# Patient Record
Sex: Female | Born: 1945
Health system: Southern US, Community
[De-identification: ages and names within clinical notes are randomized; demographics above are authoritative.]

## PROBLEM LIST (undated history)

## (undated) DIAGNOSIS — I2510 Atherosclerotic heart disease of native coronary artery without angina pectoris: Secondary | ICD-10-CM

## (undated) DIAGNOSIS — M1990 Unspecified osteoarthritis, unspecified site: Secondary | ICD-10-CM

## (undated) DIAGNOSIS — M51369 Other intervertebral disc degeneration, lumbar region without mention of lumbar back pain or lower extremity pain: Secondary | ICD-10-CM

## (undated) DIAGNOSIS — K222 Esophageal obstruction: Secondary | ICD-10-CM

## (undated) DIAGNOSIS — K449 Diaphragmatic hernia without obstruction or gangrene: Secondary | ICD-10-CM

## (undated) DIAGNOSIS — H269 Unspecified cataract: Secondary | ICD-10-CM

## (undated) DIAGNOSIS — D497 Neoplasm of unspecified behavior of endocrine glands and other parts of nervous system: Secondary | ICD-10-CM

## (undated) DIAGNOSIS — M5136 Other intervertebral disc degeneration, lumbar region: Secondary | ICD-10-CM

## (undated) DIAGNOSIS — S0285XA Fracture of orbit, unspecified, initial encounter for closed fracture: Secondary | ICD-10-CM

## (undated) DIAGNOSIS — E559 Vitamin D deficiency, unspecified: Secondary | ICD-10-CM

## (undated) DIAGNOSIS — F419 Anxiety disorder, unspecified: Secondary | ICD-10-CM

## (undated) DIAGNOSIS — M549 Dorsalgia, unspecified: Secondary | ICD-10-CM

## (undated) DIAGNOSIS — F32A Depression, unspecified: Secondary | ICD-10-CM

## (undated) DIAGNOSIS — K5790 Diverticulosis of intestine, part unspecified, without perforation or abscess without bleeding: Secondary | ICD-10-CM

## (undated) DIAGNOSIS — M8580 Other specified disorders of bone density and structure, unspecified site: Secondary | ICD-10-CM

## (undated) DIAGNOSIS — E785 Hyperlipidemia, unspecified: Secondary | ICD-10-CM

## (undated) DIAGNOSIS — C439 Malignant melanoma of skin, unspecified: Secondary | ICD-10-CM

## (undated) DIAGNOSIS — M5030 Other cervical disc degeneration, unspecified cervical region: Secondary | ICD-10-CM

## (undated) DIAGNOSIS — K219 Gastro-esophageal reflux disease without esophagitis: Secondary | ICD-10-CM

## (undated) DIAGNOSIS — R519 Headache, unspecified: Secondary | ICD-10-CM

## (undated) DIAGNOSIS — I10 Essential (primary) hypertension: Secondary | ICD-10-CM

## (undated) DIAGNOSIS — K649 Unspecified hemorrhoids: Secondary | ICD-10-CM

## (undated) HISTORY — DX: Other intervertebral disc degeneration, lumbar region without mention of lumbar back pain or lower extremity pain: M51.369

## (undated) HISTORY — DX: Diverticulosis of intestine, part unspecified, without perforation or abscess without bleeding: K57.90

## (undated) HISTORY — DX: Unspecified hemorrhoids: K64.9

## (undated) HISTORY — DX: Hyperlipidemia, unspecified: E78.5

## (undated) HISTORY — PX: FOOT SURGERY: SHX648

## (undated) HISTORY — DX: Essential (primary) hypertension: I10

## (undated) HISTORY — DX: Dorsalgia, unspecified: M54.9

## (undated) HISTORY — DX: Headache, unspecified: R51.9

## (undated) HISTORY — DX: Fracture of orbit, unspecified, initial encounter for closed fracture: S02.85XA

## (undated) HISTORY — DX: Neoplasm of unspecified behavior of endocrine glands and other parts of nervous system: D49.7

## (undated) HISTORY — DX: Gastro-esophageal reflux disease without esophagitis: K21.9

## (undated) HISTORY — DX: Anxiety disorder, unspecified: F41.9

## (undated) HISTORY — PX: OTHER SURGICAL HISTORY: SHX169

## (undated) HISTORY — DX: Diaphragmatic hernia without obstruction or gangrene: K44.9

## (undated) HISTORY — DX: Depression, unspecified: F32.A

## (undated) HISTORY — DX: Unspecified cataract: H26.9

## (undated) HISTORY — DX: Other intervertebral disc degeneration, lumbar region: M51.36

## (undated) HISTORY — DX: Malignant melanoma of skin, unspecified: C43.9

## (undated) HISTORY — DX: Other cervical disc degeneration, unspecified cervical region: M50.30

## (undated) HISTORY — DX: Esophageal obstruction: K22.2

## (undated) HISTORY — DX: Atherosclerotic heart disease of native coronary artery without angina pectoris: I25.10

## (undated) HISTORY — PX: TUBAL LIGATION: SHX77

## (undated) HISTORY — DX: Unspecified osteoarthritis, unspecified site: M19.90

## (undated) HISTORY — DX: Vitamin D deficiency, unspecified: E55.9

## (undated) HISTORY — DX: Other specified disorders of bone density and structure, unspecified site: M85.80

## (undated) HISTORY — PX: CERVICAL DISC SURGERY: SHX588

## (undated) HISTORY — PX: COLONOSCOPY: SHX174

---

## 1991-03-20 HISTORY — PX: VAGINAL HYSTERECTOMY: SUR661

## 1997-07-02 ENCOUNTER — Ambulatory Visit (HOSPITAL_COMMUNITY): Admission: RE | Admit: 1997-07-02 | Discharge: 1997-07-02 | Payer: Self-pay | Admitting: Neurosurgery

## 1997-08-04 ENCOUNTER — Other Ambulatory Visit: Admission: RE | Admit: 1997-08-04 | Discharge: 1997-08-04 | Payer: Self-pay | Admitting: Obstetrics and Gynecology

## 1997-08-25 ENCOUNTER — Ambulatory Visit (HOSPITAL_COMMUNITY): Admission: RE | Admit: 1997-08-25 | Discharge: 1997-08-25 | Payer: Self-pay | Admitting: Obstetrics and Gynecology

## 1998-01-20 ENCOUNTER — Encounter: Payer: Self-pay | Admitting: Neurosurgery

## 1998-01-20 ENCOUNTER — Ambulatory Visit (HOSPITAL_COMMUNITY): Admission: RE | Admit: 1998-01-20 | Discharge: 1998-01-20 | Payer: Self-pay | Admitting: Neurosurgery

## 1998-02-09 ENCOUNTER — Encounter: Payer: Self-pay | Admitting: Neurosurgery

## 1998-02-09 ENCOUNTER — Ambulatory Visit (HOSPITAL_COMMUNITY): Admission: RE | Admit: 1998-02-09 | Discharge: 1998-02-09 | Payer: Self-pay | Admitting: Neurosurgery

## 1998-02-24 ENCOUNTER — Encounter: Payer: Self-pay | Admitting: Neurosurgery

## 1998-02-24 ENCOUNTER — Ambulatory Visit (HOSPITAL_COMMUNITY): Admission: RE | Admit: 1998-02-24 | Discharge: 1998-02-24 | Payer: Self-pay | Admitting: Neurosurgery

## 1998-03-10 ENCOUNTER — Encounter: Payer: Self-pay | Admitting: Neurosurgery

## 1998-03-10 ENCOUNTER — Ambulatory Visit (HOSPITAL_COMMUNITY): Admission: RE | Admit: 1998-03-10 | Discharge: 1998-03-10 | Payer: Self-pay | Admitting: Neurosurgery

## 1998-08-18 ENCOUNTER — Encounter: Payer: Self-pay | Admitting: Neurosurgery

## 1998-08-18 ENCOUNTER — Ambulatory Visit (HOSPITAL_COMMUNITY): Admission: RE | Admit: 1998-08-18 | Discharge: 1998-08-18 | Payer: Self-pay | Admitting: Neurosurgery

## 1998-09-22 ENCOUNTER — Other Ambulatory Visit: Admission: RE | Admit: 1998-09-22 | Discharge: 1998-09-22 | Payer: Self-pay | Admitting: Obstetrics and Gynecology

## 1998-09-30 ENCOUNTER — Ambulatory Visit (HOSPITAL_COMMUNITY): Admission: RE | Admit: 1998-09-30 | Discharge: 1998-09-30 | Payer: Self-pay | Admitting: Obstetrics and Gynecology

## 1998-09-30 ENCOUNTER — Encounter: Payer: Self-pay | Admitting: Obstetrics and Gynecology

## 1999-06-11 ENCOUNTER — Encounter: Payer: Self-pay | Admitting: Neurosurgery

## 1999-06-11 ENCOUNTER — Ambulatory Visit (HOSPITAL_COMMUNITY): Admission: RE | Admit: 1999-06-11 | Discharge: 1999-06-11 | Payer: Self-pay | Admitting: Neurosurgery

## 1999-09-26 ENCOUNTER — Other Ambulatory Visit: Admission: RE | Admit: 1999-09-26 | Discharge: 1999-09-26 | Payer: Self-pay | Admitting: Obstetrics and Gynecology

## 1999-10-06 ENCOUNTER — Encounter: Payer: Self-pay | Admitting: Obstetrics and Gynecology

## 1999-10-06 ENCOUNTER — Ambulatory Visit (HOSPITAL_COMMUNITY): Admission: RE | Admit: 1999-10-06 | Discharge: 1999-10-06 | Payer: Self-pay | Admitting: Obstetrics and Gynecology

## 1999-12-12 ENCOUNTER — Ambulatory Visit (HOSPITAL_COMMUNITY): Admission: RE | Admit: 1999-12-12 | Discharge: 1999-12-12 | Payer: Self-pay | Admitting: *Deleted

## 2000-07-03 ENCOUNTER — Ambulatory Visit (HOSPITAL_COMMUNITY): Admission: RE | Admit: 2000-07-03 | Discharge: 2000-07-03 | Payer: Self-pay | Admitting: Neurosurgery

## 2000-07-03 ENCOUNTER — Encounter: Payer: Self-pay | Admitting: Neurosurgery

## 2000-07-09 ENCOUNTER — Ambulatory Visit (HOSPITAL_COMMUNITY): Admission: RE | Admit: 2000-07-09 | Discharge: 2000-07-09 | Payer: Self-pay | Admitting: Gastroenterology

## 2000-07-09 ENCOUNTER — Encounter: Payer: Self-pay | Admitting: Gastroenterology

## 2000-10-15 ENCOUNTER — Other Ambulatory Visit: Admission: RE | Admit: 2000-10-15 | Discharge: 2000-10-15 | Payer: Self-pay | Admitting: Obstetrics and Gynecology

## 2000-12-02 ENCOUNTER — Encounter: Payer: Self-pay | Admitting: Neurosurgery

## 2000-12-02 ENCOUNTER — Ambulatory Visit (HOSPITAL_COMMUNITY): Admission: RE | Admit: 2000-12-02 | Discharge: 2000-12-02 | Payer: Self-pay | Admitting: Neurosurgery

## 2001-01-23 ENCOUNTER — Ambulatory Visit (HOSPITAL_COMMUNITY): Admission: RE | Admit: 2001-01-23 | Discharge: 2001-01-23 | Payer: Self-pay | Admitting: Gastroenterology

## 2001-06-29 ENCOUNTER — Ambulatory Visit (HOSPITAL_COMMUNITY): Admission: RE | Admit: 2001-06-29 | Discharge: 2001-06-29 | Payer: Self-pay | Admitting: Neurosurgery

## 2001-06-29 ENCOUNTER — Encounter: Payer: Self-pay | Admitting: Neurosurgery

## 2001-09-04 ENCOUNTER — Encounter: Payer: Self-pay | Admitting: Gastroenterology

## 2001-09-04 ENCOUNTER — Ambulatory Visit (HOSPITAL_COMMUNITY): Admission: RE | Admit: 2001-09-04 | Discharge: 2001-09-04 | Payer: Self-pay | Admitting: Gastroenterology

## 2001-10-15 ENCOUNTER — Other Ambulatory Visit: Admission: RE | Admit: 2001-10-15 | Discharge: 2001-10-15 | Payer: Self-pay | Admitting: Obstetrics and Gynecology

## 2002-02-09 ENCOUNTER — Ambulatory Visit (HOSPITAL_COMMUNITY): Admission: RE | Admit: 2002-02-09 | Discharge: 2002-02-09 | Payer: Self-pay | Admitting: Gastroenterology

## 2002-02-09 ENCOUNTER — Encounter: Payer: Self-pay | Admitting: Gastroenterology

## 2002-02-13 ENCOUNTER — Ambulatory Visit (HOSPITAL_COMMUNITY): Admission: RE | Admit: 2002-02-13 | Discharge: 2002-02-13 | Payer: Self-pay | Admitting: Gastroenterology

## 2002-02-13 ENCOUNTER — Encounter: Payer: Self-pay | Admitting: Gastroenterology

## 2002-05-01 ENCOUNTER — Ambulatory Visit (HOSPITAL_COMMUNITY): Admission: RE | Admit: 2002-05-01 | Discharge: 2002-05-01 | Payer: Self-pay | Admitting: Neurosurgery

## 2002-05-01 ENCOUNTER — Encounter: Payer: Self-pay | Admitting: Neurosurgery

## 2002-06-24 ENCOUNTER — Encounter: Payer: Self-pay | Admitting: Neurosurgery

## 2002-06-24 ENCOUNTER — Ambulatory Visit (HOSPITAL_COMMUNITY): Admission: RE | Admit: 2002-06-24 | Discharge: 2002-06-24 | Payer: Self-pay | Admitting: Neurosurgery

## 2003-02-03 ENCOUNTER — Ambulatory Visit (HOSPITAL_COMMUNITY): Admission: RE | Admit: 2003-02-03 | Discharge: 2003-02-03 | Payer: Self-pay | Admitting: Obstetrics and Gynecology

## 2003-04-24 ENCOUNTER — Ambulatory Visit (HOSPITAL_COMMUNITY): Admission: RE | Admit: 2003-04-24 | Discharge: 2003-04-24 | Payer: Self-pay | Admitting: Neurosurgery

## 2003-05-26 ENCOUNTER — Emergency Department (HOSPITAL_COMMUNITY): Admission: EM | Admit: 2003-05-26 | Discharge: 2003-05-26 | Payer: Self-pay | Admitting: Emergency Medicine

## 2003-06-22 ENCOUNTER — Ambulatory Visit (HOSPITAL_COMMUNITY): Admission: RE | Admit: 2003-06-22 | Discharge: 2003-06-22 | Payer: Self-pay | Admitting: Neurosurgery

## 2004-01-17 ENCOUNTER — Other Ambulatory Visit: Admission: RE | Admit: 2004-01-17 | Discharge: 2004-01-17 | Payer: Self-pay | Admitting: Obstetrics and Gynecology

## 2004-02-07 ENCOUNTER — Ambulatory Visit: Payer: Self-pay | Admitting: Gastroenterology

## 2004-05-29 ENCOUNTER — Ambulatory Visit (HOSPITAL_COMMUNITY): Admission: RE | Admit: 2004-05-29 | Discharge: 2004-05-29 | Payer: Self-pay | Admitting: Neurosurgery

## 2004-12-20 ENCOUNTER — Ambulatory Visit (HOSPITAL_COMMUNITY): Admission: RE | Admit: 2004-12-20 | Discharge: 2004-12-20 | Payer: Self-pay | Admitting: Obstetrics and Gynecology

## 2005-02-22 ENCOUNTER — Other Ambulatory Visit: Admission: RE | Admit: 2005-02-22 | Discharge: 2005-02-22 | Payer: Self-pay | Admitting: Obstetrics and Gynecology

## 2005-03-23 ENCOUNTER — Ambulatory Visit: Payer: Self-pay | Admitting: Internal Medicine

## 2005-03-30 ENCOUNTER — Ambulatory Visit: Payer: Self-pay

## 2005-05-07 ENCOUNTER — Ambulatory Visit: Payer: Self-pay | Admitting: Gastroenterology

## 2005-05-09 ENCOUNTER — Encounter (INDEPENDENT_AMBULATORY_CARE_PROVIDER_SITE_OTHER): Payer: Self-pay | Admitting: Specialist

## 2005-05-09 ENCOUNTER — Ambulatory Visit (HOSPITAL_BASED_OUTPATIENT_CLINIC_OR_DEPARTMENT_OTHER): Admission: RE | Admit: 2005-05-09 | Discharge: 2005-05-09 | Payer: Self-pay | Admitting: Orthopedic Surgery

## 2005-05-14 ENCOUNTER — Ambulatory Visit: Payer: Self-pay | Admitting: Gastroenterology

## 2005-05-23 ENCOUNTER — Encounter: Admission: RE | Admit: 2005-05-23 | Discharge: 2005-05-23 | Payer: Self-pay | Admitting: Neurosurgery

## 2005-09-18 ENCOUNTER — Ambulatory Visit (HOSPITAL_BASED_OUTPATIENT_CLINIC_OR_DEPARTMENT_OTHER): Admission: RE | Admit: 2005-09-18 | Discharge: 2005-09-18 | Payer: Self-pay | Admitting: Orthopedic Surgery

## 2005-09-18 ENCOUNTER — Encounter (INDEPENDENT_AMBULATORY_CARE_PROVIDER_SITE_OTHER): Payer: Self-pay | Admitting: *Deleted

## 2006-01-10 ENCOUNTER — Encounter: Admission: RE | Admit: 2006-01-10 | Discharge: 2006-01-10 | Payer: Self-pay | Admitting: Neurosurgery

## 2006-02-27 ENCOUNTER — Other Ambulatory Visit: Admission: RE | Admit: 2006-02-27 | Discharge: 2006-02-27 | Payer: Self-pay | Admitting: Obstetrics and Gynecology

## 2006-06-14 ENCOUNTER — Encounter: Admission: RE | Admit: 2006-06-14 | Discharge: 2006-06-14 | Payer: Self-pay | Admitting: Neurosurgery

## 2006-09-16 ENCOUNTER — Ambulatory Visit: Payer: Self-pay | Admitting: Gastroenterology

## 2007-04-09 ENCOUNTER — Other Ambulatory Visit: Admission: RE | Admit: 2007-04-09 | Discharge: 2007-04-09 | Payer: Self-pay | Admitting: Obstetrics and Gynecology

## 2007-04-18 ENCOUNTER — Ambulatory Visit (HOSPITAL_COMMUNITY): Admission: RE | Admit: 2007-04-18 | Discharge: 2007-04-18 | Payer: Self-pay | Admitting: Obstetrics and Gynecology

## 2007-06-20 ENCOUNTER — Encounter: Admission: RE | Admit: 2007-06-20 | Discharge: 2007-06-20 | Payer: Self-pay | Admitting: Neurosurgery

## 2008-03-01 ENCOUNTER — Encounter: Admission: RE | Admit: 2008-03-01 | Discharge: 2008-03-01 | Payer: Self-pay | Admitting: Neurosurgery

## 2008-05-17 ENCOUNTER — Ambulatory Visit: Payer: Self-pay | Admitting: Obstetrics and Gynecology

## 2008-05-17 ENCOUNTER — Other Ambulatory Visit: Admission: RE | Admit: 2008-05-17 | Discharge: 2008-05-17 | Payer: Self-pay | Admitting: Obstetrics and Gynecology

## 2008-05-17 ENCOUNTER — Encounter: Payer: Self-pay | Admitting: Obstetrics and Gynecology

## 2008-11-10 ENCOUNTER — Telehealth: Payer: Self-pay | Admitting: Gastroenterology

## 2008-11-12 ENCOUNTER — Telehealth: Payer: Self-pay | Admitting: Gastroenterology

## 2009-01-11 ENCOUNTER — Ambulatory Visit: Payer: Self-pay | Admitting: Obstetrics and Gynecology

## 2009-01-11 ENCOUNTER — Ambulatory Visit: Payer: Self-pay | Admitting: Gastroenterology

## 2009-01-11 DIAGNOSIS — K219 Gastro-esophageal reflux disease without esophagitis: Secondary | ICD-10-CM

## 2009-01-11 HISTORY — DX: Gastro-esophageal reflux disease without esophagitis: K21.9

## 2009-04-19 ENCOUNTER — Encounter: Admission: RE | Admit: 2009-04-19 | Discharge: 2009-04-19 | Payer: Self-pay | Admitting: Neurosurgery

## 2009-07-12 ENCOUNTER — Ambulatory Visit: Payer: Self-pay | Admitting: Obstetrics and Gynecology

## 2009-07-12 ENCOUNTER — Other Ambulatory Visit: Admission: RE | Admit: 2009-07-12 | Discharge: 2009-07-12 | Payer: Self-pay | Admitting: Obstetrics and Gynecology

## 2009-12-29 ENCOUNTER — Ambulatory Visit (HOSPITAL_COMMUNITY): Admission: RE | Admit: 2009-12-29 | Discharge: 2009-12-29 | Payer: Self-pay | Admitting: Obstetrics and Gynecology

## 2010-01-12 ENCOUNTER — Encounter: Admission: RE | Admit: 2010-01-12 | Discharge: 2010-01-12 | Payer: Self-pay | Admitting: Neurosurgery

## 2010-03-19 HISTORY — PX: PELVIC LAPAROSCOPY: SHX162

## 2010-03-19 HISTORY — PX: OOPHORECTOMY: SHX86

## 2010-07-11 ENCOUNTER — Telehealth: Payer: Self-pay | Admitting: Gastroenterology

## 2010-07-11 ENCOUNTER — Other Ambulatory Visit: Payer: 59

## 2010-07-11 ENCOUNTER — Ambulatory Visit (INDEPENDENT_AMBULATORY_CARE_PROVIDER_SITE_OTHER): Payer: 59 | Admitting: Obstetrics and Gynecology

## 2010-07-11 DIAGNOSIS — D3910 Neoplasm of uncertain behavior of unspecified ovary: Secondary | ICD-10-CM

## 2010-07-11 DIAGNOSIS — R1032 Left lower quadrant pain: Secondary | ICD-10-CM

## 2010-07-11 DIAGNOSIS — N7013 Chronic salpingitis and oophoritis: Secondary | ICD-10-CM

## 2010-07-11 DIAGNOSIS — R1900 Intra-abdominal and pelvic swelling, mass and lump, unspecified site: Secondary | ICD-10-CM

## 2010-07-11 NOTE — Telephone Encounter (Signed)
Pt states that she saw her GYN today and he suggested that she have a colonoscopy. Pt states that she had an ultrasound done and they saw a "wall" and thought she should have the colon. Does the pt need to be scheduled for a direct colon or does she need an OV. Dr. Arlyce Dice please advise.

## 2010-07-18 NOTE — Telephone Encounter (Signed)
OV would be best 

## 2010-07-20 ENCOUNTER — Other Ambulatory Visit: Payer: 59

## 2010-07-20 ENCOUNTER — Ambulatory Visit (INDEPENDENT_AMBULATORY_CARE_PROVIDER_SITE_OTHER): Payer: Medicare Other | Admitting: Obstetrics and Gynecology

## 2010-07-20 DIAGNOSIS — R1904 Left lower quadrant abdominal swelling, mass and lump: Secondary | ICD-10-CM

## 2010-07-20 DIAGNOSIS — R1903 Right lower quadrant abdominal swelling, mass and lump: Secondary | ICD-10-CM

## 2010-07-20 DIAGNOSIS — N7013 Chronic salpingitis and oophoritis: Secondary | ICD-10-CM

## 2010-08-04 NOTE — Op Note (Signed)
NAMEPAOLA, ALESHIRE               ACCOUNT NO.:  [id number]   MEDICAL RECORD NO.:  [id number]          PATIENT TYPE:  AMB   LOCATION:  [facility]                         FACILITY:  [facility]   PHYSICIAN:  Marlowe Kays, M.D.  DATE OF BIRTH:  09-25-1945   DATE OF PROCEDURE:  09/18/2005  DATE OF DISCHARGE:                                 OPERATIVE REPORT   PREOPERATIVE DIAGNOSIS:  Recurrent ganglion, ulnar aspect, left wrist.   POSTOPERATIVE DIAGNOSIS:  Recurrent ganglion, ulnar aspect, left wrist.   OPERATION:  Excision of recurring ganglion, ulnar left wrist.   SURGEON:  Marlowe Kays, M.D.   ASSISTANT:  Nurse.   ANESTHESIA:  General.   PATHOLOGY AND JUSTIFICATION FOR PROCEDURE:  I had excised a mass in this  area on 05/09/2005.  At the time it did not have definite characteristics of  a ganglion and looked more like a seroma on top of a neuroma; however, the  path report came back ganglion cyst.  She has had a large recurrence,  confirmed by MRI with no definite stalk going into the joint.  Accordingly,  she is here today for surgical excision.   PROCEDURE:  Pneumatic tourniquet, left upper extremity was esmarched out  nonsterilely at 250 mmHg and the left upper extremity prepped from  midforearm to fingertips with DuraPrep and draped in sterile field.  I went  through the old surgical incision.  A large very characteristic looking  ganglion cyst this time was noted right below the subcutaneous tissue and  was carefully dissected out from surrounding tissues and subcutaneous tissue  dorsally was the dorsal sensory branch of the ulnar nerve which I identified  and protected.  Beneath the was a large cyst I protected the neurovascular  structures.  The cyst was gradually dissected out and excised at its base  and sent to pathology.  There appeared to be too apertures at 45 degrees to  one another, one going distally into intercarpal area and one more proximal  in probably what was  ulnar carpal joint.  After irrigating the wound well,  I sealed both these off tightly with interrupted 3-0 Vicryl.  I then used  the same suture for subcutaneous tissue with interrupted 4-0 nylon mattress  sutures in the skin.  The wound was irrigated and infiltrated with half  percent plain Marcaine prior to closure.  I then dressed it with Betadine  Adaptic and bulky compressive dressing with volar and dorsal splints.  The  tourniquet was released at the time of dressing application.  All fingers  were pink.  No operative complications.           ______________________________  Marlowe Kays, M.D.     JA/MEDQ  D:  09/18/2005  T:  09/18/2005  Job:  [id number]

## 2010-08-04 NOTE — Op Note (Signed)
Amanda [name], Amanda [name]               ACCOUNT NO.:  [id number]   MEDICAL RECORD NO.:  [id number]          PATIENT TYPE:  AMB   LOCATION:  [facility]                         FACILITY:  [facility]   PHYSICIAN:  Marlowe Kays, M.D.  DATE OF BIRTH:  [birth year]   DATE OF PROCEDURE:  05/09/2005  DATE OF DISCHARGE:                                 OPERATIVE REPORT   PREOPERATIVE DIAGNOSIS:  Suspected ganglion cyst ulnar left wrist.   POSTOPERATIVE DIAGNOSIS:  Mass ulnar left wrist, possible neuroma.   OPERATION:  Exploration left wrist with  partial excision of neuroma.   SURGEON:  Marlowe Kays, M.D.   ASSISTANT:  Nurse.   ANESTHESIA:  IV regional.   PATHOLOGY AND JUSTIFICATION FOR PROCEDURE:  She has a history of a wrist  fracture 2 years ago. Recently she has noted a knot on the ulnar aspect of  her left wrist. X-rays have been normal. I tried to aspirate removing a  little bit of gelatinous material but the mass has somewhat fluctuated in  size but has always ranged in the region of 1.5 to 3 cm and because of its  persistence she is here today for above mentioned surgery. See operative  description below for additional details.   DESCRIPTION OF PROCEDURE:  Satisfactory IV regional anesthesia, DuraPrep  from mid forearm to fingertips and was draped in a sterile field. I made a  midline incision over the ulnar carpal joint centered at the mass. This  should have been well away from both the ulnar nerve proper and the dorsal  sensory branch. Once through the subcutaneous, I was able to with a  combination of blunt and sharp dissection to go down to the mass. There  appeared to be a seroma on top of it with a lot of reactive tissue. There  was a band which entered into the mass and then went distally into the  lateral hand. Traction on this band could be seen to move up into the ulnar  forearm on the lateral surface. This appeared to have consistency of nerve  tissue. The congealed mass present  at the site of the lump I then opened  slightly with a knife blade, a little fluid came forth but the tissue  appeared to be solid. Accordingly, I felt that the best course would be to  debulk the neuroma preserving the ends on either side of the mass and await  the pathology report. There did not appear to be any definite communication  with the joint to close over. I infiltrated the soft tissues with 0.5% plain  Marcaine and then closed the subcutaneous tissue with interrupted 4-0 Vicryl  and skin with running 4-0 nylon. Betadine Adaptic dry sterile dressing and a  volar splint were applied. The tourniquet was released and at the time of  this dictation she was on her way to the recovery room in satisfactory  condition with no known complications.           ______________________________  Marlowe Kays, M.D.    JA/MEDQ  D:  05/09/2005  T:  05/10/2005  Job:  [id number] 

## 2010-08-21 ENCOUNTER — Encounter: Payer: Self-pay | Admitting: Gastroenterology

## 2010-08-21 ENCOUNTER — Ambulatory Visit (INDEPENDENT_AMBULATORY_CARE_PROVIDER_SITE_OTHER): Payer: 59 | Admitting: Gastroenterology

## 2010-08-21 DIAGNOSIS — R1310 Dysphagia, unspecified: Secondary | ICD-10-CM | POA: Insufficient documentation

## 2010-08-21 DIAGNOSIS — Z800 Family history of malignant neoplasm of digestive organs: Secondary | ICD-10-CM

## 2010-08-21 DIAGNOSIS — K219 Gastro-esophageal reflux disease without esophagitis: Secondary | ICD-10-CM

## 2010-08-21 HISTORY — DX: Family history of malignant neoplasm of digestive organs: Z80.0

## 2010-08-21 HISTORY — DX: Dysphagia, unspecified: R13.10

## 2010-08-21 NOTE — Assessment & Plan Note (Addendum)
Plan colonoscopy now and every five-year

## 2010-08-21 NOTE — Patient Instructions (Signed)
Colonoscopy A colonoscopy is an exam to evaluate your entire colon. In this exam, your colon is cleansed. A long fiberoptic tube is inserted through your rectum and into your colon. The fiberoptic scope (endoscope) is a long bundle of enclosed and very flexible fibers. These fibers transmit light to the area examined and send images from that area to your caregiver. Discomfort is usually minimal. You may be given a drug to help you sleep (sedative) during or prior to the procedure. This exam helps to detect lumps (tumors), polyps, inflammation, and areas of bleeding. Your caregiver may also take a small piece of tissue (biopsy) that will be examined under a microscope. BEFORE THE PROCEDURE  A clear liquid diet may be required for 2 days before the exam.   Liquid injections (enemas) or laxatives may be required.   A large amount of electrolyte solution may be given to you to drink over a short period of time. This solution is used to clean out your colon.   You should be present 1 prior to your procedure or as directed by your caregiver.   Check in at the admissions desk to fill out necessary forms if not preregistered. There will be consent forms to sign prior to the procedure. If accompanied by friends or family, there is a waiting area for them while you are having your procedure.  LET YOUR CAREGIVER KNOW ABOUT:  Allergies to food or medicine.  Medicines taken, including vitamins, herbs, eyedrops, over-the-counter medicines, and creams.   Use of steroids (by mouth or creams).   Previous problems with anesthetics or numbing medicines.   History of bleeding problems or blood clots.  Previous surgery.   Other health problems, including diabetes and kidney problems.   Possibility of pregnancy, if this applies.   AFTER THE PROCEDURE  If you received a sedative and/or pain medicine, you will need to arrange for someone to drive you home.   Occasionally, there is a little blood passed  with the first bowel movement. DO NOT be concerned.  HOME CARE INSTRUCTIONS  It is not unusual to pass moderate amounts of gas and experience mild abdominal cramping following the procedure. This is due to air being used to inflate your colon during the exam. Walking or a warm pack on your belly (abdomen) may help.   You may resume all normal meals and activities after sedatives and medicines have worn off.   Only take over-the-counter or prescription medicines for pain, discomfort, or fever as directed by your caregiver. DO NOT use aspirin or blood thinners if a biopsy was taken. Consult your caregiver for medicine usage if biopsies were taken.  FINDING OUT THE RESULTS OF YOUR TEST Not all test results are available during your visit. If your test results are not back during the visit, make an appointment with your caregiver to find out the results. Do not assume everything is normal if you have not heard from your caregiver or the medical facility. It is important for you to follow up on all of your test results. SEEK IMMEDIATE MEDICAL CARE IF:    You pass large blood clots or fill a toilet with blood following the procedure. This may also occur 10 to 14 days following the procedure. This is more likely if a biopsy was taken.   You develop abdominal pain that keeps getting worse and cannot be relieved with medicine.  Document Released: 03/02/2000 Document Re-Released: 05/30/2009 [facility] Patient Information 2011 [place], [place]. Please call back to schedule  your colonoscopy as recommended by Dr Lindell Spar will be scheduled for a  Previsit with a nurse prior to your procedure

## 2010-08-21 NOTE — Progress Notes (Signed)
History of Present Illness:  Mrs. Belfield has returned for followup of her reflux and stricture.  She has a history of an esophageal stricture.  She denies dysphagia. She remains on omeprazole. Since her last visit her sister developed and died from colon cancer. Last colonoscopy was 2002. She was having some left lower quadrant discomfort and was found to have ovarian cysts. She is scheduled for a hysterectomy and oophorectomy. She denies change in bowel habits, melena or hematochezia. Family history is also positive for polyps in her brother and sister.    Review of Systems: Pertinent positive and negative review of systems were noted in the above HPI section. All other review of systems were otherwise negative.    Current Medications, Allergies, Past Medical History, Past Surgical History, Family History and Social History were reviewed in [employer] electronic medical record  Vital signs were reviewed in today's medical record. Physical Exam: General: Well developed , well nourished, no acute distress Head: Normocephalic and atraumatic Eyes:  sclerae anicteric, EOMI Ears: Normal auditory acuity Mouth: No deformity or lesions Lungs: Clear throughout to auscultation Heart: Regular rate and rhythm; no murmurs, rubs or bruits Abdomen: Soft, non tender and non distended. No masses, hepatosplenomegaly or hernias noted. Normal Bowel sounds Rectal:deferred Musculoskeletal: Symmetrical with no gross deformities  Pulses:  Normal pulses noted Extremities: No clubbing, cyanosis, edema or deformities noted Neurological: Alert oriented x 4, grossly nonfocal Psychological:  Alert and cooperative. Normal mood and affect

## 2010-08-21 NOTE — Assessment & Plan Note (Signed)
Symptoms are well controlled with omeprazole.

## 2010-09-21 ENCOUNTER — Ambulatory Visit (AMBULATORY_SURGERY_CENTER): Payer: 59 | Admitting: *Deleted

## 2010-09-21 VITALS — Ht 65.0 in | Wt 158.6 lb

## 2010-09-21 DIAGNOSIS — Z800 Family history of malignant neoplasm of digestive organs: Secondary | ICD-10-CM

## 2010-09-21 MED ORDER — MOVIPREP 100 G PO SOLR: ORAL | Status: DC

## 2010-10-04 ENCOUNTER — Ambulatory Visit (AMBULATORY_SURGERY_CENTER): Payer: 59 | Admitting: Gastroenterology

## 2010-10-04 ENCOUNTER — Encounter: Payer: Self-pay | Admitting: Gastroenterology

## 2010-10-04 DIAGNOSIS — K5730 Diverticulosis of large intestine without perforation or abscess without bleeding: Secondary | ICD-10-CM

## 2010-10-04 DIAGNOSIS — Z800 Family history of malignant neoplasm of digestive organs: Secondary | ICD-10-CM

## 2010-10-04 DIAGNOSIS — Z1211 Encounter for screening for malignant neoplasm of colon: Secondary | ICD-10-CM

## 2010-10-04 MED ORDER — SODIUM CHLORIDE 0.9 % IV SOLN: 500.0000 mL | INTRAVENOUS | Status: DC

## 2010-10-04 NOTE — Patient Instructions (Addendum)
Diverticulosis Diverticulosis is a common condition that develops when small pouches (diverticula) form in the wall of the colon. The risk of diverticulosis increases with age. It happens more often in people who eat a low-fiber diet. Most individuals with diverticulosis have no symptoms. Those individuals with symptoms usually experience belly (abdominal) pain, constipation, or loose stools (diarrhea). HOME CARE INSTRUCTIONS  Increase the amount of fiber in your diet as directed by your caregiver or dietician. This may reduce symptoms of diverticulosis.   Your caregiver may recommend taking a dietary fiber supplement.   Drink at least 6 to 8 glasses of water each day to prevent constipation.   Try not to strain when you have a bowel movement.   Your caregiver may recommend avoiding nuts and seeds to prevent complications, although this is still an uncertain benefit.   Only take over-the-counter or prescription medicines for pain, discomfort, or fever as directed by your caregiver.  FOODS HAVING HIGH FIBER CONTENT INCLUDE:  Fruits. Apple, peach, pear, tangerine, raisins, prunes.   Vegetables. Brussels sprouts, asparagus, broccoli, cabbage, carrot, cauliflower, romaine lettuce, spinach, summer squash, tomato, winter squash, zucchini.   Starchy Vegetables. Baked beans, kidney beans, lima beans, split peas, lentils, potatoes (with skin).   Grains. Whole wheat bread, brown rice, bran flake cereal, plain oatmeal, white rice, shredded wheat, bran muffins.  SEEK IMMEDIATE MEDICAL CARE IF:  You develop increasing pain or severe bloating.   You have an oral temperature above 100, not controlled by medicine.   You develop vomiting or bowel movements that are bloody or black.  Document Released: 12/01/2003 Document Re-Released: 08/23/2009 [facility] Patient Information 2011 [place], [place].  Please review all discharge papers given to you by the recovery room nurse.  Mild diverticulosis in the  sigmoid colon was found by Dr Arlyce Dice today. He recommends a repeat colonoscopy in 5 years.  If you have problems after discharge please call [phone].  One of our nurses will call you in the am to see how you are doing and answer any questions you may have.  Thank you.

## 2010-10-05 ENCOUNTER — Telehealth: Payer: Self-pay | Admitting: *Deleted

## 2010-10-05 NOTE — Telephone Encounter (Signed)
No ID on voice mail.   No message left. 

## 2010-10-09 ENCOUNTER — Telehealth: Payer: Self-pay | Admitting: Obstetrics and Gynecology

## 2010-10-09 NOTE — Telephone Encounter (Addendum)
PATIENT IS READY TO SCHEDULE LAPAROSCOPIC BSO.  I AM ATTACHING OLD SURGERY SHEET AND ORDERS SHEET TO HARD COPY CHART  AS I AM EXPECTING YOU WILL SCHEDULE AT [facility]. ALSO, PATIENT SAID THAT SHE HAD COLONOSCOPY LAST WEEK AND EVERYTHING WAS FINE.   10-10-10 DR. GOTTSEGEN FILLED OUT SURGERY SLIP AND RETURNED IT TO ME. HE DID DICTATE IN DRAGON THAT HE WAS SENDING IT TO ME WITH ORDER BUT IT DID NOT SHOW UP IN MY INBASKET. I  WILL CONTACT PT AND PROCEED WITH SCHEDULING.

## 2010-10-11 ENCOUNTER — Encounter: Payer: Self-pay | Admitting: Obstetrics and Gynecology

## 2010-10-11 ENCOUNTER — Other Ambulatory Visit: Payer: Self-pay | Admitting: Obstetrics and Gynecology

## 2010-10-11 ENCOUNTER — Telehealth: Payer: Self-pay

## 2010-10-11 DIAGNOSIS — Z01818 Encounter for other preprocedural examination: Secondary | ICD-10-CM

## 2010-10-11 NOTE — Telephone Encounter (Signed)
Previously documented--error.

## 2010-10-11 NOTE — Telephone Encounter (Signed)
I called patient and informed her surgery scheduled for Friday, Aug 24 at the [facility].  She will check in 6:15am for 7:30am surgery.  She was instructed to be NPO after midnight on 11/09/10, to have someone to drive her home and to be with her for 24 hours after surgery while she recovers at home. She will come by [facility] the week of surgery on that M, Tu, or W to have her preop labwork done in the office lab. Our lab will fax results to [facility].  Paper orders were faxed to [facility] since they are not yet live with EMR.  [facility]

## 2010-10-27 NOTE — Progress Notes (Signed)
Addended by: Keenan Bachelor on: 10/27/2010 11:15 AM   Modules accepted: Orders

## 2010-11-08 ENCOUNTER — Ambulatory Visit (INDEPENDENT_AMBULATORY_CARE_PROVIDER_SITE_OTHER): Payer: 59 | Admitting: Gynecology

## 2010-11-08 DIAGNOSIS — Z01818 Encounter for other preprocedural examination: Secondary | ICD-10-CM

## 2010-11-08 DIAGNOSIS — Z79899 Other long term (current) drug therapy: Secondary | ICD-10-CM

## 2010-11-08 DIAGNOSIS — R823 Hemoglobinuria: Secondary | ICD-10-CM

## 2010-11-08 LAB — HEPATIC FUNCTION PANEL
ALT: <8 U/L (ref 0–35)
AST: 13 U/L (ref 0–37)
Bilirubin, Direct: 0.1 mg/dL (ref 0.0–0.3)
Indirect Bilirubin: 0.2 mg/dL (ref 0.0–0.9)
Total Bilirubin: 0.3 mg/dL (ref 0.3–1.2)

## 2010-11-10 ENCOUNTER — Other Ambulatory Visit: Payer: Self-pay | Admitting: Obstetrics and Gynecology

## 2010-11-10 ENCOUNTER — Ambulatory Visit (HOSPITAL_BASED_OUTPATIENT_CLINIC_OR_DEPARTMENT_OTHER)
Admission: RE | Admit: 2010-11-10 | Discharge: 2010-11-10 | Disposition: A | Discharge status: Home / Self Care | Payer: 59 | Source: Ambulatory Visit | Attending: Obstetrics and Gynecology | Admitting: Obstetrics and Gynecology

## 2010-11-10 DIAGNOSIS — R1900 Intra-abdominal and pelvic swelling, mass and lump, unspecified site: Secondary | ICD-10-CM

## 2010-11-10 DIAGNOSIS — K219 Gastro-esophageal reflux disease without esophagitis: Secondary | ICD-10-CM | POA: Insufficient documentation

## 2010-11-10 DIAGNOSIS — Z79899 Other long term (current) drug therapy: Secondary | ICD-10-CM | POA: Insufficient documentation

## 2010-11-10 DIAGNOSIS — E559 Vitamin D deficiency, unspecified: Secondary | ICD-10-CM | POA: Insufficient documentation

## 2010-11-10 DIAGNOSIS — N731 Chronic parametritis and pelvic cellulitis: Secondary | ICD-10-CM

## 2010-11-10 DIAGNOSIS — Z01812 Encounter for preprocedural laboratory examination: Secondary | ICD-10-CM | POA: Insufficient documentation

## 2010-11-10 DIAGNOSIS — N736 Female pelvic peritoneal adhesions (postinfective): Secondary | ICD-10-CM

## 2010-11-10 DIAGNOSIS — N7013 Chronic salpingitis and oophoritis: Secondary | ICD-10-CM | POA: Insufficient documentation

## 2010-11-10 DIAGNOSIS — N949 Unspecified condition associated with female genital organs and menstrual cycle: Secondary | ICD-10-CM | POA: Insufficient documentation

## 2010-11-10 DIAGNOSIS — Z01810 Encounter for preprocedural cardiovascular examination: Secondary | ICD-10-CM | POA: Insufficient documentation

## 2010-11-10 DIAGNOSIS — M949 Disorder of cartilage, unspecified: Secondary | ICD-10-CM | POA: Insufficient documentation

## 2010-11-10 DIAGNOSIS — M899 Disorder of bone, unspecified: Secondary | ICD-10-CM | POA: Insufficient documentation

## 2010-11-10 LAB — POCT HEMOGLOBIN-HEMACUE: Hemoglobin: 13.5 g/dL (ref 12.0–15.0)

## 2010-11-14 NOTE — Op Note (Signed)
[name], [name]               ACCOUNT NO.:  [id number]  MEDICAL RECORD NO.:  [id number]  LOCATION:                                 FACILITY:  PHYSICIAN:  Guenevere Roorda L. Elizaveta Mattice, M.D.DATE OF BIRTH:  [birth year]  DATE OF PROCEDURE:  11/10/2010 DATE OF DISCHARGE:                              OPERATIVE REPORT   PREOPERATIVE DIAGNOSES:  Chronic pelvic pain with chronic pelvic inflammatory disease with bilateral hydrosalpinx.  POSTOPERATIVE DIAGNOSES:  Chronic pelvic pain with chronic pelvic inflammatory disease with bilateral hydrosalpinx.  OPERATIONS:  Diagnostic laparoscopy with lyses of pelvic adhesions and bilateral salpingo-oophorectomy.  SURGEON:  Marquasia Schmieder L. Eda Paschal, MD  FIRST ASSISTANTGaetano Hawthorne. Lily Peer, MD  INDICATIONS:  The patient is a 65 year old female who has had repetitive episodes of pelvic inflammatory disease with fever, pain, elevated white count and need for antibiotics.  She persistently has had ultrasounds done showing bilateral hydrosalpinx.  We have continued to treat her conservatively but after her last reoccurrence, which was earlier this year, we have elected to proceed with surgery since she continues to get reinfected.  She now enters the hospital for laparoscopy with bilateral salpingo-oophorectomy.  FINDINGS:  At the time of laparoscopy, the patient had a large right hydrosalpinx of about 6 cm with an attached normal ovary.  The right side was not adherent to any other structures.  On the left side, however, there were dense adhesions involving the left ovary and the left hydrosalpinx to the broad ligament and to the mesentery of the sigmoid colon.  The ileocecal junction was identified.  The appendix could not be seen.  It appeared to probably be retrocecal.  PROCEDURE:  After adequate general anesthesia, the patient was placed in the dorsal lithotomy position, prepped and draped in the usual sterile manner.  A Foley catheter was inserted  into her bladder.  A sponge stick was placed in the vagina.  A subumbilical midline incision was made and through this the 10 mm diagnostic laparoscope, attached to an Optiview, was placed into the peritoneal cavity under direct visualization.  This was done atraumatically.  A pneumoperitoneum was created.  5 mm ports were placed in the right and left lower quadrant.  The next step was to take peritoneal washings even though this appeared to be a benign process.  After this was done, the right adnexa was elevated.  The ureter was identified, and using the Enseal for hemostasis and cutting, the right adnexa was completely separated from the round ligament and the top of the vaginal cuff and the IP ligament obviously was cauterized and cut for control of bleeding.  This was done without incident.  Attention was next turned to the left side.  Significant dissection was done both with a scissor, at one point with the Enseal, and at one point with hydrodissection, and now the left adnexa appeared to be free.  It was elevated.  The ureter was identified in the left side.  Using the Enseal, the attachments on the left including the infundibulopelvic ligament were cauterized and cut and now we had a separated left ovary and fallopian tube.  It was both the surgeon's and the assistant's impression that  part of the tube had been left.  At this point, further dissection was done on the left side.  Further adhesions were freed up using hydrodissection and the rest of the hydrosalpinx could be identified.  It was elevated and it could be removed atraumatically without any bleeding.  A 5-mm laparoscope was then placed in the left lower quadrant incision.  An Endopouch was placed subumbilically and all specimens were removed without difficulty.  Copious irrigation was done with sterile saline.  All fluid was removed.  There was no bleeding noted.  The pneumoperitoneum was evacuated.  All trocars were  removed. The subumbilical fascial incision was closed with interrupted 0 Vicryl. The skin of that wound was closed with 3-0 Monocryl and then Dermabond was used in the two lower 5-mm ports.  The Foley catheter was removed. It was still draining clear urine.  Estimated blood loss was minimal.  The patient tolerated the procedure well and left the operating room in satisfactory condition.     Davi Kroon L. Eda Paschal, M.D.     Tonette Bihari  D:  11/10/2010  T:  11/10/2010  Job:  [id number]  Electronically Signed by Edyth Gunnels M.D. on 11/14/2010 08:52:53 AM

## 2010-11-14 NOTE — H&P (Signed)
[name], [name]               ACCOUNT NO.:  [id number]  MEDICAL RECORD NO.:  [id number]  LOCATION:                               FACILITY:  [facility]  PHYSICIAN:  Daniel L. Gottsegen, M.D.DATE OF BIRTH:  Aug 15, 1945  DATE OF ADMISSION:  11/10/2010 DATE OF DISCHARGE:                             HISTORY & PHYSICAL   She is for the operating room at [facility] on Friday, November 10, 2010 at 7:30.  CHIEF COMPLAINT:  Symptomatic hydrosalpinx.  HISTORY OF PRESENT ILLNESS:  The patient is a 65 year old gravida 4, para 3-0-1-2, who has had a long history of intermittently symptomatic hydrosalpinx.  She has had been hospitalized previously with IV antibiotic therapy.  She has also been treated with oral antibiotic therapy on other occasions.  Her most recent event has occurred this spring.  She was treated with oral antibiotics with a symptomatic response and then her pain is much better.  Followup ultrasound, however, still shows bilateral hydrosalpinx of approximately 6-7 cm. She is status post vaginal hysterectomy done many years ago for fibroids.  She is tired of this persistent symptoms and reinfection of the hydrosalpinx.  She, therefore, enters the hospital for diagnostic laparoscopy with bilateral salpingo-oophorectomy.  She understands that she will no longer have her ovaries, but at this point is comfortable without them and is already on hormone replacement therapy.  She knows that there is some chance that she would require laparotomy.  PAST MEDICAL HISTORY:  Skin carcinoma of basal cell treated on her nose, hemorrhoid banding previously done, vaginal hysterectomy done for fibroids, previous tubal ligation done.  The patient is being treated for vitamin D deficiency as well.  She is also being treated for osteopenia.  She also takes Prilosec for GERD.  She also takes Ultram for arthritis.  SOCIAL HISTORY:  Noncontributory.  FAMILY HISTORY:  Mother and  paternal aunt with coronary artery disease. Brother and sister with diabetic.  Mother, father, brothers, and sisters with hypertension.  Sister and father with colon cancer.  Other sibling with melanoma.  SOCIAL HISTORY:  She is a nonsmoker, nondrinker.  REVIEW OF SYSTEMS:  HEENT:  Negative.  CARDIAC:  Negative.  RESPIRATORY: Negative.  GI:  Reveals a history of both GERD and hemorrhoids.  GU: Negative.  MUSCULOSKELETAL:  Reveals a history of back surgery.  She also has osteopenia, being treated with vitamin D.  ENDOCRINE: Negative.  PHYSICAL EXAMINATION:  GENERAL:  The patient is a well-developed, well- nourished female, in no acute distress. VITAL SIGNS:  Blood pressure 116/70; pulses 80 and regular; respirations 16, nonlabored.  She is afebrile. HEENT:  Within normal limits. NECK:  Supple.  Trachea in the midline.  Thyroid is not enlarged. LUNGS:  Clear to P and A. HEART:  No thrills, heaves, or murmurs. BREASTS:  No masses. ABDOMEN:  Soft without guarding, rebound, or masses. PELVIC:  External is normal.  BUS is normal.  Vaginal exam is normal. Cervix and uterus are absent bilateral, and rectal exam reveals bilateral small masses consistent with hydrosalpinx and confirmed on ultrasound. EXTREMITIES:  Within normal limits.  ADMISSION IMPRESSION:  Symptomatic bilateral hydrosalpinx.  PLAN:  Diagnostic laparoscopy with bilateral  salpingo-oophorectomy.     Daniel L. Eda Paschal, M.D.     Tonette Bihari  D:  11/08/2010  T:  11/08/2010  Job:  [id number]  cc:   [facility]  Electronically Signed by Edyth Gunnels M.D. on 11/14/2010 08:51:10 AM

## 2010-11-16 ENCOUNTER — Encounter: Payer: Self-pay | Admitting: Obstetrics and Gynecology

## 2010-11-16 ENCOUNTER — Ambulatory Visit (INDEPENDENT_AMBULATORY_CARE_PROVIDER_SITE_OTHER): Payer: 59 | Admitting: Obstetrics and Gynecology

## 2010-11-16 VITALS — BP 120/74

## 2010-11-16 DIAGNOSIS — N801 Endometriosis of ovary: Secondary | ICD-10-CM

## 2010-11-16 DIAGNOSIS — N809 Endometriosis, unspecified: Secondary | ICD-10-CM | POA: Insufficient documentation

## 2010-11-16 DIAGNOSIS — N838 Other noninflammatory disorders of ovary, fallopian tube and broad ligament: Secondary | ICD-10-CM

## 2010-11-16 DIAGNOSIS — N802 Endometriosis of fallopian tube: Secondary | ICD-10-CM

## 2010-11-16 DIAGNOSIS — N9489 Other specified conditions associated with female genital organs and menstrual cycle: Secondary | ICD-10-CM

## 2010-11-16 NOTE — Progress Notes (Signed)
Patient came back today for a postoperative visit. Her pathology report revealed endometriosis and endosalpingiosis. She is doing well postoperatively but is complaining of some upper back pain and headache over the last several days. She is reinitiated her estrogen and only missed 2 days.   Abdomen is soft without masses guarding or rebound. Incisions are healing well. External and vaginal within normal limits. Vagina within normal limits. Bimanual negative. Assessment: Normal postoperative course.  Plan: Discussed in detail her endometriosis. She will sure this with her 2 daughters. Continue estradiol 1 mg daily. Discussed headache and back pain with PCP. If the problem persists we can consider adjusting her estrogen dose for the headaches. Patient to return in November for yearly visit. She will get a mammogram then also. She will aso get a bone density here then also.

## 2010-11-21 ENCOUNTER — Encounter: Payer: Self-pay | Admitting: Gynecology

## 2010-12-06 ENCOUNTER — Other Ambulatory Visit: Payer: Self-pay | Admitting: Obstetrics and Gynecology

## 2011-02-05 ENCOUNTER — Other Ambulatory Visit (HOSPITAL_COMMUNITY)
Admission: RE | Admit: 2011-02-05 | Discharge: 2011-02-05 | Disposition: A | Discharge status: Home / Self Care | Payer: 59 | Source: Ambulatory Visit | Attending: Obstetrics and Gynecology | Admitting: Obstetrics and Gynecology

## 2011-02-05 ENCOUNTER — Ambulatory Visit (INDEPENDENT_AMBULATORY_CARE_PROVIDER_SITE_OTHER): Payer: 59 | Admitting: Obstetrics and Gynecology

## 2011-02-05 ENCOUNTER — Encounter: Payer: Self-pay | Admitting: Obstetrics and Gynecology

## 2011-02-05 VITALS — BP 118/76 | Ht 65.0 in | Wt 154.0 lb

## 2011-02-05 DIAGNOSIS — M949 Disorder of cartilage, unspecified: Secondary | ICD-10-CM

## 2011-02-05 DIAGNOSIS — M549 Dorsalgia, unspecified: Secondary | ICD-10-CM | POA: Insufficient documentation

## 2011-02-05 DIAGNOSIS — M899 Disorder of bone, unspecified: Secondary | ICD-10-CM

## 2011-02-05 DIAGNOSIS — M8580 Other specified disorders of bone density and structure, unspecified site: Secondary | ICD-10-CM

## 2011-02-05 DIAGNOSIS — Z124 Encounter for screening for malignant neoplasm of cervix: Secondary | ICD-10-CM | POA: Insufficient documentation

## 2011-02-05 DIAGNOSIS — Z01419 Encounter for gynecological examination (general) (routine) without abnormal findings: Secondary | ICD-10-CM

## 2011-02-05 MED ORDER — ESTRADIOL 1 MG PO TABS: 1.0000 mg | ORAL_TABLET | Freq: Every day | ORAL | Status: DC

## 2011-02-05 NOTE — Progress Notes (Signed)
Patient came to see me today for her annual GYN exam. Since she had her ovaries and tubes removed the summer she has been pain-free. She had endometriosis and endosalpingosis. She is doing well on her oral estrogen. She try an estrogen patch previously and did not do well with that. She is due for both a mammogram and a bone density. She is having no vaginal bleeding. She is osteopenia without elevated FRAX risk. Her PCP just took her off vitamin D. She is having a lot of back pain and sees Dr. Ethelene Hal and just had an injection.  ROS: 12 system review done. See history of present illness. Also history of esophageal reflux.  HEENT: Within normal limits.  Kennon Portela present Neck: No masses. Supraclavicular lymph nodes: Not enlarged. Breasts: Examined in both sitting and lying position. Symmetrical without skin changes or masses. Abdomen: Soft no masses guarding or rebound. No hernias. Pelvic: External within normal limits. BUS within normal limits. Vaginal examination shows good estrogen effect, no cystocele enterocele or rectocele. Cervix and uterus absent. Adnexa within normal limits. Rectovaginal confirmatory. Extremities within normal limits.  Assessment: #1. Endometriosis #2. Endosalpingosis #3. Low bone mass  Plan: Continue estradiol 1 mg daily. Discussed estrogen patch the patient declined. [facility]

## 2011-02-22 ENCOUNTER — Ambulatory Visit (INDEPENDENT_AMBULATORY_CARE_PROVIDER_SITE_OTHER): Payer: 59

## 2011-02-22 DIAGNOSIS — M949 Disorder of cartilage, unspecified: Secondary | ICD-10-CM

## 2011-02-22 DIAGNOSIS — M8580 Other specified disorders of bone density and structure, unspecified site: Secondary | ICD-10-CM

## 2011-04-10 ENCOUNTER — Other Ambulatory Visit: Payer: Self-pay | Admitting: Gastroenterology

## 2012-02-11 ENCOUNTER — Encounter: Payer: 59 | Admitting: Obstetrics and Gynecology

## 2012-03-05 ENCOUNTER — Ambulatory Visit (INDEPENDENT_AMBULATORY_CARE_PROVIDER_SITE_OTHER): Payer: 59 | Admitting: Obstetrics and Gynecology

## 2012-03-05 ENCOUNTER — Encounter: Payer: Self-pay | Admitting: Obstetrics and Gynecology

## 2012-03-05 VITALS — BP 120/76 | Ht 65.0 in | Wt 162.0 lb

## 2012-03-05 DIAGNOSIS — C439 Malignant melanoma of skin, unspecified: Secondary | ICD-10-CM | POA: Insufficient documentation

## 2012-03-05 DIAGNOSIS — D219 Benign neoplasm of connective and other soft tissue, unspecified: Secondary | ICD-10-CM | POA: Insufficient documentation

## 2012-03-05 DIAGNOSIS — M8580 Other specified disorders of bone density and structure, unspecified site: Secondary | ICD-10-CM | POA: Insufficient documentation

## 2012-03-05 DIAGNOSIS — K219 Gastro-esophageal reflux disease without esophagitis: Secondary | ICD-10-CM | POA: Insufficient documentation

## 2012-03-05 DIAGNOSIS — I10 Essential (primary) hypertension: Secondary | ICD-10-CM | POA: Insufficient documentation

## 2012-03-05 DIAGNOSIS — K5790 Diverticulosis of intestine, part unspecified, without perforation or abscess without bleeding: Secondary | ICD-10-CM | POA: Insufficient documentation

## 2012-03-05 DIAGNOSIS — K649 Unspecified hemorrhoids: Secondary | ICD-10-CM | POA: Insufficient documentation

## 2012-03-05 DIAGNOSIS — K222 Esophageal obstruction: Secondary | ICD-10-CM | POA: Insufficient documentation

## 2012-03-05 DIAGNOSIS — M1990 Unspecified osteoarthritis, unspecified site: Secondary | ICD-10-CM | POA: Insufficient documentation

## 2012-03-05 DIAGNOSIS — K449 Diaphragmatic hernia without obstruction or gangrene: Secondary | ICD-10-CM | POA: Insufficient documentation

## 2012-03-05 DIAGNOSIS — Z01419 Encounter for gynecological examination (general) (routine) without abnormal findings: Secondary | ICD-10-CM

## 2012-03-05 MED ORDER — ESTRADIOL 1 MG PO TABS: 1.0000 mg | ORAL_TABLET | Freq: Every day | ORAL | Status: DC

## 2012-03-05 NOTE — Patient Instructions (Signed)
Schedule mammogram.

## 2012-03-05 NOTE — Progress Notes (Signed)
Patient came to see me today for her annual GYN exam. She remains on oral estrogen for control of menopausal symptoms. She had previously tried an estrogen patch and did not do well with it. In 1993 she had a vaginal hysterectomy for fibroids. In 2012 she had diagnostic laparoscopy with bilateral salpingo-oophorectomy for endometriosis and endosalpingiosis and pelvic pain. She now has no pelvic pain. She has no vaginal bleeding. Her last bone density was one year ago and showed low bone mass without an elevated fracture risk. She is due for her mammogram. She has always had normal Pap smears. Her last Pap smear was 2012.  HEENT: Within normal limits.Amanda [name] present. Neck: No masses. Supraclavicular lymph nodes: Not enlarged. Breasts: Examined in both sitting and lying position. Symmetrical without skin changes or masses. Abdomen: Soft no masses guarding or rebound. No hernias. Pelvic: External within normal limits. BUS within normal limits. Vaginal examination shows good estrogen effect, no cystocele enterocele or rectocele. Cervix and uterus absent. Adnexa within normal limits. Rectovaginal confirmatory. Extremities within normal limits.  Assessment: Menopausal symptoms. History of endometriosis with endosalpingosis.  Plan: Mammogram. Continue estradiol 1 mg daily. Pap not done.The new Pap smear guidelines were discussed with the patient.

## 2012-03-07 ENCOUNTER — Other Ambulatory Visit: Payer: Self-pay | Admitting: Obstetrics and Gynecology

## 2012-03-14 ENCOUNTER — Other Ambulatory Visit: Payer: Self-pay | Admitting: *Deleted

## 2012-03-14 MED ORDER — ESTRADIOL 1 MG PO TABS: 1.0000 mg | ORAL_TABLET | Freq: Every day | ORAL | Status: DC

## 2012-05-15 ENCOUNTER — Other Ambulatory Visit: Payer: Self-pay | Admitting: Gastroenterology

## 2012-12-10 ENCOUNTER — Other Ambulatory Visit: Payer: Self-pay | Admitting: *Deleted

## 2012-12-10 MED ORDER — OMEPRAZOLE 40 MG PO CPDR: DELAYED_RELEASE_CAPSULE | ORAL | Status: DC

## 2013-01-05 ENCOUNTER — Telehealth: Payer: Self-pay | Admitting: *Deleted

## 2013-01-05 MED ORDER — ESTRADIOL 1 MG PO TABS: 1.0000 mg | ORAL_TABLET | Freq: Every day | ORAL | Status: DC

## 2013-01-05 NOTE — Telephone Encounter (Signed)
Pt has new insurance company with [employer] and will need her Rx for estradiol 1 mg sent to new pharmacy. rx will be sent.

## 2013-01-19 ENCOUNTER — Other Ambulatory Visit: Payer: Self-pay | Admitting: Neurosurgery

## 2013-01-29 ENCOUNTER — Other Ambulatory Visit: Payer: 59

## 2013-01-30 ENCOUNTER — Ambulatory Visit
Admission: RE | Admit: 2013-01-30 | Discharge: 2013-01-30 | Disposition: A | Discharge status: Home / Self Care | Payer: Medicare Other | Source: Ambulatory Visit | Attending: Neurosurgery | Admitting: Neurosurgery

## 2013-01-30 MED ORDER — GADOBENATE DIMEGLUMINE 529 MG/ML IV SOLN
7.0000 mL | Freq: Once | INTRAVENOUS | Status: AC | PRN
  Administered 2013-01-30: 7 mL via INTRAVENOUS

## 2013-03-06 ENCOUNTER — Encounter: Payer: Self-pay | Admitting: Gynecology

## 2013-04-09 ENCOUNTER — Ambulatory Visit (INDEPENDENT_AMBULATORY_CARE_PROVIDER_SITE_OTHER): Payer: Medicare Other | Admitting: Gynecology

## 2013-04-09 ENCOUNTER — Encounter: Payer: Self-pay | Admitting: Gynecology

## 2013-04-09 VITALS — BP 124/78 | Ht 65.0 in | Wt 160.0 lb

## 2013-04-09 DIAGNOSIS — N952 Postmenopausal atrophic vaginitis: Secondary | ICD-10-CM

## 2013-04-09 DIAGNOSIS — M949 Disorder of cartilage, unspecified: Secondary | ICD-10-CM

## 2013-04-09 DIAGNOSIS — N951 Menopausal and female climacteric states: Secondary | ICD-10-CM

## 2013-04-09 DIAGNOSIS — Z79890 Hormone replacement therapy (postmenopausal): Secondary | ICD-10-CM

## 2013-04-09 DIAGNOSIS — M8580 Other specified disorders of bone density and structure, unspecified site: Secondary | ICD-10-CM

## 2013-04-09 DIAGNOSIS — M899 Disorder of bone, unspecified: Secondary | ICD-10-CM

## 2013-04-09 NOTE — Progress Notes (Signed)
Amanda [name] 11-14-1945 [id number]        68 y.o.  [id number] for followup exam.  Former patient of [facility]. Several issues noted below.  Past medical history,surgical history, problem list, medications, allergies, family history and social history were all reviewed and documented in the EPIC chart.  ROS:  Performed and pertinent positives and negatives are included in the history, assessment and plan .  Exam: Kim assistant Filed Vitals:   04/09/13 0809  BP: 124/78  Height: 5\' 5"  (1.651 m)  Weight: 160 lb (72.576 kg)   General appearance  Normal Skin grossly normal Head/Neck normal with no cervical or supraclavicular adenopathy thyroid normal Lungs  clear Cardiac RR, without RMG Abdominal  soft, nontender, without masses, organomegaly or hernia Breasts  examined lying and sitting without masses, retractions, discharge or axillary adenopathy. Pelvic  Ext/BUS/vagina  Normal with generalized atrophic changes   Adnexa  Without masses or tenderness    Anus and perineum  Normal   Rectovaginal  Normal sphincter tone without palpated masses or tenderness.    Assessment/Plan:  68 y.o. [id number] female for followup exam.   1. Postmenopausal/hormone replacement therapy. Status post TVH for leiomyoma/bleeding. Subsequent BSO for hydrosalpinx. Patient is on estradiol 1 mg daily for hot flashes. Has done well with this.  I reviewed the whole issue of HRT with her to include the WHI study with increased risk of stroke, heart attack, DVT and breast cancer. The ACOG and NAMS statements for lowest dose for the shortest period of time reviewed. Transdermal versus oral first-pass effect benefit discussed. Issues as to when to wean discussed. I recommended she go to 0.5 mg estradiol see how she does. Ultimately weaned off if she tolerates this. She did try the patch before but had difficulties with this and started the oral medication. She has worsening symptoms after she weans that she will continue at her  choice. 2. Osteopenia. DEXA 02/2011 with T score -1.9. FRAX 8.2%/0.7%. Repeat DEXA now. Calcium vitamin D recommendations reviewed. 3. Pap smear 2012. No Pap smear done today. No history of abnormal Pap smears previously. Reviewed current screening guidelines. 67 with history of hysterectomy for benign indications. We both agreed to stop screening now she is comfortable with this. 4. Mammography 2011. Patient understands she is way overdue and agrees to schedule. Benefits of early detection reviewed. SBE monthly reviewed. 5. Colonoscopy 2012. Repeat at their recommended interval. 6. Health maintenance. No blood work done as she reports this all done through her primary physician's office.   Note: This document was prepared with digital dictation and possible smart phrase technology. Any transcriptional errors that result from this process are unintentional.   Anastasio Auerbach MD, 8:42 AM 04/09/2013

## 2013-04-09 NOTE — Patient Instructions (Signed)
Try the lower dose of the estrogen as we discussed. Call me if you have any issues. Schedule your mammogram. Followup in one year for annual exam.

## 2013-04-10 LAB — URINALYSIS W MICROSCOPIC + REFLEX CULTURE
BACTERIA UA: NONE SEEN
Bilirubin Urine: NEGATIVE
Casts: NONE SEEN
Crystals: NONE SEEN
Glucose, UA: NEGATIVE mg/dL
Hgb urine dipstick: NEGATIVE
KETONES UR: NEGATIVE mg/dL
Leukocytes, UA: NEGATIVE
Nitrite: NEGATIVE
Protein, ur: NEGATIVE mg/dL
Squamous Epithelial / LPF: NONE SEEN
UROBILINOGEN UA: 0.2 mg/dL (ref 0.0–1.0)
pH: 6 (ref 5.0–8.0)

## 2013-04-24 ENCOUNTER — Encounter: Payer: Self-pay | Admitting: Obstetrics and Gynecology

## 2013-05-27 ENCOUNTER — Other Ambulatory Visit: Payer: Self-pay

## 2013-05-27 MED ORDER — ESTRADIOL 1 MG PO TABS: 1.0000 mg | ORAL_TABLET | Freq: Every day | ORAL | Status: DC

## 2013-05-27 NOTE — Telephone Encounter (Signed)
I recommended she go to 0.5 mg estradiol see how she does. Ultimately weaned off if she tolerates this. She did try the patch before but had difficulties with this and started the oral medication. She has worsening symptoms after she weans that she will continue at her choice.

## 2013-11-14 ENCOUNTER — Encounter: Payer: Self-pay | Admitting: Gastroenterology

## 2013-12-04 ENCOUNTER — Telehealth: Payer: Self-pay | Admitting: Cardiology

## 2013-12-04 NOTE — Telephone Encounter (Signed)
Spoke with pt, per her report she went to the ER for anxiety and panic attack. Her bp was elevated and they admitted her, she was d/c on Tuesday. She saw dr Caprice Beaver yesterday and was told she needed a stress test due to the changes on her EKG. Explained to pt will need to get the records from [place] and show those to dr [name] before anything can be scheduled. Also explained to pt dr [name] may not want to schedule stress prior to seeing her. Patient voiced understanding and reported dr Caprice Beaver would do the stress test if we could not. Pt agreed with [place] getting the records and dr [name] reviewing before scheduling. Record release sent to [place]

## 2013-12-04 NOTE — Telephone Encounter (Signed)
Pt is scheduled to see Dr [name] on 01-04-14. She saw her primary care doctor yesterday and he thinks she needs a stress test before 01-04-14. Please call.

## 2013-12-07 NOTE — Telephone Encounter (Signed)
Discussed with dr [name], records from [place] reviewed. Explained to pt we can not order stress testing prior to seeing her. She will contact her medical doctor for scheduling stress test.

## 2014-01-04 ENCOUNTER — Encounter: Payer: Self-pay | Admitting: *Deleted

## 2014-01-04 ENCOUNTER — Encounter: Payer: Self-pay | Admitting: Cardiology

## 2014-01-04 ENCOUNTER — Ambulatory Visit (INDEPENDENT_AMBULATORY_CARE_PROVIDER_SITE_OTHER): Payer: Medicare Other | Admitting: Cardiology

## 2014-01-04 VITALS — BP 138/60 | HR 64 | Ht 65.5 in | Wt 164.6 lb

## 2014-01-04 DIAGNOSIS — I10 Essential (primary) hypertension: Secondary | ICD-10-CM

## 2014-01-04 DIAGNOSIS — R0600 Dyspnea, unspecified: Secondary | ICD-10-CM

## 2014-01-04 NOTE — Patient Instructions (Addendum)
Your physician recommends that you schedule a follow-up appointment in: [place] has requested that you have a stress echocardiogram. For further information please visit [email or web address]. Please follow instruction sheet as given.     Exercise Stress Echocardiogram An exercise stress echocardiogram is a heart (cardiac) test used to check the function of your heart. This test may also be called an exercise stress echocardiography or stress echo. This stress test will check how well your heart muscle and valves are working and determine if your heart muscle is getting enough blood. You will exercise on a treadmill to naturally increase or stress the functioning of your heart.  An echocardiogram uses sound waves (ultrasound) to produce an image of your heart. If your heart does not work normally, it may indicate coronary artery disease with poor coronary blood supply. The coronary arteries are the arteries that bring blood and oxygen to your heart. LET [facility] CARE PROVIDER KNOW ABOUT:  Any allergies you have.  All medicines you are taking, including vitamins, herbs, eye drops, creams, and over-the-counter medicines.  Previous problems you or members of your family have had with the use of anesthetics.  Any blood disorders you have.  Previous surgeries you have had.  Medical conditions you have.  Possibility of pregnancy, if this applies. RISKS AND COMPLICATIONS Generally, this is a safe procedure. However, as with any procedure, complications can occur. Possible complications can include:  You develop pain or pressure in the following areas:  Chest.  Jaw or neck.  Between your shoulder blades.  Radiating down your left arm.  Dizziness or lightheadedness.  Shortness of breath.  Increased or irregular heartbeat.  Nausea or vomiting.  Heart attack (rare). BEFORE THE PROCEDURE  Avoid all forms of caffeine for 24 hours before your test  or as directed by your health care provider. This includes coffee, tea (even decaffeinated tea), caffeinated sodas, chocolate, cocoa, and certain pain medicines.  Follow your health care provider's instructions regarding eating and drinking before the test.  Take your medicines as directed at regular times with water unless instructed otherwise. Exceptions may include:  If you have diabetes, ask how you are to take your insulin or pills. It is common to adjust insulin dosing the morning of the test.  If you are taking beta-blocker medicines, it is important to talk to your health care provider about these medicines well before the date of your test. Taking beta-blocker medicines may interfere with the test. In some cases, these medicines need to be changed or stopped 24 hours or more before the test.  If you wear a nitroglycerin patch, it may need to be removed prior to the test. Ask your health care provider if the patch should be removed before the test.  If you use an inhaler for any breathing condition, bring it with you to the test.  If you are an outpatient, bring a snack so you can eat right after the stress phase of the test.  Do not smoke for 4 hours prior to the test or as directed by your health care provider.  Wear loose-fitting clothes and comfortable shoes for the test. This test involves walking on a treadmill. PROCEDURE   Multiple electrodes will be put on your chest. If needed, small areas of your chest may be shaved to get better contact with the electrodes. Once the electrodes are attached to your body, multiple wires will be attached to the electrodes, and your heart rate  will be monitored.  You will have an echocardiogram done at rest.  To produce this image of your heart, gel is applied to your chest, and a wand-like tool (transducer) is moved over the chest. The transducer sends the sound waves through the chest to create the moving images of your heart.  You may  need an IV to receive a medication that improves the quality of the pictures.  You will then walk on a treadmill. The treadmill will be started at a slow pace. The treadmill speed and incline will gradually be increased to raise your heart rate.  At the peak of exercise, the treadmill will be stopped. You will lie down immediately on a bed so that a second echocardiogram can be done to visualize your heart's motion with exercise.  The test usually takes 30-60 minutes to complete. AFTER THE PROCEDURE  Your heart rate and blood pressure will be monitored after the test.  You may return to your normal schedule, including diet, activities, and medicines, unless your health care provider tells you otherwise. Document Released: 03/09/2004 Document Revised: 03/10/2013 Document Reviewed: 11/10/2012 [facility] Patient Information 2015 [place], [place]. This information is not intended to replace advice given to you by your health care provider. Make sure you discuss any questions you have with your health care provider.

## 2014-01-04 NOTE — Assessment & Plan Note (Addendum)
Blood pressure is controlled. Continue present medications. Note she has recurrent symptoms of acute dyspnea and elevated blood pressure we could consider workup for renal artery stenosis or pheochromocytoma.

## 2014-01-04 NOTE — Assessment & Plan Note (Signed)
Patient symptoms have resolved since she was hospitalized. Her electrocardiogram showed nonspecific ST changes. We will arrange a stress echocardiogram to screen for ischemia and quantitate LV function.

## 2014-01-04 NOTE — Progress Notes (Signed)
HPI: 68 year old female for evaluation of dyspnea. Patient has mild dyspnea on exertion but no orthopnea, PND, pedal edema, exertional chest pain, palpitations or syncope. In September while sitting at home she experienced several episodes of sudden dyspnea associated with a warm feeling. No chest pain or palpitations. No syncope. She had to go outside to catch her breath. She was taken to the emergency room [facility]. She was admitted and ruled out for myocardial infarction with serial enzymes. She was felt to have a panic attack. She was asked to follow up with cardiology for possible stress test. Note her blood pressure was elevated at admission and she was mildly bradycardic. Her atenolol was decreased. TSH was normal at 3.58. Since she was discharged she has not had any recurrent symptoms.  Current Outpatient Prescriptions  Medication Sig Dispense Refill  . ALPRAZolam (XANAX) 0.5 MG tablet Take 0.25-0.5 mg by mouth 4 (four) times daily as needed for anxiety.      Marland Kitchen amLODipine (NORVASC) 5 MG tablet Take 1 tablet by mouth daily.      Marland Kitchen atenolol (TENORMIN) 25 MG tablet Take by mouth daily.      . baclofen (LIORESAL) 10 MG tablet Take 10 mg by mouth 3 (three) times daily as needed for muscle spasms.      Marland Kitchen estradiol (ESTRACE) 1 MG tablet Take 1 tablet (1 mg total) by mouth daily.  90 tablet  3  . KOREAN GINSENG PO Take 1 capsule by mouth 2 (two) times daily.      . meloxicam (MOBIC) 15 MG tablet Take 1 tablet by mouth daily.      Marland Kitchen omeprazole (PRILOSEC) 40 MG capsule Take 1 capsule daily  90 capsule  3  . oxyCODONE-acetaminophen (PERCOCET) 10-325 MG per tablet Take 1 tablet by mouth every 4-6 hours as needed.      . VOLTAREN 1 % GEL Apply 1 application topically daily as needed.       No current facility-administered medications for this visit.    No Known Allergies  Past Medical History  Diagnosis Date  . Stricture of esophagus   . Hiatal hernia   . GERD (gastroesophageal  reflux disease)   . Diverticulosis   . Hemorrhoids   . Osteopenia   . Vitamin D deficiency   . Arthritis   . Hypertension   . Back pain   . Melanoma   . Hyperlipidemia     Past Surgical History  Procedure Laterality Date  . Back surgery      x 2  . Vaginal hysterectomy  1993  . Excision of skin cancer      Melanoma  . Hemorrhoid banding      X3  . Tubal ligation    . Pelvic laparoscopy  2012    Diag Lap-BSO-lysis of adhesions  . Oophorectomy  2012    BSO  . Foot surgery      History   Social History  . Marital Status: Married    Spouse Name: N/A    Number of Children: 3  . Years of Education: N/A   Occupational History  . Disabled    Social History Main Topics  . Smoking status: Former Smoker    Quit date: 03/19/1994  . Smokeless tobacco: Not on file  . Alcohol Use: No  . Drug Use: No  . Sexual Activity: Yes    Birth Control/ Protection: Surgical   Other Topics Concern  . Not on file   Social  History Narrative  . No narrative on file    Family History  Problem Relation Age of Onset  . Heart disease Mother   . Hypertension Mother   . Stroke Mother   . Colon polyps Father   . Heart disease Father     CHF  . Hypertension Father   . Cancer Father     COLON  . Colon cancer Sister   . Colon polyps Sister   . Heart disease Sister   . Diabetes Sister   . Melanoma Sister   . Colon polyps Brother   . Diabetes Brother   . Heart disease Brother   . Cancer Brother     COLON  . Melanoma Brother   . Melanoma Brother     ROS: Back pain but no fevers or chills, productive cough, hemoptysis, dysphasia, odynophagia, melena, hematochezia, dysuria, hematuria, rash, seizure activity, orthopnea, PND, pedal edema, claudication. Remaining systems are negative.  Physical Exam:   Blood pressure 138/60, pulse 64, height 5' 5.5" (1.664 m), weight 164 lb 9.6 oz (74.662 kg).  General:  Well developed/well nourished in NAD Skin warm/dry Patient not  depressed No peripheral clubbing Back-normal HEENT-normal/normal eyelids Neck supple/normal carotid upstroke bilaterally; no bruits; no JVD; no thyromegaly chest - CTA/ normal expansion CV - RRR/normal S1 and S2; no murmurs, rubs or gallops;  PMI nondisplaced Abdomen -NT/ND, no HSM, no mass, + bowel sounds, no bruit 2+ femoral pulses, no bruits Ext-no edema, chords, 2+ DP Neuro-grossly nonfocal  ECG Sinus rhythm with nonspecific ST changes.

## 2014-01-13 ENCOUNTER — Ambulatory Visit (HOSPITAL_COMMUNITY): Payer: Medicare Other | Attending: Internal Medicine

## 2014-01-13 DIAGNOSIS — I10 Essential (primary) hypertension: Secondary | ICD-10-CM | POA: Insufficient documentation

## 2014-01-13 DIAGNOSIS — R0602 Shortness of breath: Secondary | ICD-10-CM | POA: Diagnosis not present

## 2014-01-13 DIAGNOSIS — R0600 Dyspnea, unspecified: Secondary | ICD-10-CM

## 2014-01-13 DIAGNOSIS — I25119 Atherosclerotic heart disease of native coronary artery with unspecified angina pectoris: Secondary | ICD-10-CM

## 2014-01-13 NOTE — Progress Notes (Signed)
Stress Echocardiogram performed.  

## 2014-01-18 ENCOUNTER — Encounter: Payer: Self-pay | Admitting: Cardiology

## 2014-01-18 NOTE — Telephone Encounter (Signed)
This encounter was created in error - please disregard.

## 2014-01-18 NOTE — Telephone Encounter (Signed)
Pt is returning Debra's call from 10/30. Please call back

## 2014-01-18 NOTE — Telephone Encounter (Signed)
Calling about her results .Marland Kitchen Please call

## 2014-01-26 NOTE — Progress Notes (Signed)
      HPI: FU dyspnea. See previous notes for details. Stress echocardiogram October 2015 showed chest pain, electric cardiographic changes and possible anteroapical wall motion abnormality. Since last seen, She has mild dyspnea on exertion but no orthopnea, PND, pedal edema, syncope or chest pain.  Current Outpatient Prescriptions  Medication Sig Dispense Refill  . ALPRAZolam (XANAX) 0.5 MG tablet Take 0.25-0.5 mg by mouth 4 (four) times daily as needed for anxiety.    Marland Kitchen amLODipine (NORVASC) 5 MG tablet Take 1 tablet by mouth daily.    Marland Kitchen atenolol (TENORMIN) 25 MG tablet Take by mouth daily.    . baclofen (LIORESAL) 10 MG tablet Take 10 mg by mouth 3 (three) times daily as needed for muscle spasms.    Marland Kitchen estradiol (ESTRACE) 1 MG tablet Take 1 tablet (1 mg total) by mouth daily. 90 tablet 3  . KOREAN GINSENG PO Take 1 capsule by mouth 2 (two) times daily.    . meloxicam (MOBIC) 15 MG tablet Take 1 tablet by mouth daily.    Marland Kitchen omeprazole (PRILOSEC) 40 MG capsule Take 1 capsule daily 90 capsule 3  . oxyCODONE-acetaminophen (PERCOCET) 10-325 MG per tablet Take 1 tablet by mouth every 4-6 hours as needed.    . VOLTAREN 1 % GEL Apply 1 application topically daily as needed.     No current facility-administered medications for this visit.     Past Medical History  Diagnosis Date  . Stricture of esophagus   . Hiatal hernia   . GERD (gastroesophageal reflux disease)   . Diverticulosis   . Hemorrhoids   . Osteopenia   . Vitamin D deficiency   . Arthritis   . Hypertension   . Back pain   . Melanoma   . Hyperlipidemia   . Pituitary tumor     Past Surgical History  Procedure Laterality Date  . Back surgery      x 2  . Vaginal hysterectomy  1993  . Excision of skin cancer      Melanoma  . Hemorrhoid banding      X3  . Tubal ligation    . Pelvic laparoscopy  2012    Diag Lap-BSO-lysis of adhesions  . Oophorectomy  2012    BSO  . Foot surgery      History   Social History  .  Marital Status: Married    Spouse Name: N/A    Number of Children: 3  . Years of Education: N/A   Occupational History  . Disabled    Social History Main Topics  . Smoking status: Former Smoker    Quit date: 03/19/1994  . Smokeless tobacco: Not on file  . Alcohol Use: No  . Drug Use: No  . Sexual Activity: Yes    Birth Control/ Protection: Surgical   Other Topics Concern  . Not on file   Social History Narrative    ROS: no fevers or chills, productive cough, hemoptysis, dysphasia, odynophagia, melena, hematochezia, dysuria, hematuria, rash, seizure activity, orthopnea, PND, pedal edema, claudication. Remaining systems are negative.  Physical Exam: Well-developed well-nourished in no acute distress.  Skin is warm and dry.  HEENT is normal.  Neck is supple.  Chest is clear to auscultation with normal expansion.  Cardiovascular exam is regular rate and rhythm.  Abdominal exam nontender or distended. No masses palpated. Extremities show no edema. neuro grossly intact

## 2014-01-29 ENCOUNTER — Encounter: Payer: Self-pay | Admitting: Cardiology

## 2014-01-29 ENCOUNTER — Other Ambulatory Visit: Payer: Self-pay | Admitting: Cardiology

## 2014-01-29 ENCOUNTER — Encounter: Payer: Self-pay | Admitting: *Deleted

## 2014-01-29 ENCOUNTER — Ambulatory Visit (INDEPENDENT_AMBULATORY_CARE_PROVIDER_SITE_OTHER): Payer: Medicare Other | Admitting: Cardiology

## 2014-01-29 VITALS — BP 130/78 | HR 60 | Ht 65.0 in | Wt 162.0 lb

## 2014-01-29 DIAGNOSIS — I10 Essential (primary) hypertension: Secondary | ICD-10-CM

## 2014-01-29 DIAGNOSIS — R9439 Abnormal result of other cardiovascular function study: Secondary | ICD-10-CM | POA: Insufficient documentation

## 2014-01-29 DIAGNOSIS — R0600 Dyspnea, unspecified: Secondary | ICD-10-CM

## 2014-01-29 LAB — CBC
HEMATOCRIT: 37.9 % (ref 36.0–46.0)
HEMOGLOBIN: 12.8 g/dL (ref 12.0–15.0)
MCH: 31.9 pg (ref 26.0–34.0)
MCHC: 33.8 g/dL (ref 30.0–36.0)
MCV: 94.5 fL (ref 78.0–100.0)
Platelets: 191 10*3/uL (ref 150–400)
RBC: 4.01 MIL/uL (ref 3.87–5.11)
RDW: 12.9 % (ref 11.5–15.5)
WBC: 4.4 10*3/uL (ref 4.0–10.5)

## 2014-01-29 MED ORDER — ASPIRIN EC 81 MG PO TBEC: 81.0000 mg | DELAYED_RELEASE_TABLET | Freq: Every day | ORAL | Status: DC

## 2014-01-29 NOTE — Patient Instructions (Addendum)
Your physician recommends that you schedule a follow-up appointment in: [place] has requested that you have a cardiac catheterization. Cardiac catheterization is used to diagnose and/or treat various heart conditions. Doctors may recommend this procedure for a number of different reasons. The most common reason is to evaluate chest pain. Chest pain can be a symptom of coronary artery disease (CAD), and cardiac catheterization can show whether plaque is narrowing or blocking your heart's arteries. This procedure is also used to evaluate the valves, as well as measure the blood flow and oxygen levels in different parts of your heart. For further information please visit [email or web address]. Please follow instruction sheet, as give  Your physician recommends that you HAVE LAB WORK TODAY  START ASPIRIN 81 MG ONCE DAILY  Coronary Angiogram A coronary angiogram, also called coronary angiography, is an X-ray procedure used to look at the arteries in the heart. In this procedure, a dye (contrast dye) is injected through a long, hollow tube (catheter). The catheter is about the size of a piece of cooked spaghetti and is inserted through your groin, wrist, or arm. The dye is injected into each artery, and X-rays are then taken to show if there is a blockage in the arteries of your heart. LET [facility] CARE PROVIDER KNOW ABOUT:  Any allergies you have, including allergies to shellfish or contrast dye.   All medicines you are taking, including vitamins, herbs, eye drops, creams, and over-the-counter medicines.   Previous problems you or members of your family have had with the use of anesthetics.   Any blood disorders you have.   Previous surgeries you have had.  History of kidney problems or failure.   Other medical conditions you have. RISKS AND COMPLICATIONS  Generally, a coronary angiogram is a safe procedure. However, problems can occur and  include:  Allergic reaction to the dye.  Bleeding from the access site or other locations.  Kidney injury, especially in people with impaired kidney function.  Stroke (rare).  Heart attack (rare). BEFORE THE PROCEDURE   Do not eat or drink anything after midnight the night before the procedure or as directed by your health care provider.   Ask your health care provider about changing or stopping your regular medicines. This is especially important if you are taking diabetes medicines or blood thinners. PROCEDURE  You may be given a medicine to help you relax (sedative) before the procedure. This medicine is given through an intravenous (IV) access tube that is inserted into one of your veins.   The area where the catheter will be inserted will be washed and shaved. This is usually done in the groin but may be done in the fold of your arm (near your elbow) or in the wrist.   A medicine will be given to numb the area where the catheter will be inserted (local anesthetic).   The health care provider will insert the catheter into an artery. The catheter will be guided by using a special type of X-ray (fluoroscopy) of the blood vessel being examined.   A special dye will then be injected into the catheter, and X-rays will be taken. The dye will help to show where any narrowing or blockages are located in the heart arteries.  AFTER THE PROCEDURE   If the procedure is done through the leg, you will be kept in bed lying flat for several hours. You will be instructed to not bend or cross your legs.  The  insertion site will be checked frequently.   The pulse in your feet or wrist will be checked frequently.   Additional blood tests, X-rays, and an electrocardiogram may be done.  Document Released: 09/09/2002 Document Revised: 07/20/2013 Document Reviewed: 07/28/2012 [facility] Patient Information 2015 [place], [place]. This information is not intended to replace advice given to you  by your health care provider. Make sure you discuss any questions you have with your health care provider.

## 2014-01-29 NOTE — Assessment & Plan Note (Signed)
Stress echocardiogram is abnormal. We will plan catheterization as outlined.

## 2014-01-29 NOTE — Assessment & Plan Note (Signed)
Patient is noted to have electrocardiographic changes and anterior/apical wall motion abnormality on stress echocardiogram. We will proceed with cardiac catheterization for definitive evaluation. Risks and benefits were discussed and the patient agrees to proceed. Add aspirin 81 mg daily.

## 2014-01-29 NOTE — Assessment & Plan Note (Signed)
Blood pressure controlled. Continue present medications. 

## 2014-01-30 LAB — BASIC METABOLIC PANEL WITH GFR
BUN: 9 mg/dL (ref 6–23)
CHLORIDE: 108 meq/L (ref 96–112)
CO2: 27 mEq/L (ref 19–32)
Calcium: 8.3 mg/dL — ABNORMAL LOW (ref 8.4–10.5)
Creat: 0.82 mg/dL (ref 0.50–1.10)
GFR, EST AFRICAN AMERICAN: 86 mL/min
GFR, Est Non African American: 75 mL/min
Glucose, Bld: 70 mg/dL (ref 70–99)
POTASSIUM: 3.9 meq/L (ref 3.5–5.3)
SODIUM: 142 meq/L (ref 135–145)

## 2014-01-30 LAB — PROTIME-INR
INR: 0.98 (ref <1.50)
Prothrombin Time: 13 seconds (ref 11.6–15.2)

## 2014-02-03 ENCOUNTER — Ambulatory Visit (HOSPITAL_COMMUNITY)
Admission: RE | Admit: 2014-02-03 | Discharge: 2014-02-03 | Disposition: A | Discharge status: Home / Self Care | Payer: Medicare Other | Source: Ambulatory Visit | Attending: Cardiovascular Disease | Admitting: Cardiovascular Disease

## 2014-02-03 ENCOUNTER — Encounter (HOSPITAL_COMMUNITY)
Admission: RE | Disposition: A | Discharge status: Home / Self Care | Payer: Self-pay | Source: Ambulatory Visit | Attending: Cardiovascular Disease

## 2014-02-03 DIAGNOSIS — E785 Hyperlipidemia, unspecified: Secondary | ICD-10-CM | POA: Diagnosis not present

## 2014-02-03 DIAGNOSIS — M8580 Other specified disorders of bone density and structure, unspecified site: Secondary | ICD-10-CM | POA: Diagnosis not present

## 2014-02-03 DIAGNOSIS — K449 Diaphragmatic hernia without obstruction or gangrene: Secondary | ICD-10-CM | POA: Insufficient documentation

## 2014-02-03 DIAGNOSIS — I2510 Atherosclerotic heart disease of native coronary artery without angina pectoris: Secondary | ICD-10-CM | POA: Diagnosis not present

## 2014-02-03 DIAGNOSIS — K219 Gastro-esophageal reflux disease without esophagitis: Secondary | ICD-10-CM | POA: Insufficient documentation

## 2014-02-03 DIAGNOSIS — M1990 Unspecified osteoarthritis, unspecified site: Secondary | ICD-10-CM | POA: Insufficient documentation

## 2014-02-03 DIAGNOSIS — Z79899 Other long term (current) drug therapy: Secondary | ICD-10-CM | POA: Insufficient documentation

## 2014-02-03 DIAGNOSIS — K649 Unspecified hemorrhoids: Secondary | ICD-10-CM | POA: Insufficient documentation

## 2014-02-03 DIAGNOSIS — R9439 Abnormal result of other cardiovascular function study: Secondary | ICD-10-CM

## 2014-02-03 DIAGNOSIS — R079 Chest pain, unspecified: Secondary | ICD-10-CM | POA: Diagnosis present

## 2014-02-03 DIAGNOSIS — R0600 Dyspnea, unspecified: Secondary | ICD-10-CM

## 2014-02-03 DIAGNOSIS — Z87891 Personal history of nicotine dependence: Secondary | ICD-10-CM | POA: Diagnosis not present

## 2014-02-03 DIAGNOSIS — I10 Essential (primary) hypertension: Secondary | ICD-10-CM | POA: Insufficient documentation

## 2014-02-03 DIAGNOSIS — K5790 Diverticulosis of intestine, part unspecified, without perforation or abscess without bleeding: Secondary | ICD-10-CM | POA: Diagnosis not present

## 2014-02-03 HISTORY — PX: LEFT HEART CATHETERIZATION WITH CORONARY ANGIOGRAM: SHX5451

## 2014-02-03 SURGERY — LEFT HEART CATHETERIZATION WITH CORONARY ANGIOGRAM
Anesthesia: LOCAL

## 2014-02-03 MED ORDER — VERAPAMIL HCL 2.5 MG/ML IV SOLN
INTRAVENOUS | Status: AC
  Filled 2014-02-03: qty 2

## 2014-02-03 MED ORDER — SODIUM CHLORIDE 0.9 % IV SOLN: 1.0000 mL/kg/h | INTRAVENOUS | Status: DC

## 2014-02-03 MED ORDER — HEPARIN (PORCINE) IN NACL 2-0.9 UNIT/ML-% IJ SOLN
INTRAMUSCULAR | Status: AC
  Filled 2014-02-03: qty 1000

## 2014-02-03 MED ORDER — LIDOCAINE HCL (PF) 1 % IJ SOLN
INTRAMUSCULAR | Status: AC
  Filled 2014-02-03: qty 30

## 2014-02-03 MED ORDER — HEPARIN (PORCINE) IN NACL 2-0.9 UNIT/ML-% IJ SOLN
INTRAMUSCULAR | Status: AC
  Filled 2014-02-03: qty 500

## 2014-02-03 MED ORDER — ASPIRIN 81 MG PO CHEW: 81.0000 mg | CHEWABLE_TABLET | ORAL | Status: DC

## 2014-02-03 MED ORDER — NITROGLYCERIN 1 MG/10 ML FOR IR/CATH LAB
INTRA_ARTERIAL | Status: AC
  Filled 2014-02-03: qty 10

## 2014-02-03 MED ORDER — SODIUM CHLORIDE 0.9 % IJ SOLN: 3.0000 mL | Freq: Two times a day (BID) | INTRAMUSCULAR | Status: DC

## 2014-02-03 MED ORDER — OXYCODONE-ACETAMINOPHEN 5-325 MG PO TABS
1.0000 | ORAL_TABLET | Freq: Once | ORAL | Status: AC
  Administered 2014-02-03: 1 via ORAL
  Filled 2014-02-03: qty 1

## 2014-02-03 MED ORDER — SODIUM CHLORIDE 0.9 % IJ SOLN: 3.0000 mL | INTRAMUSCULAR | Status: DC | PRN

## 2014-02-03 MED ORDER — SODIUM CHLORIDE 0.9 % IV SOLN: 250.0000 mL | INTRAVENOUS | Status: DC | PRN

## 2014-02-03 MED ORDER — MIDAZOLAM HCL 2 MG/2ML IJ SOLN
INTRAMUSCULAR | Status: AC
  Filled 2014-02-03: qty 2

## 2014-02-03 MED ORDER — OXYCODONE-ACETAMINOPHEN 5-325 MG PO TABS
ORAL_TABLET | ORAL | Status: AC
  Administered 2014-02-03: 1 via ORAL
  Filled 2014-02-03: qty 1

## 2014-02-03 MED ORDER — FENTANYL CITRATE 0.05 MG/ML IJ SOLN
INTRAMUSCULAR | Status: AC
  Filled 2014-02-03: qty 2

## 2014-02-03 MED ORDER — HEPARIN SODIUM (PORCINE) 1000 UNIT/ML IJ SOLN
INTRAMUSCULAR | Status: AC
  Filled 2014-02-03: qty 1

## 2014-02-03 MED ORDER — SODIUM CHLORIDE 0.9 % IV SOLN
1.0000 mL/kg/h | INTRAVENOUS | Status: DC
  Administered 2014-02-03: 1 mL/kg/h via INTRAVENOUS

## 2014-02-03 MED ORDER — ACETAMINOPHEN 325 MG PO TABS: 650.0000 mg | ORAL_TABLET | ORAL | Status: DC | PRN

## 2014-02-03 MED ORDER — OXYCODONE-ACETAMINOPHEN 5-325 MG PO TABS
1.0000 | ORAL_TABLET | ORAL | Status: DC | PRN
  Filled 2014-02-03: qty 1

## 2014-02-03 MED ORDER — ONDANSETRON HCL 4 MG/2ML IJ SOLN: 4.0000 mg | Freq: Four times a day (QID) | INTRAMUSCULAR | Status: DC | PRN

## 2014-02-03 NOTE — CV Procedure (Signed)
    Cardiac Catheterization Procedure Note  Name: Amanda [name] MRN: [id number] DOB: October 05, 1945  Procedure: Left Heart Cath, Selective Coronary Angiography, LV angiography  Indication: Chest pain, dyspnea, abnormal stress echo.   Procedural Details: The right wrist was prepped, draped, and anesthetized with 1% lidocaine. Using the modified Seldinger technique, a 5/6 French Slender sheath was introduced into the right radial artery. 3 mg of verapamil was administered through the sheath, weight-based unfractionated heparin was administered intravenously. Standard Judkins catheters were used for selective coronary angiography and left ventriculography. Catheter exchanges were performed over an exchange length guidewire. There were no immediate procedural complications. A TR band was used for radial hemostasis at the completion of the procedure.  The patient was transferred to the post catheterization recovery area for further monitoring.  Procedural Findings: Hemodynamics: AO 152/65 LV 156/13  Coronary angiography: Coronary dominance: left  Left mainstem: Short segment, widely patent  Left anterior descending (LAD): Patent, large caliber vessel to the LV apex. Patent diagonals. The mid-LAD has mild diffuse nonobstructive plaque.  Left circumflex (LCx): Large, dominant vessel, no obstructive disease. The OM branches are patent.   Right coronary artery (RCA): Small, nondominant vessel. There is a 70% stenosis in the mid-vessel before the origin of a small RV marginal branch  Left ventriculography: Left ventricular systolic function is normal, LVEF is estimated at 55-65%, there is no significant mitral regurgitation   Estimated Blood Loss: minimal  Final Conclusions:   1. Moderate stenosis of a small nondominant RCA 2. Widely patent left main, LAD, and dominant left circumflex 3. Normal LV function  Recommendations: medical therapy.  Sherren Mocha MD, [facility] 02/03/2014, 11:26 AM

## 2014-02-03 NOTE — Interval H&P Note (Signed)
History and Physical Interval Note:  02/03/2014 10:18 AM  [name]  has presented today for surgery, with the diagnosis of abnormal stress test  The various methods of treatment have been discussed with the patient and family. After consideration of risks, benefits and other options for treatment, the patient has consented to  Procedure(s): LEFT HEART CATHETERIZATION WITH CORONARY ANGIOGRAM (N/A) as a surgical intervention .  The patient's history has been reviewed, patient examined, no change in status, stable for surgery.  I have reviewed the patient's chart and labs.  Questions were answered to the patient's satisfaction.    Cath Lab Visit (complete for each Cath Lab visit)  Clinical Evaluation Leading to the Procedure:   ACS: No.  Non-ACS:    Anginal Classification: CCS II  Anti-ischemic medical therapy: Maximal Therapy (2 or more classes of medications)  Non-Invasive Test Results: Intermediate-risk stress test findings: cardiac mortality 1-3%/year  Prior CABG: No previous CABG       Sherren Mocha

## 2014-02-03 NOTE — H&P (View-Only) (Signed)
      HPI: FU dyspnea. See previous notes for details. Stress echocardiogram October 2015 showed chest pain, electric cardiographic changes and possible anteroapical wall motion abnormality. Since last seen, She has mild dyspnea on exertion but no orthopnea, PND, pedal edema, syncope or chest pain.  Current Outpatient Prescriptions  Medication Sig Dispense Refill  . ALPRAZolam (XANAX) 0.5 MG tablet Take 0.25-0.5 mg by mouth 4 (four) times daily as needed for anxiety.    Marland Kitchen amLODipine (NORVASC) 5 MG tablet Take 1 tablet by mouth daily.    Marland Kitchen atenolol (TENORMIN) 25 MG tablet Take by mouth daily.    . baclofen (LIORESAL) 10 MG tablet Take 10 mg by mouth 3 (three) times daily as needed for muscle spasms.    Marland Kitchen estradiol (ESTRACE) 1 MG tablet Take 1 tablet (1 mg total) by mouth daily. 90 tablet 3  . KOREAN GINSENG PO Take 1 capsule by mouth 2 (two) times daily.    . meloxicam (MOBIC) 15 MG tablet Take 1 tablet by mouth daily.    Marland Kitchen omeprazole (PRILOSEC) 40 MG capsule Take 1 capsule daily 90 capsule 3  . oxyCODONE-acetaminophen (PERCOCET) 10-325 MG per tablet Take 1 tablet by mouth every 4-6 hours as needed.    . VOLTAREN 1 % GEL Apply 1 application topically daily as needed.     No current facility-administered medications for this visit.     Past Medical History  Diagnosis Date  . Stricture of esophagus   . Hiatal hernia   . GERD (gastroesophageal reflux disease)   . Diverticulosis   . Hemorrhoids   . Osteopenia   . Vitamin D deficiency   . Arthritis   . Hypertension   . Back pain   . Melanoma   . Hyperlipidemia   . Pituitary tumor     Past Surgical History  Procedure Laterality Date  . Back surgery      x 2  . Vaginal hysterectomy  1993  . Excision of skin cancer      Melanoma  . Hemorrhoid banding      X3  . Tubal ligation    . Pelvic laparoscopy  2012    Diag Lap-BSO-lysis of adhesions  . Oophorectomy  2012    BSO  . Foot surgery      History   Social History  .  Marital Status: Married    Spouse Name: N/A    Number of Children: 3  . Years of Education: N/A   Occupational History  . Disabled    Social History Main Topics  . Smoking status: Former Smoker    Quit date: 03/19/1994  . Smokeless tobacco: Not on file  . Alcohol Use: No  . Drug Use: No  . Sexual Activity: Yes    Birth Control/ Protection: Surgical   Other Topics Concern  . Not on file   Social History Narrative    ROS: no fevers or chills, productive cough, hemoptysis, dysphasia, odynophagia, melena, hematochezia, dysuria, hematuria, rash, seizure activity, orthopnea, PND, pedal edema, claudication. Remaining systems are negative.  Physical Exam: Well-developed well-nourished in no acute distress.  Skin is warm and dry.  HEENT is normal.  Neck is supple.  Chest is clear to auscultation with normal expansion.  Cardiovascular exam is regular rate and rhythm.  Abdominal exam nontender or distended. No masses palpated. Extremities show no edema. neuro grossly intact

## 2014-02-03 NOTE — Discharge Instructions (Signed)
Radial Site Care °Refer to this sheet in the next few weeks. These instructions provide you with information on caring for yourself after your procedure. Your caregiver may also give you more specific instructions. Your treatment has been planned according to current medical practices, but problems sometimes occur. Call your caregiver if you have any problems or questions after your procedure. °HOME CARE INSTRUCTIONS °· You may shower the day after the procedure. Remove the bandage (dressing) and gently wash the site with plain soap and water. Gently pat the site dry. °· Do not apply powder or lotion to the site. °· Do not submerge the affected site in water for 3 to 5 days. °· Inspect the site at least twice daily. °· Do not flex or bend the affected arm for 24 hours. °· No lifting over 5 pounds (2.3 kg) for 5 days after your procedure. °· Do not drive home if you are discharged the same day of the procedure. Have someone else drive you. °· You may drive 24 hours after the procedure unless otherwise instructed by your caregiver. °· Do not operate machinery or power tools for 24 hours. °· A responsible adult should be with you for the first 24 hours after you arrive home. °What to expect: °· Any bruising will usually fade within 1 to 2 weeks. °· Blood that collects in the tissue (hematoma) may be painful to the touch. It should usually decrease in size and tenderness within 1 to 2 weeks. °SEEK IMMEDIATE MEDICAL CARE IF: °· You have unusual pain at the radial site. °· You have redness, warmth, swelling, or pain at the radial site. °· You have drainage (other than a small amount of blood on the dressing). °· You have chills. °· You have a fever or persistent symptoms for more than 72 hours. °· You have a fever and your symptoms suddenly get worse. °· Your arm becomes pale, cool, tingly, or numb. °· You have heavy bleeding from the site. Hold pressure on the site. °Document Released: 04/07/2010 Document Revised:  05/28/2011 Document Reviewed: 04/07/2010 °Exit[employer]® Patient Information ©2015 [employer]. This information is not intended to replace advice given to you by your health care provider. Make sure you discuss any questions you have with your health care provider. ° °

## 2014-02-19 ENCOUNTER — Ambulatory Visit: Payer: Medicare Other | Admitting: Cardiology

## 2014-02-25 ENCOUNTER — Encounter (HOSPITAL_COMMUNITY): Payer: Self-pay | Admitting: Cardiovascular Disease

## 2014-03-31 NOTE — Progress Notes (Signed)
HPI: FU dyspnea. See previous notes for details. Stress echocardiogram October 2015 showed chest pain, electocardiographic changes and possible anteroapical wall motion abnormality. Cardiac catheterization November 2015 showed a 70% small nondominant right coronary artery and an ejection fraction of 55-65%. Medical therapy recommended. Since last seen, she has mild dyspnea on exertion but no orthopnea, PND, pedal edema, syncope or chest pain.  Current Outpatient Prescriptions  Medication Sig Dispense Refill  . ALPRAZolam (XANAX) 0.5 MG tablet Take 0.25-0.5 mg by mouth 4 (four) times daily as needed for anxiety.    Marland Kitchen amLODipine (NORVASC) 5 MG tablet Take 5 mg by mouth daily.     Marland Kitchen aspirin EC 81 MG tablet Take 1 tablet (81 mg total) by mouth daily. 90 tablet 3  . atenolol (TENORMIN) 25 MG tablet Take 25 mg by mouth daily.     . baclofen (LIORESAL) 10 MG tablet Take 10 mg by mouth 3 (three) times daily as needed for muscle spasms.    Marland Kitchen estradiol (ESTRACE) 1 MG tablet Take 1 tablet (1 mg total) by mouth daily. 90 tablet 3  . meloxicam (MOBIC) 15 MG tablet Take 15 mg by mouth daily.     Marland Kitchen omeprazole (PRILOSEC) 40 MG capsule Take 1 capsule daily (Patient taking differently: Take 40 mg by mouth daily. ) 90 capsule 3  . oxyCODONE-acetaminophen (PERCOCET) 10-325 MG per tablet Take 1 tablet by mouth every 4 (four) hours as needed for pain.     Marland Kitchen VOLTAREN 1 % GEL Apply 2 g topically daily as needed ([som).      No current facility-administered medications for this visit.     Past Medical History  Diagnosis Date  . Stricture of esophagus   . Hiatal hernia   . GERD (gastroesophageal reflux disease)   . Diverticulosis   . Hemorrhoids   . Osteopenia   . Vitamin D deficiency   . Arthritis   . Hypertension   . Back pain   . Melanoma   . Hyperlipidemia   . Pituitary tumor     Past Surgical History  Procedure Laterality Date  . Back surgery      x 2  . Vaginal hysterectomy  1993  .  Excision of skin cancer      Melanoma  . Hemorrhoid banding      X3  . Tubal ligation    . Pelvic laparoscopy  2012    Diag Lap-BSO-lysis of adhesions  . Oophorectomy  2012    BSO  . Foot surgery    . Left heart catheterization with coronary angiogram N/A 02/03/2014    Procedure: LEFT HEART CATHETERIZATION WITH CORONARY ANGIOGRAM;  Surgeon: [name], MD;  Location: [facility] CATH LAB;  Service: Cardiovascular;  Laterality: N/A;    History   Social History  . Marital Status: Married    Spouse Name: N/A    Number of Children: 3  . Years of Education: N/A   Occupational History  . Disabled    Social History Main Topics  . Smoking status: Former Smoker    Quit date: 03/19/1994  . Smokeless tobacco: Not on file  . Alcohol Use: No  . Drug Use: No  . Sexual Activity: Yes    Birth Control/ Protection: Surgical   Other Topics Concern  . Not on file   Social History Narrative    ROS: no fevers or chills, productive cough, hemoptysis, dysphasia, odynophagia, melena, hematochezia, dysuria, hematuria, rash, seizure activity, orthopnea, PND, pedal edema,  claudication. Remaining systems are negative.  Physical Exam: Well-developed well-nourished in no acute distress.  Skin is warm and dry.  HEENT is normal.  Neck is supple.  Chest is clear to auscultation with normal expansion.  Cardiovascular exam is regular rate and rhythm.  Abdominal exam nontender or distended. No masses palpated. Extremities show no edema. neuro grossly intact

## 2014-04-01 ENCOUNTER — Ambulatory Visit (INDEPENDENT_AMBULATORY_CARE_PROVIDER_SITE_OTHER): Payer: Medicare Other | Admitting: Cardiology

## 2014-04-01 ENCOUNTER — Encounter: Payer: Self-pay | Admitting: Cardiology

## 2014-04-01 VITALS — BP 130/60 | HR 56 | Ht 65.5 in | Wt 161.2 lb

## 2014-04-01 DIAGNOSIS — I10 Essential (primary) hypertension: Secondary | ICD-10-CM

## 2014-04-01 DIAGNOSIS — I2510 Atherosclerotic heart disease of native coronary artery without angina pectoris: Secondary | ICD-10-CM

## 2014-04-01 DIAGNOSIS — I2583 Coronary atherosclerosis due to lipid rich plaque: Principal | ICD-10-CM

## 2014-04-01 NOTE — Assessment & Plan Note (Signed)
Medical therapy. She is not having chest pain at present. Continue aspirin. Continue beta blocker. She is intolerant to statins. Continue diet.

## 2014-04-01 NOTE — Assessment & Plan Note (Signed)
Blood pressure controlled. Continue present medications. 

## 2014-04-01 NOTE — Patient Instructions (Signed)
Your physician wants you to follow-up in: ONE YEAR WITH DR CRENSHAW You will receive a reminder letter in the mail two months in advance. If you don't receive a letter, please call our office to schedule the follow-up appointment.  

## 2014-04-06 ENCOUNTER — Other Ambulatory Visit: Payer: Self-pay | Admitting: Gynecology

## 2014-04-06 DIAGNOSIS — Z1231 Encounter for screening mammogram for malignant neoplasm of breast: Secondary | ICD-10-CM

## 2014-04-14 ENCOUNTER — Ambulatory Visit (HOSPITAL_COMMUNITY): Payer: Medicare Other

## 2014-04-19 ENCOUNTER — Ambulatory Visit (HOSPITAL_COMMUNITY)
Admission: RE | Admit: 2014-04-19 | Discharge: 2014-04-19 | Disposition: A | Discharge status: Home / Self Care | Payer: Medicare Other | Source: Ambulatory Visit | Attending: Gynecology | Admitting: Gynecology

## 2014-04-19 DIAGNOSIS — Z1231 Encounter for screening mammogram for malignant neoplasm of breast: Secondary | ICD-10-CM | POA: Diagnosis not present

## 2014-05-13 ENCOUNTER — Encounter: Payer: Self-pay | Admitting: Gynecology

## 2014-05-13 ENCOUNTER — Ambulatory Visit (INDEPENDENT_AMBULATORY_CARE_PROVIDER_SITE_OTHER): Payer: Medicare Other | Admitting: Gynecology

## 2014-05-13 VITALS — BP 120/72 | Ht 65.0 in | Wt 161.0 lb

## 2014-05-13 DIAGNOSIS — M8580 Other specified disorders of bone density and structure, unspecified site: Secondary | ICD-10-CM

## 2014-05-13 DIAGNOSIS — Z79890 Hormone replacement therapy (postmenopausal): Secondary | ICD-10-CM

## 2014-05-13 DIAGNOSIS — N393 Stress incontinence (female) (male): Secondary | ICD-10-CM

## 2014-05-13 DIAGNOSIS — Z01419 Encounter for gynecological examination (general) (routine) without abnormal findings: Secondary | ICD-10-CM

## 2014-05-13 MED ORDER — ESTRADIOL 1 MG PO TABS: 1.0000 mg | ORAL_TABLET | Freq: Every day | ORAL | Status: DC

## 2014-05-13 NOTE — Patient Instructions (Addendum)
Schedule and follow up for your bone density. Try to wean from your hormone replacement. Call me if you have any issues. Try the Kegel exercises. Call if you want a referral to urologist   Kegel Exercises The goal of Kegel exercises is to isolate and exercise your pelvic floor muscles. These muscles act as a hammock that supports the rectum, vagina, small intestine, and uterus. As the muscles weaken, the hammock sags and these organs are displaced from their normal positions. Kegel exercises can strengthen your pelvic floor muscles and help you to improve bladder and bowel control, improve sexual response, and help reduce many problems and some discomfort during pregnancy. Kegel exercises can be done anywhere and at any time. HOW TO PERFORM KEGEL EXERCISES 1. Locate your pelvic floor muscles. To do this, squeeze (contract) the muscles that you use when you try to stop the flow of urine. You will feel a tightness in the vaginal area (women) and a tight lift in the rectal area (men and women). 2. When you begin, contract your pelvic muscles tight for 2-5 seconds, then relax them for 2-5 seconds. This is one set. Do 4-5 sets with a short pause in between. 3. Contract your pelvic muscles for 8-10 seconds, then relax them for 8-10 seconds. Do 4-5 sets. If you cannot contract your pelvic muscles for 8-10 seconds, try 5-7 seconds and work your way up to 8-10 seconds. Your goal is 4-5 sets of 10 contractions each day. Keep your stomach, buttocks, and legs relaxed during the exercises. Perform sets of both short and long contractions. Vary your positions. Perform these contractions 3-4 times per day. Perform sets while you are:   Lying in bed in the morning.  Standing at lunch.  Sitting in the late afternoon.  Lying in bed at night. You should do 40-50 contractions per day. Do not perform more Kegel exercises per day than recommended. Overexercising can cause muscle fatigue. Continue these exercises for  for at least 15-20 weeks or as directed by your caregiver. Document Released: 02/20/2012 Document Reviewed: 02/20/2012 [facility] Patient Information 2015 [place]. This information is not intended to replace advice given to you by your health care provider. Make sure you discuss any questions you have with your health care provider.

## 2014-05-13 NOTE — Progress Notes (Signed)
Amanda [name] Nov 16, 1945 [id number]        69 y.o.  [id number] for breast and pelvic exam. Several issues noted below.  Past medical history,surgical history, problem list, medications, allergies, family history and social history were all reviewed and documented as reviewed in the EPIC chart.  ROS:  Performed with pertinent positives and negatives included in the history, assessment and plan.   Additional significant findings :  none   Exam: Kim Counsellor Vitals:   05/13/14 1125  BP: 120/72  Height: 5\' 5"  (1.651 m)  Weight: 161 lb (73.029 kg)   General appearance:  Normal affect, orientation and appearance. Skin: Grossly normal HEENT: Without gross lesions.  No cervical or supraclavicular adenopathy. Thyroid normal.  Lungs:  Clear without wheezing, rales or rhonchi Cardiac: RR, without RMG Abdominal:  Soft, nontender, without masses, guarding, rebound, organomegaly or hernia Breasts:  Examined lying and sitting without masses, retractions, discharge or axillary adenopathy. Pelvic:  Ext/BUS/vagina with generalized atrophic changes.  Adnexa  Without masses or tenderness    Anus and perineum  Normal   Rectovaginal  Normal sphincter tone without palpated masses or tenderness.    Assessment/Plan:  69 y.o. [id number] female for breast and pelvic exam.   1. Postmenopausal/atrophic genital changes/HRT. Status post TVH for leiomyoma/bleeding. Subsequent BSO for hydrosalpinx.  Patient continues on estradiol 0.5 mg. She did wean down from 1 mg daily. I again reviewed the issues with HRT to include increased risk of stroke heart attack DVT and breast cancer.  Patient wants to continue to try to wean will try to stop her ERT this coming year. I did refill her 1 year for the 1 mg which she breaks in half in the event that she is unsuccessful weaning due to symptoms. 2. Urinary incontinence, stress historically. Loss of urine with laughing sneezing coughing. Options to include Kegel exercises,  physical therapy, continence devices, urology referral for surgery to include sling. At this point patient prefers to try Kegel exercises. Will call me if she wants referral to urology. Check urinalysis today. 3. Osteopenia.  DEXA 2012 T score -1.9 FRAX 8.2%/0.7%. Was to repeat last year but never did. Again recommended she schedule this and she agrees to do so. Recommend that she has vitamin D level checked at her primary physician's office when they do blood work and she agrees with this. 4. Pap smear 2012. No Pap smear done today. We both agree to stop screening based on current recommendations as she is over the age of 71 and status post hysterectomy for benign indications. 5. Mammography 04/2014. Continue with annual mammography. SBE monthly review. 6. Colonoscopy 2012. Repeat at their recommended interval. 7. Health maintenance. No routine blood work done as she has this done at her primary physician's office. Follow up 4 bone density otherwise 1 year, sooner as needed.     Anastasio Auerbach MD, 11:52 AM 05/13/2014

## 2014-05-14 LAB — URINALYSIS W MICROSCOPIC + REFLEX CULTURE
Bacteria, UA: NONE SEEN
Bilirubin Urine: NEGATIVE
CASTS: NONE SEEN
CRYSTALS: NONE SEEN
GLUCOSE, UA: NEGATIVE mg/dL
Hgb urine dipstick: NEGATIVE
KETONES UR: NEGATIVE mg/dL
LEUKOCYTES UA: NEGATIVE
NITRITE: NEGATIVE
PH: 6 (ref 5.0–8.0)
Protein, ur: NEGATIVE mg/dL
Squamous Epithelial / LPF: NONE SEEN
Urobilinogen, UA: 0.2 mg/dL (ref 0.0–1.0)

## 2014-07-22 ENCOUNTER — Observation Stay (HOSPITAL_COMMUNITY)
Admission: EM | Admit: 2014-07-22 | Discharge: 2014-07-23 | Disposition: A | Discharge status: Home / Self Care | Payer: Medicare Other | Attending: Internal Medicine | Admitting: Internal Medicine

## 2014-07-22 ENCOUNTER — Emergency Department (HOSPITAL_COMMUNITY): Payer: Medicare Other

## 2014-07-22 ENCOUNTER — Observation Stay (HOSPITAL_COMMUNITY): Payer: Medicare Other

## 2014-07-22 ENCOUNTER — Encounter (HOSPITAL_COMMUNITY): Payer: Self-pay | Admitting: Emergency Medicine

## 2014-07-22 DIAGNOSIS — K649 Unspecified hemorrhoids: Secondary | ICD-10-CM | POA: Diagnosis not present

## 2014-07-22 DIAGNOSIS — Z85820 Personal history of malignant melanoma of skin: Secondary | ICD-10-CM | POA: Diagnosis not present

## 2014-07-22 DIAGNOSIS — Z87891 Personal history of nicotine dependence: Secondary | ICD-10-CM | POA: Insufficient documentation

## 2014-07-22 DIAGNOSIS — K222 Esophageal obstruction: Secondary | ICD-10-CM | POA: Insufficient documentation

## 2014-07-22 DIAGNOSIS — I2510 Atherosclerotic heart disease of native coronary artery without angina pectoris: Secondary | ICD-10-CM | POA: Diagnosis not present

## 2014-07-22 DIAGNOSIS — K219 Gastro-esophageal reflux disease without esophagitis: Secondary | ICD-10-CM | POA: Diagnosis not present

## 2014-07-22 DIAGNOSIS — Z86018 Personal history of other benign neoplasm: Secondary | ICD-10-CM | POA: Diagnosis not present

## 2014-07-22 DIAGNOSIS — Z79899 Other long term (current) drug therapy: Secondary | ICD-10-CM | POA: Diagnosis not present

## 2014-07-22 DIAGNOSIS — R1013 Epigastric pain: Principal | ICD-10-CM | POA: Insufficient documentation

## 2014-07-22 DIAGNOSIS — R0789 Other chest pain: Secondary | ICD-10-CM | POA: Diagnosis not present

## 2014-07-22 DIAGNOSIS — Z7982 Long term (current) use of aspirin: Secondary | ICD-10-CM | POA: Insufficient documentation

## 2014-07-22 DIAGNOSIS — I10 Essential (primary) hypertension: Secondary | ICD-10-CM | POA: Diagnosis not present

## 2014-07-22 DIAGNOSIS — K449 Diaphragmatic hernia without obstruction or gangrene: Secondary | ICD-10-CM | POA: Diagnosis not present

## 2014-07-22 DIAGNOSIS — M8580 Other specified disorders of bone density and structure, unspecified site: Secondary | ICD-10-CM | POA: Diagnosis not present

## 2014-07-22 DIAGNOSIS — E559 Vitamin D deficiency, unspecified: Secondary | ICD-10-CM | POA: Insufficient documentation

## 2014-07-22 DIAGNOSIS — I25119 Atherosclerotic heart disease of native coronary artery with unspecified angina pectoris: Secondary | ICD-10-CM | POA: Diagnosis not present

## 2014-07-22 DIAGNOSIS — K5790 Diverticulosis of intestine, part unspecified, without perforation or abscess without bleeding: Secondary | ICD-10-CM | POA: Diagnosis not present

## 2014-07-22 DIAGNOSIS — M1990 Unspecified osteoarthritis, unspecified site: Secondary | ICD-10-CM | POA: Insufficient documentation

## 2014-07-22 DIAGNOSIS — R079 Chest pain, unspecified: Secondary | ICD-10-CM

## 2014-07-22 DIAGNOSIS — E785 Hyperlipidemia, unspecified: Secondary | ICD-10-CM | POA: Insufficient documentation

## 2014-07-22 HISTORY — DX: Chest pain, unspecified: R07.9

## 2014-07-22 LAB — CBC
HCT: 42 % (ref 36.0–46.0)
HEMATOCRIT: 37.8 % (ref 36.0–46.0)
HEMOGLOBIN: 12.4 g/dL (ref 12.0–15.0)
Hemoglobin: 14.1 g/dL (ref 12.0–15.0)
MCH: 31.9 pg (ref 26.0–34.0)
MCH: 31.2 pg (ref 26.0–34.0)
MCHC: 33.6 g/dL (ref 30.0–36.0)
MCHC: 32.8 g/dL (ref 30.0–36.0)
MCV: 95 fL (ref 78.0–100.0)
MCV: 95.2 fL (ref 78.0–100.0)
PLATELETS: 167 10*3/uL (ref 150–400)
Platelets: 189 10*3/uL (ref 150–400)
RBC: 4.42 MIL/uL (ref 3.87–5.11)
RBC: 3.97 MIL/uL (ref 3.87–5.11)
RDW: 12.4 % (ref 11.5–15.5)
RDW: 12.4 % (ref 11.5–15.5)
WBC: 9.5 10*3/uL (ref 4.0–10.5)
WBC: 6.4 10*3/uL (ref 4.0–10.5)

## 2014-07-22 LAB — I-STAT TROPONIN, ED
Troponin i, poc: 0 ng/mL (ref 0.00–0.08)
Troponin i, poc: 0.01 ng/mL (ref 0.00–0.08)

## 2014-07-22 LAB — BASIC METABOLIC PANEL
ANION GAP: 9 (ref 5–15)
BUN: 7 mg/dL (ref 6–20)
CALCIUM: 9 mg/dL (ref 8.9–10.3)
CO2: 26 mmol/L (ref 22–32)
Chloride: 101 mmol/L (ref 101–111)
Creatinine, Ser: 0.83 mg/dL (ref 0.44–1.00)
GFR calc Af Amer: >60 mL/min (ref >60)
GFR calc non Af Amer: >60 mL/min (ref >60)
GLUCOSE: 131 mg/dL — AB (ref 70–99)
POTASSIUM: 4.1 mmol/L (ref 3.5–5.1)
SODIUM: 136 mmol/L (ref 135–145)

## 2014-07-22 LAB — CREATININE, SERUM
Creatinine, Ser: 0.75 mg/dL (ref 0.44–1.00)
GFR calc Af Amer: >60 mL/min (ref >60)
GFR calc non Af Amer: >60 mL/min (ref >60)

## 2014-07-22 LAB — TROPONIN I

## 2014-07-22 MED ORDER — BACLOFEN 10 MG PO TABS: 10.0000 mg | ORAL_TABLET | Freq: Three times a day (TID) | ORAL | Status: DC | PRN

## 2014-07-22 MED ORDER — AMLODIPINE BESYLATE 5 MG PO TABS
5.0000 mg | ORAL_TABLET | Freq: Every day | ORAL | Status: DC
  Administered 2014-07-23: 5 mg via ORAL
  Filled 2014-07-22: qty 1

## 2014-07-22 MED ORDER — ATENOLOL 25 MG PO TABS: 25.0000 mg | ORAL_TABLET | Freq: Every day | ORAL | Status: DC

## 2014-07-22 MED ORDER — FENTANYL CITRATE (PF) 100 MCG/2ML IJ SOLN
50.0000 ug | Freq: Once | INTRAMUSCULAR | Status: AC
  Administered 2014-07-22: 50 ug via INTRAVENOUS
  Filled 2014-07-22: qty 2

## 2014-07-22 MED ORDER — IOHEXOL 350 MG/ML SOLN
80.0000 mL | Freq: Once | INTRAVENOUS | Status: AC | PRN
  Administered 2014-07-22: 100 mL via INTRAVENOUS

## 2014-07-22 MED ORDER — ESTRADIOL 1 MG PO TABS
0.5000 mg | ORAL_TABLET | Freq: Every day | ORAL | Status: DC
  Filled 2014-07-22: qty 0.5

## 2014-07-22 MED ORDER — GI COCKTAIL ~~LOC~~: 30.0000 mL | Freq: Four times a day (QID) | ORAL | Status: DC | PRN

## 2014-07-22 MED ORDER — OXYCODONE-ACETAMINOPHEN 5-325 MG PO TABS
1.0000 | ORAL_TABLET | ORAL | Status: DC | PRN
  Administered 2014-07-22 – 2014-07-23 (×2): 1 via ORAL
  Filled 2014-07-22 (×2): qty 1

## 2014-07-22 MED ORDER — OXYCODONE HCL 5 MG PO TABS
5.0000 mg | ORAL_TABLET | ORAL | Status: DC | PRN
  Administered 2014-07-22 – 2014-07-23 (×2): 5 mg via ORAL
  Filled 2014-07-22 (×2): qty 1

## 2014-07-22 MED ORDER — ONDANSETRON HCL 4 MG/2ML IJ SOLN
4.0000 mg | Freq: Once | INTRAMUSCULAR | Status: AC
  Administered 2014-07-22: 4 mg via INTRAVENOUS
  Filled 2014-07-22: qty 2

## 2014-07-22 MED ORDER — ALPRAZOLAM 0.25 MG PO TABS: 0.2500 mg | ORAL_TABLET | Freq: Four times a day (QID) | ORAL | Status: DC | PRN

## 2014-07-22 MED ORDER — ASPIRIN EC 81 MG PO TBEC
81.0000 mg | DELAYED_RELEASE_TABLET | Freq: Every day | ORAL | Status: DC
  Administered 2014-07-23: 81 mg via ORAL
  Filled 2014-07-22 (×2): qty 1

## 2014-07-22 MED ORDER — GI COCKTAIL ~~LOC~~
30.0000 mL | Freq: Once | ORAL | Status: AC
  Administered 2014-07-22: 30 mL via ORAL
  Filled 2014-07-22: qty 30

## 2014-07-22 MED ORDER — DICLOFENAC SODIUM 1 % TD GEL
2.0000 g | Freq: Every day | TRANSDERMAL | Status: DC | PRN
  Filled 2014-07-22: qty 100

## 2014-07-22 MED ORDER — PANTOPRAZOLE SODIUM 40 MG PO TBEC
80.0000 mg | DELAYED_RELEASE_TABLET | Freq: Every day | ORAL | Status: DC
  Administered 2014-07-23: 80 mg via ORAL
  Filled 2014-07-22: qty 2

## 2014-07-22 MED ORDER — OXYCODONE-ACETAMINOPHEN 10-325 MG PO TABS: 1.0000 | ORAL_TABLET | ORAL | Status: DC | PRN

## 2014-07-22 MED ORDER — ZOLPIDEM TARTRATE 5 MG PO TABS: 5.0000 mg | ORAL_TABLET | Freq: Every evening | ORAL | Status: DC | PRN

## 2014-07-22 MED ORDER — ACETAMINOPHEN 325 MG PO TABS: 650.0000 mg | ORAL_TABLET | ORAL | Status: DC | PRN

## 2014-07-22 MED ORDER — HEPARIN SODIUM (PORCINE) 5000 UNIT/ML IJ SOLN
5000.0000 [IU] | Freq: Three times a day (TID) | INTRAMUSCULAR | Status: DC
  Administered 2014-07-22 – 2014-07-23 (×2): 5000 [IU] via SUBCUTANEOUS
  Filled 2014-07-22 (×2): qty 1

## 2014-07-22 MED ORDER — ONDANSETRON HCL 4 MG/2ML IJ SOLN: 4.0000 mg | Freq: Four times a day (QID) | INTRAMUSCULAR | Status: DC | PRN

## 2014-07-22 NOTE — ED Provider Notes (Signed)
CSN: [id number]     Arrival date & time 07/22/14  1411 History   First MD Initiated Contact with Patient 07/22/14 1607     Chief Complaint  Patient presents with  . Chest Pain  . Back Pain     (Consider location/radiation/quality/duration/timing/severity/associated sxs/prior Treatment) HPI   Amanda [name] is a 69 y.o. female who presents for evaluation of epigastric discomfort which feels like a burning sensation, and radiates to her mid back. This discomfort has persisted all day, since starting at 8:30 AM. She has mild associated nausea without vomiting. She denies shortness of breath, weakness or dizziness. She's had similar symptoms in the past when she was troubled with heartburn. She takes omeprazole sporadically. She was able to eat some today, but has less appetite than usual. She denies any other recent illnesses. She states that she is taking her regular medications without change. There are no other known modifying factors.   Past Medical History  Diagnosis Date  . Stricture of esophagus   . Hiatal hernia   . GERD (gastroesophageal reflux disease)   . Diverticulosis   . Hemorrhoids   . Osteopenia   . Vitamin D deficiency   . Arthritis   . Hypertension   . Back pain   . Melanoma   . Hyperlipidemia   . Pituitary tumor    Past Surgical History  Procedure Laterality Date  . Back surgery      x 2  . Vaginal hysterectomy  1993  . Excision of skin cancer      Melanoma  . Hemorrhoid banding      X3  . Tubal ligation    . Pelvic laparoscopy  2012    Diag Lap-BSO-lysis of adhesions  . Oophorectomy  2012    BSO  . Foot surgery    . Left heart catheterization with coronary angiogram N/A 02/03/2014    Procedure: LEFT HEART CATHETERIZATION WITH CORONARY ANGIOGRAM;  Surgeon: [name], MD;  Location: [facility] CATH LAB;  Service: Cardiovascular;  Laterality: N/A;   Family History  Problem Relation Age of Onset  . Heart disease Mother   . Hypertension Mother   .  Stroke Mother   . Colon polyps Father   . Heart disease Father     CHF  . Hypertension Father   . Cancer Father     COLON  . Colon cancer Sister   . Colon polyps Sister   . Heart disease Sister   . Diabetes Sister   . Melanoma Sister   . Colon polyps Brother   . Diabetes Brother   . Heart disease Brother   . Cancer Brother     COLON  . Melanoma Brother   . Melanoma Brother    History  Substance Use Topics  . Smoking status: Former Smoker    Quit date: 03/19/1994  . Smokeless tobacco: Not on file  . Alcohol Use: No   OB History    Gravida Para Term Preterm AB TAB SAB Ectopic Multiple Living   3 3 3       2      Review of Systems  All other systems reviewed and are negative.     Allergies  Review of patient's allergies indicates no known allergies.  Home Medications   Prior to Admission medications   Medication Sig Start Date End Date Taking? Authorizing Provider  ALPRAZolam Duanne Moron) 0.5 MG tablet Take 0.25-0.5 mg by mouth 4 (four) times daily as needed for anxiety.  Yes Historical Provider, MD  amLODipine (NORVASC) 5 MG tablet Take 5 mg by mouth daily.  12/03/13  Yes Historical Provider, MD  aspirin EC 81 MG tablet Take 1 tablet (81 mg total) by mouth daily. 01/29/14  Yes Lelon Perla, MD  atenolol (TENORMIN) 25 MG tablet Take 25 mg by mouth daily.    Yes Historical Provider, MD  baclofen (LIORESAL) 10 MG tablet Take 10 mg by mouth 3 (three) times daily as needed for muscle spasms.   Yes Historical Provider, MD  estradiol (ESTRACE) 1 MG tablet Take 1 tablet (1 mg total) by mouth daily. Patient taking differently: Take 0.5 mg by mouth daily.  05/13/14  Yes Anastasio Auerbach, MD  meloxicam (MOBIC) 15 MG tablet Take 15 mg by mouth daily.  12/29/13  Yes Historical Provider, MD  omeprazole (PRILOSEC) 40 MG capsule Take 1 capsule daily Patient taking differently: Take 40 mg by mouth daily.  12/10/12  Yes Inda Castle, MD  oxyCODONE-acetaminophen (PERCOCET) 10-325  MG per tablet Take 1 tablet by mouth every 4 (four) hours as needed for pain.  12/31/13  Yes Historical Provider, MD  VOLTAREN 1 % GEL Apply 2 g topically daily as needed (pain).  12/04/13  Yes Historical Provider, MD   BP 173/56 mmHg  Pulse 58  Temp(Src) 97.8 F (36.6 C)  Resp 14  Wt 158 lb (71.668 kg)  SpO2 99% Physical Exam  Constitutional: She is oriented to person, place, and time. She appears well-developed.  Elderly, frail  HENT:  Head: Normocephalic and atraumatic.  Right Ear: External ear normal.  Left Ear: External ear normal.  Eyes: Conjunctivae and EOM are normal. Pupils are equal, round, and reactive to light.  Neck: Normal range of motion and phonation normal. Neck supple.  Cardiovascular: Normal rate, regular rhythm and normal heart sounds.   Pulmonary/Chest: Effort normal and breath sounds normal. She exhibits no bony tenderness.  Abdominal: Soft. There is no tenderness.  Musculoskeletal: Normal range of motion.  Neurological: She is alert and oriented to person, place, and time. No cranial nerve deficit or sensory deficit. She exhibits normal muscle tone. Coordination normal.  Skin: Skin is warm, dry and intact.  Psychiatric: She has a normal mood and affect. Her behavior is normal. Judgment and thought content normal.  Nursing note and vitals reviewed.   ED Course  Procedures (including critical care time)  Medications  gi cocktail (Maalox,Lidocaine,Donnatal) (30 mLs Oral Given 07/22/14 1658)  fentaNYL (SUBLIMAZE) injection 50 mcg (50 mcg Intravenous Given 07/22/14 1721)  ondansetron (ZOFRAN) injection 4 mg (4 mg Intravenous Given 07/22/14 1721)    Patient Vitals for the past 24 hrs:  BP Temp Pulse Resp SpO2 Weight  07/22/14 1717 173/56 mmHg - (!) 58 - 99 % -  07/22/14 1645 152/62 mmHg - (!) 56 14 98 % -  07/22/14 1600 150/56 mmHg - (!) 47 13 100 % -  07/22/14 1416 179/77 mmHg 97.8 F (36.6 C) 66 18 99 % 158 lb (71.668 kg)    5:18 PM Reevaluation with update  and discussion. After initial assessment and treatment, an updated evaluation reveals no improvement with GI cocktail. She still having moderately severe pain. There are no other complaints. Additional evaluation, treatment has been ordered.Daleen Bo L   6:29 PM Reevaluation with update and discussion. After initial assessment and treatment, an updated evaluation reveals she states that she still has right-sided mid back pain, that she rates as 7/10. Her epigastric burning discomfort has almost completely  resolved. The pain in her back. Localizes in the right mid to lower thoracic region. She has mild tenderness to touch in this area. Findings discussed with the patient, all questions answered.Richarda Blade   6:31 PM-Consult complete with Dr. Humphrey Rolls. Patient case explained and discussed. He agrees to admit patient for further evaluation and treatment. Call ended at [place] - Abnormal; Notable for the following:    Glucose, Bld 131 (*)    All other components within normal limits  CBC  I-STAT TROPOININ, ED  I-STAT TROPOININ, ED    Imaging Review Dg Chest 2 View  07/22/2014   CLINICAL DATA:  Mid chest pain radiating to the back associated with shortness of breath, history of coronary artery disease, esophageal reflux and stricture, remote history of tobacco use.  EXAM: CHEST  2 VIEW  COMPARISON:  PA and lateral chest of November 29, 2013  FINDINGS: The lungs are adequately inflated. There is no focal infiltrate. There is no pleural effusion. The heart and pulmonary vascularity are normal. The mediastinum is normal in width. The bony thorax is unremarkable.  IMPRESSION: COPD. There is no pneumonia, CHF, nor other acute cardiopulmonary abnormality.   Electronically Signed   By: David  Martinique M.D.   On: 07/22/2014 15:13     EKG Interpretation   Date/Time:  Thursday Jul 22 2014 14:17:46 EDT Ventricular Rate:  70 PR Interval:  186 QRS  Duration: 76 QT Interval:  400 QTC Calculation: 432 R Axis:   15 Text Interpretation:  Normal sinus rhythm Low voltage QRS Cannot rule out  Anterior infarct , age undetermined Abnormal ECG since last tracing no  significant change Confirmed by Eulis Foster  MD, Darriel Sinquefield [phone]) on 07/22/2014  4:09:02 PM      MDM   Final diagnoses:  Epigastric pain  Nonspecific chest pain  Coronary artery disease involving native coronary artery of native heart with angina pectoris    Abdominal and back pain, atypical, but possibly related to coronary artery disease. Patient did not improve with treatment for stomach acid related pain. She has known coronary artery disease, mild, treated medically. Last cardiac catheterization 2015. She will require admission for serial enzymes to evaluate for coronary ischemia, injury, and cardiology consultation.  Nursing Notes Reviewed/ Care Coordinated, and agree without changes. Applicable Imaging Reviewed.  Interpretation of Laboratory Data incorporated into ED treatment  Plan: Admit   Daleen Bo, MD 07/23/14 [phone]

## 2014-07-22 NOTE — ED Notes (Signed)
Pt ambulates independently to restroom.

## 2014-07-22 NOTE — ED Notes (Signed)
Pt arrives POv from home with central chest pressure, radiating to her back and some SOb of breath while sitting. Pain began this AM. States told she has some blockages but no stents. Took 81mg  ASA PTA

## 2014-07-22 NOTE — Progress Notes (Signed)
NP on call notified of pt's hr being in the 50s & going as low as the 40s. Per NP RN is to hold night dose of atenolol. Will continue to monitor the pt. Hoover Brunette

## 2014-07-22 NOTE — H&P (Signed)
[facility] History and Physical  Amanda [name] DUK:[id number] DOB: 05-Sep-1945 DOA: 07/22/2014  Referring physician: Christ Kick, MD PCP: [name], MD   Chief Complaint: Chest Pain  HPI: Amanda [name] is a 69 y.o. female presents with chest pain. Patient has a history of CAD with cath in 2015 showing RCA mild disease hypertension and GERD. She states that the pain started this morning which she states was more of a burning sensation. She states that she took some prilosec and this did ease up the pain. She states pain seemed to go into her back also. She had some nausea no vomiting. She had no arm pain noted. She stats that she had some tingling in her arm on the right side. Patient states that she had slight shortness of breath. She did also have some pressure also. She states that she had some epigastric area burning. She also states that she gets some smothering sensation at night when she lays down. She has no edema noted. Currently she is pain free but states there is some pain in the mid back.   Review of Systems:  Constitutional:  No weight loss, night sweats, Fevers, chills, fatigue.  HEENT:  No headaches, No sneezing, itching, ear ache, nasal congestion, post nasal drip,  Cardio-vascular:  +chest pain, +Orthopnea, +PND, no swelling in lower extremities  GI:  +heartburn, +indigestion, +abdominal pain, +nausea, no vomiting  Resp:  +shortness of breath. no productive cough, No coughing up of blood.  Skin:  no rash or lesions.  GU:  no dysuria, change in color of urine, no urgency or frequency Musculoskeletal:  No joint pain or swelling. + back pain.  Psych:  No change in mood or affect.   Past Medical History  Diagnosis Date  . Stricture of esophagus   . Hiatal hernia   . GERD (gastroesophageal reflux disease)   . Diverticulosis   . Hemorrhoids   . Osteopenia   . Vitamin D deficiency   . Arthritis   . Hypertension   . Back pain   . Melanoma   .  Hyperlipidemia   . Pituitary tumor    Past Surgical History  Procedure Laterality Date  . Back surgery      x 2  . Vaginal hysterectomy  1993  . Excision of skin cancer      Melanoma  . Hemorrhoid banding      X3  . Tubal ligation    . Pelvic laparoscopy  2012    Diag Lap-BSO-lysis of adhesions  . Oophorectomy  2012    BSO  . Foot surgery    . Left heart catheterization with coronary angiogram N/A 02/03/2014    Procedure: LEFT HEART CATHETERIZATION WITH CORONARY ANGIOGRAM;  Surgeon: [name], MD;  Location: [facility] CATH LAB;  Service: Cardiovascular;  Laterality: N/A;   Social History:  reports that she quit smoking about 20 years ago. She does not have any smokeless tobacco history on file. She reports that she does not drink alcohol or use illicit drugs.  No Known Allergies  Family History  Problem Relation Age of Onset  . Heart disease Mother   . Hypertension Mother   . Stroke Mother   . Colon polyps Father   . Heart disease Father     CHF  . Hypertension Father   . Cancer Father     COLON  . Colon cancer Sister   . Colon polyps Sister   . Heart disease Sister   .  Diabetes Sister   . Melanoma Sister   . Colon polyps Brother   . Diabetes Brother   . Heart disease Brother   . Cancer Brother     COLON  . Melanoma Brother   . Melanoma Brother      Prior to Admission medications   Medication Sig Start Date End Date Taking? Authorizing Provider  ALPRAZolam Duanne Moron) 0.5 MG tablet Take 0.25-0.5 mg by mouth 4 (four) times daily as needed for anxiety.   Yes Historical Provider, MD  amLODipine (NORVASC) 5 MG tablet Take 5 mg by mouth daily.  12/03/13  Yes Historical Provider, MD  aspirin EC 81 MG tablet Take 1 tablet (81 mg total) by mouth daily. 01/29/14  Yes Lelon Perla, MD  atenolol (TENORMIN) 25 MG tablet Take 25 mg by mouth daily.    Yes Historical Provider, MD  baclofen (LIORESAL) 10 MG tablet Take 10 mg by mouth 3 (three) times daily as needed for muscle  spasms.   Yes Historical Provider, MD  estradiol (ESTRACE) 1 MG tablet Take 1 tablet (1 mg total) by mouth daily. Patient taking differently: Take 0.5 mg by mouth daily.  05/13/14  Yes Anastasio Auerbach, MD  meloxicam (MOBIC) 15 MG tablet Take 15 mg by mouth daily.  12/29/13  Yes Historical Provider, MD  omeprazole (PRILOSEC) 40 MG capsule Take 1 capsule daily Patient taking differently: Take 40 mg by mouth daily.  12/10/12  Yes Inda Castle, MD  oxyCODONE-acetaminophen (PERCOCET) 10-325 MG per tablet Take 1 tablet by mouth every 4 (four) hours as needed for pain.  12/31/13  Yes Historical Provider, MD  VOLTAREN 1 % GEL Apply 2 g topically daily as needed (pain).  12/04/13  Yes Historical Provider, MD   Physical Exam: Filed Vitals:   07/22/14 1416 07/22/14 1600 07/22/14 1645 07/22/14 1717  BP: 179/77 150/56 152/62 173/56  Pulse: 66 47 56 58  Temp: 97.8 F (36.6 C)     Resp: 18 13 14    Weight: 71.668 kg (158 lb)     SpO2: 99% 100% 98% 99%    Wt Readings from Last 3 Encounters:  07/22/14 71.668 kg (158 lb)  05/13/14 73.029 kg (161 lb)  04/01/14 73.12 kg (161 lb 3.2 oz)    General:  Appears calm and comfortable Eyes: PERRL, normal lids, irises & conjunctiva ENT: grossly normal hearing, lips & tongue Neck: no LAD, masses or thyromegaly Cardiovascular: RRR, no m/r/g. No LE edema. Telemetry: SR, no arrhythmias  Respiratory: CTA bilaterally, no w/r/r. Normal respiratory effort. Abdomen: soft, ntnd Skin: no rash or induration seen on limited exam Musculoskeletal: grossly normal tone BUE/BLE Psychiatric: grossly normal mood and affect, speech fluent and appropriate Neurologic: grossly non-focal.          Labs on Admission:  Basic Metabolic Panel:  Recent Labs Lab 07/22/14 1422  NA 136  K 4.1  CL 101  CO2 26  GLUCOSE 131*  BUN 7  CREATININE 0.83  CALCIUM 9.0   Liver Function Tests: No results for input(s): AST, ALT, ALKPHOS, BILITOT, PROT, ALBUMIN in the last 168  hours. No results for input(s): LIPASE, AMYLASE in the last 168 hours. No results for input(s): AMMONIA in the last 168 hours. CBC:  Recent Labs Lab 07/22/14 1422  WBC 9.5  HGB 14.1  HCT 42.0  MCV 95.0  PLT 189   Cardiac Enzymes: No results for input(s): CKTOTAL, CKMB, CKMBINDEX, TROPONINI in the last 168 hours.  BNP (last 3 results) No results  for input(s): BNP in the last 8760 hours.  ProBNP (last 3 results) No results for input(s): PROBNP in the last 8760 hours.  CBG: No results for input(s): GLUCAP in the last 168 hours.  Radiological Exams on Admission: Dg Chest 2 View  07/22/2014   CLINICAL DATA:  Mid chest pain radiating to the back associated with shortness of breath, history of coronary artery disease, esophageal reflux and stricture, remote history of tobacco use.  EXAM: CHEST  2 VIEW  COMPARISON:  PA and lateral chest of November 29, 2013  FINDINGS: The lungs are adequately inflated. There is no focal infiltrate. There is no pleural effusion. The heart and pulmonary vascularity are normal. The mediastinum is normal in width. The bony thorax is unremarkable.  IMPRESSION: COPD. There is no pneumonia, CHF, nor other acute cardiopulmonary abnormality.   Electronically Signed   By: David  Martinique M.D.   On: 07/22/2014 15:13    EKG: Independently reviewed. No acute changes  Assessment/Plan Active Problems:   Hypertension   GERD (gastroesophageal reflux disease)   CAD (coronary artery disease)   Chest pain   1. Atypical Chest Pain -will admit for observation -will check serial enzymes -ecg in am -cardiology consult -also suggested CT of the chest now which shows CAD no PE and T7-8 disc protrusion  2. GERD -continue with PPI  3. CAD -stress echo done in 2015 showed ischemia in the anteroapical area -cath at the time showed Moderate RCA with patent left sided circulation and normal LV -cardiology consult  4. HTN -will continue with  antihypertensives -follow pressures closely   Code Status: Full Code (must indicate code status--if unknown or must be presumed, indicate so) DVT Prophylaxis:Heparin Family Communication: None (indicate person spoken with, if applicable, with phone number if by telephone) Disposition Plan: Home (indicate anticipated LOS)  Time spent: 65min  Jermarcus Mcfadyen A [facility] Pager [phone]

## 2014-07-22 NOTE — ED Notes (Signed)
Pt placed in a gown with 5 lead, bp cuff and pulse ox

## 2014-07-23 DIAGNOSIS — R079 Chest pain, unspecified: Secondary | ICD-10-CM

## 2014-07-23 DIAGNOSIS — K449 Diaphragmatic hernia without obstruction or gangrene: Secondary | ICD-10-CM | POA: Diagnosis not present

## 2014-07-23 DIAGNOSIS — K222 Esophageal obstruction: Secondary | ICD-10-CM | POA: Diagnosis not present

## 2014-07-23 DIAGNOSIS — R072 Precordial pain: Secondary | ICD-10-CM

## 2014-07-23 DIAGNOSIS — R1013 Epigastric pain: Secondary | ICD-10-CM | POA: Diagnosis not present

## 2014-07-23 DIAGNOSIS — I25119 Atherosclerotic heart disease of native coronary artery with unspecified angina pectoris: Secondary | ICD-10-CM | POA: Diagnosis not present

## 2014-07-23 LAB — TROPONIN I: Troponin I: <0.03 ng/mL (ref <0.031)

## 2014-07-23 MED ORDER — ASPIRIN 81 MG PO CHEW
244.0000 mg | CHEWABLE_TABLET | Freq: Once | ORAL | Status: AC
  Administered 2014-07-23: 244 mg via ORAL
  Filled 2014-07-23: qty 4

## 2014-07-23 MED ORDER — ALPRAZOLAM 0.5 MG PO TABS: 0.2500 mg | ORAL_TABLET | Freq: Four times a day (QID) | ORAL | Status: DC | PRN

## 2014-07-23 NOTE — Progress Notes (Signed)
Retro ur review done.

## 2014-07-23 NOTE — Discharge Summary (Signed)
PATIENT DETAILS Name: Amanda [name] Age: 69 y.o. Sex: female Date of Birth: 19-Jul-1945 MRN: [id number]. Admitting Physician: Allyne Gee, MD NOI:[id number], Jenny Reichmann, MD  Admit Date: 07/22/2014 Discharge date: 07/23/2014  Recommendations for Outpatient Follow-up:  Counsel-regarding compliance to PPI-Had stopped taking.  PRIMARY DISCHARGE DIAGNOSIS:  Active Problems:   Hypertension   GERD (gastroesophageal reflux disease)   CAD (coronary artery disease)   Chest pain      PAST MEDICAL HISTORY: Past Medical History  Diagnosis Date  . Stricture of esophagus   . Hiatal hernia   . GERD (gastroesophageal reflux disease)   . Diverticulosis   . Hemorrhoids   . Osteopenia   . Vitamin D deficiency   . Arthritis   . Hypertension   . Back pain   . Melanoma   . Hyperlipidemia   . Pituitary tumor     DISCHARGE MEDICATIONS: Discharge Medication List as of 07/23/2014  8:44 AM    CONTINUE these medications which have CHANGED   Details  ALPRAZolam (XANAX) 0.5 MG tablet Take 0.5-1 tablets (0.25-0.5 mg total) by mouth 4 (four) times daily as needed for anxiety., Starting 07/23/2014, Until Discontinued, Print      CONTINUE these medications which have NOT CHANGED   Details  amLODipine (NORVASC) 5 MG tablet Take 5 mg by mouth daily. , Starting 12/03/2013, Until Discontinued, Historical Med    aspirin EC 81 MG tablet Take 1 tablet (81 mg total) by mouth daily., Starting 01/29/2014, Until Discontinued, No Print    atenolol (TENORMIN) 25 MG tablet Take 25 mg by mouth daily. , Until Discontinued, Historical Med    baclofen (LIORESAL) 10 MG tablet Take 10 mg by mouth 3 (three) times daily as needed for muscle spasms., Until Discontinued, Historical Med    estradiol (ESTRACE) 1 MG tablet Take 1 tablet (1 mg total) by mouth daily., Starting 05/13/2014, Until Discontinued, Normal    meloxicam (MOBIC) 15 MG tablet Take 15 mg by mouth daily. , Starting 12/29/2013, Until Discontinued, Historical Med     omeprazole (PRILOSEC) 40 MG capsule Take 1 capsule daily, Normal    oxyCODONE-acetaminophen (PERCOCET) 10-325 MG per tablet Take 1 tablet by mouth every 4 (four) hours as needed for pain. , Starting 12/31/2013, Until Discontinued, Historical Med    VOLTAREN 1 % GEL Apply 2 g topically daily as needed (pain). , Starting 12/04/2013, Until Discontinued, Historical Med        ALLERGIES:  No Known Allergies  BRIEF HPI:  See H&P, Labs, Consult and Test reports for all details in brief, patient was admitted for evaluation of chest pain.  CONSULTATIONS:   None  PERTINENT RADIOLOGIC STUDIES: Dg Chest 2 View  07/22/2014   CLINICAL DATA:  Mid chest pain radiating to the back associated with shortness of breath, history of coronary artery disease, esophageal reflux and stricture, remote history of tobacco use.  EXAM: CHEST  2 VIEW  COMPARISON:  PA and lateral chest of November 29, 2013  FINDINGS: The lungs are adequately inflated. There is no focal infiltrate. There is no pleural effusion. The heart and pulmonary vascularity are normal. The mediastinum is normal in width. The bony thorax is unremarkable.  IMPRESSION: COPD. There is no pneumonia, CHF, nor other acute cardiopulmonary abnormality.   Electronically Signed   By: David  Martinique M.D.   On: 07/22/2014 15:13   Ct Angio Chest Pe W/cm &/or Wo Cm  07/22/2014   CLINICAL DATA:  Chest pain  EXAM: CT ANGIOGRAPHY CHEST WITH CONTRAST  TECHNIQUE: Multidetector CT imaging of the chest was performed using the standard protocol during bolus administration of intravenous contrast. Multiplanar CT image reconstructions and MIPs were obtained to evaluate the vascular anatomy.  CONTRAST:  158mL OMNIPAQUE IOHEXOL 350 MG/ML SOLN  COMPARISON:  None.  FINDINGS: There is adequate opacification of the pulmonary arteries. There is no pulmonary embolus. The main pulmonary artery, right main pulmonary artery and left main pulmonary arteries are normal in size. The heart  size is normal. There is no pericardial effusion. There is mild coronary artery atherosclerosis in the LAD.  The lungs are clear. There is no focal consolidation, pleural effusion or pneumothorax.  There is no axillary, hilar, or mediastinal adenopathy.  There is no lytic or blastic osseous lesion. There is a mild calcified disc protrusion at T7-8.  There is a small hiatal hernia.  Review of the MIP images confirms the above findings.  IMPRESSION: 1. No evidence of pulmonary embolus. 2. Mild coronary artery atherosclerosis in the LAD. 3. Mild calcified disc protrusion at T7-8.   Electronically Signed   By: Kathreen Devoid   On: 07/22/2014 20:35     PERTINENT LAB RESULTS: CBC:  Recent Labs  07/22/14 1422 07/22/14 2127  WBC 9.5 6.4  HGB 14.1 12.4  HCT 42.0 37.8  PLT 189 167   CMET CMP     Component Value Date/Time   NA 136 07/22/2014 1422   K 4.1 07/22/2014 1422   CL 101 07/22/2014 1422   CO2 26 07/22/2014 1422   GLUCOSE 131* 07/22/2014 1422   BUN 7 07/22/2014 1422   CREATININE 0.75 07/22/2014 2127   CREATININE 0.82 01/29/2014 1509   CALCIUM 9.0 07/22/2014 1422   PROT 6.7 11/08/2010 1012   ALBUMIN 4.2 11/08/2010 1012   AST 13 11/08/2010 1012   ALT <8 11/08/2010 1012   ALKPHOS 51 11/08/2010 1012   BILITOT 0.3 11/08/2010 1012   GFRNONAA >60 07/22/2014 2127   GFRNONAA 75 01/29/2014 1509   GFRAA >60 07/22/2014 2127   GFRAA 86 01/29/2014 1509    GFR Estimated Creatinine Clearance: 68.6 mL/min (by C-G formula based on Cr of 0.75). No results for input(s): LIPASE, AMYLASE in the last 72 hours.  Recent Labs  07/22/14 2127 07/23/14 0215  TROPONINI <0.03 <0.03   Invalid input(s): POCBNP No results for input(s): DDIMER in the last 72 hours. No results for input(s): HGBA1C in the last 72 hours. No results for input(s): CHOL, HDL, LDLCALC, TRIG, CHOLHDL, LDLDIRECT in the last 72 hours. No results for input(s): TSH, T4TOTAL, T3FREE, THYROIDAB in the last 72 hours.  Invalid  input(s): FREET3 No results for input(s): VITAMINB12, FOLATE, FERRITIN, TIBC, IRON, RETICCTPCT in the last 72 hours. Coags: No results for input(s): INR in the last 72 hours.  Invalid input(s): PT Microbiology: No results found for this or any previous visit (from the past 240 hour(s)).   BRIEF HOSPITAL COURSE:   Active Problems:   Chest pain: patient was admitted overnight for evaluation of chest pain-"burning" in nature. EKG/Troponin were negative. Recent LHC revealed 70% nondominant right coronary artery but otherwise no obstructive disease.CTA Chest was negative for PE. Seen by Cardiology today, no further recommendations-ok to discharge. Suspect etiology of chest pain was GI related, patient was not very compliant to PPI-I have asked her to go back on it. She will follow up with her PCP on 5/10 for a post hospital discharge visit.   Rest of her medical problems were stable during this short hospital stay  TODAY-DAY OF DISCHARGE:  Subjective:   Amanda [name] today has no headache,no chest abdominal pain,no new weakness tingling or numbness, feels much better wants to go home today.   Objective:   Blood pressure 119/57, pulse 62, temperature 98.6 F (37 C), temperature source Oral, resp. rate 16, height 5\' 5"  (1.651 m), weight 71.487 kg (157 lb 9.6 oz), SpO2 98 %. No intake or output data in the 24 hours ending 07/23/14 1046 Filed Weights   07/22/14 1416 07/22/14 2043  Weight: 71.668 kg (158 lb) 71.487 kg (157 lb 9.6 oz)    Exam Awake Alert, Oriented *3, No new F.N deficits, Normal affect [place].AT,PERRAL Supple Neck,No JVD, No cervical lymphadenopathy appriciated.  Symmetrical Chest wall movement, Good air movement bilaterally, CTAB RRR,No Gallops,Rubs or new Murmurs, No Parasternal Heave +ve B.Sounds, Abd Soft, Non tender, No organomegaly appriciated, No rebound -guarding or rigidity. No Cyanosis, Clubbing or edema, No new Rash or bruise  DISCHARGE  CONDITION: Stable  DISPOSITION: Home  DISCHARGE INSTRUCTIONS:    Activity:  As tolerated  Diet recommendation: Heart Healthy diet   Discharge Instructions    Call MD for:  severe uncontrolled pain    Complete by:  As directed      Diet - low sodium heart healthy    Complete by:  As directed      Increase activity slowly    Complete by:  As directed            Follow-up Information    Follow up with [name], MD. Go on 07/27/2014.   Specialty:  Internal Medicine   Why:  hospital follow up at 10:45 am   Contact information:   [address] [place] [address] [phone]       Total Time spent on discharge equals 15 minutes.  SignedOren Binet 07/23/2014 10:46 AM

## 2014-07-23 NOTE — Progress Notes (Signed)
Pt d/c'd home with family. She chose to ambulate out. IV and tele d/c'd. D/c instructions and prescriptions were reviewed and given. All questions answered. Pt verbalized understanding. No belongings left in the room.

## 2014-07-23 NOTE — Consult Note (Signed)
Primary cardiologist: Dr [name]  HPI: 69 yo female for evaluation of chest pain. Stress echocardiogram October 2015 showed chest pain, electocardiographic changes and possible anteroapical wall motion abnormality. Cardiac catheterization November 2015 showed a 70% small nondominant right coronary artery and an ejection fraction of 55-65%. Medical therapy recommended. Patient notes some fatigue but denies significant dyspnea on exertion, orthopnea, PND or pedal edema. She does not have exertional chest pain. At approximately 8:30 yesterday morning she developed stomach pain radiating to her chest and back. It was described as a burning sensation. No associated nausea, diaphoresis or dyspnea. Her symptoms lasted 2 hours. Resolved with Prilosec. Note her pain was not pleuritic or positional and no associated water brash. She's had no pain since. Cardiology asked to evaluate.  Medications Prior to Admission  Medication Sig Dispense Refill  . ALPRAZolam (XANAX) 0.5 MG tablet Take 0.25-0.5 mg by mouth 4 (four) times daily as needed for anxiety.    Marland Kitchen amLODipine (NORVASC) 5 MG tablet Take 5 mg by mouth daily.     Marland Kitchen aspirin EC 81 MG tablet Take 1 tablet (81 mg total) by mouth daily. 90 tablet 3  . atenolol (TENORMIN) 25 MG tablet Take 25 mg by mouth daily.     . baclofen (LIORESAL) 10 MG tablet Take 10 mg by mouth 3 (three) times daily as needed for muscle spasms.    Marland Kitchen estradiol (ESTRACE) 1 MG tablet Take 1 tablet (1 mg total) by mouth daily. (Patient taking differently: Take 0.5 mg by mouth daily. ) 90 tablet 3  . meloxicam (MOBIC) 15 MG tablet Take 15 mg by mouth daily.     Marland Kitchen omeprazole (PRILOSEC) 40 MG capsule Take 1 capsule daily (Patient taking differently: Take 40 mg by mouth daily. ) 90 capsule 3  . oxyCODONE-acetaminophen (PERCOCET) 10-325 MG per tablet Take 1 tablet by mouth every 4 (four) hours as needed for pain.     Marland Kitchen VOLTAREN 1 % GEL Apply 2 g topically daily as needed (pain).       No  Known Allergies  Past Medical History  Diagnosis Date  . Stricture of esophagus   . Hiatal hernia   . GERD (gastroesophageal reflux disease)   . Diverticulosis   . Hemorrhoids   . Osteopenia   . Vitamin D deficiency   . Arthritis   . Hypertension   . Back pain   . Melanoma   . Hyperlipidemia   . Pituitary tumor     Past Surgical History  Procedure Laterality Date  . Back surgery      x 2  . Vaginal hysterectomy  1993  . Excision of skin cancer      Melanoma  . Hemorrhoid banding      X3  . Tubal ligation    . Pelvic laparoscopy  2012    Diag Lap-BSO-lysis of adhesions  . Oophorectomy  2012    BSO  . Foot surgery    . Left heart catheterization with coronary angiogram N/A 02/03/2014    Procedure: LEFT HEART CATHETERIZATION WITH CORONARY ANGIOGRAM;  Surgeon: [name], MD;  Location: [facility] CATH LAB;  Service: Cardiovascular;  Laterality: N/A;    History   Social History  . Marital Status: Married    Spouse Name: N/A  . Number of Children: 3  . Years of Education: N/A   Occupational History  . Disabled    Social History Main Topics  . Smoking status: Former Smoker    Quit date:  03/19/1994  . Smokeless tobacco: Not on file  . Alcohol Use: No  . Drug Use: No  . Sexual Activity: Yes    Birth Control/ Protection: Surgical     Comment: 1st intercourse 69 yo-Fewer than 5 partners   Other Topics Concern  . Not on file   Social History Narrative    Family History  Problem Relation Age of Onset  . Heart disease Mother   . Hypertension Mother   . Stroke Mother   . Colon polyps Father   . Heart disease Father     CHF  . Hypertension Father   . Cancer Father     COLON  . Colon cancer Sister   . Colon polyps Sister   . Heart disease Sister   . Diabetes Sister   . Melanoma Sister   . Colon polyps Brother   . Diabetes Brother   . Heart disease Brother   . Cancer Brother     COLON  . Melanoma Brother   . Melanoma Brother     ROS:  Fatigue and  mildly productive cough but no fevers or chills, hemoptysis, dysphasia, odynophagia, melena, hematochezia, dysuria, hematuria, rash, seizure activity, orthopnea, PND, pedal edema, claudication. Remaining systems are negative.  Physical Exam:   Blood pressure 119/57, pulse 62, temperature 98.6 F (37 C), temperature source Oral, resp. rate 16, height '5\' 5"'  (1.651 m), weight 157 lb 9.6 oz (71.487 kg), SpO2 98 %.  General:  Well developed/well nourished in NAD Skin warm/dry Patient not depressed No peripheral clubbing Back-normal HEENT-normal/normal eyelids Neck supple/normal carotid upstroke bilaterally; no bruits; no JVD; no thyromegaly chest - CTA/ normal expansion CV - RRR/normal S1 and S2; no murmurs, rubs or gallops;  PMI nondisplaced Abdomen -NT/ND, no HSM, no mass, + bowel sounds, no bruit 2+ femoral pulses, no bruits Ext-no edema, chords, 2+ DP Neuro-grossly nonfocal  ECG sinus rhythm with no ST changes.  Results for orders placed or performed during the hospital encounter of 07/22/14 (from the past 48 hour(s))  CBC     Status: None   Collection Time: 07/22/14  2:22 PM  Result Value Ref Range   WBC 9.5 4.0 - 10.5 K/uL   RBC 4.42 3.87 - 5.11 MIL/uL   Hemoglobin 14.1 12.0 - 15.0 g/dL   HCT 42.0 36.0 - 46.0 %   MCV 95.0 78.0 - 100.0 fL   MCH 31.9 26.0 - 34.0 pg   MCHC 33.6 30.0 - 36.0 g/dL   RDW 12.4 11.5 - 15.5 %   Platelets 189 150 - 400 K/uL  Basic metabolic panel     Status: Abnormal   Collection Time: 07/22/14  2:22 PM  Result Value Ref Range   Sodium 136 135 - 145 mmol/L   Potassium 4.1 3.5 - 5.1 mmol/L   Chloride 101 101 - 111 mmol/L   CO2 26 22 - 32 mmol/L   Glucose, Bld 131 (H) 70 - 99 mg/dL   BUN 7 6 - 20 mg/dL   Creatinine, Ser 0.83 0.44 - 1.00 mg/dL   Calcium 9.0 8.9 - 10.3 mg/dL   GFR calc non Af Amer >60 >60 mL/min   GFR calc Af Amer >60 >60 mL/min    Comment: (NOTE) The eGFR has been calculated using the CKD EPI equation. This calculation has not  been validated in all clinical situations. eGFR's persistently <60 mL/min signify possible Chronic Kidney Disease.    Anion gap 9 5 - 15  I-stat troponin, ED  (not at  [facility], [facility])     Status: None   Collection Time: 07/22/14  2:42 PM  Result Value Ref Range   Troponin i, poc 0.00 0.00 - 0.08 ng/mL   Comment 3            Comment: Due to the release kinetics of cTnI, a negative result within the first hours of the onset of symptoms does not rule out myocardial infarction with certainty. If myocardial infarction is still suspected, repeat the test at appropriate intervals.   I-stat troponin, ED     Status: None   Collection Time: 07/22/14  6:12 PM  Result Value Ref Range   Troponin i, poc 0.01 0.00 - 0.08 ng/mL   Comment 3            Comment: Due to the release kinetics of cTnI, a negative result within the first hours of the onset of symptoms does not rule out myocardial infarction with certainty. If myocardial infarction is still suspected, repeat the test at appropriate intervals.   Troponin I-serum (0, 3, 6 hours)     Status: None   Collection Time: 07/22/14  9:27 PM  Result Value Ref Range   Troponin I <0.03 <0.031 ng/mL    Comment:        NO INDICATION OF MYOCARDIAL INJURY.   CBC     Status: None   Collection Time: 07/22/14  9:27 PM  Result Value Ref Range   WBC 6.4 4.0 - 10.5 K/uL   RBC 3.97 3.87 - 5.11 MIL/uL   Hemoglobin 12.4 12.0 - 15.0 g/dL   HCT 37.8 36.0 - 46.0 %   MCV 95.2 78.0 - 100.0 fL   MCH 31.2 26.0 - 34.0 pg   MCHC 32.8 30.0 - 36.0 g/dL   RDW 12.4 11.5 - 15.5 %   Platelets 167 150 - 400 K/uL  Creatinine, serum     Status: None   Collection Time: 07/22/14  9:27 PM  Result Value Ref Range   Creatinine, Ser 0.75 0.44 - 1.00 mg/dL   GFR calc non Af Amer >60 >60 mL/min   GFR calc Af Amer >60 >60 mL/min    Comment: (NOTE) The eGFR has been calculated using the CKD EPI equation. This calculation has not been validated in all clinical  situations. eGFR's persistently <60 mL/min signify possible Chronic Kidney Disease.   Troponin I-serum (0, 3, 6 hours)     Status: None   Collection Time: 07/23/14  2:15 AM  Result Value Ref Range   Troponin I <0.03 <0.031 ng/mL    Comment:        NO INDICATION OF MYOCARDIAL INJURY.     Dg Chest 2 View  07/22/2014   CLINICAL DATA:  Mid chest pain radiating to the back associated with shortness of breath, history of coronary artery disease, esophageal reflux and stricture, remote history of tobacco use.  EXAM: CHEST  2 VIEW  COMPARISON:  PA and lateral chest of November 29, 2013  FINDINGS: The lungs are adequately inflated. There is no focal infiltrate. There is no pleural effusion. The heart and pulmonary vascularity are normal. The mediastinum is normal in width. The bony thorax is unremarkable.  IMPRESSION: COPD. There is no pneumonia, CHF, nor other acute cardiopulmonary abnormality.   Electronically Signed   By: David  Martinique M.D.   On: 07/22/2014 15:13   Ct Angio Chest Pe W/cm &/or Wo Cm  07/22/2014   CLINICAL DATA:  Chest pain  EXAM: CT ANGIOGRAPHY CHEST  WITH CONTRAST  TECHNIQUE: Multidetector CT imaging of the chest was performed using the standard protocol during bolus administration of intravenous contrast. Multiplanar CT image reconstructions and MIPs were obtained to evaluate the vascular anatomy.  CONTRAST:  133m OMNIPAQUE IOHEXOL 350 MG/ML SOLN  COMPARISON:  None.  FINDINGS: There is adequate opacification of the pulmonary arteries. There is no pulmonary embolus. The main pulmonary artery, right main pulmonary artery and left main pulmonary arteries are normal in size. The heart size is normal. There is no pericardial effusion. There is mild coronary artery atherosclerosis in the LAD.  The lungs are clear. There is no focal consolidation, pleural effusion or pneumothorax.  There is no axillary, hilar, or mediastinal adenopathy.  There is no lytic or blastic osseous lesion. There is a  mild calcified disc protrusion at T7-8.  There is a small hiatal hernia.  Review of the MIP images confirms the above findings.  IMPRESSION: 1. No evidence of pulmonary embolus. 2. Mild coronary artery atherosclerosis in the LAD. 3. Mild calcified disc protrusion at T7-8.   Electronically Signed   By: HKathreen Devoid  On: 07/22/2014 20:35    Assessment/Plan 1 chest pain symptoms are atypical and possibly GI related. Enzymes are negative. Electrocardiogram shows no ST changes. Symptoms resolved with Prilosec. Recent cardiac catheterization revealed a 70% nondominant right coronary artery but otherwise no obstructive disease. Would not pursue further ischemia evaluation. Would treat with Prilosec long-term. We can consider further evaluation if she has recurrent symptoms in the future. 2 coronary artery disease-continue aspirin. Intolerant to statins. 3 hypertension-continue preadmission medications. 4 gastroesophageal reflux disease-resume Prilosec. Please call with questions BKirk RuthsMD 07/23/2014, 7:57 AM

## 2014-09-16 ENCOUNTER — Other Ambulatory Visit: Payer: Self-pay | Admitting: Neurosurgery

## 2014-09-16 DIAGNOSIS — M5126 Other intervertebral disc displacement, lumbar region: Secondary | ICD-10-CM

## 2014-09-22 ENCOUNTER — Ambulatory Visit
Admission: RE | Admit: 2014-09-22 | Discharge: 2014-09-22 | Disposition: A | Discharge status: Home / Self Care | Payer: Medicare Other | Source: Ambulatory Visit | Attending: Neurosurgery | Admitting: Neurosurgery

## 2014-09-22 DIAGNOSIS — M5126 Other intervertebral disc displacement, lumbar region: Secondary | ICD-10-CM

## 2014-09-22 MED ORDER — GADOBENATE DIMEGLUMINE 529 MG/ML IV SOLN
15.0000 mL | Freq: Once | INTRAVENOUS | Status: AC | PRN
  Administered 2014-09-22: 15 mL via INTRAVENOUS

## 2014-09-24 ENCOUNTER — Other Ambulatory Visit: Payer: Self-pay | Admitting: Neurosurgery

## 2014-09-24 DIAGNOSIS — M5126 Other intervertebral disc displacement, lumbar region: Secondary | ICD-10-CM

## 2014-09-30 ENCOUNTER — Other Ambulatory Visit: Payer: Self-pay | Admitting: Neurosurgery

## 2014-09-30 DIAGNOSIS — M5126 Other intervertebral disc displacement, lumbar region: Secondary | ICD-10-CM

## 2014-10-05 ENCOUNTER — Ambulatory Visit
Admission: RE | Admit: 2014-10-05 | Discharge: 2014-10-05 | Disposition: A | Discharge status: Home / Self Care | Payer: Medicare Other | Source: Ambulatory Visit | Attending: Neurosurgery | Admitting: Neurosurgery

## 2014-10-05 DIAGNOSIS — M5126 Other intervertebral disc displacement, lumbar region: Secondary | ICD-10-CM

## 2014-10-05 MED ORDER — METHYLPREDNISOLONE ACETATE 40 MG/ML INJ SUSP (RADIOLOG
120.0000 mg | Freq: Once | INTRAMUSCULAR | Status: AC
  Administered 2014-10-05: 120 mg via EPIDURAL

## 2014-10-05 MED ORDER — IOHEXOL 180 MG/ML  SOLN
1.0000 mL | Freq: Once | INTRAMUSCULAR | Status: AC | PRN
Start: 2014-10-05 — End: 2014-10-05
  Administered 2014-10-05: 1 mL via EPIDURAL

## 2014-10-05 NOTE — Discharge Instructions (Signed)

## 2014-12-15 ENCOUNTER — Other Ambulatory Visit: Payer: Self-pay | Admitting: Neurosurgery

## 2014-12-15 DIAGNOSIS — M545 Low back pain: Principal | ICD-10-CM

## 2014-12-15 DIAGNOSIS — M5431 Sciatica, right side: Secondary | ICD-10-CM

## 2014-12-15 DIAGNOSIS — G8929 Other chronic pain: Secondary | ICD-10-CM

## 2014-12-21 ENCOUNTER — Ambulatory Visit
Admission: RE | Admit: 2014-12-21 | Discharge: 2014-12-21 | Disposition: A | Discharge status: Home / Self Care | Payer: Medicare Other | Source: Ambulatory Visit | Attending: Neurosurgery | Admitting: Neurosurgery

## 2014-12-21 DIAGNOSIS — M545 Low back pain: Principal | ICD-10-CM

## 2014-12-21 DIAGNOSIS — G8929 Other chronic pain: Secondary | ICD-10-CM

## 2014-12-21 DIAGNOSIS — M5431 Sciatica, right side: Secondary | ICD-10-CM

## 2014-12-21 MED ORDER — METHYLPREDNISOLONE ACETATE 40 MG/ML INJ SUSP (RADIOLOG
120.0000 mg | Freq: Once | INTRAMUSCULAR | Status: AC
  Administered 2014-12-21: 120 mg via EPIDURAL

## 2014-12-21 MED ORDER — IOHEXOL 180 MG/ML  SOLN
1.0000 mL | Freq: Once | INTRAMUSCULAR | Status: DC | PRN
  Administered 2014-12-21: 1 mL via EPIDURAL

## 2014-12-21 NOTE — Discharge Instructions (Signed)

## 2015-04-27 DIAGNOSIS — H524 Presbyopia: Secondary | ICD-10-CM | POA: Diagnosis not present

## 2015-04-27 DIAGNOSIS — H26493 Other secondary cataract, bilateral: Secondary | ICD-10-CM | POA: Diagnosis not present

## 2015-04-28 DIAGNOSIS — Z87898 Personal history of other specified conditions: Secondary | ICD-10-CM | POA: Diagnosis not present

## 2015-04-28 DIAGNOSIS — E237 Disorder of pituitary gland, unspecified: Secondary | ICD-10-CM | POA: Diagnosis not present

## 2015-04-28 DIAGNOSIS — M5126 Other intervertebral disc displacement, lumbar region: Secondary | ICD-10-CM | POA: Diagnosis not present

## 2015-05-20 DIAGNOSIS — M5126 Other intervertebral disc displacement, lumbar region: Secondary | ICD-10-CM | POA: Diagnosis not present

## 2015-05-20 DIAGNOSIS — M542 Cervicalgia: Secondary | ICD-10-CM | POA: Diagnosis not present

## 2015-05-27 ENCOUNTER — Other Ambulatory Visit: Payer: Self-pay | Admitting: Neurosurgery

## 2015-05-27 ENCOUNTER — Other Ambulatory Visit: Payer: Self-pay

## 2015-05-27 DIAGNOSIS — M5126 Other intervertebral disc displacement, lumbar region: Secondary | ICD-10-CM

## 2015-05-27 DIAGNOSIS — Z1231 Encounter for screening mammogram for malignant neoplasm of breast: Secondary | ICD-10-CM

## 2015-05-30 ENCOUNTER — Ambulatory Visit (INDEPENDENT_AMBULATORY_CARE_PROVIDER_SITE_OTHER): Payer: PPO | Admitting: Physician Assistant

## 2015-05-30 ENCOUNTER — Encounter: Payer: Self-pay | Admitting: Physician Assistant

## 2015-05-30 VITALS — BP 128/78 | HR 60 | Ht 65.0 in | Wt 164.0 lb

## 2015-05-30 DIAGNOSIS — I10 Essential (primary) hypertension: Secondary | ICD-10-CM | POA: Diagnosis not present

## 2015-05-30 DIAGNOSIS — I25118 Atherosclerotic heart disease of native coronary artery with other forms of angina pectoris: Secondary | ICD-10-CM

## 2015-05-30 DIAGNOSIS — R079 Chest pain, unspecified: Secondary | ICD-10-CM | POA: Diagnosis not present

## 2015-05-30 LAB — BASIC METABOLIC PANEL
BUN: 11 mg/dL (ref 7–25)
CHLORIDE: 105 mmol/L (ref 98–110)
CO2: 28 mmol/L (ref 20–31)
Calcium: 8.7 mg/dL (ref 8.6–10.4)
Creat: 0.72 mg/dL (ref 0.50–0.99)
Glucose, Bld: 87 mg/dL (ref 65–99)
Potassium: 4 mmol/L (ref 3.5–5.3)
Sodium: 142 mmol/L (ref 135–146)

## 2015-05-30 LAB — TSH: TSH: 1.93 mIU/L

## 2015-05-30 NOTE — Assessment & Plan Note (Signed)
Blood pressure well controlled

## 2015-05-30 NOTE — Assessment & Plan Note (Signed)
Patient haven't some atypical chest pain. See above. If decreasing her caffeine intake does not improve her symptoms will order nuclear stress test.

## 2015-05-30 NOTE — Patient Instructions (Signed)
Medication Instructions:   Your physician recommends that you continue on your current medications as directed. Please refer to the Current Medication list given to you today.   If you need a refill on your cardiac medications before your next appointment, please call your pharmacy.  Labwork: BMET  TSH    Testing/Procedures: NONE ORDER TODAY    Follow-Up:  WITH LENZE IN 2 WEEKS PER LENZE                        WITH DR CRENSHAW IN 1 TO 2 MONTHS     Any Other Special Instructions Will Be Listed Below (If Applicable).

## 2015-05-30 NOTE — Assessment & Plan Note (Signed)
Patient is complaining of chest pain at rest associated with anxiety and sweating relieved with walking around in the cold air. She has no exertional symptoms. She had abnormal stress echo in the past leading to cardiac catheterization that showed 70% small nondominant RCA. She is drinking excessive amounts of caffeine and sweet tea. I had along discussion with the patient about decreasing her caffeine and sugar intake. She should be checked for diabetes by her primary care. We'll check TSH today. I will see her back in 2 weeks. If her symptoms don't improve with decreasing her caffeine intake will proceed with nuclear stress test. Follow-up with Dr. [name] in 1-2 months.

## 2015-05-30 NOTE — Progress Notes (Signed)
Cardiology Office Note   Date:  05/30/2015   ID:  Amanda [name], DOB 19-May-1945, MRN [id number]  PCP:  Amanda Ruddle, MD  Cardiologist:  Dr. [name] Chief Complaint: Chest tightness    History of Present Illness: Amanda [name] is a 70 y.o. female who presents for chest pain.  She had a Stress echocardiogram October 2015 showed chest pain, electocardiographic changes and possible anteroapical wall motion abnormality. Cardiac catheterization November 2015 showed a 70% small nondominant right coronary artery and an ejection fraction of 55-65%. Medical therapy recommended. She has been treated with aspirin and beta blocker. She is intolerant to statins. She also has hypertension. Last saw Dr. [name]  in the hospital 07/2014 for atypical chest pain felt to be GI. No further evaluation was recommended. He was treated with Prilosec.  Patient comes in today accompanied by her husband. She complains of tightening up all over her chest and arms and becoming quite anxious and sweaty. It usually occurs when she is watching TV. She gets up and walks outside into the cool air and the pain eases within 15 minutes. She says she feels like she's having anxiety attacks and her primary care gave her Xanax. This has helped some but not a lot. Symptoms occurs every couple days and have been going on for over a year. She says her heart may race with it. She doesn't really become dizzy. She can do her housework and gardening without any chest pain, palpitations, dyspnea, dyspnea on exertion, dizziness or presyncope. She says she does get tired more easily with her housework. The patient admits to drinking 3-4 cups of coffee daily, 4 cups of sweet tea daily and a Coke every day. She actually took her blood sugar during one of the episodes and it was 67 and she ate some peanut butter and felt better. She has never had her thyroid checked or been checked for diabetes.   Past Medical History  Diagnosis Date  .  Stricture of esophagus   . Hiatal hernia   . GERD (gastroesophageal reflux disease)   . Diverticulosis   . Hemorrhoids   . Osteopenia   . Vitamin D deficiency   . Arthritis   . Hypertension   . Back pain   . Melanoma ([place])   . Hyperlipidemia   . Pituitary tumor [facility])     Past Surgical History  Procedure Laterality Date  . Back surgery      x 2  . Vaginal hysterectomy  1993  . Excision of skin cancer      Melanoma  . Hemorrhoid banding      X3  . Tubal ligation    . Pelvic laparoscopy  2012    Diag Lap-BSO-lysis of adhesions  . Oophorectomy  2012    BSO  . Foot surgery    . Left heart catheterization with coronary angiogram N/A 02/03/2014    Procedure: LEFT HEART CATHETERIZATION WITH CORONARY ANGIOGRAM;  Surgeon: Amanda Ohara, MD;  Location: [facility] CATH LAB;  Service: Cardiovascular;  Laterality: N/A;     Current Outpatient Prescriptions  Medication Sig Dispense Refill  . ALPRAZolam (XANAX) 0.5 MG tablet Take 0.5-1 tablets (0.25-0.5 mg total) by mouth 4 (four) times daily as needed for anxiety. 20 tablet 0  . amLODipine (NORVASC) 5 MG tablet Take 5 mg by mouth daily.     Marland Kitchen aspirin EC 81 MG tablet Take 1 tablet (81 mg total) by mouth daily. 90 tablet 3  . atenolol (TENORMIN) 25 MG  tablet Take 25 mg by mouth daily.     . baclofen (LIORESAL) 10 MG tablet Take 10 mg by mouth 3 (three) times daily as needed for muscle spasms.    Marland Kitchen estradiol (ESTRACE) 1 MG tablet Take 1 tablet (1 mg total) by mouth daily. (Patient taking differently: Take 0.5 mg by mouth daily. ) 90 tablet 3  . HYDROcodone-acetaminophen (NORCO) 10-325 MG tablet Take 1-2 tablets by mouth every 6 (six) hours as needed.  0  . meloxicam (MOBIC) 15 MG tablet Take 15 mg by mouth daily.     Marland Kitchen omeprazole (PRILOSEC) 40 MG capsule Take 1 capsule daily (Patient taking differently: Take 40 mg by mouth daily. ) 90 capsule 3  . VOLTAREN 1 % GEL Apply 2 g topically daily as needed (pain).      No current  facility-administered medications for this visit.    Allergies:   Review of patient's allergies indicates no known allergies.    Social History:  The patient  reports that she quit smoking about 21 years ago. She does not have any smokeless tobacco history on file. She reports that she does not drink alcohol or use illicit drugs.   Family History:  The patient's    family history includes Cancer in her brother and father; Colon cancer in her sister; Colon polyps in her brother, father, and sister; Diabetes in her brother and sister; Heart disease in her brother, father, mother, and sister; Hypertension in her father and mother; Melanoma in her brother, brother, and sister; Stroke in her mother.    ROS:  Please see the history of present illness.   Otherwise, review of systems are positive for snoring, constipation, chronic back pain and muscle aches, easy bruising.   All other systems are reviewed and negative.    PHYSICAL EXAM: VS:  BP 128/78 mmHg  Pulse 60  Ht 5\' 5"  (1.651 m)  Wt 164 lb (74.39 kg)  BMI 27.29 kg/m2 , BMI Body mass index is 27.29 kg/(m^2). GEN: Well nourished, well developed, in no acute distress Neck: no JVD, HJR, carotid bruits, or masses Cardiac:  RRR; positive S4, no murmurs, no rubs, thrill or heave,  Respiratory:  clear to auscultation bilaterally, normal work of breathing GI: soft, nontender, nondistended, + BS MS: no deformity or atrophy Extremities: without cyanosis, clubbing, edema, good distal pulses bilaterally.  Skin: warm and dry, no rash Neuro:  Strength and sensation are intact    EKG:  EKG is ordered today. The ekg ordered today demonstrates normal sinus rhythm, nonspecific ST-T wave changes, no acute change Recent Labs: 07/22/2014: BUN 7; Creatinine, Ser 0.75; Hemoglobin 12.4; Platelets 167; Potassium 4.1; Sodium 136    Lipid Panel No results found for: CHOL, TRIG, HDL, CHOLHDL, VLDL, LDLCALC, LDLDIRECT    Wt Readings from Last 3 Encounters:   05/30/15 164 lb (74.39 kg)  07/22/14 157 lb 9.6 oz (71.487 kg)  05/13/14 161 lb (73.029 kg)      Other studies Reviewed: Additional studies/ records that were reviewed today include and review of the records demonstrates:  Coronary angiography: Coronary dominance: left  Left mainstem: Short segment, widely patent  Left anterior descending (LAD): Patent, large caliber vessel to the LV apex. Patent diagonals. The mid-LAD has mild diffuse nonobstructive plaque.  Left circumflex (LCx): Large, dominant vessel, no obstructive disease. The OM branches are patent.    Right coronary artery (RCA): Small, nondominant vessel. There is a 70% stenosis in the mid-vessel before the origin of a small  RV marginal branch  Left ventriculography: Left ventricular systolic function is normal, LVEF is estimated at 55-65%, there is no significant mitral regurgitation   Estimated Blood Loss: minimal  Final Conclusions:   1. Moderate stenosis of a small nondominant RCA 2. Widely patent left main, LAD, and dominant left circumflex 3. Normal LV function  Recommendations: medical therapy.  Sherren Mocha MD, [facility] 02/03/2014, 11:26 AM  Stress echo 12/2013 Impressions:  - Abnormal stress echo. Develops chest pressure with exercise   associated with EKG changes consistent with ischemia. Echo images   suggest probable reversible anteroapical wall motion abnormality   ASSESSMENT AND PLAN: Chest pain Patient is complaining of chest pain at rest associated with anxiety and sweating relieved with walking around in the cold air. She has no exertional symptoms. She had abnormal stress echo in the past leading to cardiac catheterization that showed 70% small nondominant RCA. She is drinking excessive amounts of caffeine and sweet tea. I had along discussion with the patient about decreasing her caffeine and sugar intake. She should be checked for diabetes by her primary care. We'll check TSH today. I will see  her back in 2 weeks. If her symptoms don't improve with decreasing her caffeine intake will proceed with nuclear stress test. Follow-up with Dr. [name] in 1-2 months.  Hypertension Blood pressure well controlled  CAD (coronary artery disease) Patient haven't some atypical chest pain. See above. If decreasing her caffeine intake does not improve her symptoms will order nuclear stress test.     Signed, Ermalinda Barrios, PA-C  05/30/2015 11:20 AM    [facility] [address], [place], [place]  [address] Phone: [phone]; Fax: [phone]

## 2015-06-02 ENCOUNTER — Other Ambulatory Visit: Payer: Self-pay | Admitting: Nurse Practitioner

## 2015-06-02 ENCOUNTER — Ambulatory Visit
Admission: RE | Admit: 2015-06-02 | Discharge: 2015-06-02 | Disposition: A | Discharge status: Home / Self Care | Payer: PPO | Source: Ambulatory Visit | Attending: Nurse Practitioner | Admitting: Nurse Practitioner

## 2015-06-02 ENCOUNTER — Ambulatory Visit
Admission: RE | Admit: 2015-06-02 | Discharge: 2015-06-02 | Disposition: A | Discharge status: Home / Self Care | Payer: PPO | Source: Ambulatory Visit | Attending: Neurosurgery | Admitting: Neurosurgery

## 2015-06-02 DIAGNOSIS — M4322 Fusion of spine, cervical region: Secondary | ICD-10-CM | POA: Diagnosis not present

## 2015-06-02 DIAGNOSIS — M5126 Other intervertebral disc displacement, lumbar region: Secondary | ICD-10-CM

## 2015-06-02 DIAGNOSIS — M542 Cervicalgia: Secondary | ICD-10-CM

## 2015-06-02 DIAGNOSIS — M5127 Other intervertebral disc displacement, lumbosacral region: Secondary | ICD-10-CM | POA: Diagnosis not present

## 2015-06-02 MED ORDER — IOHEXOL 180 MG/ML  SOLN
1.0000 mL | Freq: Once | INTRAMUSCULAR | Status: AC | PRN
  Administered 2015-06-02: 1 mL via EPIDURAL

## 2015-06-02 MED ORDER — METHYLPREDNISOLONE ACETATE 40 MG/ML INJ SUSP (RADIOLOG
120.0000 mg | Freq: Once | INTRAMUSCULAR | Status: AC
  Administered 2015-06-02: 120 mg via EPIDURAL

## 2015-06-03 ENCOUNTER — Ambulatory Visit
Admission: RE | Admit: 2015-06-03 | Discharge: 2015-06-03 | Disposition: A | Discharge status: Home / Self Care | Payer: PPO | Source: Ambulatory Visit

## 2015-06-03 DIAGNOSIS — Z1231 Encounter for screening mammogram for malignant neoplasm of breast: Secondary | ICD-10-CM | POA: Diagnosis not present

## 2015-06-13 ENCOUNTER — Ambulatory Visit: Payer: PPO | Admitting: Physician Assistant

## 2015-06-22 DIAGNOSIS — M5126 Other intervertebral disc displacement, lumbar region: Secondary | ICD-10-CM | POA: Diagnosis not present

## 2015-06-24 ENCOUNTER — Telehealth: Payer: Self-pay | Admitting: *Deleted

## 2015-06-24 ENCOUNTER — Encounter: Payer: Self-pay | Admitting: Gynecology

## 2015-06-24 ENCOUNTER — Ambulatory Visit (INDEPENDENT_AMBULATORY_CARE_PROVIDER_SITE_OTHER): Payer: PPO | Admitting: Gynecology

## 2015-06-24 VITALS — BP 124/80 | Ht 65.0 in | Wt 156.0 lb

## 2015-06-24 DIAGNOSIS — N952 Postmenopausal atrophic vaginitis: Secondary | ICD-10-CM

## 2015-06-24 DIAGNOSIS — M8580 Other specified disorders of bone density and structure, unspecified site: Secondary | ICD-10-CM

## 2015-06-24 DIAGNOSIS — Z79890 Hormone replacement therapy (postmenopausal): Secondary | ICD-10-CM | POA: Diagnosis not present

## 2015-06-24 DIAGNOSIS — Z01419 Encounter for gynecological examination (general) (routine) without abnormal findings: Secondary | ICD-10-CM | POA: Diagnosis not present

## 2015-06-24 MED ORDER — ESTRADIOL 1 MG PO TABS: 0.5000 mg | ORAL_TABLET | Freq: Every day | ORAL | Status: DC

## 2015-06-24 NOTE — Telephone Encounter (Signed)
PA done online for estradiol 1 mg will wait for response.

## 2015-06-24 NOTE — Telephone Encounter (Signed)
Medication approved from 06/24/15-03/18/16

## 2015-06-24 NOTE — Progress Notes (Signed)
    Amanda [name] 07-19-1945 [id number]        70 y.o.  [id number]  for breast and pelvic exam. Several issues noted below.  Past medical history,surgical history, problem list, medications, allergies, family history and social history were all reviewed and documented as reviewed in the EPIC chart.  ROS:  Performed with pertinent positives and negatives included in the history, assessment and plan.   Additional significant findings :  none   Exam: Caryn Bee assistant Filed Vitals:   06/24/15 1144  BP: 124/80  Height: 5\' 5"  ([id number] m)  Weight: 156 lb (70.761 kg)   General appearance:  Normal affect, orientation and appearance. Skin: Grossly normal HEENT: Without gross lesions.  No cervical or supraclavicular adenopathy. Thyroid normal.  Lungs:  Clear without wheezing, rales or rhonchi Cardiac: RR, without RMG Abdominal:  Soft, nontender, without masses, guarding, rebound, organomegaly or hernia Breasts:  Examined lying and sitting without masses, retractions, discharge or axillary adenopathy. Pelvic:  Ext/BUS/vagina with atrophic changes  Adnexa without masses or tenderness    Anus and perineum normal   Rectovaginal normal sphincter tone without palpated masses or tenderness.    Assessment/Plan:  70 y.o. [id number] female for breast and pelvic exam.   1. Postmenopausal/atrophic genital changes/HRT. Status post TVH for leiomyoma/bleeding with subsequent BSO for hydrosalpinx. Continues on estradiol 0.5 mg daily. Has tried to wean but had unacceptable headaches and hot flashes. I again reviewed the whole issue of HRT, lowest dose per shortest period of time and risks to include increased risk of stroke heart attack DVT possible breast cancer. Patient understands, accepts and wants to continue. Refill 1 year provided. 2. Osteopenia. DEXA 2012 T score -1.9 FRAX a percent/0.7%. Is in the process of arranging DEXA through our orthopedic surgeon. I recommended that she schedule here as this  is where she had her last bone density for direct comparison. Patient agrees to do so will schedule her bone density. 3. Colonoscopy due now at 5 year interval per her history. She is going to call [facility] to arrange. Names and numbers provided. 4. Pap smear 2012. No Pap smear done today. No history of abnormal Pap smears previously. We both agree to stop screening based on age and hysterectomy history per current screening guidelines. 5. Mammography 05/2015. Continue with annual mammography when due. SBE monthly reviewed. 6. Health maintenance. No routine lab work done as patient reports this done at her primary physician's office. Follow up 1 year, sooner as needed.   Anastasio Auerbach MD, 12:08 PM 06/24/2015

## 2015-06-24 NOTE — Patient Instructions (Signed)
Schedule your colonoscopy with :  [facility]   Address: [address], [place], [place] [address]  [phone]   Follow up for bone density as scheduled.  Menopause is a normal process in which your reproductive ability comes to an end. This process happens gradually over a span of months to years, usually between the ages of 59 and 61. Menopause is complete when you have missed 12 consecutive menstrual periods. It is important to talk with your health care provider about some of the most common conditions that affect postmenopausal women, such as heart disease, cancer, and bone loss (osteoporosis). Adopting a healthy lifestyle and getting preventive care can help to promote your health and wellness. Those actions can also lower your chances of developing some of these common conditions. WHAT SHOULD I KNOW ABOUT MENOPAUSE? During menopause, you may experience a number of symptoms, such as:  Moderate-to-severe hot flashes.  Night sweats.  Decrease in sex drive.  Mood swings.  Headaches.  Tiredness.  Irritability.  Memory problems.  Insomnia. Choosing to treat or not to treat menopausal changes is an individual decision that you make with your health care provider. WHAT SHOULD I KNOW ABOUT HORMONE REPLACEMENT THERAPY AND SUPPLEMENTS? Hormone therapy products are effective for treating symptoms that are associated with menopause, such as hot flashes and night sweats. Hormone replacement carries certain risks, especially as you become older. If you are thinking about using estrogen or estrogen with progestin treatments, discuss the benefits and risks with your health care provider. WHAT SHOULD I KNOW ABOUT HEART DISEASE AND STROKE? Heart disease, heart attack, and stroke become more likely as you age. This may be due, in part, to the hormonal changes that your body experiences during menopause. These can affect how your body processes dietary fats, triglycerides, and  cholesterol. Heart attack and stroke are both medical emergencies. There are many things that you can do to help prevent heart disease and stroke:  Have your blood pressure checked at least every 1-2 years. High blood pressure causes heart disease and increases the risk of stroke.  If you are 9-77 years old, ask your health care provider if you should take aspirin to prevent a heart attack or a stroke.  Do not use any tobacco products, including cigarettes, chewing tobacco, or electronic cigarettes. If you need help quitting, ask your health care provider.  It is important to eat a healthy diet and maintain a healthy weight.  Be sure to include plenty of vegetables, fruits, low-fat dairy products, and lean protein.  Avoid eating foods that are high in solid fats, added sugars, or salt (sodium).  Get regular exercise. This is one of the most important things that you can do for your health.  Try to exercise for at least 150 minutes each week. The type of exercise that you do should increase your heart rate and make you sweat. This is known as moderate-intensity exercise.  Try to do strengthening exercises at least twice each week. Do these in addition to the moderate-intensity exercise.  Know your numbers.Ask your health care provider to check your cholesterol and your blood glucose. Continue to have your blood tested as directed by your health care provider. WHAT SHOULD I KNOW ABOUT CANCER SCREENING? There are several types of cancer. Take the following steps to reduce your risk and to catch any cancer development as early as possible. Breast Cancer  Practice breast self-awareness.  This means understanding how your breasts normally appear and feel.  It also means doing regular breast self-exams. Let your health care provider know about any changes, no matter how small.  If you are 21 or older, have a clinician do a breast exam (clinical breast exam or CBE) every year. Depending on  your age, family history, and medical history, it may be recommended that you also have a yearly breast X-ray (mammogram).  If you have a family history of breast cancer, talk with your health care provider about genetic screening.  If you are at high risk for breast cancer, talk with your health care provider about having an MRI and a mammogram every year.  Breast cancer (BRCA) gene test is recommended for women who have family members with BRCA-related cancers. Results of the assessment will determine the need for genetic counseling and BRCA1 and for BRCA2 testing. BRCA-related cancers include these types:  Breast. This occurs in males or females.  Ovarian.  Tubal. This may also be called fallopian tube cancer.  Cancer of the abdominal or pelvic lining (peritoneal cancer).  Prostate.  Pancreatic. Cervical, Uterine, and Ovarian Cancer Your health care provider may recommend that you be screened regularly for cancer of the pelvic organs. These include your ovaries, uterus, and vagina. This screening involves a pelvic exam, which includes checking for microscopic changes to the surface of your cervix (Pap test).  For women ages 21-65, health care providers may recommend a pelvic exam and a Pap test every three years. For women ages 40-65, they may recommend the Pap test and pelvic exam, combined with testing for human papilloma virus (HPV), every five years. Some types of HPV increase your risk of cervical cancer. Testing for HPV may also be done on women of any age who have unclear Pap test results.  Other health care providers may not recommend any screening for nonpregnant women who are considered low risk for pelvic cancer and have no symptoms. Ask your health care provider if a screening pelvic exam is right for you.  If you have had past treatment for cervical cancer or a condition that could lead to cancer, you need Pap tests and screening for cancer for at least 20 years after your  treatment. If Pap tests have been discontinued for you, your risk factors (such as having a new sexual partner) need to be reassessed to determine if you should start having screenings again. Some women have medical problems that increase the chance of getting cervical cancer. In these cases, your health care provider may recommend that you have screening and Pap tests more often.  If you have a family history of uterine cancer or ovarian cancer, talk with your health care provider about genetic screening.  If you have vaginal bleeding after reaching menopause, tell your health care provider.  There are currently no reliable tests available to screen for ovarian cancer. Lung Cancer Lung cancer screening is recommended for adults 93-51 years old who are at high risk for lung cancer because of a history of smoking. A yearly low-dose CT scan of the lungs is recommended if you:  Currently smoke.  Have a history of at least 30 pack-years of smoking and you currently smoke or have quit within the past 15 years. A pack-year is smoking an average of one pack of cigarettes per day for one year. Yearly screening should:  Continue until it has been 15 years since you quit.  Stop if you develop a health problem that would prevent you from having lung cancer treatment. Colorectal Cancer  This type of cancer can be detected and can often be prevented.  Routine colorectal cancer screening usually begins at age 52 and continues through age 71.  If you have risk factors for colon cancer, your health care provider may recommend that you be screened at an earlier age.  If you have a family history of colorectal cancer, talk with your health care provider about genetic screening.  Your health care provider may also recommend using home test kits to check for hidden blood in your stool.  A small camera at the end of a tube can be used to examine your colon directly (sigmoidoscopy or colonoscopy). This is  done to check for the earliest forms of colorectal cancer.  Direct examination of the colon should be repeated every 5-10 years until age 44. However, if early forms of precancerous polyps or small growths are found or if you have a family history or genetic risk for colorectal cancer, you may need to be screened more often. Skin Cancer  Check your skin from head to toe regularly.  Monitor any moles. Be sure to tell your health care provider:  About any new moles or changes in moles, especially if there is a change in a mole's shape or color.  If you have a mole that is larger than the size of a pencil eraser.  If any of your family members has a history of skin cancer, especially at a young age, talk with your health care provider about genetic screening.  Always use sunscreen. Apply sunscreen liberally and repeatedly throughout the day.  Whenever you are outside, protect yourself by wearing long sleeves, pants, a wide-brimmed hat, and sunglasses. WHAT SHOULD I KNOW ABOUT OSTEOPOROSIS? Osteoporosis is a condition in which bone destruction happens more quickly than new bone creation. After menopause, you may be at an increased risk for osteoporosis. To help prevent osteoporosis or the bone fractures that can happen because of osteoporosis, the following is recommended:  If you are 54-27 years old, get at least 1,000 mg of calcium and at least 600 mg of vitamin D per day.  If you are older than age 20 but younger than age 30, get at least 1,200 mg of calcium and at least 600 mg of vitamin D per day.  If you are older than age 23, get at least 1,200 mg of calcium and at least 800 mg of vitamin D per day. Smoking and excessive alcohol intake increase the risk of osteoporosis. Eat foods that are rich in calcium and vitamin D, and do weight-bearing exercises several times each week as directed by your health care provider. WHAT SHOULD I KNOW ABOUT HOW MENOPAUSE AFFECTS [place]? Depression may occur at any age, but it is more common as you become older. Common symptoms of depression include:  Low or sad mood.  Changes in sleep patterns.  Changes in appetite or eating patterns.  Feeling an overall lack of motivation or enjoyment of activities that you previously enjoyed.  Frequent crying spells. Talk with your health care provider if you think that you are experiencing depression. WHAT SHOULD I KNOW ABOUT IMMUNIZATIONS? It is important that you get and maintain your immunizations. These include:  Tetanus, diphtheria, and pertussis (Tdap) booster vaccine.  Influenza every year before the flu season begins.  Pneumonia vaccine.  Shingles vaccine. Your health care provider may also recommend other immunizations.   This information is not intended to replace advice given to you by your health care  provider. Make sure you discuss any questions you have with your health care provider.   Document Released: 04/27/2005 Document Revised: 03/26/2014 Document Reviewed: 11/05/2013 Elsevier Interactive Patient Education 2016 [employer]  

## 2015-07-02 NOTE — Progress Notes (Signed)
HPI: FU dyspnea. See previous notes for details. Stress echocardiogram October 2015 showed chest pain, electocardiographic changes and possible anteroapical wall motion abnormality. Cardiac catheterization November 2015 showed a 70% small nondominant right coronary artery and an ejection fraction of 55-65%. Medical therapy recommended. Seen in March for CP felt to be atypical. Since last seen, She has some dyspnea on exertion but no orthopnea, PND, pedal edema, palpitations, syncope or exertional chest pain. She has occasions where she feels "smothering". This is transient and not associated with chest pain. It improves with Xanax.  Current Outpatient Prescriptions  Medication Sig Dispense Refill  . ALPRAZolam (XANAX) 0.5 MG tablet Take 0.5-1 tablets (0.25-0.5 mg total) by mouth 4 (four) times daily as needed for anxiety. 20 tablet 0  . amLODipine (NORVASC) 5 MG tablet Take 5 mg by mouth daily.     Marland Kitchen aspirin EC 81 MG tablet Take 1 tablet (81 mg total) by mouth daily. 90 tablet 3  . atenolol (TENORMIN) 25 MG tablet Take 25 mg by mouth daily.     . Cholecalciferol (VITAMIN D PO) Take by mouth.    . estradiol (ESTRACE) 1 MG tablet Take 0.5 tablets (0.5 mg total) by mouth daily. 90 tablet 3  . HYDROcodone-acetaminophen (NORCO) 10-325 MG tablet Take 1-2 tablets by mouth every 6 (six) hours as needed.  0  . omeprazole (PRILOSEC) 40 MG capsule Take 1 capsule daily (Patient taking differently: Take 40 mg by mouth daily. ) 90 capsule 3  . VOLTAREN 1 % GEL Apply 2 g topically daily as needed (pain).      No current facility-administered medications for this visit.     Past Medical History  Diagnosis Date  . Stricture of esophagus   . Hiatal hernia   . GERD (gastroesophageal reflux disease)   . Diverticulosis   . Hemorrhoids   . Osteopenia   . Vitamin D deficiency   . Arthritis   . Hypertension   . Back pain   . Melanoma ([place])   . Hyperlipidemia   . Pituitary tumor [facility])     Past  Surgical History  Procedure Laterality Date  . Back surgery      x 2  . Vaginal hysterectomy  1993  . Excision of skin cancer      Melanoma  . Hemorrhoid banding      X3  . Tubal ligation    . Pelvic laparoscopy  2012    Diag Lap-BSO-lysis of adhesions  . Oophorectomy  2012    BSO  . Foot surgery    . Left heart catheterization with coronary angiogram N/A 02/03/2014    Procedure: LEFT HEART CATHETERIZATION WITH CORONARY ANGIOGRAM;  Surgeon: [name], MD;  Location: [facility] CATH LAB;  Service: Cardiovascular;  Laterality: N/A;    Social History   Social History  . Marital Status: Married    Spouse Name: N/A  . Number of Children: 3  . Years of Education: N/A   Occupational History  . Disabled    Social History Main Topics  . Smoking status: Former Smoker    Quit date: 03/19/1994  . Smokeless tobacco: Not on file  . Alcohol Use: No  . Drug Use: No  . Sexual Activity: Not Currently    Birth Control/ Protection: Surgical     Comment: 1st intercourse 70 yo-Fewer than 5 partners   Other Topics Concern  . Not on file   Social History Narrative    Family History  Problem Relation Age of Onset  . Heart disease Mother   . Hypertension Mother   . Stroke Mother   . Colon polyps Father   . Heart disease Father     CHF  . Hypertension Father   . Cancer Father     COLON  . Colon cancer Sister   . Colon polyps Sister   . Heart disease Sister   . Diabetes Sister   . Melanoma Sister   . Colon polyps Brother   . Diabetes Brother   . Heart disease Brother   . Cancer Brother     COLON  . Melanoma Brother   . Melanoma Brother     ROS: no fevers or chills, productive cough, hemoptysis, dysphasia, odynophagia, melena, hematochezia, dysuria, hematuria, rash, seizure activity, orthopnea, PND, pedal edema, claudication. Remaining systems are negative.  Physical Exam: Well-developed well-nourished in no acute distress.  Skin is warm and dry.  HEENT is normal.  Neck  is supple.  Chest is clear to auscultation with normal expansion.  Cardiovascular exam is regular rate and rhythm.  Abdominal exam nontender or distended. No masses palpated. Extremities show no edema. neuro grossly intact  ECG 05/30/2015-sinus rhythm, nonspecific ST changes.

## 2015-07-04 ENCOUNTER — Encounter: Payer: Self-pay | Admitting: Cardiology

## 2015-07-04 ENCOUNTER — Telehealth: Payer: Self-pay | Admitting: Cardiology

## 2015-07-04 ENCOUNTER — Ambulatory Visit (INDEPENDENT_AMBULATORY_CARE_PROVIDER_SITE_OTHER): Payer: PPO | Admitting: Cardiology

## 2015-07-04 VITALS — BP 150/86 | HR 70 | Ht 65.0 in | Wt 160.0 lb

## 2015-07-04 DIAGNOSIS — E785 Hyperlipidemia, unspecified: Secondary | ICD-10-CM | POA: Diagnosis not present

## 2015-07-04 MED ORDER — PRAVASTATIN SODIUM 40 MG PO TABS: 40.0000 mg | ORAL_TABLET | Freq: Every evening | ORAL | Status: DC

## 2015-07-04 MED ORDER — AMLODIPINE BESYLATE 10 MG PO TABS: 10.0000 mg | ORAL_TABLET | Freq: Every day | ORAL | Status: DC

## 2015-07-04 NOTE — Telephone Encounter (Signed)
FORWARD TO DEBRA 

## 2015-07-04 NOTE — Assessment & Plan Note (Signed)
Continue aspirin.Add Pravachol 40 mg daily. Check lipids and liver in 4 weeks.

## 2015-07-04 NOTE — Telephone Encounter (Signed)
Follow Up  ° °Pt returned the call  °

## 2015-07-04 NOTE — Patient Instructions (Signed)
Medication Instructions:   INCREASE AMLODIPINE TO 10 MG ONCE DAILY= 2 OF THE 5 MG TABLETS ONCE DAILY  START PRAVASTATIN 40 MG ONCE DAILY  Labwork:  Your physician recommends that you return for lab work in: [place] TO LAB WORK  Follow-Up:  Your physician wants you to follow-up in: [place] will receive a reminder letter in the mail two months in advance. If you don't receive a letter, please call our office to schedule the follow-up appointment.   If you need a refill on your cardiac medications before your next appointment, please call your pharmacy.

## 2015-07-04 NOTE — Assessment & Plan Note (Signed)
Blood pressure elevated. Increase amlodipine to 10 mg daily and follow. 

## 2015-07-04 NOTE — Telephone Encounter (Signed)
Left message for pt to call.

## 2015-07-05 MED ORDER — AMLODIPINE BESYLATE 10 MG PO TABS: 10.0000 mg | ORAL_TABLET | Freq: Every day | ORAL | Status: DC

## 2015-07-05 MED ORDER — PRAVASTATIN SODIUM 40 MG PO TABS: 40.0000 mg | ORAL_TABLET | Freq: Every evening | ORAL | Status: DC

## 2015-07-05 NOTE — Telephone Encounter (Signed)
Follow up   Pt is calling for rn   Returning her call

## 2015-07-05 NOTE — Telephone Encounter (Signed)
Spoke with pt, mail order pharmacy added to chart and refills sent electronically.

## 2015-07-13 ENCOUNTER — Other Ambulatory Visit: Payer: Self-pay | Admitting: Neurosurgery

## 2015-07-13 DIAGNOSIS — M810 Age-related osteoporosis without current pathological fracture: Secondary | ICD-10-CM

## 2015-07-18 DIAGNOSIS — M8580 Other specified disorders of bone density and structure, unspecified site: Secondary | ICD-10-CM

## 2015-07-18 HISTORY — DX: Other specified disorders of bone density and structure, unspecified site: M85.80

## 2015-07-25 ENCOUNTER — Other Ambulatory Visit: Payer: Self-pay | Admitting: Nurse Practitioner

## 2015-07-25 ENCOUNTER — Ambulatory Visit
Admission: RE | Admit: 2015-07-25 | Discharge: 2015-07-25 | Disposition: A | Discharge status: Home / Self Care | Payer: PPO | Source: Ambulatory Visit | Attending: Nurse Practitioner | Admitting: Nurse Practitioner

## 2015-07-25 DIAGNOSIS — M47816 Spondylosis without myelopathy or radiculopathy, lumbar region: Secondary | ICD-10-CM | POA: Diagnosis not present

## 2015-07-25 DIAGNOSIS — M5126 Other intervertebral disc displacement, lumbar region: Secondary | ICD-10-CM

## 2015-07-28 DIAGNOSIS — M542 Cervicalgia: Secondary | ICD-10-CM | POA: Diagnosis not present

## 2015-07-28 DIAGNOSIS — M5126 Other intervertebral disc displacement, lumbar region: Secondary | ICD-10-CM | POA: Diagnosis not present

## 2015-08-02 ENCOUNTER — Encounter: Payer: Self-pay | Admitting: Gynecology

## 2015-08-02 ENCOUNTER — Other Ambulatory Visit: Payer: Self-pay | Admitting: Gynecology

## 2015-08-02 ENCOUNTER — Ambulatory Visit (INDEPENDENT_AMBULATORY_CARE_PROVIDER_SITE_OTHER): Payer: PPO

## 2015-08-02 DIAGNOSIS — M8580 Other specified disorders of bone density and structure, unspecified site: Secondary | ICD-10-CM

## 2015-08-02 DIAGNOSIS — M899 Disorder of bone, unspecified: Secondary | ICD-10-CM

## 2015-08-25 ENCOUNTER — Encounter: Payer: Self-pay | Admitting: *Deleted

## 2015-08-25 DIAGNOSIS — M810 Age-related osteoporosis without current pathological fracture: Secondary | ICD-10-CM | POA: Diagnosis not present

## 2015-08-25 DIAGNOSIS — M542 Cervicalgia: Secondary | ICD-10-CM | POA: Diagnosis not present

## 2015-08-29 DIAGNOSIS — M810 Age-related osteoporosis without current pathological fracture: Secondary | ICD-10-CM | POA: Insufficient documentation

## 2015-08-29 DIAGNOSIS — R600 Localized edema: Secondary | ICD-10-CM | POA: Diagnosis not present

## 2015-08-29 DIAGNOSIS — F419 Anxiety disorder, unspecified: Secondary | ICD-10-CM

## 2015-08-29 HISTORY — DX: Hypocalcemia: E83.51

## 2015-08-29 HISTORY — DX: Age-related osteoporosis without current pathological fracture: M81.0

## 2015-08-29 HISTORY — DX: Anxiety disorder, unspecified: F41.9

## 2015-09-06 ENCOUNTER — Encounter: Payer: Self-pay | Admitting: Gastroenterology

## 2015-09-15 ENCOUNTER — Telehealth: Payer: Self-pay | Admitting: Cardiology

## 2015-09-15 NOTE — Telephone Encounter (Signed)
Returned call. Spoke to patient. She notes she had taken the pravachol for about 2 weeks when prescribed mid-April.  Had SEs of thighs aching, which resolved after discontinuation of med.  Calling now to let us know. I discussed typical options, med therapy alternatives w her.  Pt notes that she has an appt w PCP in 2 weeks, typically checks labs including cholesterol at that office. She wants to wait on further recommendations until after that appt. I asked patient to call us and/or get that labwork sent to our office for Dr. Jacalyn Lefevre review. Pt voiced acknowledgment and understanding. She is aware to call if new concerns or if she decides to pursue alternative statin.

## 2015-09-15 NOTE — Telephone Encounter (Signed)
New MEssage  Pt received letter about blood work she needed after starting new medication- stated that after taking her new med for 2 weeks , her legs started hurting and she stopped. Pt wanted to f/u w/ RN.Please call back and discuss.

## 2015-10-11 ENCOUNTER — Other Ambulatory Visit: Payer: Self-pay | Admitting: Neurosurgery

## 2015-10-11 DIAGNOSIS — M5126 Other intervertebral disc displacement, lumbar region: Secondary | ICD-10-CM

## 2015-10-19 DIAGNOSIS — S9031XA Contusion of right foot, initial encounter: Secondary | ICD-10-CM | POA: Diagnosis not present

## 2015-10-19 DIAGNOSIS — S90111A Contusion of right great toe without damage to nail, initial encounter: Secondary | ICD-10-CM | POA: Diagnosis not present

## 2015-10-19 DIAGNOSIS — M79671 Pain in right foot: Secondary | ICD-10-CM | POA: Diagnosis not present

## 2015-10-19 DIAGNOSIS — M7989 Other specified soft tissue disorders: Secondary | ICD-10-CM | POA: Diagnosis not present

## 2015-10-20 ENCOUNTER — Encounter: Payer: Self-pay | Admitting: Gastroenterology

## 2015-10-22 ENCOUNTER — Ambulatory Visit
Admission: RE | Admit: 2015-10-22 | Discharge: 2015-10-22 | Disposition: A | Discharge status: Home / Self Care | Payer: PPO | Source: Ambulatory Visit | Attending: Neurosurgery | Admitting: Neurosurgery

## 2015-10-22 DIAGNOSIS — M5126 Other intervertebral disc displacement, lumbar region: Secondary | ICD-10-CM | POA: Diagnosis not present

## 2015-11-02 DIAGNOSIS — S9031XA Contusion of right foot, initial encounter: Secondary | ICD-10-CM | POA: Diagnosis not present

## 2015-11-02 DIAGNOSIS — S92424A Nondisplaced fracture of distal phalanx of right great toe, initial encounter for closed fracture: Secondary | ICD-10-CM | POA: Diagnosis not present

## 2015-11-02 DIAGNOSIS — S90121A Contusion of right lesser toe(s) without damage to nail, initial encounter: Secondary | ICD-10-CM | POA: Diagnosis not present

## 2015-11-22 ENCOUNTER — Ambulatory Visit (AMBULATORY_SURGERY_CENTER): Payer: Self-pay | Admitting: *Deleted

## 2015-11-22 VITALS — Ht 65.0 in | Wt 160.0 lb

## 2015-11-22 DIAGNOSIS — Z800 Family history of malignant neoplasm of digestive organs: Secondary | ICD-10-CM

## 2015-11-22 MED ORDER — NA SULFATE-K SULFATE-MG SULF 17.5-3.13-1.6 GM/177ML PO SOLN: 1.0000 | Freq: Once | ORAL | 0 refills | Status: AC

## 2015-11-22 NOTE — Progress Notes (Signed)
No egg or soy allergy known to patient  No issues with past sedation with any surgeries  or procedures, no intubation problems  No diet pills per patient No home 02 use per patient  No blood thinners per patient  Pt states  issues with constipation - if she drinks super dieters tea she doesn't have issues - not chronic daily issue  No A fib or A flutter

## 2015-11-28 DIAGNOSIS — L03032 Cellulitis of left toe: Secondary | ICD-10-CM | POA: Diagnosis not present

## 2015-11-28 DIAGNOSIS — S92424D Nondisplaced fracture of distal phalanx of right great toe, subsequent encounter for fracture with routine healing: Secondary | ICD-10-CM | POA: Diagnosis not present

## 2015-11-29 ENCOUNTER — Telehealth: Payer: Self-pay | Admitting: Gastroenterology

## 2015-11-29 NOTE — Telephone Encounter (Signed)
Patient put on list for Suprep sample.  I will call patient closer to procedure.

## 2015-11-29 NOTE — Telephone Encounter (Signed)
Phone call to patient. Her copay for prep was $85.00 which she states she cannot afford. Procedure is 12/21/15. Informed her that I would forward the message to [place] who will put her on a list for a sample and that Magda Paganini will call her closer to her procedure date when a sample is available. Pt aware that she will need to come and pick the sample up.

## 2015-11-30 DIAGNOSIS — M79645 Pain in left finger(s): Secondary | ICD-10-CM | POA: Diagnosis not present

## 2015-11-30 DIAGNOSIS — M180 Bilateral primary osteoarthritis of first carpometacarpal joints: Secondary | ICD-10-CM | POA: Diagnosis not present

## 2015-11-30 DIAGNOSIS — M79644 Pain in right finger(s): Secondary | ICD-10-CM | POA: Diagnosis not present

## 2015-12-05 DIAGNOSIS — M5126 Other intervertebral disc displacement, lumbar region: Secondary | ICD-10-CM | POA: Diagnosis not present

## 2015-12-05 DIAGNOSIS — M180 Bilateral primary osteoarthritis of first carpometacarpal joints: Secondary | ICD-10-CM | POA: Diagnosis not present

## 2015-12-14 ENCOUNTER — Encounter: Payer: Self-pay | Admitting: Gastroenterology

## 2015-12-21 ENCOUNTER — Encounter: Payer: PPO | Admitting: Gastroenterology

## 2016-01-24 DIAGNOSIS — K5900 Constipation, unspecified: Secondary | ICD-10-CM | POA: Diagnosis not present

## 2016-01-24 DIAGNOSIS — R001 Bradycardia, unspecified: Secondary | ICD-10-CM | POA: Diagnosis not present

## 2016-01-24 DIAGNOSIS — R1010 Upper abdominal pain, unspecified: Secondary | ICD-10-CM | POA: Diagnosis not present

## 2016-01-24 DIAGNOSIS — R1013 Epigastric pain: Secondary | ICD-10-CM | POA: Diagnosis not present

## 2016-01-31 ENCOUNTER — Ambulatory Visit: Payer: PPO | Admitting: Gastroenterology

## 2016-01-31 ENCOUNTER — Ambulatory Visit (INDEPENDENT_AMBULATORY_CARE_PROVIDER_SITE_OTHER): Payer: PPO | Admitting: Gastroenterology

## 2016-01-31 ENCOUNTER — Encounter: Payer: Self-pay | Admitting: Gastroenterology

## 2016-01-31 ENCOUNTER — Encounter (INDEPENDENT_AMBULATORY_CARE_PROVIDER_SITE_OTHER): Payer: Self-pay

## 2016-01-31 VITALS — BP 122/68 | HR 64 | Ht 65.0 in | Wt 166.4 lb

## 2016-01-31 DIAGNOSIS — Z800 Family history of malignant neoplasm of digestive organs: Secondary | ICD-10-CM | POA: Diagnosis not present

## 2016-01-31 DIAGNOSIS — K219 Gastro-esophageal reflux disease without esophagitis: Secondary | ICD-10-CM | POA: Diagnosis not present

## 2016-01-31 DIAGNOSIS — R140 Abdominal distension (gaseous): Secondary | ICD-10-CM | POA: Diagnosis not present

## 2016-01-31 MED ORDER — OMEPRAZOLE 40 MG PO CPDR: DELAYED_RELEASE_CAPSULE | ORAL | 3 refills | Status: DC

## 2016-01-31 NOTE — Patient Instructions (Signed)
If you are age 68 or older, your body mass index should be between 23-30. Your Body mass index is 27.69 kg/m. If this is out of the aforementioned range listed, please consider follow up with your Primary Care Provider.  If you are age 37 or younger, your body mass index should be between 19-25. Your Body mass index is 27.69 kg/m. If this is out of the aformentioned range listed, please consider follow up with your Primary Care Provider.   You have been scheduled for a colonoscopy. Please follow written instructions given to you at your visit today.  Please pick up your prep supplies at the pharmacy within the next 1-3 days.  If you use inhalers (even only as needed), please bring them with you on the day of your procedure.  Thank you for choosing [facility]  Dr Wilfrid Lund III

## 2016-01-31 NOTE — Progress Notes (Signed)
[facility] Consult Note:  History: DAYLI PRIESTER 01/31/2016  Referring physician: [name], MD  Reason for consult/chief complaint: Abdominal Pain (feels full, takes patient breath away); Colonoscopy; Endoscopy; and Bloated   Subjective  HPI:  This is a 70 year old woman referred to see [place] for abdominal bloating and fullness. She has always tended toward constipation, but it's been somewhat worse in the last 9 months. She will have episodes of bloating where she feels completely full and has just retch relay back to get some relief. This has been going on for about 6 months, and has no clear or consistent triggers. She has required chronic opioids for back pain over the last several years. She denies rectal bleeding, and is Some relief from constipation using a Senokot containing tea.  Her last colonoscopy in July 2012 by Dr. Deatra Ina revealed diverticulosis and external hemorrhoids. The patient was to see me for a follow-up screening colonoscopy due to her family history of a sister with colon cancer, but had to cancel the appointment in September because her husband was sick. She denies early satiety, dysphagia vomiting or weight loss  ROS:  Review of Systems   Past Medical History: Past Medical History:  Diagnosis Date  . Arthritis   . Back pain   . Cataract    bilat removed  . Diverticulosis   . GERD (gastroesophageal reflux disease)   . Hemorrhoids   . Hiatal hernia   . Hyperlipidemia   . Hypertension   . Melanoma ([place])   . Osteopenia 07/2015   T score -2.0 FRAX 10%/1.4%  . Pituitary tumor   . Stricture of esophagus   . Vitamin D deficiency      Past Surgical History: Past Surgical History:  Procedure Laterality Date  . BACK SURGERY     x 2  . COLONOSCOPY    . EXCISION OF SKIN CANCER     Melanoma  . FOOT SURGERY    . HEMORRHOID BANDING     X3  . LEFT HEART CATHETERIZATION WITH CORONARY ANGIOGRAM N/A 02/03/2014   Procedure: LEFT HEART  CATHETERIZATION WITH CORONARY ANGIOGRAM;  Surgeon: [name], MD;  Location: [facility] CATH LAB;  Service: Cardiovascular;  Laterality: N/A;  . OOPHORECTOMY  2012   BSO  . PELVIC LAPAROSCOPY  2012   Diag Lap-BSO-lysis of adhesions  . TUBAL LIGATION    . VAGINAL HYSTERECTOMY  1993     Family History: Family History  Problem Relation Age of Onset  . Heart disease Mother   . Hypertension Mother   . Stroke Mother   . Colon polyps Father   . Heart disease Father     CHF  . Hypertension Father   . Cancer Father     COLON  . Colon cancer Father   . Colon cancer Sister   . Colon polyps Sister   . Heart disease Sister   . Diabetes Sister   . Melanoma Sister   . Colon polyps Brother   . Diabetes Brother   . Heart disease Brother   . Cancer Brother     COLON  . Melanoma Brother   . Colon cancer Brother   . Melanoma Brother   . Esophageal cancer Neg Hx   . Rectal cancer Neg Hx   . Stomach cancer Neg Hx   Correction to above;  Sister with colon cancer   Father's History uncertain Social History: Social History   Social History  . Marital status: Married    Spouse  name: N/A  . Number of children: 3  . Years of education: N/A   Occupational History  . Disabled    Social History Main Topics  . Smoking status: Former Smoker    Quit date: 03/19/1994  . Smokeless tobacco: Never Used  . Alcohol use No  . Drug use: No  . Sexual activity: Not Currently    Birth control/ protection: Surgical     Comment: 1st intercourse 70 yo-Fewer than 5 partners   Other Topics Concern  . None   Social History Narrative  . None    Allergies: Allergies  Allergen Reactions  . Lisinopril Swelling  . Pravastatin Other (See Comments)    Muscle aches     Outpatient Meds: Current Outpatient Prescriptions  Medication Sig Dispense Refill  . ALPRAZolam (XANAX) 0.5 MG tablet Take 0.5-1 tablets (0.25-0.5 mg total) by mouth 4 (four) times daily as needed for anxiety. 20 tablet 0  .  aspirin EC 81 MG tablet Take 1 tablet (81 mg total) by mouth daily. 90 tablet 3  . atenolol (TENORMIN) 25 MG tablet Take 25 mg by mouth daily.     . Cholecalciferol (VITAMIN D PO) Take by mouth.    . estradiol (ESTRACE) 1 MG tablet Take 0.5 tablets (0.5 mg total) by mouth daily. 90 tablet 3  . HYDROcodone-acetaminophen (NORCO) 10-325 MG tablet Take 1-2 tablets by mouth every 6 (six) hours as needed.  0  . omeprazole (PRILOSEC) 40 MG capsule Take 1 capsule daily 90 capsule 3  . UNABLE TO FIND Med Name: GAIA Herbs Dietary supplament    . VOLTAREN 1 % GEL Apply 2 g topically daily as needed (pain).      No current facility-administered medications for this visit.       ___________________________________________________________________ Objective   Exam:   BP 122/68   Pulse 64   Ht 5\' 5"  (1.651 m)   Wt 166 lb 6 oz (75.5 kg)   BMI 27.69 kg/m husband present for entire encounter  General: this is a(n) well-appearing older woman   Eyes: sclera anicteric, no redness  ENT: oral mucosa moist without lesions, no cervical or supraclavicular lymphadenopathy, good dentition  CV: RRR without murmur, S1/S2, no JVD, no peripheral edema  Resp: clear to auscultation bilaterally, normal RR and effort noted  GI: soft, no tenderness, with active bowel sounds. No guarding or palpable organomegaly noted.  Skin; warm and dry, no rash or jaundice noted  Neuro: awake, alert and oriented x 3. Normal gross motor function and fluent speech   Assessment: Encounter Diagnoses  Name Primary?  . Abdominal bloating Yes  . Gastroesophageal reflux disease, esophagitis presence not specified   . Family history of colon cancer    She appears to have opioid-induced constipation    Plan:  Colonoscopy for family history of colon cancer She will get a 2 day prep  Then we can discuss the use of Linzess, Amitiza or Movantik for her OIC.  Thank you for the courtesy of this consult.  Please call me  with any questions or concerns.  Nelida Meuse III  CC: [name], MD

## 2016-02-16 ENCOUNTER — Encounter: Payer: Self-pay | Admitting: Gastroenterology

## 2016-02-16 ENCOUNTER — Ambulatory Visit (AMBULATORY_SURGERY_CENTER): Payer: PPO | Admitting: Gastroenterology

## 2016-02-16 VITALS — BP 140/68 | HR 56 | Temp 97.7°F | Resp 13 | Ht 65.0 in | Wt 166.0 lb

## 2016-02-16 DIAGNOSIS — K219 Gastro-esophageal reflux disease without esophagitis: Secondary | ICD-10-CM | POA: Diagnosis not present

## 2016-02-16 DIAGNOSIS — Z800 Family history of malignant neoplasm of digestive organs: Secondary | ICD-10-CM | POA: Diagnosis not present

## 2016-02-16 DIAGNOSIS — I10 Essential (primary) hypertension: Secondary | ICD-10-CM | POA: Diagnosis not present

## 2016-02-16 MED ORDER — SODIUM CHLORIDE 0.9 % IV SOLN: 500.0000 mL | INTRAVENOUS | Status: DC

## 2016-02-16 MED ORDER — LINACLOTIDE 145 MCG PO CAPS: 145.0000 ug | ORAL_CAPSULE | Freq: Every day | ORAL | Status: DC

## 2016-02-16 NOTE — Patient Instructions (Signed)
YOU HAD AN ENDOSCOPIC PROCEDURE TODAY AT THE  ENDOSCOPY CENTER:   Refer to the procedure report that was given to you for any specific questions about what was found during the examination.  If the procedure report does not answer your questions, please call your gastroenterologist to clarify.  If you requested that your care partner not be given the details of your procedure findings, then the procedure report has been included in a sealed envelope for you to review at your convenience later.  YOU SHOULD EXPECT: Some feelings of bloating in the abdomen. Passage of more gas than usual.  Walking can help get rid of the air that was put into your GI tract during the procedure and reduce the bloating. If you had a lower endoscopy (such as a colonoscopy or flexible sigmoidoscopy) you may notice spotting of blood in your stool or on the toilet paper. If you underwent a bowel prep for your procedure, you may not have a normal bowel movement for a few days.  Please Note:  You might notice some irritation and congestion in your nose or some drainage.  This is from the oxygen used during your procedure.  There is no need for concern and it should clear up in a day or so.  SYMPTOMS TO REPORT IMMEDIATELY:   Following lower endoscopy (colonoscopy or flexible sigmoidoscopy):  Excessive amounts of blood in the stool  Significant tenderness or worsening of abdominal pains  Swelling of the abdomen that is new, acute  Fever of 100F or higher   For urgent or emergent issues, a gastroenterologist can be reached at any hour by calling [phone].   DIET:  We do recommend a small meal at first, but then you may proceed to your regular diet.  Drink plenty of fluids but you should avoid alcoholic beverages for 24 hours. Try to increase the fiber in your diet, and drink plenty of water.  ACTIVITY:  You should plan to take it easy for the rest of today and you should NOT DRIVE or use heavy machinery until  tomorrow (because of the sedation medicines used during the test).    FOLLOW UP: Our staff will call the number listed on your records the next business day following your procedure to check on you and address any questions or concerns that you may have regarding the information given to you following your procedure. If we do not reach you, we will leave a message.  However, if you are feeling well and you are not experiencing any problems, there is no need to return our call.  We will assume that you have returned to your regular daily activities without incident.  If any biopsies were taken you will be contacted by phone or by letter within the next 1-3 weeks.  Please call us at [phone] if you have not heard about the biopsies in 3 weeks.    SIGNATURES/CONFIDENTIALITY: You and/or your care partner have signed paperwork which will be entered into your electronic medical record.  These signatures attest to the fact that that the information above on your After Visit Summary has been reviewed and is understood.  Full responsibility of the confidentiality of this discharge information lies with you and/or your care-partner.  Read all of the handouts given to you by your recovery room nurse.  Thank-you for choosing us for your healthcare needs today. 

## 2016-02-16 NOTE — Op Note (Signed)
[facility] Patient Name: Amanda [name] Procedure Date: 02/16/2016 7:58 AM MRN: [id number] Endoscopist: Mallie Mussel L. Loletha Carrow , MD Age: 70 Referring MD:  Date of Birth: 1945/07/26 Gender: Female Account #: [id number] Procedure:                Colonoscopy Indications:              Screening in patient at increased risk: Colorectal                            cancer in father 75 or older, Screening in patient                            at increased risk: Colorectal cancer in sister                            before age 58 Medicines:                Monitored Anesthesia Care Procedure:                Pre-Anesthesia Assessment:                           - Prior to the procedure, a History and Physical                            was performed, and patient medications and                            allergies were reviewed. The patient's tolerance of                            previous anesthesia was also reviewed. The risks                            and benefits of the procedure and the sedation                            options and risks were discussed with the patient.                            All questions were answered, and informed consent                            was obtained. Anticoagulants: The patient has taken                            aspirin. It was decided not to withhold this                            medication prior to the procedure. ASA Grade                            Assessment: II - A patient with mild systemic  disease. After reviewing the risks and benefits,                            the patient was deemed in satisfactory condition to                            undergo the procedure.                           After obtaining informed consent, the colonoscope                            was passed under direct vision. Throughout the                            procedure, the patient's blood pressure, pulse, and      oxygen saturations were monitored continuously. The                            Model CF-HQ190L [id number]) scope was introduced                            through the anus and advanced to the the cecum,                            identified by appendiceal orifice and ileocecal                            valve. The ileocecal valve, appendiceal orifice,                            and rectum were photographed. The quality of the                            bowel preparation was excellent. The colonoscopy                            was performed without difficulty. The patient                            tolerated the procedure well. The bowel preparation                            used was SUPREP. The quality of the bowel                            preparation was evaluated using the BBPS [facility]                            Bowel Preparation Scale) with scores of: Right                            Colon = 3, Transverse Colon = 3 and Left Colon = 3                            (  entire mucosa seen well with no residual staining,                            small fragments of stool or opaque liquid). The                            total BBPS score equals 9. Scope In: 8:03:16 AM Scope Out: 8:13:44 AM Scope Withdrawal Time: 0 hours 8 minutes 8 seconds  Total Procedure Duration: 0 hours 10 minutes 28 seconds  Findings:                 The exam was otherwise without abnormality on                            direct and retroflexion views.                           Multiple medium-mouthed diverticula were found in                            the left colon. Complications:            No immediate complications. Estimated blood loss:                            None. Estimated Blood Loss:     Estimated blood loss: none. Impression:               - The examination was otherwise normal on direct                            and retroflexion views.                           - Diverticulosis in the left colon.                            - No specimens collected. Recommendation:           - Repeat colonoscopy in 5 years for screening                            purposes.                           - Patient has a contact number available for                            emergencies. The signs and symptoms of potential                            delayed complications were discussed with the                            patient. Return to normal activities tomorrow.  Written discharge instructions were provided to the                            patient.                           - Resume previous diet.                           - Continue present medications.                           - Use Linzess (linaclotide) 72 mcg PO daily for 2                            weeks (Disp #30, RF zero). Begin the day after                            tomorrow.                           Call Dr Loletha Carrow' office in 2 weeks with an update on                            the effect of that medicine. Zlata Alcaide L. Loletha Carrow, MD 02/16/2016 8:19:23 AM This report has been signed electronically.

## 2016-02-16 NOTE — Progress Notes (Signed)
Report to PACU, RN, vss, BBS= Clear.  

## 2016-02-17 ENCOUNTER — Telehealth: Payer: Self-pay | Admitting: Gastroenterology

## 2016-02-17 ENCOUNTER — Telehealth: Payer: Self-pay

## 2016-02-17 ENCOUNTER — Other Ambulatory Visit: Payer: Self-pay

## 2016-02-17 MED ORDER — LINACLOTIDE 72 MCG PO CAPS: 72.0000 ug | ORAL_CAPSULE | Freq: Every day | ORAL | 0 refills | Status: DC

## 2016-02-17 NOTE — Telephone Encounter (Signed)
Pt had a colonoscopy on 02-16-2016 and was told to try Linzess 72 mcg daily for 2 weeks. Rx sent in ad directed.

## 2016-02-17 NOTE — Telephone Encounter (Signed)
  Follow up Call-  Call back number 02/16/2016  Post procedure Call Back phone  # [phone]  Permission to leave phone message Yes  Some recent data might be hidden     Patient questions:  Do you have a fever, pain , or abdominal swelling? No. Pain Score  0 *  Have you tolerated food without any problems? Yes.  Have you been able to return to your normal activities? Yes.  Do you have any questions about your discharge instructions: Diet   No. Medications  No. Follow up visit  No.   Do you have questions or concerns about your Care? No.  Actions: * If pain score is 4 or above: No action needed, pain <4.

## 2016-02-23 DIAGNOSIS — H04123 Dry eye syndrome of bilateral lacrimal glands: Secondary | ICD-10-CM | POA: Diagnosis not present

## 2016-02-23 DIAGNOSIS — H524 Presbyopia: Secondary | ICD-10-CM | POA: Diagnosis not present

## 2016-03-07 DIAGNOSIS — E781 Pure hyperglyceridemia: Secondary | ICD-10-CM | POA: Diagnosis not present

## 2016-03-07 DIAGNOSIS — E559 Vitamin D deficiency, unspecified: Secondary | ICD-10-CM | POA: Diagnosis not present

## 2016-03-07 DIAGNOSIS — Z0000 Encounter for general adult medical examination without abnormal findings: Secondary | ICD-10-CM | POA: Diagnosis not present

## 2016-03-07 DIAGNOSIS — I10 Essential (primary) hypertension: Secondary | ICD-10-CM | POA: Diagnosis not present

## 2016-03-07 DIAGNOSIS — F419 Anxiety disorder, unspecified: Secondary | ICD-10-CM | POA: Diagnosis not present

## 2016-03-08 DIAGNOSIS — M25551 Pain in right hip: Secondary | ICD-10-CM | POA: Diagnosis not present

## 2016-03-08 DIAGNOSIS — M7581 Other shoulder lesions, right shoulder: Secondary | ICD-10-CM | POA: Insufficient documentation

## 2016-03-08 DIAGNOSIS — Z23 Encounter for immunization: Secondary | ICD-10-CM | POA: Diagnosis not present

## 2016-03-08 DIAGNOSIS — M7631 Iliotibial band syndrome, right leg: Secondary | ICD-10-CM | POA: Insufficient documentation

## 2016-03-08 DIAGNOSIS — M25511 Pain in right shoulder: Secondary | ICD-10-CM | POA: Diagnosis not present

## 2016-03-08 DIAGNOSIS — Z0000 Encounter for general adult medical examination without abnormal findings: Secondary | ICD-10-CM | POA: Diagnosis not present

## 2016-03-08 DIAGNOSIS — S4991XA Unspecified injury of right shoulder and upper arm, initial encounter: Secondary | ICD-10-CM | POA: Diagnosis not present

## 2016-03-08 DIAGNOSIS — M7541 Impingement syndrome of right shoulder: Secondary | ICD-10-CM | POA: Insufficient documentation

## 2016-03-08 HISTORY — DX: Other shoulder lesions, right shoulder: M75.81

## 2016-03-14 DIAGNOSIS — M7061 Trochanteric bursitis, right hip: Secondary | ICD-10-CM | POA: Insufficient documentation

## 2016-03-14 DIAGNOSIS — M7581 Other shoulder lesions, right shoulder: Secondary | ICD-10-CM | POA: Diagnosis not present

## 2016-03-14 DIAGNOSIS — M7541 Impingement syndrome of right shoulder: Secondary | ICD-10-CM | POA: Diagnosis not present

## 2016-03-14 DIAGNOSIS — M7631 Iliotibial band syndrome, right leg: Secondary | ICD-10-CM | POA: Diagnosis not present

## 2016-03-19 HISTORY — PX: LUMBAR DISC SURGERY: SHX700

## 2016-06-08 DIAGNOSIS — G8929 Other chronic pain: Secondary | ICD-10-CM

## 2016-06-08 DIAGNOSIS — M5441 Lumbago with sciatica, right side: Secondary | ICD-10-CM | POA: Diagnosis not present

## 2016-06-08 DIAGNOSIS — M533 Sacrococcygeal disorders, not elsewhere classified: Secondary | ICD-10-CM | POA: Diagnosis not present

## 2016-06-08 DIAGNOSIS — I10 Essential (primary) hypertension: Secondary | ICD-10-CM | POA: Diagnosis not present

## 2016-06-08 HISTORY — DX: Other chronic pain: G89.29

## 2016-06-13 ENCOUNTER — Telehealth: Payer: Self-pay | Admitting: Cardiology

## 2016-06-13 NOTE — Telephone Encounter (Signed)
Spoke with pt, she had a shot in her back and she was started on dexamethasone dose pack. She started having a lot of side effects, her bp elevated and she noticed her pulse was up in the 80's. She took the last dose on Monday and has seen improvement since then. Her bp is still elevated and pulse is fluctuating. Reassurance given to the patient, she was advised to stay off the dexamethasone. She will call me back on Friday if she does not continue to see improvement. Pt agreed with this plan.

## 2016-06-13 NOTE — Telephone Encounter (Signed)
Pt says her heart rate have been going up and down,just not feeling that good. She thinks pehap the Steroid shot might be the cause of this. I gave her an appt with Bernerd Pho on Thursday.Please call pt to evaluate,to see if she needs to be seen sooner.Marland Kitchen

## 2016-06-20 NOTE — Progress Notes (Signed)
Cardiology Office Note    Date:  06/21/2016   ID:  Amanda [name], DOB [birth year], MRN [id number]  PCP:  Amanda Ruddle, MD  Cardiologist: Dr. [name]  Chief Complaint  Patient presents with  . Follow-up    History of Present Illness:    Amanda [name] is a 71 y.o. female with past medical history of CAD (70% stenosis of small non-dominant RCA by cath in 01/2014 --> medically managed) and HTN who presents to the office today for routine follow-up.   Was last seen by Dr. [name] in 06/2015 and reported occasional episodes of dyspnea on exertion but denied any acute worsening of her symptoms or associated chest discomfort. Blood pressure was noted to be elevated at that time, therefore Amlodipine was increased to 10mg  daily.   In talking with the patient today, she reports having a lower back injection last week and developed an array of symptoms after this. She reports a numbness and tingling down her arms and feet bilaterally, which worsened after she took PO steroids following the prcoedure. She reports starting to panic when this occurred and felt short of breath at that time. Her symptoms did improve after several hours but she felt like her heart rate was slow and that it "might stop". She checked her blood pressure at that time and it was in the 140's with a pulse rate in the mid-50s.  She denies any repeat episodes since. She has continued to check her blood pressure regularly and has a recording of systolic readings in the 809'X to 150's. Pulse is usually in the low to mid 50's. She has followed up with her PCP provider in regards to her elevated blood pressure and she reports he recommended waiting until she is further out from her injection before changing her medication regimen. She had been on Amlodipine previously but this was discontinued secondary to edema. Was started on Atenolol 25mg  daily in place of this.   She denies any recent chest discomfort or dyspnea on exertion.  No lightheadedness, dizziness, presyncope, or syncope.  Past Medical History:  Diagnosis Date  . Arthritis   . Back pain   . CAD (coronary artery disease)    a. 01/2014: cath showing 70% stenosis of non-dominant RCA --> medically managed.   . Cataract    bilat removed  . Diverticulosis   . GERD (gastroesophageal reflux disease)   . Hemorrhoids   . Hiatal hernia   . Hyperlipidemia   . Hypertension   . Melanoma ([place])   . Osteopenia 07/2015   T score -2.0 FRAX 10%/1.4%  . Pituitary tumor   . Stricture of esophagus   . Vitamin D deficiency     Past Surgical History:  Procedure Laterality Date  . BACK SURGERY     x 2  . COLONOSCOPY    . EXCISION OF SKIN CANCER     Melanoma  . FOOT SURGERY    . HEMORRHOID BANDING     X3  . LEFT HEART CATHETERIZATION WITH CORONARY ANGIOGRAM N/A 02/03/2014   Procedure: LEFT HEART CATHETERIZATION WITH CORONARY ANGIOGRAM;  Surgeon: [name], MD;  Location: [facility] CATH LAB;  Service: Cardiovascular;  Laterality: N/A;  . OOPHORECTOMY  2012   BSO  . PELVIC LAPAROSCOPY  2012   Diag Lap-BSO-lysis of adhesions  . TUBAL LIGATION    . VAGINAL HYSTERECTOMY  1993    Current Medications: Outpatient Medications Prior to Visit  Medication Sig Dispense Refill  . ALPRAZolam (XANAX) 0.5  MG tablet Take 0.5-1 tablets (0.25-0.5 mg total) by mouth 4 (four) times daily as needed for anxiety. 20 tablet 0  . aspirin EC 81 MG tablet Take 1 tablet (81 mg total) by mouth daily. 90 tablet 3  . atenolol (TENORMIN) 25 MG tablet Take 25 mg by mouth daily.     . Cholecalciferol (VITAMIN D PO) Take by mouth.    . estradiol (ESTRACE) 1 MG tablet Take 0.5 tablets (0.5 mg total) by mouth daily. 90 tablet 3  . HYDROcodone-acetaminophen (NORCO) 10-325 MG tablet Take 1-2 tablets by mouth every 6 (six) hours as needed.  0  . linaclotide (LINZESS) 72 MCG capsule Take 1 capsule (72 mcg total) by mouth daily before breakfast. 30 capsule 0  . omeprazole (PRILOSEC) 40 MG capsule  Take 1 capsule daily 90 capsule 3  . UNABLE TO FIND Med Name: GAIA Herbs Dietary supplament    . VOLTAREN 1 % GEL Apply 2 g topically daily as needed (pain).      Facility-Administered Medications Prior to Visit  Medication Dose Route Frequency Provider Last Rate Last Dose  . 0.9 %  sodium chloride infusion  500 mL Intravenous Continuous Nelida Meuse III, MD      . linaclotide Rolan Lipa) capsule 145 mcg  145 mcg Oral QAC breakfast Nelida Meuse III, MD         Allergies:   Lisinopril and Pravastatin   Social History   Social History  . Marital status: Married    Spouse name: N/A  . Number of children: 3  . Years of education: N/A   Occupational History  . Disabled    Social History Main Topics  . Smoking status: Former Smoker    Quit date: 03/19/1994  . Smokeless tobacco: Never Used  . Alcohol use No  . Drug use: No  . Sexual activity: Not Currently    Birth control/ protection: Surgical     Comment: 1st intercourse 71 yo-Fewer than 5 partners   Other Topics Concern  . Not on file   Social History Narrative  . No narrative on file     Family History:  The patient's family history includes Cancer in her brother and father; Colon cancer in her brother, father, and sister; Colon polyps in her brother, father, and sister; Diabetes in her brother and sister; Heart disease in her brother, father, mother, and sister; Hypertension in her father and mother; Melanoma in her brother, brother, and sister; Stroke in her mother.   Review of Systems:   Please see the history of present illness.     General:  No chills, fever, night sweats or weight changes.  Cardiovascular:  No chest pain, dyspnea on exertion, edema, orthopnea, palpitations, paroxysmal nocturnal dyspnea. Dermatological: No rash, lesions/masses Respiratory: No cough, dyspnea Urologic: No hematuria, dysuria Abdominal:   No nausea, vomiting, diarrhea, bright red blood per rectum, melena, or hematemesis Neurologic:  No  visual changes, wkns, changes in mental status. Positive for upper and lower extremity paraesthesias.   All other systems reviewed and are otherwise negative except as noted above.   Physical Exam:    VS:  BP 138/82   Pulse (!) 54   Ht 5\' 5"  (1.651 m)   Wt 167 lb 6.4 oz (75.9 kg)   BMI 27.86 kg/m    General: Well developed, well nourished Caucasian female appearing in no acute distress. Head: Normocephalic, atraumatic, sclera non-icteric, no xanthomas, nares are without discharge.  Neck: No carotid bruits. JVD not  elevated.  Lungs: Respirations regular and unlabored, without wheezes or rales.  Heart: Regular rhythm, bradycardiac rate. No S3 or S4.  No murmur, no rubs, or gallops appreciated. Abdomen: Soft, non-tender, non-distended with normoactive bowel sounds. No hepatomegaly. No rebound/guarding. No obvious abdominal masses. Msk:  Strength and tone appear normal for age. No joint deformities or effusions. Extremities: No clubbing or cyanosis. No lower extremity edema.  Distal pedal pulses are 2+ bilaterally. Neuro: Alert and oriented X 3. Moves all extremities spontaneously. No focal deficits noted. Psych:  Responds to questions appropriately with a normal affect. Skin: No rashes or lesions noted  Wt Readings from Last 3 Encounters:  06/21/16 167 lb 6.4 oz (75.9 kg)  02/16/16 166 lb (75.3 kg)  01/31/16 166 lb 6 oz (75.5 kg)     Studies/Labs Reviewed:   EKG:  EKG is ordered today.  The ekg ordered today demonstrates sinus bradycardia, HR 54, with no acute ST or T-wave changes when compared to prior tracings.   Recent Labs: No results found for requested labs within last 8760 hours.   Lipid Panel No results found for: CHOL, TRIG, HDL, CHOLHDL, VLDL, LDLCALC, LDLDIRECT  Additional studies/ records that were reviewed today include:   Cardiac Catheterization: 01/2014 Procedural Findings: Hemodynamics: AO 152/65 LV 156/13  Coronary angiography: Coronary dominance:  left  Left mainstem: Short segment, widely patent  Left anterior descending (LAD): Patent, large caliber vessel to the LV apex. Patent diagonals. The mid-LAD has mild diffuse nonobstructive plaque.  Left circumflex (LCx): Large, dominant vessel, no obstructive disease. The OM branches are patent.   Right coronary artery (RCA): Small, nondominant vessel. There is a 70% stenosis in the mid-vessel before the origin of a small RV marginal branch  Left ventriculography: Left ventricular systolic function is normal, LVEF is estimated at 55-65%, there is no significant mitral regurgitation   Estimated Blood Loss: minimal  Final Conclusions:   1. Moderate stenosis of a small nondominant RCA 2. Widely patent left main, LAD, and dominant left circumflex 3. Normal LV function  Recommendations: medical therapy.   Assessment:    1. Coronary artery disease involving native coronary artery of native heart without angina pectoris   2. Essential hypertension   3. Paresthesias      Plan:   In order of problems listed above:  1. CAD - has known CAD with 70% stenosis of small non-dominant RCA by cath in 01/2014 --> medically managed at that time.  - she denies any recent anginal symptoms. EKG today is without acute ischemic changes. - continue ASA and BB. Intolerant to statin therapy.   2. HTN - BP slightly elevated at 144/82, at 138/82 on recheck. Reports systolic readings in the 224'M to 150's at home. - would continue with Atenolol 25mg  daily for now. Would not further titrate at this time with borderline bradycardia. Reports readings were well-controlled prior to her recent steroid injection.  - consider the addition of Lisinopril 5mg  daily if BP remains elevated. Unable to tolerate Amlodipine in the past secondary to lower extremity edema.   3. Parasthesias - she received a steroid injection last week, developing numbness and tingling down her arms and feet bilaterally several  hours after the injection. Reports dyspnea and a low HR at that time. Denies any assoictaed chest discomfort, dyspnea on exertion, lightheadedness, dizziness, presyncope, or syncope. No repeat symptoms since.  - informed the patient to make her PCP aware of her reaction prior to any repeat injections.    Medication Adjustments/Labs  and Tests Ordered: Current medicines are reviewed at length with the patient today.  Concerns regarding medicines are outlined above.  Medication changes, Labs and Tests ordered today are listed in the Patient Instructions below. Patient Instructions  Medication Instructions:  Continue current medications  Labwork: None Ordered  Testing/Procedures: None Ordered  Follow-Up: Your physician wants you to follow-up in: 1 Year with Dr [name]. You will receive a reminder letter in the mail two months in advance. If you don't receive a letter, please call our office to schedule the follow-up appointment.  Any Other Special Instructions Will Be Listed Below (If Applicable).  If you need a refill on your cardiac medications before your next appointment, please call your pharmacy.  Signed, Erma Heritage, PA-C  06/21/2016 4:01 PM    [facility], [place], [place]  [address] Phone: [phone]; Fax: [phone]  [address] [place], [place] [address] Phone: [phone]

## 2016-06-21 ENCOUNTER — Encounter: Payer: Self-pay | Admitting: Student

## 2016-06-21 ENCOUNTER — Ambulatory Visit (INDEPENDENT_AMBULATORY_CARE_PROVIDER_SITE_OTHER): Payer: PPO | Admitting: Student

## 2016-06-21 VITALS — BP 138/82 | HR 54 | Ht 65.0 in | Wt 167.4 lb

## 2016-06-21 DIAGNOSIS — I10 Essential (primary) hypertension: Secondary | ICD-10-CM

## 2016-06-21 DIAGNOSIS — I2510 Atherosclerotic heart disease of native coronary artery without angina pectoris: Secondary | ICD-10-CM

## 2016-06-21 DIAGNOSIS — R202 Paresthesia of skin: Secondary | ICD-10-CM | POA: Diagnosis not present

## 2016-06-21 NOTE — Patient Instructions (Signed)
Medication Instructions:  Continue current medications  Labwork: None Ordered  Testing/Procedures: None Ordered  Follow-Up: Your physician wants you to follow-up in: 1 Year with Dr [name]. You will receive a reminder letter in the mail two months in advance. If you don't receive a letter, please call our office to schedule the follow-up appointment.   Any Other Special Instructions Will Be Listed Below (If Applicable).   If you need a refill on your cardiac medications before your next appointment, please call your pharmacy.

## 2016-06-22 NOTE — Addendum Note (Signed)
Addended by: Vennie Homans on: 06/22/2016 04:10 PM   Modules accepted: Orders

## 2016-06-25 ENCOUNTER — Other Ambulatory Visit: Payer: Self-pay | Admitting: Gynecology

## 2016-06-25 DIAGNOSIS — Z1231 Encounter for screening mammogram for malignant neoplasm of breast: Secondary | ICD-10-CM

## 2016-07-03 DIAGNOSIS — I10 Essential (primary) hypertension: Secondary | ICD-10-CM | POA: Diagnosis not present

## 2016-07-03 DIAGNOSIS — J358 Other chronic diseases of tonsils and adenoids: Secondary | ICD-10-CM | POA: Diagnosis not present

## 2016-07-03 DIAGNOSIS — M5441 Lumbago with sciatica, right side: Secondary | ICD-10-CM | POA: Diagnosis not present

## 2016-07-03 DIAGNOSIS — M5417 Radiculopathy, lumbosacral region: Secondary | ICD-10-CM | POA: Diagnosis not present

## 2016-07-03 DIAGNOSIS — G8929 Other chronic pain: Secondary | ICD-10-CM | POA: Diagnosis not present

## 2016-07-03 DIAGNOSIS — R300 Dysuria: Secondary | ICD-10-CM | POA: Diagnosis not present

## 2016-07-05 DIAGNOSIS — M5126 Other intervertebral disc displacement, lumbar region: Secondary | ICD-10-CM | POA: Diagnosis not present

## 2016-07-06 ENCOUNTER — Other Ambulatory Visit: Payer: Self-pay | Admitting: Neurosurgery

## 2016-07-06 DIAGNOSIS — M5126 Other intervertebral disc displacement, lumbar region: Secondary | ICD-10-CM

## 2016-07-12 ENCOUNTER — Ambulatory Visit: Payer: PPO

## 2016-07-18 ENCOUNTER — Ambulatory Visit
Admission: RE | Admit: 2016-07-18 | Discharge: 2016-07-18 | Disposition: A | Discharge status: Home / Self Care | Payer: PPO | Source: Ambulatory Visit | Attending: Neurosurgery | Admitting: Neurosurgery

## 2016-07-18 DIAGNOSIS — M545 Low back pain: Secondary | ICD-10-CM | POA: Diagnosis not present

## 2016-07-18 DIAGNOSIS — M5126 Other intervertebral disc displacement, lumbar region: Secondary | ICD-10-CM

## 2016-07-18 MED ORDER — IOPAMIDOL (ISOVUE-M 200) INJECTION 41%
1.0000 mL | Freq: Once | INTRAMUSCULAR | Status: AC
  Administered 2016-07-18: 1 mL via EPIDURAL

## 2016-07-18 MED ORDER — METHYLPREDNISOLONE ACETATE 40 MG/ML INJ SUSP (RADIOLOG
120.0000 mg | Freq: Once | INTRAMUSCULAR | Status: AC
  Administered 2016-07-18: 120 mg via EPIDURAL

## 2016-07-18 NOTE — Discharge Instructions (Signed)

## 2016-08-02 DIAGNOSIS — I10 Essential (primary) hypertension: Secondary | ICD-10-CM | POA: Diagnosis not present

## 2016-08-02 DIAGNOSIS — F419 Anxiety disorder, unspecified: Secondary | ICD-10-CM | POA: Diagnosis not present

## 2016-08-08 DIAGNOSIS — M5126 Other intervertebral disc displacement, lumbar region: Secondary | ICD-10-CM | POA: Diagnosis not present

## 2016-08-20 DIAGNOSIS — M5126 Other intervertebral disc displacement, lumbar region: Secondary | ICD-10-CM | POA: Diagnosis not present

## 2016-08-27 DIAGNOSIS — Z833 Family history of diabetes mellitus: Secondary | ICD-10-CM | POA: Diagnosis not present

## 2016-08-27 DIAGNOSIS — Z886 Allergy status to analgesic agent status: Secondary | ICD-10-CM | POA: Diagnosis not present

## 2016-08-27 DIAGNOSIS — J449 Chronic obstructive pulmonary disease, unspecified: Secondary | ICD-10-CM | POA: Diagnosis not present

## 2016-08-27 DIAGNOSIS — Z85828 Personal history of other malignant neoplasm of skin: Secondary | ICD-10-CM | POA: Diagnosis not present

## 2016-08-27 DIAGNOSIS — M5127 Other intervertebral disc displacement, lumbosacral region: Secondary | ICD-10-CM | POA: Diagnosis not present

## 2016-08-27 DIAGNOSIS — Z8249 Family history of ischemic heart disease and other diseases of the circulatory system: Secondary | ICD-10-CM | POA: Diagnosis not present

## 2016-08-27 DIAGNOSIS — Z79899 Other long term (current) drug therapy: Secondary | ICD-10-CM | POA: Diagnosis not present

## 2016-08-27 DIAGNOSIS — I10 Essential (primary) hypertension: Secondary | ICD-10-CM | POA: Diagnosis not present

## 2016-08-27 DIAGNOSIS — F419 Anxiety disorder, unspecified: Secondary | ICD-10-CM | POA: Diagnosis not present

## 2016-08-27 DIAGNOSIS — K219 Gastro-esophageal reflux disease without esophagitis: Secondary | ICD-10-CM | POA: Diagnosis not present

## 2016-08-27 DIAGNOSIS — Z888 Allergy status to other drugs, medicaments and biological substances status: Secondary | ICD-10-CM | POA: Diagnosis not present

## 2016-08-27 DIAGNOSIS — Z981 Arthrodesis status: Secondary | ICD-10-CM | POA: Diagnosis not present

## 2016-08-27 DIAGNOSIS — M1990 Unspecified osteoarthritis, unspecified site: Secondary | ICD-10-CM | POA: Diagnosis not present

## 2016-09-03 DIAGNOSIS — M5106 Intervertebral disc disorders with myelopathy, lumbar region: Secondary | ICD-10-CM | POA: Diagnosis not present

## 2016-09-03 DIAGNOSIS — M5127 Other intervertebral disc displacement, lumbosacral region: Secondary | ICD-10-CM | POA: Diagnosis not present

## 2016-09-03 DIAGNOSIS — M5126 Other intervertebral disc displacement, lumbar region: Secondary | ICD-10-CM | POA: Diagnosis not present

## 2016-09-25 DIAGNOSIS — C44321 Squamous cell carcinoma of skin of nose: Secondary | ICD-10-CM | POA: Diagnosis not present

## 2016-09-25 DIAGNOSIS — L814 Other melanin hyperpigmentation: Secondary | ICD-10-CM | POA: Diagnosis not present

## 2016-09-25 DIAGNOSIS — D225 Melanocytic nevi of trunk: Secondary | ICD-10-CM | POA: Diagnosis not present

## 2016-09-25 DIAGNOSIS — D485 Neoplasm of uncertain behavior of skin: Secondary | ICD-10-CM | POA: Diagnosis not present

## 2016-09-25 DIAGNOSIS — L570 Actinic keratosis: Secondary | ICD-10-CM | POA: Diagnosis not present

## 2016-09-25 DIAGNOSIS — C44612 Basal cell carcinoma of skin of right upper limb, including shoulder: Secondary | ICD-10-CM | POA: Diagnosis not present

## 2016-09-25 DIAGNOSIS — D2239 Melanocytic nevi of other parts of face: Secondary | ICD-10-CM | POA: Diagnosis not present

## 2016-09-27 DIAGNOSIS — I10 Essential (primary) hypertension: Secondary | ICD-10-CM | POA: Diagnosis not present

## 2016-09-27 DIAGNOSIS — M62838 Other muscle spasm: Secondary | ICD-10-CM | POA: Diagnosis not present

## 2016-09-27 DIAGNOSIS — M549 Dorsalgia, unspecified: Secondary | ICD-10-CM | POA: Diagnosis not present

## 2016-10-01 DIAGNOSIS — I10 Essential (primary) hypertension: Secondary | ICD-10-CM | POA: Diagnosis not present

## 2016-10-01 DIAGNOSIS — M549 Dorsalgia, unspecified: Secondary | ICD-10-CM | POA: Diagnosis not present

## 2016-10-01 DIAGNOSIS — M62838 Other muscle spasm: Secondary | ICD-10-CM | POA: Diagnosis not present

## 2016-10-16 DIAGNOSIS — C44612 Basal cell carcinoma of skin of right upper limb, including shoulder: Secondary | ICD-10-CM | POA: Diagnosis not present

## 2016-11-07 DIAGNOSIS — M5126 Other intervertebral disc displacement, lumbar region: Secondary | ICD-10-CM

## 2016-11-07 HISTORY — DX: Other intervertebral disc displacement, lumbar region: M51.26

## 2016-11-27 ENCOUNTER — Ambulatory Visit (INDEPENDENT_AMBULATORY_CARE_PROVIDER_SITE_OTHER): Payer: PPO | Admitting: Gynecology

## 2016-11-27 ENCOUNTER — Encounter: Payer: Self-pay | Admitting: Gynecology

## 2016-11-27 VITALS — BP 142/80 | Ht 65.0 in | Wt 165.0 lb

## 2016-11-27 DIAGNOSIS — Z01411 Encounter for gynecological examination (general) (routine) with abnormal findings: Secondary | ICD-10-CM | POA: Diagnosis not present

## 2016-11-27 DIAGNOSIS — M8589 Other specified disorders of bone density and structure, multiple sites: Secondary | ICD-10-CM | POA: Diagnosis not present

## 2016-11-27 DIAGNOSIS — N951 Menopausal and female climacteric states: Secondary | ICD-10-CM

## 2016-11-27 DIAGNOSIS — N952 Postmenopausal atrophic vaginitis: Secondary | ICD-10-CM

## 2016-11-27 DIAGNOSIS — N3945 Continuous leakage: Secondary | ICD-10-CM | POA: Diagnosis not present

## 2016-11-27 DIAGNOSIS — M8580 Other specified disorders of bone density and structure, unspecified site: Secondary | ICD-10-CM | POA: Diagnosis not present

## 2016-11-27 MED ORDER — ESTRADIOL 1 MG PO TABS
0.5000 mg | ORAL_TABLET | Freq: Every day | ORAL | 3 refills | Status: DC
Start: 2016-11-27 — End: 2017-01-29

## 2016-11-27 NOTE — Progress Notes (Signed)
Amanda [name] 10-07-1945 [id number]        71 y.o.  [id number] for breast and pelvic exam. Patient also complaining of worsening urinary leakage and menopausal symptoms to include hot flushes and sweats. Previously on estradiol 0.5 mg daily but discontinued this last year and finds that her symptoms are becoming intolerable.  Past medical history,surgical history, problem list, medications, allergies, family history and social history were all reviewed and documented as reviewed in the EPIC chart.  ROS:  Performed with pertinent positives and negatives included in the history, assessment and plan.   Additional significant findings :  None   Exam: Wandra Scot assistant Vitals:   11/27/16 1105  BP: (!) 142/80  Weight: 165 lb (74.8 kg)  Height: 5\' 5"  (1.651 m)   Body mass index is 27.46 kg/m.  General appearance:  Normal affect, orientation and appearance. Skin: Grossly normal HEENT: Without gross lesions.  No cervical or supraclavicular adenopathy. Thyroid normal.  Lungs:  Clear without wheezing, rales or rhonchi Cardiac: RR, without RMG Abdominal:  Soft, nontender, without masses, guarding, rebound, organomegaly or hernia Breasts:  Examined lying and sitting without masses, retractions, discharge or axillary adenopathy. Pelvic:  Ext, BUS, Vagina: With atrophic changes  Adnexa: Without masses or tenderness    Anus and perineum: Normal   Rectovaginal: Normal sphincter tone without palpated masses or tenderness.    Assessment/Plan:  71 y.o. G8T1572 female for breast and pelvic exam. Status post TVH for leiomyoma/bleeding and subsequent BSO for hydrosalpinx..   1. Postmenopausal/atrophic genital changes/menopausal symptoms. I previously been on estradiol 0.5 mg but discontinued. Now with unacceptable hot flushes and night sweats. I discussed options with her to include OTC products, pharmacologic nonhormonal products such as Effexor and HRT. Risks versus benefits reviewed to  include the issues of thrombosis breast cancer versus benefits of symptom relief and possible cardiovascular bone health particularly when started early. Issues of reinitiating in her 77s possible increased risk of stroke also discussed. Delivery reviewed to include transdermal versus oral first pass effect benefits from a decreased thrombosis risk with transdermal discussed. Patient had tried patches in the past and had unacceptable skin reaction. After a lengthy discussion we both agreed initiate estradiol 0.5 mg. She prefers the 1 mg tablets that she breaks in half. Annual prescription written. Follow up if any issues once initiating. 2. Osteopenia.  DEXA 07/2015 T score -2.0 FRAX 10%/1.4%. Plan repeat DEXA next year to year interval. History of vitamin D deficiency in the past. Check vitamin D level today. 3. Urinary incontinence. Patient notes has always had an issue with stress like incontinence with laughing coughing sneezing but over the last 6 months or so she's noticed increase in dribbling throughout the day when she is not even aware of this. Not having urgency symptoms. No dysuria or frequency low back pain fever or chills. Check urinalysis today. We discussed in detail the various forms of incontinence to include stress, urge and sphincter insufficiency. Options for urology referral now her testing and treatment options versus reinitiation of her estrogen to see if this does not help somewhat with her symptoms and then decide if she wants referral discussed. The patient decided to wait, reinitiate her ERT and then see how she does. Will call if she wants to be all up by the urologist. 4. Pap smear 2012. No Pap smear done today. No history of abnormal Pap smears. We both agree to stop screening based on age and hysterectomy history per current screening guidelines.  5. Colonoscopy 2017. Repeat at their recommended interval. 6. Mammography 05/2015 overdue now and patient knows to call schedule. Breast  exam normal today. 7. Health maintenance. Mild elevated blood pressure 142/80 noted the patient. She is working with her primary physician to control her blood pressure and will continue to follow up with them. No routine lab work done as patient does this elsewhere. Follow up in one year, sooner as needed.  Additional 15 minutes time in excess of her breast and pelvic exam was spent in direct face to face counseling and coordination of care in regards to her menopausal symptoms, hormone replacement treatment discussion and ultimately prescription as well as her urinary incontinence.    Anastasio Auerbach MD, 11:26 AM 11/27/2016

## 2016-11-27 NOTE — Patient Instructions (Addendum)
Call if the issue with urine leakage continues and we will arrange an appointment to see the urologist.   Call to Schedule your mammogram  Facilities in [place]: 1)  [facility]. [address] [place] Phone: [phone] 2)  Dr. Isaiah Blakes at [facility] [address] Phone: [phone]     Mammogram A mammogram is an X-ray test to find changes in a woman's breast. You should get a mammogram if:  You are 59 years of age or older  You have risk factors.   Your doctor recommends that you have one.  BEFORE THE TEST  Do not schedule the test the week before your period, especially if your breasts are sore during this time.  On the day of your mammogram:  Wash your breasts and armpits well. After washing, do not put on any deodorant or talcum powder on until after your test.   Eat and drink as you usually do.   Take your medicines as usual.   If you are diabetic and take insulin, make sure you:   Eat before coming for your test.   Take your insulin as usual.   If you cannot keep your appointment, call before the appointment to cancel. Schedule another appointment.  TEST  You will need to undress from the waist up. You will put on a hospital gown.   Your breast will be put on the mammogram machine, and it will press firmly on your breast with a piece of plastic called a compression paddle. This will make your breast flatter so that the machine can X-ray all parts of your breast.   Both breasts will be X-rayed. Each breast will be X-rayed from above and from the side. An X-ray might need to be taken again if the picture is not good enough.   The mammogram will last about 15 to 30 minutes.  AFTER THE TEST Finding out the results of your test Ask when your test results will be ready. Make sure you get your test results.  Document Released: 06/01/2008 Document Revised: 02/22/2011 Document Reviewed:  06/01/2008 [facility] Patient Information 2012 [place].

## 2016-11-28 LAB — VITAMIN D 25 HYDROXY (VIT D DEFICIENCY, FRACTURES): Vit D, 25-Hydroxy: 35 ng/mL (ref 30–100)

## 2016-11-29 LAB — URINALYSIS W MICROSCOPIC + REFLEX CULTURE
BILIRUBIN URINE: NEGATIVE
Bacteria, UA: NONE SEEN /HPF
GLUCOSE, UA: NEGATIVE
Hgb urine dipstick: NEGATIVE
Hyaline Cast: NONE SEEN /LPF
Ketones, ur: NEGATIVE
NITRITES URINE, INITIAL: NEGATIVE
PH: 7.5 (ref 5.0–8.0)
PROTEIN: NEGATIVE
RBC / HPF: NONE SEEN /HPF (ref 0–2)
SPECIFIC GRAVITY, URINE: 1.005 (ref 1.001–1.03)
Squamous Epithelial / LPF: NONE SEEN /HPF (ref <=5)

## 2016-11-29 LAB — URINE CULTURE
MICRO NUMBER: 81005556
SPECIMEN QUALITY: ADEQUATE

## 2016-11-29 LAB — CULTURE INDICATED

## 2016-12-31 DIAGNOSIS — G8929 Other chronic pain: Secondary | ICD-10-CM | POA: Diagnosis not present

## 2016-12-31 DIAGNOSIS — M5441 Lumbago with sciatica, right side: Secondary | ICD-10-CM | POA: Diagnosis not present

## 2016-12-31 DIAGNOSIS — M25511 Pain in right shoulder: Secondary | ICD-10-CM | POA: Diagnosis not present

## 2017-01-03 DIAGNOSIS — R300 Dysuria: Secondary | ICD-10-CM | POA: Diagnosis not present

## 2017-01-08 DIAGNOSIS — Z808 Family history of malignant neoplasm of other organs or systems: Secondary | ICD-10-CM | POA: Diagnosis not present

## 2017-01-08 DIAGNOSIS — D229 Melanocytic nevi, unspecified: Secondary | ICD-10-CM | POA: Diagnosis not present

## 2017-01-08 DIAGNOSIS — L570 Actinic keratosis: Secondary | ICD-10-CM | POA: Diagnosis not present

## 2017-01-08 DIAGNOSIS — Z85828 Personal history of other malignant neoplasm of skin: Secondary | ICD-10-CM | POA: Diagnosis not present

## 2017-01-08 DIAGNOSIS — Z800 Family history of malignant neoplasm of digestive organs: Secondary | ICD-10-CM | POA: Diagnosis not present

## 2017-01-21 DIAGNOSIS — R300 Dysuria: Secondary | ICD-10-CM | POA: Diagnosis not present

## 2017-01-21 DIAGNOSIS — B373 Candidiasis of vulva and vagina: Secondary | ICD-10-CM | POA: Diagnosis not present

## 2017-01-29 ENCOUNTER — Ambulatory Visit (INDEPENDENT_AMBULATORY_CARE_PROVIDER_SITE_OTHER): Payer: PPO | Admitting: Gynecology

## 2017-01-29 ENCOUNTER — Encounter: Payer: Self-pay | Admitting: Gynecology

## 2017-01-29 VITALS — BP 122/78

## 2017-01-29 DIAGNOSIS — N898 Other specified noninflammatory disorders of vagina: Secondary | ICD-10-CM | POA: Diagnosis not present

## 2017-01-29 DIAGNOSIS — R1031 Right lower quadrant pain: Secondary | ICD-10-CM

## 2017-01-29 LAB — WET PREP FOR TRICH, YEAST, CLUE

## 2017-01-29 MED ORDER — ESTRADIOL 1 MG PO TABS: 1.0000 mg | ORAL_TABLET | Freq: Every day | ORAL | 3 refills | Status: DC

## 2017-01-29 MED ORDER — TERCONAZOLE 0.4 % VA CREA: 1.0000 | TOPICAL_CREAM | Freq: Every day | VAGINAL | 0 refills | Status: DC

## 2017-01-29 NOTE — Patient Instructions (Signed)
Use the Terazol 7-day cream intravaginally at bedtime for 7 nights.  Follow-up if your vaginal symptoms continue.  Increase your estrogen pill to a full pill daily if your hot flushes and night sweats continue.  Follow-up with your primary providers if your right lower quadrant pain sided pain continues.

## 2017-01-29 NOTE — Addendum Note (Signed)
Addended by: Nelva Nay on: 01/29/2017 10:38 AM   Modules accepted: Orders

## 2017-01-29 NOTE — Progress Notes (Signed)
    Amanda [name] 1945-07-24 [id number]        71 y.o.  [id number] presents complaining of the past months of vaginal burning with some dysuria and right sided pain.  Was seen by her primary providers several times for the same complaints.  Was treated with ciprofloxacin for a UTI although her urine analysis ultimately did not grow out a dominant bacteria.  She subsequently was treated with Diflucan but a week ago notes that her symptoms as far as the vaginal burning seem to be improving but not gone.  Was recently started back on ERT 11/27/2016 at her annual gynecologic exam and notes still having some hot flashes and night sweats.  History of TVH and BSO in the past.  No nausea vomiting diarrhea constipation.  No frequency dysuria urgency low back pain at this time.  Past medical history,surgical history, problem list, medications, allergies, family history and social history were all reviewed and documented in the EPIC chart.  Directed ROS with pertinent positives and negatives documented in the history of present illness/assessment and plan.  Exam: Caryn Bee assistant Vitals:   01/29/17 0903  BP: 122/78   General appearance:  Normal Spine straight without CVA tenderness Abdomen soft nontender without masses guarding rebound Pelvic external BUS vagina with atrophic changes.  Slight white discharge noted.  Bimanual exam without masses or tenderness.  Assessment/Plan:  71 y.o. F7T0240 with the above history and exam.  Urinalysis appears contaminated with 10-20 squamous cells and moderate bacteria.  Will culture and follow-up on this.  Wet prep is unremarkable.  Given her history and go to treat her as a partially treated yeast vaginitis with Terazol 7-day cream nightly x7 nights.  Patient will follow-up if her vaginal symptoms continue.  Also having her hot flushes and sweats.  We discussed continuing on her estradiol 0.5 mg for now.  If in another month or so she continues to have significant  symptoms she will increase to the 1 mg daily to see if this does not alleviate her symptoms.  If they continue she will follow-up with me for further management.  Lastly we discussed her right lower quadrant pain.  Her exam is negative.  As she has had a TVH BSO in the past I suspect that her pain is GI related or possibly genitourinary.  If her pain would continue she will follow-up with her primary providers for management.  Assubel further evaluation to include CT scan was discussed with her.  Greater than 50% of my time was spent in direct face to face counseling and coordination of care with the patient.      Anastasio Auerbach MD, 9:15 AM 01/29/2017

## 2017-01-30 LAB — URINALYSIS W MICROSCOPIC + REFLEX CULTURE
Bilirubin Urine: NEGATIVE
GLUCOSE, UA: NEGATIVE
HGB URINE DIPSTICK: NEGATIVE
Hyaline Cast: NONE SEEN /LPF
KETONES UR: NEGATIVE
LEUKOCYTE ESTERASE: NEGATIVE
NITRITES URINE, INITIAL: NEGATIVE
PH: 7 (ref 5.0–8.0)
PROTEIN: NEGATIVE
RBC / HPF: NONE SEEN /HPF (ref 0–2)
Specific Gravity, Urine: 1.01 (ref 1.001–1.03)

## 2017-01-30 LAB — URINE CULTURE
MICRO NUMBER: 81279186
RESULT: NO GROWTH
SPECIMEN QUALITY:: ADEQUATE

## 2017-01-30 LAB — NO CULTURE INDICATED

## 2017-02-12 DIAGNOSIS — M5126 Other intervertebral disc displacement, lumbar region: Secondary | ICD-10-CM | POA: Diagnosis not present

## 2017-04-19 DIAGNOSIS — R51 Headache: Secondary | ICD-10-CM | POA: Diagnosis not present

## 2017-04-19 DIAGNOSIS — Z87891 Personal history of nicotine dependence: Secondary | ICD-10-CM | POA: Diagnosis not present

## 2017-04-24 DIAGNOSIS — H538 Other visual disturbances: Secondary | ICD-10-CM | POA: Diagnosis not present

## 2017-04-24 DIAGNOSIS — R51 Headache: Secondary | ICD-10-CM | POA: Diagnosis not present

## 2017-04-27 ENCOUNTER — Other Ambulatory Visit: Payer: Self-pay | Admitting: Gastroenterology

## 2017-04-27 DIAGNOSIS — R51 Headache: Secondary | ICD-10-CM | POA: Diagnosis not present

## 2017-04-29 NOTE — Telephone Encounter (Signed)
I have refilled it for 90 days with no refills. Needs to see me in next 2-3 months if wants me to keep refilling this.  Or can see primary care for it if more convenient.

## 2017-04-29 NOTE — Telephone Encounter (Signed)
Refill request for omeprazole 40 mg po daily. Last seen 01-2016. No current follow up scheduled.

## 2017-04-30 NOTE — Telephone Encounter (Signed)
Pt aware and follow up has already been scheduled.

## 2017-05-01 ENCOUNTER — Ambulatory Visit: Payer: PPO | Admitting: Gastroenterology

## 2017-05-01 ENCOUNTER — Encounter: Payer: Self-pay | Admitting: Gastroenterology

## 2017-05-01 ENCOUNTER — Encounter (INDEPENDENT_AMBULATORY_CARE_PROVIDER_SITE_OTHER): Payer: Self-pay

## 2017-05-01 VITALS — BP 140/78 | HR 60 | Ht 65.0 in | Wt 161.4 lb

## 2017-05-01 DIAGNOSIS — K219 Gastro-esophageal reflux disease without esophagitis: Secondary | ICD-10-CM

## 2017-05-01 MED ORDER — OMEPRAZOLE 40 MG PO CPDR: DELAYED_RELEASE_CAPSULE | ORAL | 0 refills | Status: DC

## 2017-05-01 NOTE — Progress Notes (Signed)
[facility] Progress Note  Chief Complaint: Right lower quadrant pain  Subjective  History:  This is a 72 year old woman known to me from a screening colonoscopy in November 2017 done for family history of colon cancer in her father and sister.  That procedure had a excellent preparation, and was normal other than left-sided diverticulosis. Over the last few months she has been bothered by pelvic pain that is more toward the right side, along with some urinary and vaginal symptoms.  She has been treated for urinary infections, though it is not clear any cultures grew out positive.  She has been treated for yeast infections as well and had a gynecologic examination by Dr. Phineas Real this past November.  I reviewed his note, which mention she has had a TVH/BSO , was also having some hot flashes and he gave further treatment for possible yeast infection and did a bimanual exam. When I saw Ceniyah in 2017, she was bothered by chronic opioid-induced constipation for which I started Linzess.She had back surgery last year and now off opioids. EGD Deatra Ina 2008 for stricture - on PPI since then.  She is here to see me for follow-up of reflux.  She has chronic heartburn and has been on a PPI for probably at least 10 years.  She has lately started cutting it back to every other day, and with that we will only have some occasional brief heartburn with certain foods.  She denies dysphagia or odynophagia. ROS: Cardiovascular:  no chest pain Respiratory: no dyspnea  The patient's Past Medical, Family and Social History were reviewed and are on file in the EMR.  Objective:  Med list reviewed  Current Outpatient Medications:  .  ALPRAZolam (XANAX) 0.5 MG tablet, Take 0.5-1 tablets (0.25-0.5 mg total) by mouth 4 (four) times daily as needed for anxiety., Disp: 20 tablet, Rfl: 0 .  atenolol (TENORMIN) 25 MG tablet, Take 25 mg by mouth daily. , Disp: , Rfl:  .  Cholecalciferol (VITAMIN D PO), Take 1  tablet by mouth daily. , Disp: , Rfl:  .  estradiol (ESTRACE) 1 MG tablet, Take 1 tablet (1 mg total) daily by mouth., Disp: 90 tablet, Rfl: 3 .  hydrochlorothiazide (HYDRODIURIL) 25 MG tablet, Take 25 mg by mouth every morning., Disp: , Rfl: 0 .  omeprazole (PRILOSEC) 40 MG capsule, Take 1 capsule by mouth every day, Disp: 90 capsule, Rfl: 0 .  traMADol (ULTRAM) 50 MG tablet, Take 50 mg by mouth every 6 (six) hours as needed. for pain, Disp: , Rfl: 0 .  UNABLE TO FIND, Med Name: GAIA Herbs Dietary supplament, Disp: , Rfl:  .  VOLTAREN 1 % GEL, Apply 2 g topically daily as needed (pain). , Disp: , Rfl:   Current Facility-Administered Medications:  .  0.9 %  sodium chloride infusion, 500 mL, Intravenous, Continuous, Danis, Estill Cotta III, MD   Vital signs in last 24 hrs: Vitals:   05/01/17 1125  BP: 140/78  Pulse: 60    Physical Exam  Well-appearing woman accompanied by her husband.  HEENT: sclera anicteric, oral mucosa moist without lesions  Neck: supple, no thyromegaly, JVD or lymphadenopathy  Cardiac: RRR without murmurs, S1S2 heard, no peripheral edema  Pulm: clear to auscultation bilaterally, normal RR and effort noted  Abdomen: soft, no tenderness, with active bowel sounds. No guarding or palpable hepatosplenomegaly.  Skin; warm and dry, no jaundice or rash   @ASSESSMENTPLANBEGIN @ Assessment: Encounter Diagnosis  Name Primary?  . Gastroesophageal reflux  disease, esophagitis presence not specified Yes    Her previous opioid-induced constipation has resolved since she is off these meds. She has chronic heartburn without red flag signs.  I have encouraged her to de-escalate acid suppression with omeprazole 1 day and then Pepcid Complete 2 days later.  That way she can also do a comparison to see if the H2 blocker helps control symptoms just as well.  If so, she can change to just that and probably even take it as needed.  I refilled the omeprazole so she can use it when  needed depending upon how this de-escalation trial goes.  I do not think she needs further workup at this point.  15  Total time 15 minutes, over half spent in counseling and coordination of care.   Nelida Meuse III

## 2017-05-01 NOTE — Patient Instructions (Addendum)
Try to alternate the omeprazole with generic "pepcid complete" to control your heartburn.  If that works just as well, you can change to that medicine and take it as needed.  I have refilled the omeprazole.  Thank you for choosing [facility]  Dr Wilfrid Lund III

## 2017-05-02 DIAGNOSIS — M5126 Other intervertebral disc displacement, lumbar region: Secondary | ICD-10-CM | POA: Diagnosis not present

## 2017-05-20 DIAGNOSIS — S0231XA Fracture of orbital floor, right side, initial encounter for closed fracture: Secondary | ICD-10-CM | POA: Diagnosis not present

## 2017-05-20 DIAGNOSIS — R519 Headache, unspecified: Secondary | ICD-10-CM | POA: Insufficient documentation

## 2017-06-25 ENCOUNTER — Encounter: Payer: Self-pay | Admitting: Physician Assistant

## 2017-06-25 ENCOUNTER — Ambulatory Visit: Payer: PPO | Admitting: Physician Assistant

## 2017-06-25 VITALS — BP 126/72 | HR 50 | Ht 65.0 in | Wt 162.0 lb

## 2017-06-25 DIAGNOSIS — I10 Essential (primary) hypertension: Secondary | ICD-10-CM | POA: Diagnosis not present

## 2017-06-25 DIAGNOSIS — R001 Bradycardia, unspecified: Secondary | ICD-10-CM

## 2017-06-25 DIAGNOSIS — I2510 Atherosclerotic heart disease of native coronary artery without angina pectoris: Secondary | ICD-10-CM

## 2017-06-25 NOTE — Patient Instructions (Signed)
Medication Instructions:  Your physician recommends that you continue on your current medications as directed. Please refer to the Current Medication list given to you today.  Labwork: None   Testing/Procedures: none  Follow-Up: Your physician wants you to follow-up in: 12 months with Dr [name] ONLY. You will receive a reminder letter in the mail two months in advance. If you don't receive a letter, please call our office to schedule the follow-up appointment. Please contact office in December to schedule your next appointment.  Any Other Special Instructions Will Be Listed Below (If Applicable).  If you need a refill on your cardiac medications before your next appointment, please call your pharmacy.

## 2017-06-25 NOTE — Progress Notes (Signed)
Cardiology Office Note    Date:  06/25/2017   ID:  Amanda [name], DOB 01/10/1946, MRN [id number]  PCP:  [name], MD  Cardiologist:  Dr. [name]   Chief Complaint  Patient presents with  . Follow-up    yearly visit. Seen for Dr. [name]    History of Present Illness:  Amanda [name] is a 72 y.o. female with PMH of CAD (70% stenosis of small nondominant RCA by cath 01/2014 - medical therapy) and HTN.  Stress echocardiogram in October 2015 was abnormal.  She ultimately underwent cardiac catheterization on 02/03/2014 which showed moderate stenosis in the small nondominant RCA, normal LV function, widely patent left main, LAD and dominant left circumflex artery.  Patient was last seen by Dr. [name] in April 2017.  Patient presents today for cardiology office visit, she denies any chest pain or shortness of breath in the past year.  Her main concern is actually back pain.  It interferes with her every day activity.  Her heart rate today is 50 bpm,.  She has a history of sinus bradycardia.  However she denies any dizziness, blurred vision or feeling of passing out.  I will continue her on the current therapy.  I will continue her on the current dose of hydrochlorothiazide, last lab work obtained by [facility] in February 2019 seems to show stable renal function and electrolyte.  He does not have any lower extremity edema, orthopnea or PND.    Past Medical History:  Diagnosis Date  . Arthritis   . Back pain   . CAD (coronary artery disease)    a. 01/2014: cath showing 70% stenosis of non-dominant RCA --> medically managed.   . Cataract    bilat removed  . Diverticulosis   . GERD (gastroesophageal reflux disease)   . Hemorrhoids   . Hiatal hernia   . Hyperlipidemia   . Hypertension   . Melanoma ([place])   . Orbital fracture (HCC)    right  . Osteopenia 07/2015   T score -2.0 FRAX 10%/1.4%  . Pituitary tumor   . Stricture of esophagus   . Vitamin D deficiency     Past  Surgical History:  Procedure Laterality Date  . CERVICAL DISC SURGERY     x 2  . COLONOSCOPY    . EXCISION OF SKIN CANCER     Melanoma  . FOOT SURGERY    . HEMORRHOID BANDING     X3  . LEFT HEART CATHETERIZATION WITH CORONARY ANGIOGRAM N/A 02/03/2014   Procedure: LEFT HEART CATHETERIZATION WITH CORONARY ANGIOGRAM;  Surgeon: [name], MD;  Location: [facility] CATH LAB;  Service: Cardiovascular;  Laterality: N/A;  . LUMBAR [place] SURGERY  2018  . OOPHORECTOMY  2012   BSO  . PELVIC LAPAROSCOPY  2012   Diag Lap-BSO-lysis of adhesions  . TUBAL LIGATION    . VAGINAL HYSTERECTOMY  1993    Current Medications: Outpatient Medications Prior to Visit  Medication Sig Dispense Refill  . ALPRAZolam (XANAX) 0.5 MG tablet Take 0.5-1 tablets (0.25-0.5 mg total) by mouth 4 (four) times daily as needed for anxiety. 20 tablet 0  . atenolol (TENORMIN) 25 MG tablet Take 25 mg by mouth daily.     . Cholecalciferol (VITAMIN D PO) Take 1 tablet by mouth daily.     Marland Kitchen estradiol (ESTRACE) 1 MG tablet Take 1 tablet (1 mg total) daily by mouth. 90 tablet 3  . gabapentin (NEURONTIN) 300 MG capsule Take 1 capsule by mouth  3 (three) times daily.  3  . hydrochlorothiazide (HYDRODIURIL) 25 MG tablet Take 25 mg by mouth every morning.  0  . omeprazole (PRILOSEC) 40 MG capsule Take 1 capsule by mouth every day 90 capsule 0  . traMADol (ULTRAM) 50 MG tablet Take 50 mg by mouth every 6 (six) hours as needed. for pain  0  . UNABLE TO FIND Med Name: GAIA Herbs Dietary supplament    . VOLTAREN 1 % GEL Apply 2 g topically daily as needed (pain).      Facility-Administered Medications Prior to Visit  Medication Dose Route Frequency Provider Last Rate Last Dose  . 0.9 %  sodium chloride infusion  500 mL Intravenous Continuous Doran Stabler, MD         Allergies:   Ciprofloxacin; Lisinopril; Pneumococcal vaccine; and Pravastatin   Social History   Socioeconomic History  . Marital status: Married    Spouse name:  Not on file  . Number of children: 3  . Years of education: Not on file  . Highest education level: Not on file  Occupational History  . Occupation: Disabled  Social Needs  . Financial resource strain: Not on file  . Food insecurity:    Worry: Not on file    Inability: Not on file  . Transportation needs:    Medical: Not on file    Non-medical: Not on file  Tobacco Use  . Smoking status: Former Smoker    Last attempt to quit: 03/19/1994    Years since quitting: 23.2  . Smokeless tobacco: Never Used  Substance and Sexual Activity  . Alcohol use: No    Alcohol/week: 0.0 oz  . Drug use: No  . Sexual activity: Not Currently    Birth control/protection: Surgical    Comment: 1st intercourse 72 yo-Fewer than 5 partners,des neg  Lifestyle  . Physical activity:    Days per week: Not on file    Minutes per session: Not on file  . Stress: Not on file  Relationships  . Social connections:    Talks on phone: Not on file    Gets together: Not on file    Attends religious service: Not on file    Active member of club or organization: Not on file    Attends meetings of clubs or organizations: Not on file    Relationship status: Not on file  Other Topics Concern  . Not on file  Social History Narrative  . Not on file     Family History:  The patient's family history includes Cancer in her brother and father; Colon cancer in her brother, father, and sister; Colon polyps in her brother, father, and sister; Diabetes in her brother and sister; Heart disease in her brother, father, mother, and sister; Hypertension in her father and mother; Melanoma in her brother, brother, and sister; Stroke in her mother.   ROS:   Please see the history of present illness.    ROS All other systems reviewed and are negative.   PHYSICAL EXAM:   VS:  BP 126/72   Pulse (!) 50   Ht 5\' 5"  (1.651 m)   Wt 162 lb (73.5 kg)   BMI 26.96 kg/m    GEN: Well nourished, well developed, in no acute distress    HEENT: normal  Neck: no JVD, carotid bruits, or masses Cardiac: RRR; no murmurs, rubs, or gallops,no edema  Respiratory:  clear to auscultation bilaterally, normal work of breathing GI: soft, nontender, nondistended, +  BS MS: no deformity or atrophy  Skin: warm and dry, no rash Neuro:  Alert and Oriented x 3, Strength and sensation are intact Psych: euthymic mood, full affect  Wt Readings from Last 3 Encounters:  06/25/17 162 lb (73.5 kg)  05/01/17 161 lb 6 oz (73.2 kg)  11/27/16 165 lb (74.8 kg)      Studies/Labs Reviewed:   EKG:  EKG is ordered today.  The ekg ordered today demonstrates sinus bradycardia, heart rate 50 ppm  Recent Labs: No results found for requested labs within last 8760 hours.   Lipid Panel No results found for: CHOL, TRIG, HDL, CHOLHDL, VLDL, LDLCALC, LDLDIRECT  Additional studies/ records that were reviewed today include:    Stress echo 01/13/2014  - Stress ECG conclusions: The stress ECG was consistent with myocardial ischemia.  Impressions:  - Abnormal stress echo. Develops chest pressure with exercise associated with EKG changes consistent with ischemia. Echo images suggest probable reversible anteroapical wall motion abnormality.   Cath 02/03/2014 Procedural Findings: Hemodynamics: AO 152/65 LV 156/13  Coronary angiography: Coronary dominance: left  Left mainstem: Short segment, widely patent  Left anterior descending (LAD): Patent, large caliber vessel to the LV apex. Patent diagonals. The mid-LAD has mild diffuse nonobstructive plaque.  Left circumflex (LCx): Large, dominant vessel, no obstructive disease. The OM branches are patent.   Right coronary artery (RCA): Small, nondominant vessel. There is a 70% stenosis in the mid-vessel before the origin of a small RV marginal branch  Left ventriculography: Left ventricular systolic function is normal, LVEF is estimated at 55-65%, there is no significant mitral  regurgitation   Estimated Blood Loss: minimal  Final Conclusions:   1. Moderate stenosis of a small nondominant RCA 2. Widely patent left main, LAD, and dominant left circumflex 3. Normal LV function  Recommendations: medical therapy.  ASSESSMENT:    1. Coronary artery disease involving native coronary artery of native heart without angina pectoris   2. Essential hypertension   3. Bradycardia      PLAN:  In order of problems listed above:  1. CAD: Last cardiac catheterization was in November 2015 after abnormal stress echo, cardiac catheterization showed moderate degree 70% mid nondominant RCA lesion, otherwise widely patent artery to LAD, left main and left circumflex.  Medical therapy was recommended.  She has not had any exertional chest pain or shortness of breath in the past year  2. Sinus bradycardia: Heart rate 50 bpm, however she denies any dizziness, blurred vision or feeling of passing out.  We will continue on the current dose of atenolol for now.   3. Hypertension: Blood pressure well controlled on current medication.    Medication Adjustments/Labs and Tests Ordered: Current medicines are reviewed at length with the patient today.  Concerns regarding medicines are outlined above.  Medication changes, Labs and Tests ordered today are listed in the Patient Instructions below. Patient Instructions  Medication Instructions:  Your physician recommends that you continue on your current medications as directed. Please refer to the Current Medication list given to you today.  Labwork: None   Testing/Procedures: none  Follow-Up: Your physician wants you to follow-up in: 12 months with Dr [name] ONLY. You will receive a reminder letter in the mail two months in advance. If you don't receive a letter, please call our office to schedule the follow-up appointment. Please contact office in December to schedule your next appointment.  Any Other Special Instructions Will  Be Listed Below (If Applicable).  If you need a  refill on your cardiac medications before your next appointment, please call your pharmacy.     Hilbert Corrigan, [place]  06/25/2017 11:19 PM    [facility] [address], [place], [place]  [address] Phone: [phone]; Fax: [phone]

## 2017-07-09 DIAGNOSIS — M51369 Other intervertebral disc degeneration, lumbar region without mention of lumbar back pain or lower extremity pain: Secondary | ICD-10-CM

## 2017-07-09 DIAGNOSIS — M25511 Pain in right shoulder: Secondary | ICD-10-CM | POA: Insufficient documentation

## 2017-07-09 DIAGNOSIS — M5136 Other intervertebral disc degeneration, lumbar region: Secondary | ICD-10-CM | POA: Insufficient documentation

## 2017-07-09 DIAGNOSIS — M961 Postlaminectomy syndrome, not elsewhere classified: Secondary | ICD-10-CM

## 2017-07-09 DIAGNOSIS — M5416 Radiculopathy, lumbar region: Secondary | ICD-10-CM | POA: Diagnosis not present

## 2017-07-09 HISTORY — DX: Other intervertebral disc degeneration, lumbar region without mention of lumbar back pain or lower extremity pain: M51.369

## 2017-07-09 HISTORY — DX: Postlaminectomy syndrome, not elsewhere classified: M96.1

## 2017-07-17 DIAGNOSIS — M961 Postlaminectomy syndrome, not elsewhere classified: Secondary | ICD-10-CM | POA: Diagnosis not present

## 2017-07-18 DIAGNOSIS — H26493 Other secondary cataract, bilateral: Secondary | ICD-10-CM | POA: Diagnosis not present

## 2017-07-18 DIAGNOSIS — H1131 Conjunctival hemorrhage, right eye: Secondary | ICD-10-CM | POA: Diagnosis not present

## 2017-07-23 DIAGNOSIS — M7541 Impingement syndrome of right shoulder: Secondary | ICD-10-CM | POA: Diagnosis not present

## 2017-07-23 DIAGNOSIS — M25511 Pain in right shoulder: Secondary | ICD-10-CM | POA: Diagnosis not present

## 2017-07-27 DIAGNOSIS — M961 Postlaminectomy syndrome, not elsewhere classified: Secondary | ICD-10-CM | POA: Diagnosis not present

## 2017-09-28 DIAGNOSIS — M5416 Radiculopathy, lumbar region: Secondary | ICD-10-CM | POA: Diagnosis not present

## 2017-09-28 DIAGNOSIS — M961 Postlaminectomy syndrome, not elsewhere classified: Secondary | ICD-10-CM | POA: Diagnosis not present

## 2017-10-01 DIAGNOSIS — I10 Essential (primary) hypertension: Secondary | ICD-10-CM | POA: Diagnosis not present

## 2017-10-01 DIAGNOSIS — E559 Vitamin D deficiency, unspecified: Secondary | ICD-10-CM | POA: Diagnosis not present

## 2017-10-01 DIAGNOSIS — E781 Pure hyperglyceridemia: Secondary | ICD-10-CM | POA: Diagnosis not present

## 2017-10-04 DIAGNOSIS — Z0000 Encounter for general adult medical examination without abnormal findings: Secondary | ICD-10-CM | POA: Diagnosis not present

## 2017-11-04 DIAGNOSIS — M961 Postlaminectomy syndrome, not elsewhere classified: Secondary | ICD-10-CM | POA: Diagnosis not present

## 2017-11-28 ENCOUNTER — Encounter: Payer: PPO | Admitting: Gynecology

## 2017-12-26 DIAGNOSIS — M47816 Spondylosis without myelopathy or radiculopathy, lumbar region: Secondary | ICD-10-CM

## 2017-12-26 HISTORY — DX: Spondylosis without myelopathy or radiculopathy, lumbar region: M47.816

## 2018-01-14 DIAGNOSIS — Z87891 Personal history of nicotine dependence: Secondary | ICD-10-CM | POA: Diagnosis not present

## 2018-01-14 DIAGNOSIS — D229 Melanocytic nevi, unspecified: Secondary | ICD-10-CM | POA: Diagnosis not present

## 2018-01-14 DIAGNOSIS — Z85828 Personal history of other malignant neoplasm of skin: Secondary | ICD-10-CM | POA: Diagnosis not present

## 2018-01-14 DIAGNOSIS — C4401 Basal cell carcinoma of skin of lip: Secondary | ICD-10-CM | POA: Diagnosis not present

## 2018-01-14 DIAGNOSIS — L821 Other seborrheic keratosis: Secondary | ICD-10-CM | POA: Diagnosis not present

## 2018-01-15 ENCOUNTER — Other Ambulatory Visit: Payer: Self-pay | Admitting: Internal Medicine

## 2018-01-15 DIAGNOSIS — Z1231 Encounter for screening mammogram for malignant neoplasm of breast: Secondary | ICD-10-CM

## 2018-01-17 ENCOUNTER — Ambulatory Visit
Admission: RE | Admit: 2018-01-17 | Discharge: 2018-01-17 | Disposition: A | Discharge status: Home / Self Care | Payer: PPO | Source: Ambulatory Visit | Attending: Internal Medicine | Admitting: Internal Medicine

## 2018-01-17 DIAGNOSIS — Z1231 Encounter for screening mammogram for malignant neoplasm of breast: Secondary | ICD-10-CM | POA: Diagnosis not present

## 2018-01-23 DIAGNOSIS — C4401 Basal cell carcinoma of skin of lip: Secondary | ICD-10-CM | POA: Diagnosis not present

## 2018-02-03 DIAGNOSIS — Z4802 Encounter for removal of sutures: Secondary | ICD-10-CM | POA: Diagnosis not present

## 2018-02-03 DIAGNOSIS — C4401 Basal cell carcinoma of skin of lip: Secondary | ICD-10-CM | POA: Diagnosis not present

## 2018-02-03 DIAGNOSIS — Z483 Aftercare following surgery for neoplasm: Secondary | ICD-10-CM | POA: Diagnosis not present

## 2018-02-10 ENCOUNTER — Telehealth: Payer: Self-pay | Admitting: Cardiology

## 2018-02-10 NOTE — Telephone Encounter (Signed)
New Message:    Patient calling stating that she is not feeling good at all and would like for some one to call her.

## 2018-02-10 NOTE — Telephone Encounter (Signed)
Spoke with pt, Follow up scheduled  

## 2018-02-10 NOTE — Progress Notes (Signed)
Cardiology Office Note   Date:  02/11/2018   ID:  Amanda [name], DOB 03/08/1946, MRN [id number]  PCP:  Amanda Ruddle, MD  Cardiologist: Amanda [name]  Chief Complaint  Patient presents with  . Chest Pain  . Hypertension     History of Present Illness: Amanda [name] is a 72 y.o. female who presents for ongoing assessment and management of CAD (70% stenosis of small nondominant RCA by cath 01/2014 - medical therapy) and HTN. She has a history bradycardia but is asymptomatic.  She underwent cardiac catheterization on 02/03/2014 which showed moderate stenosis in the small nondominant RCA, normal LV function, widely patent left main, LAD and dominant left circumflex artery.  She comes today as an add on for complaints of chest pressure, left arm pain, and fatigue. She has noticed that her BP has been elevated. She states is takes much longer now to do her housework and ADL's which is unusual for her. She has had recent basal cell carcinoma removed from her left upper lip.   Past Medical History:  Diagnosis Date  . Arthritis   . Back pain   . CAD (coronary artery disease)    a. 01/2014: cath showing 70% stenosis of non-dominant RCA --> medically managed.   . Cataract    bilat removed  . Diverticulosis   . GERD (gastroesophageal reflux disease)   . Hemorrhoids   . Hiatal hernia   . Hyperlipidemia   . Hypertension   . Melanoma ([place])   . Orbital fracture    right  . Osteopenia 07/2015   T score -2.0 FRAX 10%/1.4%  . Pituitary tumor   . Stricture of esophagus   . Vitamin D deficiency     Past Surgical History:  Procedure Laterality Date  . CERVICAL DISC SURGERY     x 2  . COLONOSCOPY    . EXCISION OF SKIN CANCER     Melanoma  . FOOT SURGERY    . HEMORRHOID BANDING     X3  . LEFT HEART CATHETERIZATION WITH CORONARY ANGIOGRAM N/A 02/03/2014   Procedure: LEFT HEART CATHETERIZATION WITH CORONARY ANGIOGRAM;  Surgeon: [name], MD;  Location: [facility] CATH LAB;  Service:  Cardiovascular;  Laterality: N/A;  . LUMBAR [place] SURGERY  2018  . OOPHORECTOMY  2012   BSO  . PELVIC LAPAROSCOPY  2012   Diag Lap-BSO-lysis of adhesions  . TUBAL LIGATION    . VAGINAL HYSTERECTOMY  1993     Current Outpatient Medications  Medication Sig Dispense Refill  . ALPRAZolam (XANAX) 0.5 MG tablet Take 0.5-1 tablets (0.25-0.5 mg total) by mouth 4 (four) times daily as needed for anxiety. 20 tablet 0  . atenolol (TENORMIN) 25 MG tablet Take 25 mg by mouth daily.     . Cholecalciferol (VITAMIN D PO) Take 1 tablet by mouth daily.     Marland Kitchen estradiol (ESTRACE) 1 MG tablet Take 1 tablet (1 mg total) daily by mouth. 90 tablet 3  . gabapentin (NEURONTIN) 300 MG capsule Take 1 capsule by mouth 3 (three) times daily.  3  . hydrochlorothiazide (HYDRODIURIL) 25 MG tablet Take 25 mg by mouth every morning.  0  . omeprazole (PRILOSEC) 40 MG capsule Take 1 capsule by mouth every day 90 capsule 0  . traMADol (ULTRAM) 50 MG tablet Take 50 mg by mouth every 6 (six) hours as needed. for pain  0  . UNABLE TO FIND Med Name: GAIA Herbs Dietary supplament    . VOLTAREN 1 % GEL  Apply 2 g topically daily as needed (pain).      Current Facility-Administered Medications  Medication Dose Route Frequency Provider Last Rate Last Dose  . 0.9 %  sodium chloride infusion  500 mL Intravenous Continuous Doran Stabler, MD        Allergies:   Ciprofloxacin; Lisinopril; Pneumococcal vaccine; and Pravastatin    Social History:  The patient  reports that she quit smoking about 23 years ago. She has never used smokeless tobacco. She reports that she does not drink alcohol or use drugs.   Family History:  The patient's family history includes Cancer in her brother and father; Colon cancer in her brother, father, and sister; Colon polyps in her brother, father, and sister; Diabetes in her brother and sister; Heart disease in her brother, father, mother, and sister; Hypertension in her father and mother; Melanoma in  her brother, brother, and sister; Stroke in her mother.    ROS: All other systems are reviewed and negative. Unless otherwise mentioned in H&P    PHYSICAL EXAM: VS:  BP 140/80   Pulse 63   Ht 5\' 5"  (1.651 m)   Wt 163 lb (73.9 kg)   SpO2 99%   BMI 27.12 kg/m  , BMI Body mass index is 27.12 kg/m. GEN: Well nourished, well developed, in no acute distress HEENT: normal Neck: no JVD, carotid bruits, or masses Cardiac: RRR; split S2 heard best at the RSB, no murmurs, rubs, or gallops,no edema  Respiratory:  Clear to auscultation bilaterally, normal work of breathing GI: soft, nontender, nondistended, + BS MS: no deformity or atrophy Skin: warm and dry, no rash Neuro:  Strength and sensation are intact Psych: euthymic mood, full affect   EKG:NSR rate of 63 bpm.   Recent Labs: No results found for requested labs within last 8760 hours.    Lipid Panel No results found for: CHOL, TRIG, HDL, CHOLHDL, VLDL, LDLCALC, LDLDIRECT    Wt Readings from Last 3 Encounters:  02/11/18 163 lb (73.9 kg)  06/25/17 162 lb (73.5 kg)  05/01/17 161 lb 6 oz (73.2 kg)      Other studies Reviewed: Cath 02/03/2014 Procedural Findings: Hemodynamics: AO 152/65 LV 156/13  Coronary angiography: Coronary dominance:left  Left mainstem:Short segment, widely patent  Left anterior descending (LAD):Patent, large caliber vessel to the LV apex. Patent diagonals. The mid-LAD has mild diffuse nonobstructive plaque.  Left circumflex (LCx):Large, dominant vessel, no obstructive disease. The OM branches are patent.   Right coronary artery (RCA):Small, nondominant vessel. There is a 70% stenosis in the mid-vessel before the origin of a small RV marginal branch  Left ventriculography: Left ventricular systolic function is normal, LVEF is estimated at 55-65%, there is no significant mitral regurgitation   Estimated Blood Loss: minimal  Final Conclusions:  1. Moderate stenosis of a small  nondominant RCA 2. Widely patent left main, LAD, and dominant left circumflex 3. Normal LV function  Recommendations:medical therapy.  ASSESSMENT AND PLAN:  1. Chest discomfort: She will have [place] for re-evaluation for progressive CAD. She has known RCA disease which is being treated medically due to small caliber, however there may be new area's which may be of concern. Will completed for diagnostic/prognostic purposes.   2. Hypertension: I have checked her BP in both arms in the clinic. Left arm 130/68. Right arm 128/68. She is on atenolol and HCTZ currently. She does not always take the HCTZ if she is running errands because of excessive urination. I will check a  BMET, Mg.   3. Hypercholesterolemia: I will check lipids and LFT's (they are going to be non-fasting) as she lives in [place] and it will be difficult for her to travel in the morning for labs. She is not currently on statin therapy.    Current medicines are reviewed at length with the patient today.    Labs/ tests ordered today include: BMET, Mg and Lexiscan stress.  Phill Myron. West Pugh, ANP, AACC   02/11/2018 3:30 PM    [facility] [place] [address] Office [phone] Fax [phone]

## 2018-02-10 NOTE — Telephone Encounter (Signed)
Fu paov Amanda [name]

## 2018-02-10 NOTE — Telephone Encounter (Signed)
Per pt has not been feeling well has noted fatigue for about a month and then noc before last had numbness to left arm , as well as, noted pain from elbow up to shoulder B/p has been elevated somewhat as well as heart higher than usual. Will forward to Dr [name] for review and recommendations ./cy

## 2018-02-11 ENCOUNTER — Encounter: Payer: Self-pay | Admitting: Adult Health

## 2018-02-11 ENCOUNTER — Ambulatory Visit (INDEPENDENT_AMBULATORY_CARE_PROVIDER_SITE_OTHER): Payer: PPO | Admitting: Adult Health

## 2018-02-11 ENCOUNTER — Ambulatory Visit: Payer: PPO | Admitting: Physician Assistant

## 2018-02-11 VITALS — BP 140/80 | HR 63 | Ht 65.0 in | Wt 163.0 lb

## 2018-02-11 DIAGNOSIS — I2510 Atherosclerotic heart disease of native coronary artery without angina pectoris: Secondary | ICD-10-CM

## 2018-02-11 DIAGNOSIS — Z79899 Other long term (current) drug therapy: Secondary | ICD-10-CM | POA: Diagnosis not present

## 2018-02-11 DIAGNOSIS — R079 Chest pain, unspecified: Secondary | ICD-10-CM

## 2018-02-11 DIAGNOSIS — E7800 Pure hypercholesterolemia, unspecified: Secondary | ICD-10-CM

## 2018-02-11 DIAGNOSIS — I10 Essential (primary) hypertension: Secondary | ICD-10-CM

## 2018-02-11 NOTE — Patient Instructions (Signed)
Medication Instructions:  NO CHANGES- Your physician recommends that you continue on your current medications as directed. Please refer to the Current Medication list given to you today. If you need a refill on your cardiac medications before your next appointment, please call your pharmacy.  Labwork: BMET MAG LIPID AND LFT TODAY HERE IN OUR OFFICE AT [facility] Take the provided lab slips with you to the lab for your blood draw.   If you have labs (blood work) drawn today and your tests are completely normal, you will receive your results only by: Marland Kitchen MyChart Message (if you have MyChart) OR . A paper copy in the mail If you have any lab test that is abnormal or we need to change your treatment, we will call you to review the results.  Testing/Procedures: Your physician has requested that you have a lexiscan myoview. A cardiac stress test is a cardiological test that measures the heart's ability to respond to external stress in a controlled clinical environment. The stress response is induced by intravenous pharmacological stimulation.  Follow-Up: You will need a follow up appointment in [place].  You may see  DR Orma Flaming, DNP, AACC or one of the following Advanced Practice Providers on your designated Care Team:  Kerin Ransom, [place]  At [facility], you and your health needs are our priority.  As part of our continuing mission to provide you with exceptional heart care, we have created designated Provider Care Teams.  These Care Teams include your primary Cardiologist (physician) and Advanced Practice Providers (APPs -  Physician Assistants and Nurse Practitioners) who all work together to provide you with the care you need, when you need it.

## 2018-02-12 ENCOUNTER — Telehealth: Payer: Self-pay | Admitting: Adult Health

## 2018-02-12 ENCOUNTER — Other Ambulatory Visit: Payer: Self-pay

## 2018-02-12 LAB — MAGNESIUM: MAGNESIUM: 1.9 mg/dL (ref 1.6–2.3)

## 2018-02-12 LAB — HEPATIC FUNCTION PANEL
ALBUMIN: 3.9 g/dL (ref 3.5–4.8)
ALT: 10 IU/L (ref 0–32)
AST: 18 IU/L (ref 0–40)
Alkaline Phosphatase: 71 IU/L (ref 39–117)
BILIRUBIN TOTAL: 0.2 mg/dL (ref 0.0–1.2)
Bilirubin, Direct: 0.07 mg/dL (ref 0.00–0.40)
Total Protein: 6 g/dL (ref 6.0–8.5)

## 2018-02-12 LAB — BASIC METABOLIC PANEL
BUN / CREAT RATIO: 13 (ref 12–28)
BUN: 11 mg/dL (ref 8–27)
CALCIUM: 8.7 mg/dL (ref 8.7–10.3)
CHLORIDE: 103 mmol/L (ref 96–106)
CO2: 28 mmol/L (ref 20–29)
Creatinine, Ser: 0.83 mg/dL (ref 0.57–1.00)
GFR calc Af Amer: 83 mL/min/{1.73_m2} (ref >59)
GFR calc non Af Amer: 72 mL/min/{1.73_m2} (ref >59)
GLUCOSE: 83 mg/dL (ref 65–99)
Potassium: 4.7 mmol/L (ref 3.5–5.2)
Sodium: 142 mmol/L (ref 134–144)

## 2018-02-12 MED ORDER — MAGNESIUM CITRATE 200 MG PO TABS: 200.0000 mg | ORAL_TABLET | Freq: Every day | ORAL | 6 refills | Status: DC

## 2018-02-12 NOTE — Telephone Encounter (Signed)
New Message         Pt c/o medication issue:  1. Name of Medication: Magnesium 200 mg tab  2. How are you currently taking this medication (dosage and times per day)? 1 x a day  3. Are you having a reaction (difficulty breathing--STAT)? No   4. What is your medication issue? Pharmacy called states she don't have the Rx would like to change to something different.

## 2018-02-12 NOTE — Telephone Encounter (Signed)
Returned call to pharmacy, they will fill rx and notify pt when ready

## 2018-02-12 NOTE — Progress Notes (Signed)
Notes recorded by Lendon Colonel, NP on 02/12/2018 at 7:25 AM EST Labs are reviewed. Please add magnesium 200 mg daily. Magnesium should be 2.0 in cardiac patients, her level was 1.9. Awaiting cholesterol check. Rx sent to requested pharmacy

## 2018-02-18 ENCOUNTER — Telehealth (HOSPITAL_COMMUNITY): Payer: Self-pay

## 2018-02-18 NOTE — Telephone Encounter (Signed)
Encounter complete. 

## 2018-02-19 ENCOUNTER — Telehealth (HOSPITAL_COMMUNITY): Payer: Self-pay

## 2018-02-19 NOTE — Telephone Encounter (Signed)
Encounter complete. 

## 2018-02-20 ENCOUNTER — Ambulatory Visit (HOSPITAL_COMMUNITY)
Admission: RE | Admit: 2018-02-20 | Discharge: 2018-02-20 | Disposition: A | Discharge status: Home / Self Care | Payer: PPO | Source: Ambulatory Visit | Attending: Cardiovascular Disease | Admitting: Cardiovascular Disease

## 2018-02-20 DIAGNOSIS — I2510 Atherosclerotic heart disease of native coronary artery without angina pectoris: Secondary | ICD-10-CM | POA: Diagnosis not present

## 2018-02-20 DIAGNOSIS — R079 Chest pain, unspecified: Secondary | ICD-10-CM | POA: Diagnosis not present

## 2018-02-20 LAB — MYOCARDIAL PERFUSION IMAGING
CHL CUP RESTING HR STRESS: 48 {beats}/min
LV dias vol: 89 mL (ref 46–106)
LV sys vol: 34 mL
Peak HR: 63 {beats}/min
SDS: 0
SRS: 5
SSS: 5
TID: 1.33

## 2018-02-20 MED ORDER — TECHNETIUM TC 99M TETROFOSMIN IV KIT
31.2000 | PACK | Freq: Once | INTRAVENOUS | Status: AC | PRN
  Administered 2018-02-20: 31.2 via INTRAVENOUS
  Filled 2018-02-20: qty 32

## 2018-02-20 MED ORDER — REGADENOSON 0.4 MG/5ML IV SOLN
0.4000 mg | Freq: Once | INTRAVENOUS | Status: AC
  Administered 2018-02-20: 0.4 mg via INTRAVENOUS

## 2018-02-20 MED ORDER — TECHNETIUM TC 99M TETROFOSMIN IV KIT
10.7000 | PACK | Freq: Once | INTRAVENOUS | Status: AC | PRN
  Administered 2018-02-20: 10.7 via INTRAVENOUS
  Filled 2018-02-20: qty 11

## 2018-02-24 ENCOUNTER — Other Ambulatory Visit: Payer: Self-pay

## 2018-02-24 ENCOUNTER — Telehealth: Payer: Self-pay | Admitting: Cardiology

## 2018-02-24 DIAGNOSIS — R9439 Abnormal result of other cardiovascular function study: Secondary | ICD-10-CM

## 2018-02-24 MED ORDER — METOPROLOL TARTRATE 25 MG PO TABS: 25.0000 mg | ORAL_TABLET | Freq: Two times a day (BID) | ORAL | 0 refills | Status: DC

## 2018-02-24 NOTE — Telephone Encounter (Signed)
Spoke with pt pharmacy, aware to cancel script. The patient was given the script for cardiac CT and she is on atenolol and does not need it.

## 2018-02-24 NOTE — Telephone Encounter (Signed)
 *  STAT* If patient is at the pharmacy, call can be transferred to refill team.   1. Which medications need to be refilled? (please list name of each medication and dose if known) metoprolol tartrate (LOPRESSOR) 25 MG tablet  2. Which pharmacy/location (including street and city if local pharmacy) is medication to be sent to? [facility]  3. Do they need a 30 day or 90 day supply? Pharmacist called to verify quantity because script is written for 1 tablet

## 2018-02-24 NOTE — Progress Notes (Signed)
possible heart vessel blockage and possible prior heart attack. Please arrive at the [facility] main entrance of [facility] at    (30-45 minutes prior to test start time)  [facility] [place], [place] [id number] [phone]  Proceed to the [facility] Radiology Department (First Floor).  Please follow these instructions carefully (unless otherwise directed):  Hold all erectile dysfunction medications at least 48 hours prior to test.  On the Night Before the Test: . Be sure to Drink plenty of water. . Do not consume any caffeinated/decaffeinated beverages or chocolate 12 hours prior to your test. . Do not take any antihistamines 12 hours prior to your test. . If you take Metformin do not take 24 hours prior to test.  On the Day of the Test: . Drink plenty of water. Do not drink any water within one hour of the test. . Do not eat any food 4 hours prior to the test. . You may take your regular medications prior to the test.  . Take metoprolol 25MG  (Lopressor) two hours prior to test. THIS WAS SENT TO YOUR PREFERRED PHARMACY, [facility]. Marland Kitchen HOLD Furosemide/Hydrochlorothiazide morning of the test.   *For Clinical Staff only. Please instruct patient the following:*        -Drink plenty of water       -Hold Furosemide/hydrochlorothiazide morning of the test       -Take metoprolol (Lopressor) 2 hours prior to test (if applicable).                  -If HR is less than 55 BPM- No Beta Blocker                -IF HR is greater than 55 BPM and patient is less than or equal to 77 yrs old Lopressor 100mg  x1.                -If HR is greater than 55 BPM and patient is greater than 65 yrs old Lopressor 50 mg x1.     Do not give Lopressor to patients with an allergy to lopressor or anyone with asthma or active COPD symptoms (currently taking steroids).       After the Test: . Drink plenty of water. . After receiving IV contrast, you may experience a mild  flushed feeling. This is normal. . On occasion, you may experience a mild rash up to 24 hours after the test. This is not dangerous. If this occurs, you can take Benadryl 25 mg and increase your fluid intake. . If you experience trouble breathing, this can be serious. If it is severe call 911 IMMEDIATELY. If it is mild, please call our office. If you take any of these medications: Glipizide/Metformin, Avandament, Glucavance, please do not take 48 hours after completing test.  ----> ABOVE LETTER MAILED TO PT 02-24-2018 PM.

## 2018-02-26 ENCOUNTER — Telehealth: Payer: Self-pay | Admitting: Adult Health

## 2018-02-26 ENCOUNTER — Other Ambulatory Visit: Payer: Self-pay | Admitting: Gastroenterology

## 2018-02-26 NOTE — Telephone Encounter (Signed)
  Patient got results of stress test and wondered if she still needs to keep her about next week.

## 2018-02-28 NOTE — Telephone Encounter (Signed)
Farrel Demark, LPN No precert required and has been sent to scheduling

## 2018-02-28 NOTE — Telephone Encounter (Signed)
Notes recorded by Lendon Colonel, NP on 02/21/2018 at 4:17 PM EST Please schedule the patient for a Cardiac CT with FFR. Stress test revealed possible heart vessel blockage and possible prior heart attack. This is not fully determinate because of poor images. This test will give [place] a definite answer about blockages without having a cath.  Message sent to pre-cert re: status of pre-cert. Will await return message. Pt appt needs to be rescheduled until after the CT w/FFR

## 2018-03-03 ENCOUNTER — Ambulatory Visit: Payer: PPO | Admitting: Adult Health

## 2018-03-03 NOTE — Progress Notes (Deleted)
Cardiology Office Note   Date:  03/03/2018   ID:  Amanda [name], DOB 05-23-1945, MRN [id number]  PCP:  [name], MD  Cardiologist:  Lubertha South  No chief complaint on file.    History of Present Illness: Amanda [name] is a 72 y.o. female who presents for ongoing assessment and management of CAD  (70% stenosis of small nondominant RCA by cath 01/2014 - medical therapy) and HTN. Underwent cardiac catheterization on 02/03/2014 which showed moderate stenosis in the small nondominant RCA, normal LV function, widely patent left main, LAD and dominant left circumflex artery.   On last office visit on 02/11/2018 she complained of chest pressure and fatigue.  A NM stress test was ordered to evaluate for progression of CAD and need for cardiac cath. Also labs were drawn to check for kidney function and fasting lipids and LFTs.   NM Stress test on 02/21/2018 Myocardial perfusion is abnormal. Findings consistent with ischemia and prior myocardial infarction. This is an intermediate risk study. Overall left ventricular systolic function was normal. LV cavity size is mildly enlarged. Nuclear stress EF: 62%. The left ventricular ejection fraction is normal (55-65%). There is no prior study for comparison.  Past Medical History:  Diagnosis Date  . Arthritis   . Back pain   . CAD (coronary artery disease)    a. 01/2014: cath showing 70% stenosis of non-dominant RCA --> medically managed.   . Cataract    bilat removed  . Diverticulosis   . GERD (gastroesophageal reflux disease)   . Hemorrhoids   . Hiatal hernia   . Hyperlipidemia   . Hypertension   . Melanoma ([place])   . Orbital fracture    right  . Osteopenia 07/2015   T score -2.0 FRAX 10%/1.4%  . Pituitary tumor   . Stricture of esophagus   . Vitamin D deficiency     Past Surgical History:  Procedure Laterality Date  . CERVICAL DISC SURGERY     x 2  . COLONOSCOPY    . EXCISION OF SKIN CANCER     Melanoma  . FOOT SURGERY    .  HEMORRHOID BANDING     X3  . LEFT HEART CATHETERIZATION WITH CORONARY ANGIOGRAM N/A 02/03/2014   Procedure: LEFT HEART CATHETERIZATION WITH CORONARY ANGIOGRAM;  Surgeon: [name], MD;  Location: [facility] CATH LAB;  Service: Cardiovascular;  Laterality: N/A;  . LUMBAR [place] SURGERY  2018  . OOPHORECTOMY  2012   BSO  . PELVIC LAPAROSCOPY  2012   Diag Lap-BSO-lysis of adhesions  . TUBAL LIGATION    . VAGINAL HYSTERECTOMY  1993     Current Outpatient Medications  Medication Sig Dispense Refill  . ALPRAZolam (XANAX) 0.5 MG tablet Take 0.5-1 tablets (0.25-0.5 mg total) by mouth 4 (four) times daily as needed for anxiety. 20 tablet 0  . atenolol (TENORMIN) 25 MG tablet Take 25 mg by mouth daily.     . Cholecalciferol (VITAMIN D PO) Take 1 tablet by mouth daily.     Marland Kitchen estradiol (ESTRACE) 1 MG tablet Take 1 tablet (1 mg total) daily by mouth. 90 tablet 3  . gabapentin (NEURONTIN) 300 MG capsule Take 1 capsule by mouth 3 (three) times daily.  3  . hydrochlorothiazide (HYDRODIURIL) 25 MG tablet Take 25 mg by mouth every morning.  0  . Magnesium Citrate 200 MG TABS Take 200 mg by mouth daily. 30 tablet 6  . metoprolol tartrate (LOPRESSOR) 25 MG tablet Take 1 tablet (25 mg total)  by mouth 2 (two) times daily. 1 tablet 0  . omeprazole (PRILOSEC) 40 MG capsule Take 1 capsule by mouth every day 90 capsule 0  . traMADol (ULTRAM) 50 MG tablet Take 50 mg by mouth every 6 (six) hours as needed. for pain  0  . UNABLE TO FIND Med Name: GAIA Herbs Dietary supplament    . VOLTAREN 1 % GEL Apply 2 g topically daily as needed (pain).      Current Facility-Administered Medications  Medication Dose Route Frequency Provider Last Rate Last Dose  . 0.9 %  sodium chloride infusion  500 mL Intravenous Continuous Doran Stabler, MD        Allergies:   Ciprofloxacin; Lisinopril; Pneumococcal vaccine; and Pravastatin    Social History:  The patient  reports that she quit smoking about 23 years ago. She has  never used smokeless tobacco. She reports that she does not drink alcohol or use drugs.   Family History:  The patient's family history includes Cancer in her brother and father; Colon cancer in her brother, father, and sister; Colon polyps in her brother, father, and sister; Diabetes in her brother and sister; Heart disease in her brother, father, mother, and sister; Hypertension in her father and mother; Melanoma in her brother, brother, and sister; Stroke in her mother.    ROS: All other systems are reviewed and negative. Unless otherwise mentioned in H&P    PHYSICAL EXAM: VS:  There were no vitals taken for this visit. , BMI There is no height or weight on file to calculate BMI. GEN: Well nourished, well developed, in no acute distress HEENT: normal Neck: no JVD, carotid bruits, or masses Cardiac: ***RRR; no murmurs, rubs, or gallops,no edema  Respiratory:  Clear to auscultation bilaterally, normal work of breathing GI: soft, nontender, nondistended, + BS MS: no deformity or atrophy Skin: warm and dry, no rash Neuro:  Strength and sensation are intact Psych: euthymic mood, full affect   EKG:  EKG {ACTION; IS/IS [id number]} ordered today. The ekg ordered today demonstrates ***   Recent Labs: 02/11/2018: ALT 10; BUN 11; Creatinine, Ser 0.83; Magnesium 1.9; Potassium 4.7; Sodium 142    Lipid Panel No results found for: CHOL, TRIG, HDL, CHOLHDL, VLDL, LDLCALC, LDLDIRECT    Wt Readings from Last 3 Encounters:  02/20/18 163 lb (73.9 kg)  02/11/18 163 lb (73.9 kg)  06/25/17 162 lb (73.5 kg)      Other studies Reviewed: Additional studies/ records that were reviewed today include: ***. Review of the above records demonstrates: ***   ASSESSMENT AND PLAN:  1.  ***   Current medicines are reviewed at length with the patient today.    Labs/ tests ordered today include: *** Phill Myron. [place] Pugh, ANP, AACC   03/03/2018 7:40 AM    [address] Office [phone] Fax [phone]

## 2018-03-04 ENCOUNTER — Ambulatory Visit: Payer: PPO | Admitting: Adult Health

## 2018-03-04 DIAGNOSIS — M961 Postlaminectomy syndrome, not elsewhere classified: Secondary | ICD-10-CM | POA: Diagnosis not present

## 2018-03-04 DIAGNOSIS — M47896 Other spondylosis, lumbar region: Secondary | ICD-10-CM | POA: Diagnosis not present

## 2018-03-04 DIAGNOSIS — M5136 Other intervertebral disc degeneration, lumbar region: Secondary | ICD-10-CM | POA: Diagnosis not present

## 2018-03-04 DIAGNOSIS — M5416 Radiculopathy, lumbar region: Secondary | ICD-10-CM | POA: Diagnosis not present

## 2018-03-05 DIAGNOSIS — Z483 Aftercare following surgery for neoplasm: Secondary | ICD-10-CM | POA: Diagnosis not present

## 2018-03-05 DIAGNOSIS — C4401 Basal cell carcinoma of skin of lip: Secondary | ICD-10-CM | POA: Diagnosis not present

## 2018-03-25 ENCOUNTER — Telehealth: Payer: Self-pay | Admitting: Adult Health

## 2018-03-25 ENCOUNTER — Telehealth (HOSPITAL_COMMUNITY): Payer: Self-pay | Admitting: Emergency Medicine

## 2018-03-25 NOTE — Telephone Encounter (Signed)
Reaching out to patient to offer assistance regarding upcoming cardiac imaging study; unable to reach patient but name and call back number provided in voicemail for further questions should they arise [name] RN Navigator Cardiac Imaging [phone] 

## 2018-03-25 NOTE — Telephone Encounter (Signed)
New Message    *STAT* If patient is at the pharmacy, call can be transferred to refill team.   1. Which medications need to be refilled? (please list name of each medication and dose if known) metoprolol tartrate (LOPRESSOR) 25 MG tablet  2. Which pharmacy/location (including street and city if local pharmacy) is medication to be sent to? [place], [place], [place]  3. Do they need a 30 day or 90 day supply? 30   Patient states rx was not sent

## 2018-03-26 ENCOUNTER — Ambulatory Visit (HOSPITAL_COMMUNITY)
Admission: RE | Admit: 2018-03-26 | Discharge: 2018-03-26 | Disposition: A | Discharge status: Home / Self Care | Payer: PPO | Source: Ambulatory Visit | Attending: Adult Health | Admitting: Adult Health

## 2018-03-26 DIAGNOSIS — R9439 Abnormal result of other cardiovascular function study: Secondary | ICD-10-CM

## 2018-03-26 DIAGNOSIS — I700 Atherosclerosis of aorta: Secondary | ICD-10-CM | POA: Insufficient documentation

## 2018-03-26 DIAGNOSIS — I77810 Thoracic aortic ectasia: Secondary | ICD-10-CM | POA: Insufficient documentation

## 2018-03-26 DIAGNOSIS — K449 Diaphragmatic hernia without obstruction or gangrene: Secondary | ICD-10-CM | POA: Insufficient documentation

## 2018-03-26 DIAGNOSIS — I2510 Atherosclerotic heart disease of native coronary artery without angina pectoris: Secondary | ICD-10-CM

## 2018-03-26 MED ORDER — NITROGLYCERIN 0.4 MG SL SUBL
0.8000 mg | SUBLINGUAL_TABLET | Freq: Once | SUBLINGUAL | Status: AC
  Administered 2018-03-26: 0.8 mg via SUBLINGUAL
  Filled 2018-03-26: qty 25

## 2018-03-26 MED ORDER — NITROGLYCERIN 0.4 MG SL SUBL
SUBLINGUAL_TABLET | SUBLINGUAL | Status: AC
  Filled 2018-03-26: qty 2

## 2018-03-26 MED ORDER — IOPAMIDOL (ISOVUE-370) INJECTION 76%
100.0000 mL | Freq: Once | INTRAVENOUS | Status: AC | PRN
  Administered 2018-03-26: 100 mL via INTRAVENOUS

## 2018-03-26 NOTE — Telephone Encounter (Signed)
Refill refused only one dose prior to cardiac ct

## 2018-03-26 NOTE — Progress Notes (Signed)
Patient ambulatory out of department with steady gait. Denis any complaints.

## 2018-03-26 NOTE — Progress Notes (Signed)
Ct complete. Patient denies any complaints. Offered patient snack and soda, patient denies.

## 2018-04-04 DIAGNOSIS — I10 Essential (primary) hypertension: Secondary | ICD-10-CM | POA: Diagnosis not present

## 2018-04-04 DIAGNOSIS — F419 Anxiety disorder, unspecified: Secondary | ICD-10-CM | POA: Diagnosis not present

## 2018-04-29 DIAGNOSIS — M7541 Impingement syndrome of right shoulder: Secondary | ICD-10-CM | POA: Diagnosis not present

## 2018-04-29 DIAGNOSIS — M25511 Pain in right shoulder: Secondary | ICD-10-CM | POA: Diagnosis not present

## 2018-05-01 DIAGNOSIS — Z483 Aftercare following surgery for neoplasm: Secondary | ICD-10-CM | POA: Diagnosis not present

## 2018-05-01 DIAGNOSIS — C4401 Basal cell carcinoma of skin of lip: Secondary | ICD-10-CM | POA: Diagnosis not present

## 2018-05-15 DIAGNOSIS — M961 Postlaminectomy syndrome, not elsewhere classified: Secondary | ICD-10-CM | POA: Diagnosis not present

## 2018-05-15 DIAGNOSIS — M5417 Radiculopathy, lumbosacral region: Secondary | ICD-10-CM | POA: Diagnosis not present

## 2018-05-24 DIAGNOSIS — M961 Postlaminectomy syndrome, not elsewhere classified: Secondary | ICD-10-CM | POA: Diagnosis not present

## 2018-05-24 DIAGNOSIS — M5416 Radiculopathy, lumbar region: Secondary | ICD-10-CM | POA: Diagnosis not present

## 2018-07-01 ENCOUNTER — Ambulatory Visit: Payer: PPO | Admitting: Cardiology

## 2018-07-01 DIAGNOSIS — I480 Paroxysmal atrial fibrillation: Secondary | ICD-10-CM | POA: Diagnosis not present

## 2018-07-01 DIAGNOSIS — M5416 Radiculopathy, lumbar region: Secondary | ICD-10-CM | POA: Diagnosis not present

## 2018-07-01 DIAGNOSIS — M5136 Other intervertebral disc degeneration, lumbar region: Secondary | ICD-10-CM | POA: Diagnosis not present

## 2018-07-01 DIAGNOSIS — M961 Postlaminectomy syndrome, not elsewhere classified: Secondary | ICD-10-CM | POA: Diagnosis not present

## 2018-07-01 DIAGNOSIS — M47816 Spondylosis without myelopathy or radiculopathy, lumbar region: Secondary | ICD-10-CM | POA: Diagnosis not present

## 2018-07-10 DIAGNOSIS — M961 Postlaminectomy syndrome, not elsewhere classified: Secondary | ICD-10-CM | POA: Diagnosis not present

## 2018-08-04 DIAGNOSIS — Z85828 Personal history of other malignant neoplasm of skin: Secondary | ICD-10-CM | POA: Diagnosis not present

## 2018-08-04 DIAGNOSIS — L570 Actinic keratosis: Secondary | ICD-10-CM | POA: Diagnosis not present

## 2018-08-04 DIAGNOSIS — L814 Other melanin hyperpigmentation: Secondary | ICD-10-CM | POA: Diagnosis not present

## 2018-08-04 DIAGNOSIS — D489 Neoplasm of uncertain behavior, unspecified: Secondary | ICD-10-CM | POA: Diagnosis not present

## 2018-08-04 DIAGNOSIS — C44311 Basal cell carcinoma of skin of nose: Secondary | ICD-10-CM | POA: Diagnosis not present

## 2018-08-04 DIAGNOSIS — L821 Other seborrheic keratosis: Secondary | ICD-10-CM | POA: Diagnosis not present

## 2018-08-04 DIAGNOSIS — C44612 Basal cell carcinoma of skin of right upper limb, including shoulder: Secondary | ICD-10-CM | POA: Diagnosis not present

## 2018-08-04 DIAGNOSIS — D229 Melanocytic nevi, unspecified: Secondary | ICD-10-CM | POA: Diagnosis not present

## 2018-08-26 DIAGNOSIS — M542 Cervicalgia: Secondary | ICD-10-CM | POA: Diagnosis not present

## 2018-08-26 DIAGNOSIS — G894 Chronic pain syndrome: Secondary | ICD-10-CM | POA: Diagnosis not present

## 2018-08-26 DIAGNOSIS — M5416 Radiculopathy, lumbar region: Secondary | ICD-10-CM | POA: Diagnosis not present

## 2018-08-26 DIAGNOSIS — M5412 Radiculopathy, cervical region: Secondary | ICD-10-CM | POA: Diagnosis not present

## 2018-08-26 DIAGNOSIS — M5136 Other intervertebral disc degeneration, lumbar region: Secondary | ICD-10-CM | POA: Diagnosis not present

## 2018-08-26 DIAGNOSIS — M961 Postlaminectomy syndrome, not elsewhere classified: Secondary | ICD-10-CM | POA: Diagnosis not present

## 2018-08-28 DIAGNOSIS — C44612 Basal cell carcinoma of skin of right upper limb, including shoulder: Secondary | ICD-10-CM | POA: Diagnosis not present

## 2018-09-09 DIAGNOSIS — M5030 Other cervical disc degeneration, unspecified cervical region: Secondary | ICD-10-CM | POA: Diagnosis not present

## 2018-09-09 DIAGNOSIS — M961 Postlaminectomy syndrome, not elsewhere classified: Secondary | ICD-10-CM | POA: Diagnosis not present

## 2018-09-09 DIAGNOSIS — M5136 Other intervertebral disc degeneration, lumbar region: Secondary | ICD-10-CM | POA: Diagnosis not present

## 2018-09-12 DIAGNOSIS — C44311 Basal cell carcinoma of skin of nose: Secondary | ICD-10-CM | POA: Insufficient documentation

## 2018-09-12 HISTORY — DX: Basal cell carcinoma of skin of nose: C44.311

## 2018-09-23 DIAGNOSIS — Z483 Aftercare following surgery for neoplasm: Secondary | ICD-10-CM | POA: Diagnosis not present

## 2018-09-23 DIAGNOSIS — C44311 Basal cell carcinoma of skin of nose: Secondary | ICD-10-CM | POA: Diagnosis not present

## 2018-09-23 DIAGNOSIS — Z4802 Encounter for removal of sutures: Secondary | ICD-10-CM | POA: Diagnosis not present

## 2018-10-22 NOTE — Progress Notes (Signed)
HPI: FU CP and dyspnea. See previous notes for details. Stress echocardiogram October 2015 showed chest pain, electocardiographic changes and possible anteroapical wall motion abnormality. Cardiac catheterization November 2015 showed a 70% small nondominant right coronary artery and an ejection fraction of 55-65%. Medical therapy recommended. Nuclear study December 2019 showed ejection fraction 62% poor quality study question small apical infarct and mild inferobasilar ischemia.  Coronary CTA January 2020 showed calcium score 53 and mild stenosis proximal LAD.  Treated medically.  Since last seen, the patient has dyspnea with more extreme activities but not with routine activities. It is relieved with rest. It is not associated with chest pain. There is no orthopnea, PND or pedal edema. There is no syncope or palpitations. There is no exertional chest pain.   Current Outpatient Medications  Medication Sig Dispense Refill  . ALPRAZolam (XANAX) 0.5 MG tablet Take 0.5-1 tablets (0.25-0.5 mg total) by mouth 4 (four) times daily as needed for anxiety. 20 tablet 0  . atenolol (TENORMIN) 25 MG tablet Take 25 mg by mouth daily.     . Cholecalciferol (VITAMIN D PO) Take 1 tablet by mouth daily.     Marland Kitchen estradiol (ESTRACE) 1 MG tablet Take 1 tablet (1 mg total) daily by mouth. 90 tablet 3  . gabapentin (NEURONTIN) 300 MG capsule Take 1 capsule by mouth 3 (three) times daily.  3  . hydrochlorothiazide (HYDRODIURIL) 25 MG tablet Take 25 mg by mouth every morning.  0  . Magnesium Citrate 200 MG TABS Take 200 mg by mouth daily. (Patient not taking: Reported on 03/26/2018) 30 tablet 6  . metoprolol tartrate (LOPRESSOR) 25 MG tablet Take 1 tablet (25 mg total) by mouth 2 (two) times daily. (Patient not taking: Reported on 10/23/2018) 1 tablet 0  . omeprazole (PRILOSEC) 40 MG capsule Take 1 capsule by mouth every day 90 capsule 0  . traMADol (ULTRAM) 50 MG tablet Take 50 mg by mouth every 6 (six) hours as needed.  for pain  0  . UNABLE TO FIND Med Name: GAIA Herbs Dietary supplament    . VOLTAREN 1 % GEL Apply 2 g topically daily as needed (pain).      Current Facility-Administered Medications  Medication Dose Route Frequency Provider Last Rate Last Dose  . 0.9 %  sodium chloride infusion  500 mL Intravenous Continuous Doran Stabler, MD         Past Medical History:  Diagnosis Date  . Arthritis   . Back pain   . CAD (coronary artery disease)    a. 01/2014: cath showing 70% stenosis of non-dominant RCA --> medically managed.   . Cataract    bilat removed  . Diverticulosis   . GERD (gastroesophageal reflux disease)   . Hemorrhoids   . Hiatal hernia   . Hyperlipidemia   . Hypertension   . Melanoma ([place])   . Orbital fracture    right  . Osteopenia 07/2015   T score -2.0 FRAX 10%/1.4%  . Pituitary tumor   . Stricture of esophagus   . Vitamin D deficiency     Past Surgical History:  Procedure Laterality Date  . CERVICAL DISC SURGERY     x 2  . COLONOSCOPY    . EXCISION OF SKIN CANCER     Melanoma  . FOOT SURGERY    . HEMORRHOID BANDING     X3  . LEFT HEART CATHETERIZATION WITH CORONARY ANGIOGRAM N/A 02/03/2014   Procedure: LEFT HEART CATHETERIZATION WITH  CORONARY ANGIOGRAM;  Surgeon: [name], MD;  Location: [facility] CATH LAB;  Service: Cardiovascular;  Laterality: N/A;  . LUMBAR [place] SURGERY  2018  . OOPHORECTOMY  2012   BSO  . PELVIC LAPAROSCOPY  2012   Diag Lap-BSO-lysis of adhesions  . TUBAL LIGATION    . VAGINAL HYSTERECTOMY  1993    Social History   Socioeconomic History  . Marital status: Married    Spouse name: Not on file  . Number of children: 3  . Years of education: Not on file  . Highest education level: Not on file  Occupational History  . Occupation: Disabled  Social Needs  . Financial resource strain: Not on file  . Food insecurity    Worry: Not on file    Inability: Not on file  . Transportation needs    Medical: Not on file     Non-medical: Not on file  Tobacco Use  . Smoking status: Former Smoker    Quit date: 03/19/1994    Years since quitting: 24.6  . Smokeless tobacco: Never Used  Substance and Sexual Activity  . Alcohol use: No    Alcohol/week: 0.0 standard drinks  . Drug use: No  . Sexual activity: Not Currently    Birth control/protection: Surgical    Comment: 1st intercourse 73 yo-Fewer than 5 partners,des neg  Lifestyle  . Physical activity    Days per week: Not on file    Minutes per session: Not on file  . Stress: Not on file  Relationships  . Social Herbalist on phone: Not on file    Gets together: Not on file    Attends religious service: Not on file    Active member of club or organization: Not on file    Attends meetings of clubs or organizations: Not on file    Relationship status: Not on file  . Intimate partner violence    Fear of current or ex partner: Not on file    Emotionally abused: Not on file    Physically abused: Not on file    Forced sexual activity: Not on file  Other Topics Concern  . Not on file  Social History Narrative  . Not on file    Family History  Problem Relation Age of Onset  . Heart disease Mother   . Hypertension Mother   . Stroke Mother   . Colon polyps Father   . Heart disease Father        CHF  . Hypertension Father   . Cancer Father        COLON  . Colon cancer Father   . Colon cancer Sister   . Colon polyps Sister   . Heart disease Sister   . Diabetes Sister   . Melanoma Sister   . Colon polyps Brother   . Diabetes Brother   . Heart disease Brother   . Cancer Brother        COLON  . Melanoma Brother   . Colon cancer Brother   . Melanoma Brother   . Esophageal cancer Neg Hx   . Rectal cancer Neg Hx   . Stomach cancer Neg Hx     ROS: no fevers or chills, productive cough, hemoptysis, dysphasia, odynophagia, melena, hematochezia, dysuria, hematuria, rash, seizure activity, orthopnea, PND, pedal edema, claudication.  Remaining systems are negative.  Physical Exam: Well-developed well-nourished in no acute distress.  Skin is warm and dry.  HEENT is normal.  Neck is  supple.  Chest is clear to auscultation with normal expansion.  Cardiovascular exam is regular rate and rhythm.  Abdominal exam nontender or distended. No masses palpated. Extremities show no edema. neuro grossly intact  ECG-sinus bradycardia, nonspecific ST changes.  Personally reviewed  A/P  1 mild coronary artery disease-we will add aspirin 81 mg daily.  She is intolerant to statins.  2 hypertension-patient's blood pressure is controlled.  Continue present medications and follow.  3 chest pain-no recurrent symptoms.  Cardiac CTA as outlined.  Plan medical therapy.  4 hyperlipidemia-given mild coronary disease will will check lipids.  If LDL greater than 70 will consider Zetia Repatha.  She is intolerant to statins.  Kirk Ruths, MD

## 2018-10-23 ENCOUNTER — Ambulatory Visit (INDEPENDENT_AMBULATORY_CARE_PROVIDER_SITE_OTHER): Payer: PPO | Admitting: Cardiology

## 2018-10-23 ENCOUNTER — Encounter: Payer: Self-pay | Admitting: Cardiology

## 2018-10-23 ENCOUNTER — Other Ambulatory Visit: Payer: Self-pay

## 2018-10-23 VITALS — BP 132/80 | HR 49 | Temp 98.2°F | Ht 64.0 in | Wt 159.4 lb

## 2018-10-23 DIAGNOSIS — I2510 Atherosclerotic heart disease of native coronary artery without angina pectoris: Secondary | ICD-10-CM

## 2018-10-23 DIAGNOSIS — I10 Essential (primary) hypertension: Secondary | ICD-10-CM | POA: Diagnosis not present

## 2018-10-23 DIAGNOSIS — R079 Chest pain, unspecified: Secondary | ICD-10-CM

## 2018-10-23 DIAGNOSIS — E7800 Pure hypercholesterolemia, unspecified: Secondary | ICD-10-CM | POA: Diagnosis not present

## 2018-10-23 DIAGNOSIS — Z483 Aftercare following surgery for neoplasm: Secondary | ICD-10-CM | POA: Diagnosis not present

## 2018-10-23 DIAGNOSIS — C44311 Basal cell carcinoma of skin of nose: Secondary | ICD-10-CM | POA: Diagnosis not present

## 2018-10-23 LAB — LIPID PANEL
Chol/HDL Ratio: 4.3 ratio (ref 0.0–4.4)
Cholesterol, Total: 184 mg/dL (ref 100–199)
HDL: 43 mg/dL (ref >39)
LDL Calculated: 109 mg/dL — ABNORMAL HIGH (ref 0–99)
Triglycerides: 158 mg/dL — ABNORMAL HIGH (ref 0–149)
VLDL Cholesterol Cal: 32 mg/dL (ref 5–40)

## 2018-10-23 MED ORDER — ASPIRIN EC 81 MG PO TBEC: 81.0000 mg | DELAYED_RELEASE_TABLET | Freq: Every day | ORAL | 3 refills | Status: DC

## 2018-10-23 NOTE — Addendum Note (Signed)
Addended by: Diana Eves on: 10/23/2018 08:56 AM   Modules accepted: Orders

## 2018-10-23 NOTE — Patient Instructions (Signed)
Medication Instructions:  START ASPIRIN 81 MG ONCE DAILY  If you need a refill on your cardiac medications before your next appointment, please call your pharmacy.   Lab work: Your physician recommends that you HAVE LAB WORK TODAY If you have labs (blood work) drawn today and your tests are completely normal, you will receive your results only by: Marland Kitchen MyChart Message (if you have MyChart) OR . A paper copy in the mail If you have any lab test that is abnormal or we need to change your treatment, we will call you to review the results.  Follow-Up: At [facility], you and your health needs are our priority.  As part of our continuing mission to provide you with exceptional heart care, we have created designated Provider Care Teams.  These Care Teams include your primary Cardiologist (physician) and Advanced Practice Providers (APPs -  Physician Assistants and Nurse Practitioners) who all work together to provide you with the care you need, when you need it. You will need a follow up appointment in 12 months.  Please call our office 2 months in advance to schedule this appointment.  You may see Kirk Ruths MD or one of the following Advanced Practice Providers on your designated Care Team:   Kerin Ransom, PA-C Roby Lofts, [place] . Sande Rives, PA-C

## 2018-10-24 ENCOUNTER — Telehealth: Payer: Self-pay | Admitting: *Deleted

## 2018-10-24 DIAGNOSIS — E7800 Pure hypercholesterolemia, unspecified: Secondary | ICD-10-CM

## 2018-10-24 MED ORDER — EZETIMIBE 10 MG PO TABS: 10.0000 mg | ORAL_TABLET | Freq: Every day | ORAL | 3 refills | Status: DC

## 2018-10-24 NOTE — Telephone Encounter (Addendum)
-----   Message from Lelon Perla, MD sent at 10/23/2018  5:01 PM EDT ----- Add zetia 10 mg daily; lipids and liver 8 weeks Real with pt, Aware of dr Jacalyn Lefevre recommendations. New script sent to the pharmacy and Lab orders mailed to the pt

## 2018-10-31 DIAGNOSIS — Z79899 Other long term (current) drug therapy: Secondary | ICD-10-CM | POA: Diagnosis not present

## 2018-10-31 DIAGNOSIS — F419 Anxiety disorder, unspecified: Secondary | ICD-10-CM | POA: Diagnosis not present

## 2018-10-31 DIAGNOSIS — R232 Flushing: Secondary | ICD-10-CM | POA: Diagnosis not present

## 2018-10-31 DIAGNOSIS — G8929 Other chronic pain: Secondary | ICD-10-CM | POA: Diagnosis not present

## 2018-10-31 DIAGNOSIS — Z5181 Encounter for therapeutic drug level monitoring: Secondary | ICD-10-CM | POA: Diagnosis not present

## 2018-10-31 DIAGNOSIS — I10 Essential (primary) hypertension: Secondary | ICD-10-CM | POA: Diagnosis not present

## 2018-10-31 DIAGNOSIS — M545 Low back pain: Secondary | ICD-10-CM | POA: Diagnosis not present

## 2018-11-03 DIAGNOSIS — H1013 Acute atopic conjunctivitis, bilateral: Secondary | ICD-10-CM | POA: Diagnosis not present

## 2018-11-03 DIAGNOSIS — H26493 Other secondary cataract, bilateral: Secondary | ICD-10-CM | POA: Diagnosis not present

## 2018-11-04 DIAGNOSIS — I10 Essential (primary) hypertension: Secondary | ICD-10-CM | POA: Diagnosis not present

## 2018-11-18 DIAGNOSIS — I10 Essential (primary) hypertension: Secondary | ICD-10-CM | POA: Diagnosis not present

## 2018-11-18 DIAGNOSIS — F419 Anxiety disorder, unspecified: Secondary | ICD-10-CM | POA: Diagnosis not present

## 2018-12-24 ENCOUNTER — Encounter: Payer: Self-pay | Admitting: Gynecology

## 2018-12-29 ENCOUNTER — Telehealth: Payer: Self-pay | Admitting: Family Medicine

## 2018-12-29 NOTE — Telephone Encounter (Signed)

## 2018-12-30 ENCOUNTER — Encounter: Payer: Self-pay | Admitting: Family Medicine

## 2018-12-30 ENCOUNTER — Other Ambulatory Visit: Payer: Self-pay

## 2018-12-30 ENCOUNTER — Ambulatory Visit (INDEPENDENT_AMBULATORY_CARE_PROVIDER_SITE_OTHER): Payer: PPO | Admitting: Family Medicine

## 2018-12-30 VITALS — BP 130/70 | HR 60 | Ht 64.0 in | Wt 152.0 lb

## 2018-12-30 DIAGNOSIS — G629 Polyneuropathy, unspecified: Secondary | ICD-10-CM

## 2018-12-30 DIAGNOSIS — F99 Mental disorder, not otherwise specified: Secondary | ICD-10-CM | POA: Diagnosis not present

## 2018-12-30 DIAGNOSIS — I10 Essential (primary) hypertension: Secondary | ICD-10-CM

## 2018-12-30 DIAGNOSIS — F418 Other specified anxiety disorders: Secondary | ICD-10-CM

## 2018-12-30 DIAGNOSIS — Z23 Encounter for immunization: Secondary | ICD-10-CM | POA: Insufficient documentation

## 2018-12-30 DIAGNOSIS — F5105 Insomnia due to other mental disorder: Secondary | ICD-10-CM | POA: Insufficient documentation

## 2018-12-30 HISTORY — DX: Insomnia due to other mental disorder: F51.05

## 2018-12-30 MED ORDER — LOSARTAN POTASSIUM 25 MG PO TABS: 25.0000 mg | ORAL_TABLET | Freq: Every day | ORAL | 1 refills | Status: DC

## 2018-12-30 MED ORDER — MIRTAZAPINE 15 MG PO TABS: 15.0000 mg | ORAL_TABLET | Freq: Every day | ORAL | 1 refills | Status: DC

## 2018-12-30 NOTE — Progress Notes (Addendum)
Established Patient Office Visit  Subjective:  Patient ID: Amanda [name], female    DOB: October 30, 1945  Age: 73 y.o. MRN: [id number]  CC:  Chief Complaint  Patient presents with  . Establish Care    HPI Amanda [name] presents for establishment of care follow-up of her hypertension and depression.  Patient suffers from chronic lower back pain.  She is currently seeing Dr. Dossie Der for this issue is taking tramadol for this problem.  She also suffers from anxiety with some depression.  She recently tried Paxil but did not tolerate that medication because she felt that it made her "crazy".  Her last provider reluctantly filled Xanax prescription for her to be taken at most twice daily.  Patient admitted to me that her biggest problem is at night after the lights go off.  She she becomes anxious at that time and has difficulty with sleep.  Patient has a history of mildly elevated cholesterol and was most recently started on Zetia.  She said that she was unable to tolerate this medicine because it caused cramping in her feet.  She has been unable to take pravastatin as well.  Chart review shows that her LDL cholesterol is mildly elevated.  She does have a history of coronary artery disease.  She is occasionally feels a burning sensation in the balls of both feet.  Past Medical History:  Diagnosis Date  . Arthritis   . Back pain   . CAD (coronary artery disease)    a. 01/2014: cath showing 70% stenosis of non-dominant RCA --> medically managed.   . Cataract    bilat removed  . Diverticulosis   . GERD (gastroesophageal reflux disease)   . Hemorrhoids   . Hiatal hernia   . Hyperlipidemia   . Hypertension   . Melanoma ([place])   . Orbital fracture (HCC)    right  . Osteopenia 07/2015   T score -2.0 FRAX 10%/1.4%  . Pituitary tumor   . Stricture of esophagus   . Vitamin D deficiency     Past Surgical History:  Procedure Laterality Date  . CERVICAL DISC SURGERY     x 2  . COLONOSCOPY     . EXCISION OF SKIN CANCER     Melanoma  . FOOT SURGERY    . HEMORRHOID BANDING     X3  . LEFT HEART CATHETERIZATION WITH CORONARY ANGIOGRAM N/A 02/03/2014   Procedure: LEFT HEART CATHETERIZATION WITH CORONARY ANGIOGRAM;  Surgeon: [name], MD;  Location: [facility] CATH LAB;  Service: Cardiovascular;  Laterality: N/A;  . LUMBAR [place] SURGERY  2018  . OOPHORECTOMY  2012   BSO  . PELVIC LAPAROSCOPY  2012   Diag Lap-BSO-lysis of adhesions  . TUBAL LIGATION    . VAGINAL HYSTERECTOMY  1993    Family History  Problem Relation Age of Onset  . Heart disease Mother   . Hypertension Mother   . Stroke Mother   . Colon polyps Father   . Heart disease Father        CHF  . Hypertension Father   . Cancer Father        COLON  . Colon cancer Father   . Colon cancer Sister   . Colon polyps Sister   . Heart disease Sister   . Diabetes Sister   . Melanoma Sister   . Colon polyps Brother   . Diabetes Brother   . Heart disease Brother   . Cancer Brother  COLON  . Melanoma Brother   . Colon cancer Brother   . Melanoma Brother   . Esophageal cancer Neg Hx   . Rectal cancer Neg Hx   . Stomach cancer Neg Hx     Social History   Socioeconomic History  . Marital status: Married    Spouse name: Not on file  . Number of children: 3  . Years of education: Not on file  . Highest education level: Not on file  Occupational History  . Occupation: Disabled  Social Needs  . Financial resource strain: Not on file  . Food insecurity    Worry: Not on file    Inability: Not on file  . Transportation needs    Medical: Not on file    Non-medical: Not on file  Tobacco Use  . Smoking status: Former Smoker    Quit date: 03/19/1994    Years since quitting: 24.8  . Smokeless tobacco: Never Used  Substance and Sexual Activity  . Alcohol use: No    Alcohol/week: 0.0 standard drinks  . Drug use: No  . Sexual activity: Not Currently    Birth control/protection: Surgical    Comment: 1st  intercourse 73 yo-Fewer than 5 partners,des neg  Lifestyle  . Physical activity    Days per week: Not on file    Minutes per session: Not on file  . Stress: Not on file  Relationships  . Social Herbalist on phone: Not on file    Gets together: Not on file    Attends religious service: Not on file    Active member of club or organization: Not on file    Attends meetings of clubs or organizations: Not on file    Relationship status: Not on file  . Intimate partner violence    Fear of current or ex partner: Not on file    Emotionally abused: Not on file    Physically abused: Not on file    Forced sexual activity: Not on file  Other Topics Concern  . Not on file  Social History Narrative  . Not on file    Outpatient Medications Prior to Visit  Medication Sig Dispense Refill  . ALPRAZolam (XANAX) 0.5 MG tablet Take 1 tablet (0.5 mg total) by mouth 2 (two) times daily as needed for Sleep or Anxiety. Take 1 tablet by mouth up to twice daily for sleep and anxiety.    Marland Kitchen aspirin EC 81 MG tablet Take 1 tablet (81 mg total) by mouth daily. 90 tablet 3  . atenolol (TENORMIN) 25 MG tablet Take 25 mg by mouth daily.     . Cholecalciferol (VITAMIN D PO) Take 1 tablet by mouth daily.     Marland Kitchen omeprazole (PRILOSEC) 40 MG capsule Take 1 capsule by mouth every day 90 capsule 0  . traMADol (ULTRAM) 50 MG tablet Take 50 mg by mouth every 6 (six) hours as needed. for pain  0  . losartan (COZAAR) 25 MG tablet Take 1 tablet by mouth daily.    Marland Kitchen ALPRAZolam (XANAX) 0.5 MG tablet Take 0.5-1 tablets (0.25-0.5 mg total) by mouth 4 (four) times daily as needed for anxiety. 20 tablet 0  . estradiol (ESTRACE) 1 MG tablet Take 1 tablet (1 mg total) daily by mouth. 90 tablet 3  . ezetimibe (ZETIA) 10 MG tablet Take 1 tablet (10 mg total) by mouth daily. 90 tablet 3  . gabapentin (NEURONTIN) 300 MG capsule Take 1 capsule by mouth 3 (three)  times daily.  3  . hydrochlorothiazide (HYDRODIURIL) 25 MG tablet  Take 25 mg by mouth every morning.  0  . Magnesium Citrate 200 MG TABS Take 200 mg by mouth daily. (Patient not taking: Reported on 03/26/2018) 30 tablet 6  . metoprolol tartrate (LOPRESSOR) 25 MG tablet Take 1 tablet (25 mg total) by mouth 2 (two) times daily. (Patient not taking: Reported on 10/23/2018) 1 tablet 0  . UNABLE TO FIND Med Name: GAIA Herbs Dietary supplament    . VOLTAREN 1 % GEL Apply 2 g topically daily as needed (pain).     Marland Kitchen 0.9 %  sodium chloride infusion      No facility-administered medications prior to visit.     Allergies  Allergen Reactions  . Ciprofloxacin Rash  . Lisinopril Swelling  . Pneumococcal Vaccine Swelling  . Pravastatin Other (See Comments)    Muscle aches     ROS Review of Systems  Constitutional: Negative.   HENT: Negative.   Respiratory: Negative.   Cardiovascular: Negative.   Gastrointestinal: Negative.   Endocrine: Negative for polyphagia and polyuria.  Genitourinary: Negative.   Musculoskeletal: Positive for back pain. Negative for gait problem.  Allergic/Immunologic: Negative for immunocompromised state.  Neurological: Negative for speech difficulty and light-headedness.  Psychiatric/Behavioral: Positive for dysphoric mood. The patient is nervous/anxious.       Objective:    Physical Exam  Constitutional: She is oriented to person, place, and time. She appears well-developed and well-nourished. No distress.  HENT:  Head: Normocephalic and atraumatic.  Right Ear: External ear normal.  Left Ear: External ear normal.  Mouth/Throat: Oropharynx is clear and moist. No oropharyngeal exudate.  Eyes: Pupils are equal, round, and reactive to light. Conjunctivae are normal. Right eye exhibits no discharge. Left eye exhibits no discharge. No scleral icterus.  Neck: Neck supple. No JVD present. No tracheal deviation present. No thyromegaly present.  Cardiovascular: Normal rate, regular rhythm and normal heart sounds.  Pulmonary/Chest: Effort  normal and breath sounds normal. No stridor.  Abdominal: Bowel sounds are normal.  Musculoskeletal:        General: No edema.  Lymphadenopathy:    She has no cervical adenopathy.  Neurological: She is alert and oriented to person, place, and time.  Skin: Skin is warm and dry. She is not diaphoretic.  Psychiatric: She has a normal mood and affect. Her behavior is normal.    BP 130/70   Pulse 60   Ht 5\' 4"  (1.626 m)   Wt 152 lb (68.9 kg)   SpO2 98%   BMI 26.09 kg/m  Wt Readings from Last 3 Encounters:  12/30/18 152 lb (68.9 kg)  10/23/18 159 lb 6.4 oz (72.3 kg)  02/20/18 163 lb (73.9 kg)   BP Readings from Last 3 Encounters:  12/30/18 130/70  10/23/18 132/80  03/26/18 (!) 121/59   Guideline developer:  UpToDate (see UpToDate for funding source) Date Released: June 2014  Health Maintenance Due  Topic Date Due  . Hepatitis C Screening  12/07/1945  . PAP SMEAR-Modifier  02/05/2012  . PNA vac Low Risk Adult (2 of 2 - PCV13) 03/08/2017    There are no preventive care reminders to display for this patient.  Lab Results  Component Value Date   TSH 1.93 05/30/2015   Lab Results  Component Value Date   WBC 6.4 07/22/2014   HGB 12.4 07/22/2014   HCT 37.8 07/22/2014   MCV 95.2 07/22/2014   PLT 167 07/22/2014   Lab Results  Component  Value Date   NA 142 02/11/2018   K 4.7 02/11/2018   CO2 28 02/11/2018   GLUCOSE 83 02/11/2018   BUN 11 02/11/2018   CREATININE 0.83 02/11/2018   BILITOT 0.2 02/11/2018   ALKPHOS 71 02/11/2018   AST 18 02/11/2018   ALT 10 02/11/2018   PROT 6.0 02/11/2018   ALBUMIN 3.9 02/11/2018   CALCIUM 8.7 02/11/2018   ANIONGAP 9 07/22/2014   Lab Results  Component Value Date   CHOL 184 10/23/2018   Lab Results  Component Value Date   HDL 43 10/23/2018   Lab Results  Component Value Date   LDLCALC 109 (H) 10/23/2018   Lab Results  Component Value Date   TRIG 158 (H) 10/23/2018   Lab Results  Component Value Date   CHOLHDL 4.3  10/23/2018   No results found for: HGBA1C    Assessment & Plan:   Problem List Items Addressed This Visit      Cardiovascular and Mediastinum   Essential hypertension   Relevant Medications   losartan (COZAAR) 25 MG tablet     Other   Insomnia due to other mental disorder   Relevant Medications   mirtazapine (REMERON) 15 MG tablet   Need for influenza vaccination - Primary   Relevant Orders   Flu Vaccine QUAD High Dose(Fluad) (Completed)    Other Visit Diagnoses    Anxiety with depression       Relevant Medications   ALPRAZolam (XANAX) 0.5 MG tablet   mirtazapine (REMERON) 15 MG tablet      Meds ordered this encounter  Medications  . mirtazapine (REMERON) 15 MG tablet    Sig: Take 1 tablet (15 mg total) by mouth at bedtime.    Dispense:  30 tablet    Refill:  1  . losartan (COZAAR) 25 MG tablet    Sig: Take 1 tablet (25 mg total) by mouth daily.    Dispense:  90 tablet    Refill:  1    Follow-up: Return in about 1 month (around 01/30/2019).   Expressed my concerns about taking an opiate with a benzodiazepine patient agrees to try Remeron will help her sleep at night and discontinue the Xanax.  She was advised that there may be some daytime drowsiness in the morning for a few days but that side effect should wane within a week or so.  We will discussed using a different statin.

## 2019-01-01 ENCOUNTER — Telehealth: Payer: Self-pay | Admitting: Family Medicine

## 2019-01-01 MED ORDER — GABAPENTIN 300 MG PO CAPS: 300.0000 mg | ORAL_CAPSULE | Freq: Three times a day (TID) | ORAL | 3 refills | Status: DC | PRN

## 2019-01-01 NOTE — Telephone Encounter (Signed)
Patient states that a medication for neuropathy was supposed to be sent into pharmacy. Patient was advised by pharmacy that they have not received RX.

## 2019-01-01 NOTE — Addendum Note (Signed)
Addended by: Jon Billings on: 01/01/2019 05:01 PM   Modules accepted: Orders

## 2019-01-02 MED ORDER — GABAPENTIN 300 MG PO CAPS: 300.0000 mg | ORAL_CAPSULE | Freq: Three times a day (TID) | ORAL | 3 refills | Status: DC | PRN

## 2019-01-02 NOTE — Addendum Note (Signed)
Addended by: Jon Billings on: 01/02/2019 04:44 PM   Modules accepted: Orders

## 2019-01-20 DIAGNOSIS — L570 Actinic keratosis: Secondary | ICD-10-CM | POA: Diagnosis not present

## 2019-01-20 DIAGNOSIS — D229 Melanocytic nevi, unspecified: Secondary | ICD-10-CM | POA: Diagnosis not present

## 2019-01-20 DIAGNOSIS — Z85828 Personal history of other malignant neoplasm of skin: Secondary | ICD-10-CM | POA: Diagnosis not present

## 2019-01-26 ENCOUNTER — Encounter: Payer: Self-pay | Admitting: Family Medicine

## 2019-01-26 ENCOUNTER — Ambulatory Visit (INDEPENDENT_AMBULATORY_CARE_PROVIDER_SITE_OTHER): Payer: PPO | Admitting: Family Medicine

## 2019-01-26 ENCOUNTER — Other Ambulatory Visit: Payer: Self-pay

## 2019-01-26 VITALS — BP 130/70 | HR 79 | Ht 64.0 in | Wt 155.0 lb

## 2019-01-26 DIAGNOSIS — F5105 Insomnia due to other mental disorder: Secondary | ICD-10-CM | POA: Diagnosis not present

## 2019-01-26 DIAGNOSIS — F99 Mental disorder, not otherwise specified: Secondary | ICD-10-CM | POA: Diagnosis not present

## 2019-01-26 DIAGNOSIS — Z789 Other specified health status: Secondary | ICD-10-CM

## 2019-01-26 HISTORY — DX: Other specified health status: Z78.9

## 2019-01-26 MED ORDER — QUETIAPINE FUMARATE 25 MG PO TABS: 25.0000 mg | ORAL_TABLET | Freq: Every day | ORAL | 0 refills | Status: DC

## 2019-01-26 NOTE — Progress Notes (Signed)
Established Patient Office Visit  Subjective:  Patient ID: Amanda [name], female    DOB: 1945-04-23  Age: 73 y.o. MRN: [id number]  CC:  Chief Complaint  Patient presents with  . Follow-up    HPI [name] presents for follow-up of her anxiety and back pain.  Patient did not tolerate the Remeron at all.  She had to stop it.  She has been on the Xanax for 5 years.  She takes it mostly at night and rarely during the day.  She denies any specific depression and says that her problem is mostly anxiety.  She had used more Xanax during the day after her husband had had his last stroke.  Says that the gabapentin did help her back pain.  She is statin intolerant as well.  Her blood pressure has been running lower at home than here today in the office.  She does have a history of coronary artery disease.  She is on the lowest possible doses of atenolol and Cozaar.  Past Medical History:  Diagnosis Date  . Arthritis   . Back pain   . CAD (coronary artery disease)    a. 01/2014: cath showing 70% stenosis of non-dominant RCA --> medically managed.   . Cataract    bilat removed  . Diverticulosis   . GERD (gastroesophageal reflux disease)   . Hemorrhoids   . Hiatal hernia   . Hyperlipidemia   . Hypertension   . Melanoma ([place])   . Orbital fracture (HCC)    right  . Osteopenia 07/2015   T score -2.0 FRAX 10%/1.4%  . Pituitary tumor   . Stricture of esophagus   . Vitamin D deficiency     Past Surgical History:  Procedure Laterality Date  . CERVICAL DISC SURGERY     x 2  . COLONOSCOPY    . EXCISION OF SKIN CANCER     Melanoma  . FOOT SURGERY    . HEMORRHOID BANDING     X3  . LEFT HEART CATHETERIZATION WITH CORONARY ANGIOGRAM N/A 02/03/2014   Procedure: LEFT HEART CATHETERIZATION WITH CORONARY ANGIOGRAM;  Surgeon: [name], MD;  Location: [facility] CATH LAB;  Service: Cardiovascular;  Laterality: N/A;  . LUMBAR [place] SURGERY  2018  . OOPHORECTOMY  2012   BSO  . PELVIC  LAPAROSCOPY  2012   Diag Lap-BSO-lysis of adhesions  . TUBAL LIGATION    . VAGINAL HYSTERECTOMY  1993    Family History  Problem Relation Age of Onset  . Heart disease Mother   . Hypertension Mother   . Stroke Mother   . Colon polyps Father   . Heart disease Father        CHF  . Hypertension Father   . Cancer Father        COLON  . Colon cancer Father   . Colon cancer Sister   . Colon polyps Sister   . Heart disease Sister   . Diabetes Sister   . Melanoma Sister   . Colon polyps Brother   . Diabetes Brother   . Heart disease Brother   . Cancer Brother        COLON  . Melanoma Brother   . Colon cancer Brother   . Melanoma Brother   . Esophageal cancer Neg Hx   . Rectal cancer Neg Hx   . Stomach cancer Neg Hx     Social History   Socioeconomic History  . Marital status: Married  Spouse name: Not on file  . Number of children: 3  . Years of education: Not on file  . Highest education level: Not on file  Occupational History  . Occupation: Disabled  Social Needs  . Financial resource strain: Not on file  . Food insecurity    Worry: Not on file    Inability: Not on file  . Transportation needs    Medical: Not on file    Non-medical: Not on file  Tobacco Use  . Smoking status: Former Smoker    Quit date: 03/19/1994    Years since quitting: 24.8  . Smokeless tobacco: Never Used  Substance and Sexual Activity  . Alcohol use: No    Alcohol/week: 0.0 standard drinks  . Drug use: No  . Sexual activity: Not Currently    Birth control/protection: Surgical    Comment: 1st intercourse 73 yo-Fewer than 5 partners,des neg  Lifestyle  . Physical activity    Days per week: Not on file    Minutes per session: Not on file  . Stress: Not on file  Relationships  . Social Herbalist on phone: Not on file    Gets together: Not on file    Attends religious service: Not on file    Active member of club or organization: Not on file    Attends meetings of  clubs or organizations: Not on file    Relationship status: Not on file  . Intimate partner violence    Fear of current or ex partner: Not on file    Emotionally abused: Not on file    Physically abused: Not on file    Forced sexual activity: Not on file  Other Topics Concern  . Not on file  Social History Narrative  . Not on file    Outpatient Medications Prior to Visit  Medication Sig Dispense Refill  . ALPRAZolam (XANAX) 0.5 MG tablet Take 1 tablet (0.5 mg total) by mouth 2 (two) times daily as needed for Sleep or Anxiety. Take 1 tablet by mouth up to twice daily for sleep and anxiety.    Marland Kitchen aspirin EC 81 MG tablet Take 1 tablet (81 mg total) by mouth daily. 90 tablet 3  . atenolol (TENORMIN) 25 MG tablet Take 25 mg by mouth daily.     . Cholecalciferol (VITAMIN D PO) Take 1 tablet by mouth daily.     Marland Kitchen gabapentin (NEURONTIN) 300 MG capsule Take 1 capsule (300 mg total) by mouth 3 (three) times daily as needed. 30 capsule 3  . losartan (COZAAR) 25 MG tablet Take 1 tablet (25 mg total) by mouth daily. 90 tablet 1  . mirtazapine (REMERON) 15 MG tablet Take 1 tablet (15 mg total) by mouth at bedtime. 30 tablet 1  . omeprazole (PRILOSEC) 40 MG capsule Take 1 capsule by mouth every day 90 capsule 0  . traMADol (ULTRAM) 50 MG tablet Take 50 mg by mouth every 6 (six) hours as needed. for pain  0   No facility-administered medications prior to visit.     Allergies  Allergen Reactions  . Ciprofloxacin Rash  . Lisinopril Swelling  . Pneumococcal Vaccine Swelling  . Pravastatin Other (See Comments)    Muscle aches     ROS Review of Systems  Constitutional: Negative.   Respiratory: Negative.   Cardiovascular: Negative.   Gastrointestinal: Negative.   Allergic/Immunologic: Negative for immunocompromised state.  Neurological: Negative for light-headedness and numbness.  Psychiatric/Behavioral: Positive for sleep disturbance. Negative for  dysphoric mood. The patient is  nervous/anxious.       Objective:    Physical Exam  Constitutional: She is oriented to person, place, and time. She appears well-developed and well-nourished. No distress.  HENT:  Head: Normocephalic and atraumatic.  Right Ear: External ear normal.  Left Ear: External ear normal.  Eyes: Conjunctivae are normal. Right eye exhibits no discharge. Left eye exhibits no discharge. No scleral icterus.  Neck: No tracheal deviation present.  Pulmonary/Chest: Effort normal. No stridor.  Neurological: She is alert and oriented to person, place, and time.  Skin: Skin is warm and dry. She is not diaphoretic.  Psychiatric: She has a normal mood and affect. Her behavior is normal.    BP 130/70   Pulse 79   Ht 5\' 4"  (1.626 m)   Wt 155 lb (70.3 kg)   SpO2 99%   BMI 26.61 kg/m  Wt Readings from Last 3 Encounters:  01/26/19 155 lb (70.3 kg)  12/30/18 152 lb (68.9 kg)  10/23/18 159 lb 6.4 oz (72.3 kg)   BP Readings from Last 3 Encounters:  01/26/19 130/70  12/30/18 130/70  10/23/18 132/80   Guideline developer:  UpToDate (see UpToDate for funding source) Date Released: June 2014  Health Maintenance Due  Topic Date Due  . Hepatitis C Screening  1945-06-12  . PAP SMEAR-Modifier  02/05/2012  . PNA vac Low Risk Adult (2 of 2 - PCV13) 03/08/2017    There are no preventive care reminders to display for this patient.  Lab Results  Component Value Date   TSH 1.93 05/30/2015   Lab Results  Component Value Date   WBC 6.4 07/22/2014   HGB 12.4 07/22/2014   HCT 37.8 07/22/2014   MCV 95.2 07/22/2014   PLT 167 07/22/2014   Lab Results  Component Value Date   NA 142 02/11/2018   K 4.7 02/11/2018   CO2 28 02/11/2018   GLUCOSE 83 02/11/2018   BUN 11 02/11/2018   CREATININE 0.83 02/11/2018   BILITOT 0.2 02/11/2018   ALKPHOS 71 02/11/2018   AST 18 02/11/2018   ALT 10 02/11/2018   PROT 6.0 02/11/2018   ALBUMIN 3.9 02/11/2018   CALCIUM 8.7 02/11/2018   ANIONGAP 9 07/22/2014   Lab  Results  Component Value Date   CHOL 184 10/23/2018   Lab Results  Component Value Date   HDL 43 10/23/2018   Lab Results  Component Value Date   LDLCALC 109 (H) 10/23/2018   Lab Results  Component Value Date   TRIG 158 (H) 10/23/2018   Lab Results  Component Value Date   CHOLHDL 4.3 10/23/2018   No results found for: HGBA1C    Assessment & Plan:   Problem List Items Addressed This Visit      Other   Insomnia due to other mental disorder - Primary   Relevant Medications   QUEtiapine (SEROQUEL) 25 MG tablet   Statin intolerance      Meds ordered this encounter  Medications  . QUEtiapine (SEROQUEL) 25 MG tablet    Sig: Take 1 tablet (25 mg total) by mouth at bedtime.    Dispense:  30 tablet    Refill:  0    Follow-up: Return in about 3 months (around 04/28/2019), or if symptoms worsen or fail to improve.   Patient agrees to give Seroquel low a try for insomnia.  She assures me that she does not take all TRAM and Xanax together.  She is aware of the risk  for increased falls taking either 1 of these medicines and especially together.  Advised patient to stay hydrated.  Believe that she needs the Tenormin and losartan.

## 2019-02-23 IMAGING — XA Imaging study
2 series · 2 of 2 positions shown · non-contrast
Comparison: none

CLINICAL DATA: Low back and right leg pain. Good response to
previous injections. Spondylosis without myelopathy.

[Series 1: ortho standard · 1 of 1 slices shown (1 of 2)]
[im 1/1]
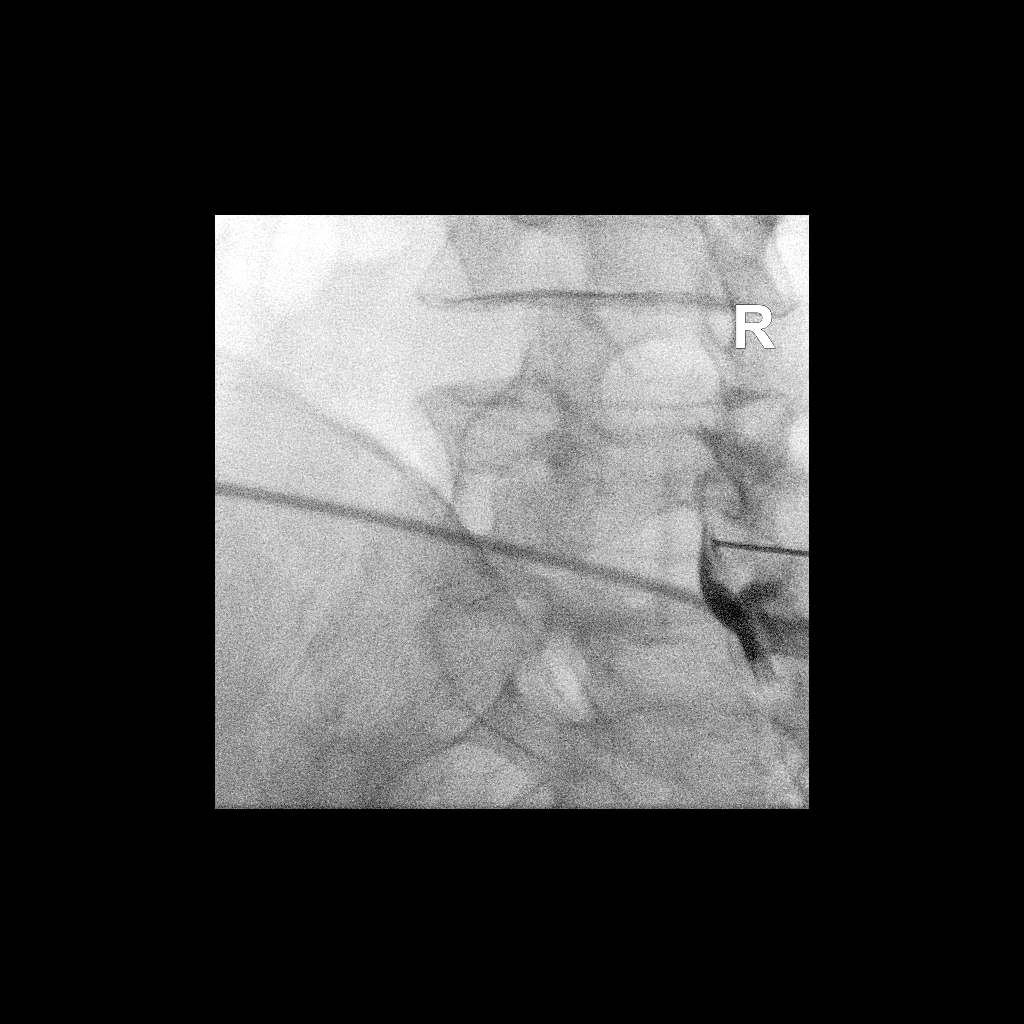

[Series 2: ortho standard · 1 of 1 slices shown (2 of 2)]
[im 1/1]
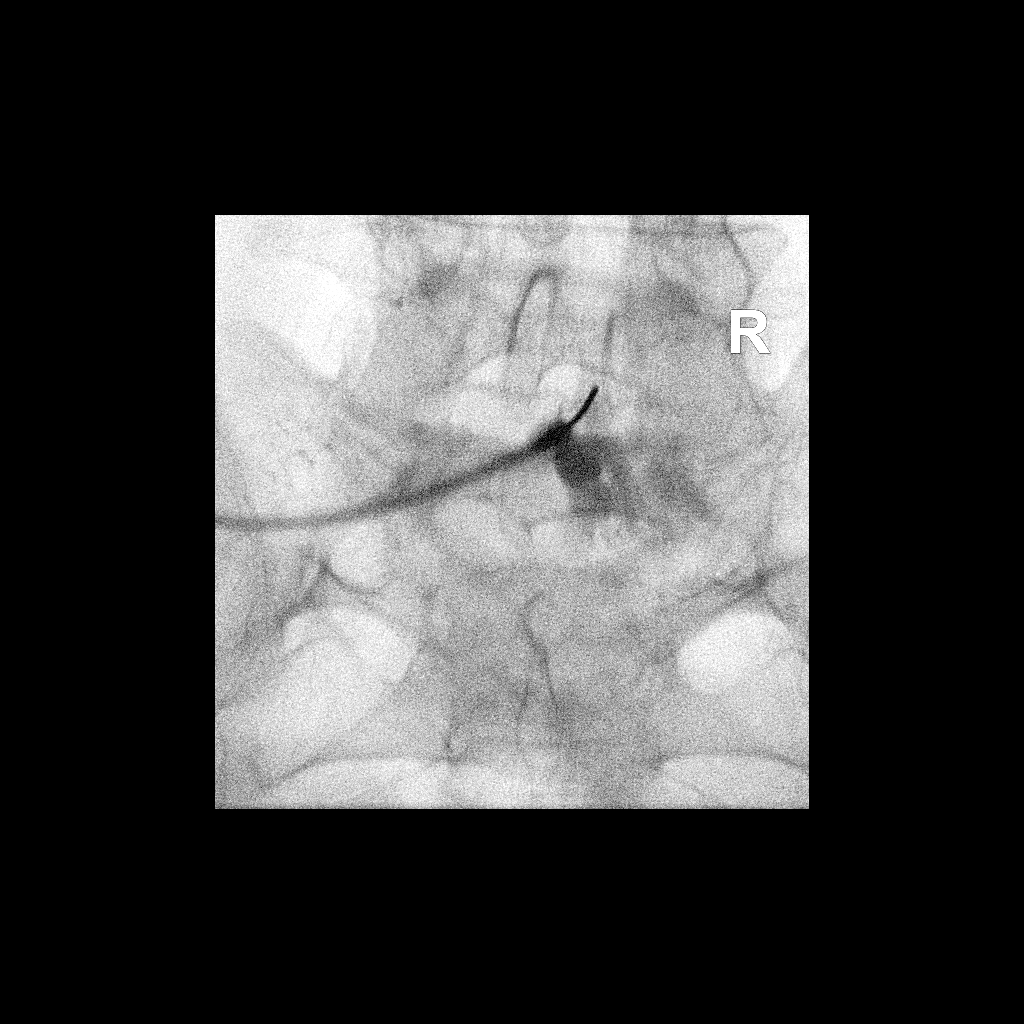

[2 of 2 positions shown; findings below may reference images not displayed]

FLUOROSCOPY TIME:  0 minutes 18 seconds. 16.82 micro gray meter
squared

PROCEDURE:
The procedure, risks, benefits, and alternatives were explained to
the patient. Questions regarding the procedure were encouraged and
answered. The patient understands and consents to the procedure.

LUMBAR EPIDURAL INJECTION:

An interlaminar approach was performed on right at L5-S1. The
overlying skin was cleansed and anesthetized. A 20 gauge epidural
needle was advanced using loss-of-resistance technique.

DIAGNOSTIC EPIDURAL INJECTION:

Injection of Isovue-M 200 shows a good epidural pattern with spread
above and below the level of needle placement, primarily on the
right. No vascular opacification is seen.

THERAPEUTIC EPIDURAL INJECTION:

One hundred twenty Mg of Depo-Medrol mixed with 2.5 cc 1% lidocaine
were instilled. The procedure was well-tolerated, and the patient
was discharged thirty minutes following the injection in good
condition.

COMPLICATIONS:
None
IMPRESSION: Technically successful epidural injection on the right at L5-S1.

## 2019-03-25 DIAGNOSIS — M5030 Other cervical disc degeneration, unspecified cervical region: Secondary | ICD-10-CM | POA: Insufficient documentation

## 2019-04-14 DIAGNOSIS — M5136 Other intervertebral disc degeneration, lumbar region: Secondary | ICD-10-CM | POA: Diagnosis not present

## 2019-04-14 DIAGNOSIS — M5030 Other cervical disc degeneration, unspecified cervical region: Secondary | ICD-10-CM | POA: Diagnosis not present

## 2019-04-14 DIAGNOSIS — M961 Postlaminectomy syndrome, not elsewhere classified: Secondary | ICD-10-CM | POA: Diagnosis not present

## 2019-04-23 ENCOUNTER — Telehealth: Payer: Self-pay | Admitting: Family Medicine

## 2019-04-23 DIAGNOSIS — G629 Polyneuropathy, unspecified: Secondary | ICD-10-CM

## 2019-04-23 NOTE — Telephone Encounter (Signed)
Patient is calling and requesting a refill Gabapentin 90 day supply sent to [facility] in [place]. CB is [phone]

## 2019-04-24 DIAGNOSIS — G894 Chronic pain syndrome: Secondary | ICD-10-CM | POA: Diagnosis not present

## 2019-04-24 DIAGNOSIS — M4696 Unspecified inflammatory spondylopathy, lumbar region: Secondary | ICD-10-CM | POA: Diagnosis not present

## 2019-04-24 MED ORDER — GABAPENTIN 300 MG PO CAPS: 300.0000 mg | ORAL_CAPSULE | Freq: Three times a day (TID) | ORAL | 3 refills | Status: DC | PRN

## 2019-04-24 NOTE — Telephone Encounter (Signed)
Rx sent to pharmacy   

## 2019-04-24 NOTE — Telephone Encounter (Signed)
Yes that would be okay

## 2019-04-28 ENCOUNTER — Ambulatory Visit: Payer: PPO | Admitting: Family Medicine

## 2019-06-04 DIAGNOSIS — M47816 Spondylosis without myelopathy or radiculopathy, lumbar region: Secondary | ICD-10-CM | POA: Diagnosis not present

## 2019-06-18 DIAGNOSIS — M545 Low back pain: Secondary | ICD-10-CM | POA: Diagnosis not present

## 2019-06-18 DIAGNOSIS — M5416 Radiculopathy, lumbar region: Secondary | ICD-10-CM | POA: Diagnosis not present

## 2019-06-29 DIAGNOSIS — M5416 Radiculopathy, lumbar region: Secondary | ICD-10-CM | POA: Diagnosis not present

## 2019-07-07 DIAGNOSIS — M5416 Radiculopathy, lumbar region: Secondary | ICD-10-CM | POA: Diagnosis not present

## 2019-07-11 ENCOUNTER — Emergency Department (HOSPITAL_COMMUNITY)
Admission: EM | Admit: 2019-07-11 | Discharge: 2019-07-12 | Disposition: A | Discharge status: Home / Self Care | Payer: PPO | Attending: Emergency Medicine | Admitting: Emergency Medicine

## 2019-07-11 ENCOUNTER — Encounter (HOSPITAL_COMMUNITY): Payer: Self-pay | Admitting: Emergency Medicine

## 2019-07-11 ENCOUNTER — Other Ambulatory Visit: Payer: Self-pay

## 2019-07-11 DIAGNOSIS — R1011 Right upper quadrant pain: Secondary | ICD-10-CM | POA: Insufficient documentation

## 2019-07-11 DIAGNOSIS — R1031 Right lower quadrant pain: Secondary | ICD-10-CM | POA: Insufficient documentation

## 2019-07-11 DIAGNOSIS — N390 Urinary tract infection, site not specified: Secondary | ICD-10-CM | POA: Diagnosis not present

## 2019-07-11 DIAGNOSIS — Z5321 Procedure and treatment not carried out due to patient leaving prior to being seen by health care provider: Secondary | ICD-10-CM | POA: Insufficient documentation

## 2019-07-11 DIAGNOSIS — R300 Dysuria: Secondary | ICD-10-CM | POA: Diagnosis not present

## 2019-07-11 DIAGNOSIS — B9620 Unspecified Escherichia coli [E. coli] as the cause of diseases classified elsewhere: Secondary | ICD-10-CM | POA: Diagnosis not present

## 2019-07-11 LAB — COMPREHENSIVE METABOLIC PANEL
ALT: 21 U/L (ref 0–44)
AST: 24 U/L (ref 15–41)
Albumin: 3.8 g/dL (ref 3.5–5.0)
Alkaline Phosphatase: 73 U/L (ref 38–126)
Anion gap: 10 (ref 5–15)
BUN: 8 mg/dL (ref 8–23)
CO2: 28 mmol/L (ref 22–32)
Calcium: 9 mg/dL (ref 8.9–10.3)
Chloride: 102 mmol/L (ref 98–111)
Creatinine, Ser: 0.84 mg/dL (ref 0.44–1.00)
GFR calc Af Amer: >60 mL/min (ref >60)
GFR calc non Af Amer: >60 mL/min (ref >60)
Glucose, Bld: 102 mg/dL — ABNORMAL HIGH (ref 70–99)
Potassium: 3.8 mmol/L (ref 3.5–5.1)
Sodium: 140 mmol/L (ref 135–145)
Total Bilirubin: 0.8 mg/dL (ref 0.3–1.2)
Total Protein: 6.8 g/dL (ref 6.5–8.1)

## 2019-07-11 LAB — CBC
HCT: 41.2 % (ref 36.0–46.0)
Hemoglobin: 13.3 g/dL (ref 12.0–15.0)
MCH: 31.5 pg (ref 26.0–34.0)
MCHC: 32.3 g/dL (ref 30.0–36.0)
MCV: 97.6 fL (ref 80.0–100.0)
Platelets: 220 10*3/uL (ref 150–400)
RBC: 4.22 MIL/uL (ref 3.87–5.11)
RDW: 12.3 % (ref 11.5–15.5)
WBC: 8.2 10*3/uL (ref 4.0–10.5)
nRBC: 0 % (ref 0.0–0.2)

## 2019-07-11 LAB — URINALYSIS, ROUTINE W REFLEX MICROSCOPIC
Bilirubin Urine: NEGATIVE
Glucose, UA: NEGATIVE mg/dL
Ketones, ur: NEGATIVE mg/dL
Nitrite: NEGATIVE
Protein, ur: 100 mg/dL — AB
RBC / HPF: >50 RBC/hpf — ABNORMAL HIGH (ref 0–5)
Specific Gravity, Urine: 1.008 (ref 1.005–1.030)
WBC, UA: >50 WBC/hpf — ABNORMAL HIGH (ref 0–5)
pH: 5 (ref 5.0–8.0)

## 2019-07-11 LAB — LIPASE, BLOOD: Lipase: 27 U/L (ref 11–51)

## 2019-07-11 MED ORDER — SODIUM CHLORIDE 0.9% FLUSH: 3.0000 mL | Freq: Once | INTRAVENOUS | Status: DC

## 2019-07-11 NOTE — ED Triage Notes (Signed)
C/o R sided abd pain since Thursday.  Denies nausea, vomiting, diarrhea, urinary complaints, and constipation.

## 2019-07-11 NOTE — ED Notes (Addendum)
Pt upset about the wait time, this NT explained that she is our longest wait. Pt states that she is leaving and she did not want to be billed for this visit. This NT explained that this was not up to [place].

## 2019-07-12 DIAGNOSIS — I10 Essential (primary) hypertension: Secondary | ICD-10-CM | POA: Diagnosis not present

## 2019-07-12 DIAGNOSIS — R1084 Generalized abdominal pain: Secondary | ICD-10-CM | POA: Diagnosis not present

## 2019-07-12 DIAGNOSIS — R1031 Right lower quadrant pain: Secondary | ICD-10-CM | POA: Diagnosis not present

## 2019-07-12 DIAGNOSIS — K219 Gastro-esophageal reflux disease without esophagitis: Secondary | ICD-10-CM | POA: Diagnosis not present

## 2019-07-12 DIAGNOSIS — N10 Acute tubulo-interstitial nephritis: Secondary | ICD-10-CM | POA: Diagnosis not present

## 2019-07-12 DIAGNOSIS — N12 Tubulo-interstitial nephritis, not specified as acute or chronic: Secondary | ICD-10-CM | POA: Diagnosis not present

## 2019-07-12 DIAGNOSIS — Z79899 Other long term (current) drug therapy: Secondary | ICD-10-CM | POA: Diagnosis not present

## 2019-07-21 DIAGNOSIS — N898 Other specified noninflammatory disorders of vagina: Secondary | ICD-10-CM | POA: Diagnosis not present

## 2019-07-21 DIAGNOSIS — K219 Gastro-esophageal reflux disease without esophagitis: Secondary | ICD-10-CM | POA: Diagnosis not present

## 2019-07-21 DIAGNOSIS — E782 Mixed hyperlipidemia: Secondary | ICD-10-CM | POA: Diagnosis not present

## 2019-07-21 DIAGNOSIS — G8929 Other chronic pain: Secondary | ICD-10-CM | POA: Diagnosis not present

## 2019-07-21 DIAGNOSIS — M5441 Lumbago with sciatica, right side: Secondary | ICD-10-CM | POA: Diagnosis not present

## 2019-07-21 DIAGNOSIS — Z0000 Encounter for general adult medical examination without abnormal findings: Secondary | ICD-10-CM | POA: Diagnosis not present

## 2019-07-21 DIAGNOSIS — N3946 Mixed incontinence: Secondary | ICD-10-CM | POA: Diagnosis not present

## 2019-07-21 DIAGNOSIS — R5381 Other malaise: Secondary | ICD-10-CM | POA: Diagnosis not present

## 2019-07-21 DIAGNOSIS — B373 Candidiasis of vulva and vagina: Secondary | ICD-10-CM | POA: Diagnosis not present

## 2019-07-21 DIAGNOSIS — I10 Essential (primary) hypertension: Secondary | ICD-10-CM | POA: Diagnosis not present

## 2019-07-27 ENCOUNTER — Other Ambulatory Visit: Payer: Self-pay

## 2019-07-28 ENCOUNTER — Encounter: Payer: Self-pay | Admitting: Nurse Practitioner

## 2019-07-28 ENCOUNTER — Ambulatory Visit: Payer: PPO | Admitting: Nurse Practitioner

## 2019-07-28 VITALS — BP 130/80 | Ht 64.0 in | Wt 157.0 lb

## 2019-07-28 DIAGNOSIS — E559 Vitamin D deficiency, unspecified: Secondary | ICD-10-CM

## 2019-07-28 DIAGNOSIS — N3946 Mixed incontinence: Secondary | ICD-10-CM

## 2019-07-28 DIAGNOSIS — Z9189 Other specified personal risk factors, not elsewhere classified: Secondary | ICD-10-CM | POA: Diagnosis not present

## 2019-07-28 DIAGNOSIS — Z01419 Encounter for gynecological examination (general) (routine) without abnormal findings: Secondary | ICD-10-CM | POA: Diagnosis not present

## 2019-07-28 DIAGNOSIS — Z780 Asymptomatic menopausal state: Secondary | ICD-10-CM

## 2019-07-28 DIAGNOSIS — M8580 Other specified disorders of bone density and structure, unspecified site: Secondary | ICD-10-CM

## 2019-07-28 NOTE — Patient Instructions (Addendum)
Increase Vitamin D to 2000 units Take multivitamin daily Schedule mammogram and Bone Scan  Health Maintenance After Age 72 After age 24, you are at a higher risk for certain long-term diseases and infections as well as injuries from falls. Falls are a major cause of broken bones and head injuries in people who are older than age 50. Getting regular preventive care can help to keep you healthy and well. Preventive care includes getting regular testing and making lifestyle changes as recommended by your health care provider. Talk with your health care provider about:  Which screenings and tests you should have. A screening is a test that checks for a disease when you have no symptoms.  A diet and exercise plan that is right for you. What should I know about screenings and tests to prevent falls? Screening and testing are the best ways to find a health problem early. Early diagnosis and treatment give you the best chance of managing medical conditions that are common after age 82. Certain conditions and lifestyle choices may make you more likely to have a fall. Your health care provider may recommend:  Regular vision checks. Poor vision and conditions such as cataracts can make you more likely to have a fall. If you wear glasses, make sure to get your prescription updated if your vision changes.  Medicine review. Work with your health care provider to regularly review all of the medicines you are taking, including over-the-counter medicines. Ask your health care provider about any side effects that may make you more likely to have a fall. Tell your health care provider if any medicines that you take make you feel dizzy or sleepy.  Osteoporosis screening. Osteoporosis is a condition that causes the bones to get weaker. This can make the bones weak and cause them to break more easily.  Blood pressure screening. Blood pressure changes and medicines to control blood pressure can make you feel  dizzy.  Strength and balance checks. Your health care provider may recommend certain tests to check your strength and balance while standing, walking, or changing positions.  Foot health exam. Foot pain and numbness, as well as not wearing proper footwear, can make you more likely to have a fall.  Depression screening. You may be more likely to have a fall if you have a fear of falling, feel emotionally low, or feel unable to do activities that you used to do.  Alcohol use screening. Using too much alcohol can affect your balance and may make you more likely to have a fall. What actions can I take to lower my risk of falls? General instructions  Talk with your health care provider about your risks for falling. Tell your health care provider if: ? You fall. Be sure to tell your health care provider about all falls, even ones that seem minor. ? You feel dizzy, sleepy, or off-balance.  Take over-the-counter and prescription medicines only as told by your health care provider. These include any supplements.  Eat a healthy diet and maintain a healthy weight. A healthy diet includes low-fat dairy products, low-fat (lean) meats, and fiber from whole grains, beans, and lots of fruits and vegetables. Home safety  Remove any tripping hazards, such as rugs, cords, and clutter.  Install safety equipment such as grab bars in bathrooms and safety rails on stairs.  Keep rooms and walkways well-lit. Activity   Follow a regular exercise program to stay fit. This will help you maintain your balance. Ask your health care provider  what types of exercise are appropriate for you.  If you need a cane or walker, use it as recommended by your health care provider.  Wear supportive shoes that have nonskid soles. Lifestyle  Do not drink alcohol if your health care provider tells you not to drink.  If you drink alcohol, limit how much you have: ? 0-1 drink a day for women. ? 0-2 drinks a day for  men.  Be aware of how much alcohol is in your drink. In the [place] one drink equals one typical bottle of beer (12 oz), one-half glass of wine (5 oz), or one shot of hard liquor (1 oz).  Do not use any products that contain nicotine or tobacco, such as cigarettes and e-cigarettes. If you need help quitting, ask your health care provider. Summary  Having a healthy lifestyle and getting preventive care can help to protect your health and wellness after age 7.  Screening and testing are the best way to find a health problem early and help you avoid having a fall. Early diagnosis and treatment give you the best chance for managing medical conditions that are more common for people who are older than age 40.  Falls are a major cause of broken bones and head injuries in people who are older than age 70. Take precautions to prevent a fall at home.  Work with your health care provider to learn what changes you can make to improve your health and wellness and to prevent falls. This information is not intended to replace advice given to you by your health care provider. Make sure you discuss any questions you have with your health care provider. Document Revised: 06/26/2018 Document Reviewed: 01/16/2017 Elsevier Patient Education  2020 [employer].

## 2019-07-28 NOTE — Progress Notes (Signed)
   Amanda [name] 17-Jan-1946 [id number]   History:  74 y.o. G3 P2 presents for breast and pelvic exam. Complains of urinary leakage that began about a year ago but has worsened over the last few months.  Saw PCP 07/21/2019 for this and was prescribed Myrbetriq 25 mg daily.  Patient says she has not noticed a difference yet.  07/12/2019 was treated for cystitis in the emergency department.  Has been on 2 antibiotics for this.  Says symptoms are better.  Postmenopausal -no HRT, no bleeding.  History of vitamin D deficiency, osteopenia, hypertension, melanoma.  Takes 1000 units vitamin D daily, does not take calcium or multivitamin.  Not sexually active.   Gynecologic History No LMP recorded. Patient has had a hysterectomy.   Contraception: status post hysterectomy Last Pap: deferred Last mammogram: 01/17/18. Results were: normal DEXA: 07/2015. Results: T-score -2.0 FRAX 10%/1.4% Colonoscopy: 2017. Results: normal  Past medical history, past surgical history, family history and social history were all reviewed and documented in the EPIC chart.  ROS:  A ROS was performed and pertinent positives and negatives are included.  Exam:  Vitals:   07/28/19 0819  BP: 130/80  Weight: 157 lb (71.2 kg)  Height: 5\' 4"  (1.626 m)   Body mass index is 26.95 kg/m.  General appearance:  Normal Thyroid:  Symmetrical, normal in size, without palpable masses or nodularity. Respiratory  Auscultation:  Clear without wheezing or rhonchi Cardiovascular  Auscultation:  Regular rate, without rubs, murmurs or gallops  Edema/varicosities:  Not grossly evident Abdominal  Soft,nontender, without masses, guarding or rebound.  Liver/spleen:  No organomegaly noted  Hernia:  None appreciated  Skin  Inspection:  Grossly normal   Breasts: Examined lying and sitting.   Right: Without masses, retractions, discharge or axillary adenopathy.   Left: Without masses, retractions, discharge or axillary adenopathy.  Gentitourinary   Inguinal/mons:  Normal without inguinal adenopathy  External genitalia:  Normal  BUS/Urethra/Skene's glands:  Normal  Vagina:  Atrophic, negative for cystocele  Cervix:  Normal  Uterus:  Normal in size, shape and contour.  Midline and mobile  Adnexa/parametria:     Rt: Without masses or tenderness.   Lt: Without masses or tenderness.  Anus and perineum: Normal  Digital rectal exam: Normal sphincter tone without palpated masses or tenderness  Assessment/Plan:  74 y.o.  for breast and pelvic exam.   Well female exam with routine gynecological exam - Plan: Urinalysis,Complete w/RFL Culture. Education provided on SBEs, importance of preventative screenings, current guidelines, daily calcium and vitamin D supplement, and regular exercise including weight-bearing exercises. Instructed to schedule mammogram, information provided.   Vitamin D deficiency - Plan: Vitamin D (25 hydroxy), Increase Vitamin D to 2000 units daily  Osteopenia - Increase daily Vitamin D to 2000 units daily and add calcium. If unable to tolerate calcium add multivitamin. Information provided on scheduling bone density at [facility] for convenience.   Mixed stress and urge incontinence - educated patient on new medication and that it may take up to 4 weeks to be effective.   Follow up in 1 year for annual       [place], 8:26 AM 07/28/2019

## 2019-07-29 LAB — URINALYSIS, COMPLETE W/RFL CULTURE
Bacteria, UA: NONE SEEN /HPF
Bilirubin Urine: NEGATIVE
Glucose, UA: NEGATIVE
Hgb urine dipstick: NEGATIVE
Hyaline Cast: NONE SEEN /LPF
Ketones, ur: NEGATIVE
Leukocyte Esterase: NEGATIVE
Nitrites, Initial: NEGATIVE
Protein, ur: NEGATIVE
RBC / HPF: NONE SEEN /HPF (ref 0–2)
Specific Gravity, Urine: 1.009 (ref 1.001–1.03)
Squamous Epithelial / HPF: NONE SEEN /HPF (ref <=5)
WBC, UA: NONE SEEN /HPF (ref 0–5)
pH: 7 (ref 5.0–8.0)

## 2019-07-29 LAB — VITAMIN D 25 HYDROXY (VIT D DEFICIENCY, FRACTURES): Vit D, 25-Hydroxy: 29 ng/mL — ABNORMAL LOW (ref 30–100)

## 2019-07-29 LAB — NO CULTURE INDICATED

## 2019-09-03 DIAGNOSIS — N3946 Mixed incontinence: Secondary | ICD-10-CM | POA: Diagnosis not present

## 2019-10-15 DIAGNOSIS — N3946 Mixed incontinence: Secondary | ICD-10-CM | POA: Diagnosis not present

## 2019-11-30 NOTE — Progress Notes (Deleted)
HPI: FU CP and dyspnea. See previous notes for details. Stress echocardiogram October 2015 showed chest pain, electocardiographic changes and possible anteroapical wall motion abnormality. Cardiac catheterization November 2015 showed a 70% small nondominant right coronary artery and an ejection fraction of 55-65%. Medical therapy recommended. Nuclear study December 2019 showed ejection fraction 62% poor quality study question small apical infarct and mild inferobasilar ischemia.  Coronary CTA January 2020 showed calcium score 53 and mild stenosis proximal LAD.  Treated medically.  Since last seen,   Current Outpatient Medications  Medication Sig Dispense Refill  . aspirin EC 81 MG tablet Take 1 tablet (81 mg total) by mouth daily. 90 tablet 3  . atenolol (TENORMIN) 25 MG tablet Take 25 mg by mouth daily.     . Cholecalciferol (VITAMIN D PO) Take 1 tablet by mouth daily.     Marland Kitchen gabapentin (NEURONTIN) 300 MG capsule Take 1 capsule (300 mg total) by mouth 3 (three) times daily as needed. 90 capsule 3  . losartan (COZAAR) 25 MG tablet Take 1 tablet (25 mg total) by mouth daily. 90 tablet 1  . omeprazole (PRILOSEC) 40 MG capsule Take 1 capsule by mouth every day 90 capsule 0  . QUEtiapine (SEROQUEL) 25 MG tablet Take 1 tablet (25 mg total) by mouth at bedtime. 30 tablet 0  . traMADol (ULTRAM) 50 MG tablet Take 50 mg by mouth every 6 (six) hours as needed. for pain  0   No current facility-administered medications for this visit.     Past Medical History:  Diagnosis Date  . Arthritis   . Back pain   . CAD (coronary artery disease)    a. 01/2014: cath showing 70% stenosis of non-dominant RCA --> medically managed.   . Cataract    bilat removed  . Diverticulosis   . GERD (gastroesophageal reflux disease)   . Hemorrhoids   . Hiatal hernia   . Hyperlipidemia   . Hypertension   . Melanoma ([place])   . Orbital fracture ([facility])    right  . Osteopenia 07/2015   T score -2.0 FRAX 10%/1.4%  .  Pituitary tumor   . Stricture of esophagus   . Vitamin D deficiency     Past Surgical History:  Procedure Laterality Date  . CERVICAL DISC SURGERY     x 2  . COLONOSCOPY    . EXCISION OF SKIN CANCER     Melanoma  . FOOT SURGERY    . HEMORRHOID BANDING     X3  . LEFT HEART CATHETERIZATION WITH CORONARY ANGIOGRAM N/A 02/03/2014   Procedure: LEFT HEART CATHETERIZATION WITH CORONARY ANGIOGRAM;  Surgeon: [name], MD;  Location: [facility] CATH LAB;  Service: Cardiovascular;  Laterality: N/A;  . LUMBAR [place] SURGERY  2018  . OOPHORECTOMY  2012   BSO  . PELVIC LAPAROSCOPY  2012   Diag Lap-BSO-lysis of adhesions  . TUBAL LIGATION    . VAGINAL HYSTERECTOMY  1993    Social History   Socioeconomic History  . Marital status: Married    Spouse name: Not on file  . Number of children: 3  . Years of education: Not on file  . Highest education level: Not on file  Occupational History  . Occupation: Disabled  Tobacco Use  . Smoking status: Former Smoker    Quit date: 03/19/1994    Years since quitting: 25.7  . Smokeless tobacco: Never Used  Vaping Use  . Vaping Use: Never used  Substance and Sexual  Activity  . Alcohol use: No    Alcohol/week: 0.0 standard drinks  . Drug use: No  . Sexual activity: Not Currently    Birth control/protection: Surgical    Comment: 1st intercourse 74 yo-Fewer than 5 partners,des neg  Other Topics Concern  . Not on file  Social History Narrative  . Not on file   Social Determinants of Health   Financial Resource Strain:   . Difficulty of Paying Living Expenses: Not on file  Food Insecurity:   . Worried About Charity fundraiser in the Last Year: Not on file  . Ran Out of Food in the Last Year: Not on file  Transportation Needs:   . Lack of Transportation (Medical): Not on file  . Lack of Transportation (Non-Medical): Not on file  Physical Activity:   . Days of Exercise per Week: Not on file  . Minutes of Exercise per Session: Not on file    Stress:   . Feeling of Stress : Not on file  Social Connections:   . Frequency of Communication with Friends and Family: Not on file  . Frequency of Social Gatherings with Friends and Family: Not on file  . Attends Religious Services: Not on file  . Active Member of Clubs or Organizations: Not on file  . Attends Archivist Meetings: Not on file  . Marital Status: Not on file  Intimate Partner Violence:   . Fear of Current or Ex-Partner: Not on file  . Emotionally Abused: Not on file  . Physically Abused: Not on file  . Sexually Abused: Not on file    Family History  Problem Relation Age of Onset  . Heart disease Mother   . Hypertension Mother   . Stroke Mother   . Colon polyps Father   . Heart disease Father        CHF  . Hypertension Father   . Cancer Father        COLON  . Colon cancer Father   . Colon cancer Sister   . Colon polyps Sister   . Heart disease Sister   . Diabetes Sister   . Melanoma Sister   . Colon polyps Brother   . Diabetes Brother   . Heart disease Brother   . Cancer Brother        COLON  . Melanoma Brother   . Colon cancer Brother   . Melanoma Brother   . Esophageal cancer Neg Hx   . Rectal cancer Neg Hx   . Stomach cancer Neg Hx     ROS: no fevers or chills, productive cough, hemoptysis, dysphasia, odynophagia, melena, hematochezia, dysuria, hematuria, rash, seizure activity, orthopnea, PND, pedal edema, claudication. Remaining systems are negative.  Physical Exam: Well-developed well-nourished in no acute distress.  Skin is warm and dry.  HEENT is normal.  Neck is supple.  Chest is clear to auscultation with normal expansion.  Cardiovascular exam is regular rate and rhythm.  Abdominal exam nontender or distended. No masses palpated. Extremities show no edema. neuro grossly intact  ECG- personally reviewed  A/P  1 mild coronary artery disease-continue aspirin 81 mg daily.  She is intolerant to statins.  2  hypertension-patient's blood pressure is controlled today.  Continue present medical regimen.  3 history of chest pain-she has not had recurrences.  Plan to continue medical therapy.  4 hyperlipidemia-patient intolerant to statins.  Continue Zetia.  Check lipids and liver.  Kirk Ruths, MD

## 2019-12-14 ENCOUNTER — Ambulatory Visit: Payer: PPO | Admitting: Cardiology

## 2019-12-24 DIAGNOSIS — F4321 Adjustment disorder with depressed mood: Secondary | ICD-10-CM | POA: Diagnosis not present

## 2020-01-22 DIAGNOSIS — F325 Major depressive disorder, single episode, in full remission: Secondary | ICD-10-CM

## 2020-01-22 HISTORY — DX: Major depressive disorder, single episode, in full remission: F32.5

## 2020-01-25 DIAGNOSIS — G894 Chronic pain syndrome: Secondary | ICD-10-CM | POA: Diagnosis not present

## 2020-01-26 DIAGNOSIS — Z85828 Personal history of other malignant neoplasm of skin: Secondary | ICD-10-CM | POA: Diagnosis not present

## 2020-01-26 DIAGNOSIS — D489 Neoplasm of uncertain behavior, unspecified: Secondary | ICD-10-CM | POA: Diagnosis not present

## 2020-01-26 DIAGNOSIS — L821 Other seborrheic keratosis: Secondary | ICD-10-CM | POA: Diagnosis not present

## 2020-01-26 DIAGNOSIS — C44712 Basal cell carcinoma of skin of right lower limb, including hip: Secondary | ICD-10-CM | POA: Diagnosis not present

## 2020-02-22 DIAGNOSIS — F325 Major depressive disorder, single episode, in full remission: Secondary | ICD-10-CM | POA: Diagnosis not present

## 2020-02-22 DIAGNOSIS — F419 Anxiety disorder, unspecified: Secondary | ICD-10-CM | POA: Diagnosis not present

## 2020-03-08 ENCOUNTER — Telehealth: Payer: Self-pay

## 2020-03-08 NOTE — Telephone Encounter (Signed)
NOTES ON FILE FROM  [facility] [phone] SENT REFERRAL TO [place]

## 2020-03-21 ENCOUNTER — Telehealth: Payer: Self-pay | Admitting: Cardiology

## 2020-03-21 NOTE — Telephone Encounter (Signed)
Dr Trinda Pascal

## 2020-03-21 NOTE — Telephone Encounter (Signed)
Patient is requesting to switch to a provider at the [facility] office she would like to know if you, Dr. Jens Som, have any recommendations. Please advise.

## 2020-03-21 NOTE — Telephone Encounter (Signed)
Happy to see her. Thank you so much for the referral.

## 2020-03-21 NOTE — Telephone Encounter (Signed)
Spoke with pt, aware will forward this message to dr Shari Prows for agreement that she will see the patient. Will be back in contact with the patient to schedule once we hear from dr Shari Prows.

## 2020-04-13 NOTE — Progress Notes (Signed)
Cardiology Office Note:    Date:  04/18/2020   ID:  Amanda [name], DOB 1945-12-30, MRN [id number]  PCP:  [name], [name], NP  [facility] Cardiologist:  Freada Bergeron, MD  [facility] Electrophysiologist:  None   Referring MD: [name], [name]*    History of Present Illness:    Amanda [name] is a 75 y.o. female with a hx of HTN, HLD, and known CAD (cath 2015 with 70% non-dominant RCA and recent CTA with prox LAD mild <50% stenosis) who presents to follow-up for shortness of breath.  Patient was previously followed by Dr. [name]. Last seen 10/23/2018. Stress echocardiogram October 2015 showed chest pain, electocardiographic changes and possible anteroapical wall motion abnormality. Cardiac catheterization November 2015 showed a 70% small nondominant right coronary artery and an ejection fraction of 55-65%. Medical therapy recommended. Nuclear study December 2019 showed ejection fraction 62% poor quality study question small apical infarct and mild inferobasilar ischemia.  Coronary CTA January 2020 showed calcium score 53 and mild stenosis proximal LAD.  Treated medically.   Today, the patient states she feels like she has been having spells where she needs to stop and catch her breath. Can occur both at rest and with exertion. No associated dizziness, chest pain, nausea, vomiting, orthopnea, or PND. No history of COVID and is fully vaccinated. She is active and is able to walk without issues. No LE edema. Has some peripheral neuropathy and has been taking gabapentin. Coronary CTA in 2020 without obstructive disease. Myoview 2019 with no ischemia and normal EF. Symptoms notably have been worsening since the loss of her husband in September.  Past Medical History:  Diagnosis Date  . Arthritis   . Back pain   . CAD (coronary artery disease)    a. 01/2014: cath showing 70% stenosis of non-dominant RCA --> medically managed.   . Cataract    bilat removed  .  Diverticulosis   . GERD (gastroesophageal reflux disease)   . Hemorrhoids   . Hiatal hernia   . Hyperlipidemia   . Hypertension   . Melanoma ([place])   . Orbital fracture (HCC)    right  . Osteopenia 07/2015   T score -2.0 FRAX 10%/1.4%  . Pituitary tumor   . Stricture of esophagus   . Vitamin D deficiency     Past Surgical History:  Procedure Laterality Date  . CERVICAL DISC SURGERY     x 2  . COLONOSCOPY    . EXCISION OF SKIN CANCER     Melanoma  . FOOT SURGERY    . HEMORRHOID BANDING     X3  . LEFT HEART CATHETERIZATION WITH CORONARY ANGIOGRAM N/A 02/03/2014   Procedure: LEFT HEART CATHETERIZATION WITH CORONARY ANGIOGRAM;  Surgeon: [name], MD;  Location: [facility] CATH LAB;  Service: Cardiovascular;  Laterality: N/A;  . LUMBAR [place] SURGERY  2018  . OOPHORECTOMY  2012   BSO  . PELVIC LAPAROSCOPY  2012   Diag Lap-BSO-lysis of adhesions  . TUBAL LIGATION    . VAGINAL HYSTERECTOMY  1993    Current Medications: Current Meds  Medication Sig  . aspirin EC 81 MG tablet Take 1 tablet (81 mg total) by mouth daily.  . Cholecalciferol (VITAMIN D PO) Take 1 tablet by mouth daily.   Marland Kitchen gabapentin (NEURONTIN) 300 MG capsule Take 1 capsule (300 mg total) by mouth 3 (three) times daily as needed.  Marland Kitchen HYDROcodone-acetaminophen (NORCO) 10-325 MG tablet hydrocodone 10 mg-acetaminophen 325 mg tablet  TAKE ONE TABLET BY MOUTH 4 TIMES DAILY AS NEEDED  . LORazepam (ATIVAN) 0.5 MG tablet lorazepam 0.5 mg tablet  Take 1 tablet twice daily as needed for anxiety  . omeprazole (PRILOSEC) 40 MG capsule Take 1 capsule by mouth every day  . QUEtiapine (SEROQUEL) 25 MG tablet Take 1 tablet (25 mg total) by mouth at bedtime.  . sertraline (ZOLOFT) 50 MG tablet Take 50 mg by mouth daily.  . traMADol (ULTRAM) 50 MG tablet Take 50 mg by mouth every 6 (six) hours as needed. for pain  . [DISCONTINUED] atenolol (TENORMIN) 25 MG tablet Take 25 mg by mouth daily.   . [DISCONTINUED] losartan (COZAAR) 25 MG  tablet Take 1 tablet (25 mg total) by mouth daily.     Allergies:   Ciprofloxacin, Lisinopril, Pneumococcal vaccine, Pravastatin, and Amlodipine   Social History   Socioeconomic History  . Marital status: Married    Spouse name: Not on file  . Number of children: 3  . Years of education: Not on file  . Highest education level: Not on file  Occupational History  . Occupation: Disabled  Tobacco Use  . Smoking status: Former Smoker    Quit date: 03/19/1994    Years since quitting: 26.1  . Smokeless tobacco: Never Used  Vaping Use  . Vaping Use: Never used  Substance and Sexual Activity  . Alcohol use: No    Alcohol/week: 0.0 standard drinks  . Drug use: No  . Sexual activity: Not Currently    Birth control/protection: Surgical    Comment: 1st intercourse 75 yo-Fewer than 5 partners,des neg  Other Topics Concern  . Not on file  Social History Narrative  . Not on file   Social Determinants of Health   Financial Resource Strain: Not on file  Food Insecurity: Not on file  Transportation Needs: Not on file  Physical Activity: Not on file  Stress: Not on file  Social Connections: Not on file     Family History: The patient's family history includes Cancer in her brother and father; Colon cancer in her brother, father, and sister; Colon polyps in her brother, father, and sister; Diabetes in her brother and sister; Heart disease in her brother, father, mother, and sister; Hypertension in her father and mother; Melanoma in her brother, brother, and sister; Stroke in her mother. There is no history of Esophageal cancer, Rectal cancer, or Stomach cancer.  ROS:   Please see the history of present illness.    Review of Systems  Constitutional: Negative for chills and fever.  HENT: Negative for congestion.   Eyes: Negative for blurred vision.  Respiratory: Positive for shortness of breath.   Cardiovascular: Negative for chest pain, palpitations, orthopnea, claudication, leg  swelling and PND.  Gastrointestinal: Negative for melena, nausea and vomiting.  Genitourinary: Negative for hematuria.  Musculoskeletal: Negative for falls.  Neurological: Negative for dizziness and loss of consciousness.  Psychiatric/Behavioral: Positive for depression. The patient has insomnia.     EKGs/Labs/Other Studies Reviewed:    The following studies were reviewed today: Myoview 2019: Myocardial perfusion is abnormal.  Findings consistent with ischemia and prior myocardial infarction.  This is an intermediate risk study.  Overall left ventricular systolic function was normal.    LV cavity size is mildly enlarged.  Nuclear stress EF: 62%.  The left ventricular ejection fraction is normal (55-65%).   There is no prior study for comparison.  Coronary CTA 03/2018: FINDINGS: Non-cardiac: See separate report from [facility].  Pulmonary veins  drain normally to the left atrium.  Calcium Score: 53 Agatston units.  Coronary Arteries: Left dominant with no anomalies  LM: Short, no plaque or stenosis.  LAD system: Mixed plaque in the proximal LAD with mild (<50%) stenosis.  Circumflex system: No plaque or stenosis.  RCA system: Small, nondominant vessel.  No plaque or stenosis.  IMPRESSION: 1.Coronary artery calcium score 53 Agatston units. This places the patient in the 64th percentile for age and gender, suggesting intermediate risk for future cardiac events.  2.  Nonobstructive disease in the proximal LAD.   EKG:  EKG is  ordered today.  The ekg ordered today demonstrates sinus bradycardia with HR 52  Recent Labs: 07/11/2019: ALT 21; BUN 8; Creatinine, Ser 0.84; Hemoglobin 13.3; Platelets 220; Potassium 3.8; Sodium 140  Recent Lipid Panel    Component Value Date/Time   CHOL 184 10/23/2018 0823   TRIG 158 (H) 10/23/2018 0823   HDL 43 10/23/2018 0823   CHOLHDL 4.3 10/23/2018 0823   LDLCALC 109 (H) 10/23/2018 0823      Physical Exam:    VS:  BP  130/86   Pulse (!) 52   Ht 5\' 5"  (1.651 m)   Wt 151 lb 9.6 oz (68.8 kg)   SpO2 97%   BMI 25.23 kg/m     Wt Readings from Last 3 Encounters:  04/18/20 151 lb 9.6 oz (68.8 kg)  07/28/19 157 lb (71.2 kg)  01/26/19 155 lb (70.3 kg)     GEN:  Well nourished, well developed in no acute distress HEENT: Normal NECK: No JVD; No carotid bruits CARDIAC: Bradycardic, regular, 2/6 systolic murmur at RUSB. No rubs, gallops RESPIRATORY:  Clear to auscultation without rales, wheezing or rhonchi  ABDOMEN: Soft, non-tender, non-distended MUSCULOSKELETAL:  No edema; No deformity  SKIN: Warm and dry NEUROLOGIC:  Alert and oriented x 3 PSYCHIATRIC:  Normal affect   ASSESSMENT:    1. Shortness of breath   2. Essential hypertension   3. Murmur   4. Hypercholesterolemia   5. Coronary artery disease involving native coronary artery of native heart without angina pectoris   6. Primary hypertension    PLAN:    In order of problems listed above:  #Shortness of breath: Patient with episodes where she feels the need to "take a deep breath in." Can occur with exertion or at rest. No associated chest pain, dizziness, lightheadedness, diaphoresis, or nausea. Cardiac work-up including coronary CTA and myoview reassuringly normal. Symptoms have been worsening since the loss of her husband. Suspect they may be related to depression/anixety, but the patient is also braycardiac with HR 52 which may make her feel more SOB. Will stop atenolol and double losartan at this time. -Stop atenolol -Increase losartan to 50mg  daily  #Cardiac Murmur: 2/6 systolic murmur on exam. Normal EF on myoview but no recent TTE. -Check TTE  #Mild non-obstructive CAD: CTA coronaries with mild <50% LAD disease; small non-dominant RCA. Calcium score 53 (64% for age and gender controls).  -Continue ASA 81mg  daily -Consider statin at next visit in 3 months  #HTN: Well controlled.  -Stop atenolol due to bradycardia as  above -Increase losartan to 50mg  daily -Check BMET in 1 week  #HLD: Last LDL 109. History of intolerance to pravastatin. -Statin vs zetia at next visit in 3 months    Medication Adjustments/Labs and Tests Ordered: Current medicines are reviewed at length with the patient today.  Concerns regarding medicines are outlined above.  Orders Placed This Encounter  Procedures  .  Basic metabolic panel  . EKG 12-Lead  . ECHOCARDIOGRAM COMPLETE   Meds ordered this encounter  Medications  . losartan (COZAAR) 50 MG tablet    Sig: Take 1 tablet (50 mg total) by mouth daily.    Dispense:  90 tablet    Refill:  3    Patient Instructions  Medication Instructions:  1) STOP ATENOLOL  2) INCREASE LOSARTAN to 50 mg daily  *If you need a refill on your cardiac medications before your next appointment, please call your pharmacy*  Lab Work: Your provider recommends that you return for lab work in 1 week. You do NOT need to be fasting. You can come any time on the day of your appointment between 7:30AM and 4:30PM. If you have labs (blood work) drawn today and your tests are completely normal, you will receive your results only by: Marland Kitchen MyChart Message (if you have MyChart) OR . A paper copy in the mail If you have any lab test that is abnormal or we need to change your treatment, we will call you to review the results.  Testing/Procedures: Your provider has requested that you have an echocardiogram. Echocardiography is a painless test that uses sound waves to create images of your heart. It provides your doctor with information about the size and shape of your heart and how well your heart's chambers and valves are working. This procedure takes approximately one hour. There are no restrictions for this procedure.  Follow-Up: At [facility], you and your health needs are our priority.  As part of our continuing mission to provide you with exceptional heart care, we have created designated Provider  Care Teams.  These Care Teams include your primary Cardiologist (physician) and Advanced Practice Providers (APPs -  Physician Assistants and Nurse Practitioners) who all work together to provide you with the care you need, when you need it. Your next appointment:   3 month(s) The format for your next appointment:   In Person Provider:   You may see Freada Bergeron, MD or one of the following Advanced Practice Providers on your designated Care Team:    Richardson Dopp, PA-C  Robbie Lis, [place]      Signed, Freada Bergeron, MD  04/18/2020 2:00 PM    [place]

## 2020-04-18 ENCOUNTER — Encounter: Payer: Self-pay | Admitting: Cardiology

## 2020-04-18 ENCOUNTER — Ambulatory Visit: Payer: Medicare Other | Admitting: Cardiology

## 2020-04-18 ENCOUNTER — Other Ambulatory Visit: Payer: Self-pay

## 2020-04-18 VITALS — BP 130/86 | HR 52 | Ht 65.0 in | Wt 151.6 lb

## 2020-04-18 DIAGNOSIS — I10 Essential (primary) hypertension: Secondary | ICD-10-CM | POA: Diagnosis not present

## 2020-04-18 DIAGNOSIS — E7800 Pure hypercholesterolemia, unspecified: Secondary | ICD-10-CM

## 2020-04-18 DIAGNOSIS — I2510 Atherosclerotic heart disease of native coronary artery without angina pectoris: Secondary | ICD-10-CM

## 2020-04-18 DIAGNOSIS — R011 Cardiac murmur, unspecified: Secondary | ICD-10-CM

## 2020-04-18 DIAGNOSIS — R0602 Shortness of breath: Secondary | ICD-10-CM | POA: Diagnosis not present

## 2020-04-18 MED ORDER — LOSARTAN POTASSIUM 50 MG PO TABS: 50.0000 mg | ORAL_TABLET | Freq: Every day | ORAL | 3 refills | Status: DC

## 2020-04-18 NOTE — Patient Instructions (Signed)
Medication Instructions:  1) STOP ATENOLOL  2) INCREASE LOSARTAN to 50 mg daily  *If you need a refill on your cardiac medications before your next appointment, please call your pharmacy*  Lab Work: Your provider recommends that you return for lab work in 1 week. You do NOT need to be fasting. You can come any time on the day of your appointment between 7:30AM and 4:30PM. If you have labs (blood work) drawn today and your tests are completely normal, you will receive your results only by: Marland Kitchen MyChart Message (if you have MyChart) OR . A paper copy in the mail If you have any lab test that is abnormal or we need to change your treatment, we will call you to review the results.  Testing/Procedures: Your provider has requested that you have an echocardiogram. Echocardiography is a painless test that uses sound waves to create images of your heart. It provides your doctor with information about the size and shape of your heart and how well your heart's chambers and valves are working. This procedure takes approximately one hour. There are no restrictions for this procedure.  Follow-Up: At [facility], you and your health needs are our priority.  As part of our continuing mission to provide you with exceptional heart care, we have created designated Provider Care Teams.  These Care Teams include your primary Cardiologist (physician) and Advanced Practice Providers (APPs -  Physician Assistants and Nurse Practitioners) who all work together to provide you with the care you need, when you need it. Your next appointment:   3 month(s) The format for your next appointment:   In Person Provider:   You may see Freada Bergeron, MD or one of the following Advanced Practice Providers on your designated Care Team:    Richardson Dopp, PA-C  Vin Dike, [place]

## 2020-04-22 ENCOUNTER — Telehealth: Payer: Self-pay | Admitting: Cardiology

## 2020-04-22 DIAGNOSIS — I10 Essential (primary) hypertension: Secondary | ICD-10-CM

## 2020-04-22 NOTE — Telephone Encounter (Signed)
Pt aware of recommendations and will take 4 tabs of the 25 mg Losartan until gone and will pick up the 50 mg and take 2 if tolerates this  will call back and will call in the 100 mg at that time ./cy

## 2020-04-22 NOTE — Telephone Encounter (Signed)
Pt c/o BP issue: STAT if pt c/o blurred vision, one-sided weakness or slurred speech  1. What are your last 5 BP readings?  04/21/20 143/87 HR 70 (AM before meds) 118/74 HR 81 (11:00 am after meds) 133/74 HR 72 (12:00 PM) 142/83 HR 69 (6:00 PM) 150/104 HR 83 (8:30 PM) 119/78 HR 78 (9:30 PM after taking 1 tablet of atenolol 25 MG's) 04/22/20 152/86 HR 69 (before meds)  2. Are you having any other symptoms (ex. Dizziness, headache, blurred vision, passed out)? Real jerky and nervous like fatigue, and feels bloated.   3. What is your BP issue? Tarrin is calling to report her recent BP readings requesting a callback from a nurse with Dr. Jacolyn Reedy recommendations.

## 2020-04-22 NOTE — Telephone Encounter (Signed)
Spoke with pt and noted on Wed having this nervous feeling "jerky sensation and also some bloating " and again yesterday  See B/P readings below Per pt the 150/104 at 8:30 only complaint was feeling hot  Per pt daughter stated pt's face red no other symptoms .Will forward to Dr Johney Frame for review and recommendations ./cy

## 2020-04-22 NOTE — Telephone Encounter (Signed)
Her blood pressure is still a little too high. This should not make her feel nervous or bloated but may make her feel tired with lower energy. Can we start her on amlodipine 2.5mg  daily? If her symptoms of jitteriness/bloating are still going on, she should see her PCP for further management.  Gwyndolyn Kaufman, MD

## 2020-04-22 NOTE — Addendum Note (Signed)
Addended by: Devra Dopp E on: 04/22/2020 04:18 PM   Modules accepted: Orders

## 2020-04-22 NOTE — Telephone Encounter (Signed)
Per pt Amlodipine caused swelling did not tolerate . Will let Dr Johney Frame know/cy

## 2020-04-22 NOTE — Telephone Encounter (Signed)
No problem. Let's just increase her losartan to 100mg  daily. Realest BMET in 1 week.

## 2020-04-28 ENCOUNTER — Other Ambulatory Visit: Payer: Self-pay | Admitting: Cardiology

## 2020-04-29 ENCOUNTER — Telehealth: Payer: Self-pay

## 2020-04-29 LAB — BASIC METABOLIC PANEL
BUN/Creatinine Ratio: 10 — ABNORMAL LOW (ref 12–28)
BUN: 8 mg/dL (ref 8–27)
CO2: 22 mmol/L (ref 20–29)
Calcium: 7.8 mg/dL — ABNORMAL LOW (ref 8.7–10.3)
Chloride: 103 mmol/L (ref 96–106)
Creatinine, Ser: 0.81 mg/dL (ref 0.57–1.00)
GFR calc Af Amer: 84 mL/min/{1.73_m2} (ref >59)
GFR calc non Af Amer: 73 mL/min/{1.73_m2} (ref >59)
Glucose: 86 mg/dL (ref 65–99)
Potassium: 3.9 mmol/L (ref 3.5–5.2)
Sodium: 140 mmol/L (ref 134–144)

## 2020-04-29 NOTE — Telephone Encounter (Signed)
RN called patient and discussed results: Electrolytes and kidney function look great. How is the blood pressure doing on the higher dose of the losartan; pt stated her b/p yesterday after taking medication was 152/86, HR 69 Today: 146/68, 65  Patient stated she was doing ok with the new medication except for a nagging headache. Pt denied any other symptoms like dizziness or blurry vision.  RN stated if she had any other concerns or questions to call us back, and I would given MD her b/p numbers.

## 2020-04-29 NOTE — Telephone Encounter (Signed)
Blood pressure is still a little high. Can we start her on low dose hydrochlorothiazide 12.5mg  daily and see if this helps. She just needs to be sure to hydrate on this medications as it is a diuretic.

## 2020-04-29 NOTE — Telephone Encounter (Signed)
-----   Message from Freada Bergeron, MD sent at 04/29/2020  1:22 PM EST ----- Electrolytes and kidney function look great. How is the blood pressure doing on the higher dose of the losartan?

## 2020-05-03 ENCOUNTER — Telehealth: Payer: Self-pay | Admitting: Cardiology

## 2020-05-03 DIAGNOSIS — I10 Essential (primary) hypertension: Secondary | ICD-10-CM

## 2020-05-03 MED ORDER — HYDROCHLOROTHIAZIDE 12.5 MG PO CAPS
12.5000 mg | ORAL_CAPSULE | Freq: Every day | ORAL | 3 refills | Status: DC
Start: 2020-05-03 — End: 2020-07-04

## 2020-05-03 NOTE — Telephone Encounter (Addendum)
Spoke with Amanda [name] and B/P still running high. Reviewed Amanda [name]'s chart and appears Dr Johney Frame on 04/29/20 also added HCTZ 12.5 mg daily This had not been called to Amanda [name] as of yet Amanda [name] aware will send this in to drug store and will forward messge to Dr Johney Frame for review  B/P readings below prior to starting HCTZ ./cy

## 2020-05-03 NOTE — Telephone Encounter (Signed)
    Pt c/o BP issue: STAT if pt c/o blurred vision, one-sided weakness or slurred speech  1. What are your last 5 BP readings?  AM 165/104 left arm 182/101 right arm Pt took losartan 100 mg waited couple og hours BP 155/85  2. Are you having any other symptoms (ex. Dizziness, headache, blurred vision, passed out)?   3. What is your BP issue? Pt said her BP been high and feels like her meds is not working, she also said she had a shot on her back last Saturday and might causing her BP to elevate

## 2020-05-06 ENCOUNTER — Other Ambulatory Visit: Payer: Self-pay

## 2020-05-06 ENCOUNTER — Ambulatory Visit (HOSPITAL_COMMUNITY): Payer: Medicare Other | Attending: Internal Medicine

## 2020-05-06 DIAGNOSIS — R011 Cardiac murmur, unspecified: Secondary | ICD-10-CM | POA: Insufficient documentation

## 2020-05-06 DIAGNOSIS — I10 Essential (primary) hypertension: Secondary | ICD-10-CM | POA: Diagnosis present

## 2020-05-06 LAB — ECHOCARDIOGRAM COMPLETE
AR max vel: 2.68 cm2
AV Area VTI: 3.16 cm2
AV Area mean vel: 2.58 cm2
AV Mean grad: 5 mmHg
AV Peak grad: 10.4 mmHg
Ao pk vel: 1.61 m/s
Area-P 1/2: 3.48 cm2
S' Lateral: 2.9 cm

## 2020-05-07 ENCOUNTER — Emergency Department (HOSPITAL_COMMUNITY): Payer: Medicare Other

## 2020-05-07 ENCOUNTER — Encounter (HOSPITAL_COMMUNITY): Payer: Self-pay

## 2020-05-07 ENCOUNTER — Other Ambulatory Visit: Payer: Self-pay

## 2020-05-07 ENCOUNTER — Emergency Department (HOSPITAL_COMMUNITY)
Admission: EM | Admit: 2020-05-07 | Discharge: 2020-05-08 | Disposition: A | Discharge status: Home / Self Care | Payer: Medicare Other | Attending: Emergency Medicine | Admitting: Emergency Medicine

## 2020-05-07 DIAGNOSIS — Y9222 Religious institution as the place of occurrence of the external cause: Secondary | ICD-10-CM | POA: Diagnosis not present

## 2020-05-07 DIAGNOSIS — E785 Hyperlipidemia, unspecified: Secondary | ICD-10-CM | POA: Diagnosis not present

## 2020-05-07 DIAGNOSIS — M546 Pain in thoracic spine: Secondary | ICD-10-CM | POA: Diagnosis not present

## 2020-05-07 DIAGNOSIS — S80212A Abrasion, left knee, initial encounter: Secondary | ICD-10-CM | POA: Insufficient documentation

## 2020-05-07 DIAGNOSIS — Z5321 Procedure and treatment not carried out due to patient leaving prior to being seen by health care provider: Secondary | ICD-10-CM | POA: Diagnosis not present

## 2020-05-07 DIAGNOSIS — W108XXA Fall (on) (from) other stairs and steps, initial encounter: Secondary | ICD-10-CM | POA: Diagnosis not present

## 2020-05-07 DIAGNOSIS — I10 Essential (primary) hypertension: Secondary | ICD-10-CM | POA: Insufficient documentation

## 2020-05-07 DIAGNOSIS — Z87891 Personal history of nicotine dependence: Secondary | ICD-10-CM | POA: Diagnosis not present

## 2020-05-07 DIAGNOSIS — S8992XA Unspecified injury of left lower leg, initial encounter: Secondary | ICD-10-CM | POA: Diagnosis present

## 2020-05-07 DIAGNOSIS — I2510 Atherosclerotic heart disease of native coronary artery without angina pectoris: Secondary | ICD-10-CM | POA: Insufficient documentation

## 2020-05-07 MED ORDER — OXYCODONE-ACETAMINOPHEN 5-325 MG PO TABS
1.0000 | ORAL_TABLET | ORAL | Status: DC | PRN
  Administered 2020-05-07: 1 via ORAL
  Filled 2020-05-07: qty 1

## 2020-05-07 NOTE — ED Triage Notes (Signed)
Patient arrived by [facility] after falling [place] steps at church today. No loc. Complains of left knee pain with abrasion to same and complains of left sided thoracic back pain. No redness nor bruising noted

## 2020-05-07 NOTE — ED Notes (Signed)
Patient educated about not driving or performing other critical tasks (such as operating heavy machinery, caring for infant/toddler/child) due to sedative nature of narcotic medications received while in the ED.  Pt/caregiver verbalized understanding.   

## 2020-05-08 ENCOUNTER — Other Ambulatory Visit: Payer: Self-pay

## 2020-05-08 ENCOUNTER — Encounter (HOSPITAL_BASED_OUTPATIENT_CLINIC_OR_DEPARTMENT_OTHER): Payer: Self-pay | Admitting: Emergency Medicine

## 2020-05-08 ENCOUNTER — Emergency Department (HOSPITAL_BASED_OUTPATIENT_CLINIC_OR_DEPARTMENT_OTHER)
Admission: EM | Admit: 2020-05-08 | Discharge: 2020-05-08 | Disposition: A | Discharge status: Home / Self Care | Payer: Medicare Other | Source: Home / Self Care | Attending: Emergency Medicine | Admitting: Emergency Medicine

## 2020-05-08 DIAGNOSIS — Z7982 Long term (current) use of aspirin: Secondary | ICD-10-CM | POA: Insufficient documentation

## 2020-05-08 DIAGNOSIS — S20212A Contusion of left front wall of thorax, initial encounter: Secondary | ICD-10-CM | POA: Insufficient documentation

## 2020-05-08 DIAGNOSIS — Z87891 Personal history of nicotine dependence: Secondary | ICD-10-CM | POA: Insufficient documentation

## 2020-05-08 DIAGNOSIS — I10 Essential (primary) hypertension: Secondary | ICD-10-CM | POA: Insufficient documentation

## 2020-05-08 DIAGNOSIS — M25522 Pain in left elbow: Secondary | ICD-10-CM | POA: Insufficient documentation

## 2020-05-08 DIAGNOSIS — M25562 Pain in left knee: Secondary | ICD-10-CM | POA: Insufficient documentation

## 2020-05-08 DIAGNOSIS — Y9222 Religious institution as the place of occurrence of the external cause: Secondary | ICD-10-CM | POA: Insufficient documentation

## 2020-05-08 DIAGNOSIS — I2510 Atherosclerotic heart disease of native coronary artery without angina pectoris: Secondary | ICD-10-CM | POA: Insufficient documentation

## 2020-05-08 DIAGNOSIS — M549 Dorsalgia, unspecified: Secondary | ICD-10-CM | POA: Insufficient documentation

## 2020-05-08 DIAGNOSIS — Z79899 Other long term (current) drug therapy: Secondary | ICD-10-CM | POA: Insufficient documentation

## 2020-05-08 DIAGNOSIS — W108XXA Fall (on) (from) other stairs and steps, initial encounter: Secondary | ICD-10-CM | POA: Insufficient documentation

## 2020-05-08 NOTE — ED Provider Notes (Signed)
[facility] EMERGENCY DEPARTMENT Provider Note   CSN: [id number] Arrival date & time: 05/08/20  1427     History Chief Complaint  Patient presents with  . Back Pain    Amanda [name] is a 75 y.o. female.  Patient is a 75 year old female with a history of hypertension, hyperlipidemia, CAD, back pain, pituitary tumor who is presenting today with complaint of left side pain.  Yesterday she was coming out of a church when she slipped on the steps and fell down 5 stairs.  She reports she kind of tumbled hitting multiple areas including her left side, elbow and knee.  She may have bumped her head minimally but denies any loss of consciousness.  She does not take any anticoagulation.  She denies any headache or neck pain.  She has had ongoing significant pain in the left ribs since the fall yesterday.  She did go to the [facility] emergency room last night and had x-rays done however the wait was too long and she was never seen by a provider.  Because she was still having pain today she just wanted an evaluation.  She has no abdominal pain.  She is having some knee pain but is able to ambulate.  She did use some Voltaren gel when she got home early this morning with some benefit.  The history is provided by the patient.  Back Pain      Past Medical History:  Diagnosis Date  . Arthritis   . Back pain   . CAD (coronary artery disease)    a. 01/2014: cath showing 70% stenosis of non-dominant RCA --> medically managed.   . Cataract    bilat removed  . Diverticulosis   . GERD (gastroesophageal reflux disease)   . Hemorrhoids   . Hiatal hernia   . Hyperlipidemia   . Hypertension   . Melanoma ([place])   . Orbital fracture (HCC)    right  . Osteopenia 07/2015   T score -2.0 FRAX 10%/1.4%  . Pituitary tumor   . Stricture of esophagus   . Vitamin D deficiency     Patient Active Problem List   Diagnosis Date Noted  . Statin intolerance 01/26/2019  . Insomnia due to other  mental disorder 12/30/2018  . Need for influenza vaccination 12/30/2018  . Chest pain 07/22/2014  . CAD (coronary artery disease) 04/01/2014  . Abnormal stress echocardiogram 01/29/2014  . Dyspnea 01/04/2014  . Essential hypertension   . Melanoma ([place])   . Stricture of esophagus   . Diverticulosis   . Vitamin D deficiency   . Hiatal hernia   . Hemorrhoids   . Arthritis   . GERD (gastroesophageal reflux disease)   . Osteopenia   . Back pain 02/05/2011  . Family history of malignant neoplasm of gastrointestinal tract 08/21/2010  . Dysphagia, unspecified(787.20) 08/21/2010  . ESOPHAGEAL REFLUX 01/11/2009    Past Surgical History:  Procedure Laterality Date  . CERVICAL DISC SURGERY     x 2  . COLONOSCOPY    . EXCISION OF SKIN CANCER     Melanoma  . FOOT SURGERY    . HEMORRHOID BANDING     X3  . LEFT HEART CATHETERIZATION WITH CORONARY ANGIOGRAM N/A 02/03/2014   Procedure: LEFT HEART CATHETERIZATION WITH CORONARY ANGIOGRAM;  Surgeon: [name], MD;  Location: [facility] CATH LAB;  Service: Cardiovascular;  Laterality: N/A;  . LUMBAR [place] SURGERY  2018  . OOPHORECTOMY  2012   BSO  . PELVIC LAPAROSCOPY  2012   Diag Lap-BSO-lysis of adhesions  . TUBAL LIGATION    . VAGINAL HYSTERECTOMY  1993     OB History    Gravida  3   Para  3   Term  3   Preterm      AB      Living  2     SAB      IAB      Ectopic      Multiple      Live Births              Family History  Problem Relation Age of Onset  . Heart disease Mother   . Hypertension Mother   . Stroke Mother   . Colon polyps Father   . Heart disease Father        CHF  . Hypertension Father   . Cancer Father        COLON  . Colon cancer Father   . Colon cancer Sister   . Colon polyps Sister   . Heart disease Sister   . Diabetes Sister   . Melanoma Sister   . Colon polyps Brother   . Diabetes Brother   . Heart disease Brother   . Cancer Brother        COLON  . Melanoma Brother   . Colon  cancer Brother   . Melanoma Brother   . Esophageal cancer Neg Hx   . Rectal cancer Neg Hx   . Stomach cancer Neg Hx     Social History   Tobacco Use  . Smoking status: Former Smoker    Quit date: 03/19/1994    Years since quitting: 26.1  . Smokeless tobacco: Never Used  Vaping Use  . Vaping Use: Never used  Substance Use Topics  . Alcohol use: No    Alcohol/week: 0.0 standard drinks  . Drug use: No    Home Medications Prior to Admission medications   Medication Sig Start Date End Date Taking? Authorizing Provider  aspirin EC 81 MG tablet Take 1 tablet (81 mg total) by mouth daily. 10/23/18   Lelon Perla, MD  Cholecalciferol (VITAMIN D PO) Take 1 tablet by mouth daily.     [provider]  gabapentin (NEURONTIN) 300 MG capsule Take 1 capsule (300 mg total) by mouth 3 (three) times daily as needed. 04/24/19   Libby Maw, MD  hydrochlorothiazide (MICROZIDE) 12.5 MG capsule Take 1 capsule (12.5 mg total) by mouth daily. 05/03/20 08/01/20  Freada Bergeron, MD  HYDROcodone-acetaminophen (NORCO) 10-325 MG tablet hydrocodone 10 mg-acetaminophen 325 mg tablet  TAKE ONE TABLET BY MOUTH 4 TIMES DAILY AS NEEDED    [provider]  LORazepam (ATIVAN) 0.5 MG tablet lorazepam 0.5 mg tablet  Take 1 tablet twice daily as needed for anxiety 02/03/20   [provider]  losartan (COZAAR) 50 MG tablet Take 1 tablet (50 mg total) by mouth daily. 04/18/20 04/13/21  Freada Bergeron, MD  omeprazole (PRILOSEC) 40 MG capsule Take 1 capsule by mouth every day 02/26/18   Doran Stabler, MD  QUEtiapine (SEROQUEL) 25 MG tablet Take 1 tablet (25 mg total) by mouth at bedtime. 01/26/19   Libby Maw, MD  sertraline (ZOLOFT) 50 MG tablet Take 50 mg by mouth daily. 02/24/20   [provider]  traMADol (ULTRAM) 50 MG tablet Take 50 mg by mouth every 6 (six) hours as needed. for pain 11/09/16   [provider]  Allergies     Ciprofloxacin, Lisinopril, Pneumococcal vaccine, Pravastatin, and Amlodipine  Review of Systems   Review of Systems  Musculoskeletal: Positive for back pain.  All other systems reviewed and are negative.   Physical Exam Updated Vital Signs BP 120/70 (BP Location: Left Arm)   Pulse 79   Temp 98.4 F (36.9 C) (Oral)   Resp 16   Ht 5\' 5"  (1.651 m)   Wt 68.8 kg   SpO2 99%   BMI 25.23 kg/m   Physical Exam Vitals and nursing note reviewed.  Constitutional:      General: She is not in acute distress.    Appearance: Normal appearance. She is well-developed, normal weight and well-nourished.  HENT:     Head: Normocephalic and atraumatic.  Eyes:     Extraocular Movements: EOM normal.     Pupils: Pupils are equal, round, and reactive to light.  Cardiovascular:     Rate and Rhythm: Normal rate and regular rhythm.     Pulses: Intact distal pulses.     Heart sounds: Normal heart sounds. No murmur heard. No friction rub.  Pulmonary:     Effort: Pulmonary effort is normal.     Breath sounds: Normal breath sounds. No wheezing or rales.  Abdominal:     General: Bowel sounds are normal. There is no distension.     Palpations: Abdomen is soft.     Tenderness: There is no abdominal tenderness. There is no guarding or rebound.  Musculoskeletal:        General: Tenderness present. Normal range of motion.       Arms:     Cervical back: Normal range of motion and neck supple. No tenderness. No spinous process tenderness or muscular tenderness.     Thoracic back: Normal.     Lumbar back: Normal.       Back:     Right lower leg: No edema.     Left lower leg: No edema.       Legs:     Comments: No edema.  No midline spine tenderness  Skin:    General: Skin is warm and dry.     Findings: No rash.  Neurological:     General: No focal deficit present.     Mental Status: She is alert and oriented to person, place, and time. Mental status is at baseline.     Cranial Nerves: No cranial  nerve deficit.     Sensory: No sensory deficit.     Motor: No weakness.  Psychiatric:        Mood and Affect: Mood and affect and mood normal.        Behavior: Behavior normal.        Thought Content: Thought content normal.     ED Results / Procedures / Treatments   Labs (all labs ordered are listed, but only abnormal results are displayed) Labs Reviewed - No data to display  EKG None  Radiology DG Ribs Unilateral W/Chest Left  Result Date: 05/07/2020 CLINICAL DATA:  Status post fall. EXAM: LEFT RIBS AND CHEST - 3+ VIEW COMPARISON:  None. FINDINGS: No fracture or other bone lesions are seen involving the ribs. A radiopaque fusion plate and screws are seen overlying the lower cervical spine. There is no evidence of pneumothorax or pleural effusion. Both lungs are clear. Heart size and mediastinal contours are within normal limits. IMPRESSION: No acute osseous abnormality. Electronically Signed   By: Virgina Norfolk M.D.   On: 05/07/2020  22:32    Procedures Procedures   Medications Ordered in ED Medications - No data to display  ED Course  I have reviewed the triage vital signs and the nursing notes.  Pertinent labs & imaging results that were available during my care of the patient were reviewed by me and considered in my medical decision making (see chart for details).    MDM Rules/Calculators/A&P                          Patient presenting today after falling down the steps yesterday.  She has ongoing pain in her left rib cage.  She was seen at [facility] last night and had images done but was never able to see a provider.  It is painful when she breathes but she has no significant shortness of breath.  She satting 99% on room air.  She does have tenderness with palpation along the lower left rib cage but no CVA tenderness or abdominal tenderness.  She has no midline back tenderness concerning for compression fractures and did not have significant head injury or loss of  consciousness.  She does not take any anticoagulation at this time.  X-rays from yesterday were negative.  Suspect she has rib contusion.  Findings discussed with the patient and her family.  She will continue to use Voltaren gel and try Tylenol.  Final Clinical Impression(s) / ED Diagnoses Final diagnoses:  Rib contusion, left, initial encounter    Rx / DC Orders ED Discharge Orders    None       Blanchie Dessert, MD 05/08/20 1538

## 2020-05-08 NOTE — ED Notes (Signed)
Patient stated she wanted to leave. Daughter present. Patient assisted into vehicle and has left.

## 2020-05-08 NOTE — ED Triage Notes (Addendum)
Pt fell yesterday continues to have symptoms of left knee pain and left side/back pain pain. Pt also request that her arms be checked out as well.

## 2020-05-08 NOTE — Discharge Instructions (Signed)
The x-rays today are normal without signs of fracture and the ribs are most likely just bruised.  Hopefully in 2-3 days you will be feeling normal.  Use the voltaren gel and 2 extra strength tylenol every 6 hours for pain.  Avoid lifting.

## 2020-05-12 NOTE — Telephone Encounter (Signed)
Amanda Campbell, LPN     7/93/96 8:86 PM Note   Spoke with pt and B/P still running high. Reviewed pt's chart and appears Dr Johney Frame on 04/29/20 also added HCTZ 12.5 mg daily This had not been called to pt as of yet Pt aware will send this in to drug store and will forward messge to Dr Johney Frame for review  B/P readings below prior to starting HCTZ ./cy

## 2020-06-06 DIAGNOSIS — C44712 Basal cell carcinoma of skin of right lower limb, including hip: Secondary | ICD-10-CM

## 2020-06-06 HISTORY — DX: Basal cell carcinoma of skin of right lower limb, including hip: C44.712

## 2020-06-17 ENCOUNTER — Telehealth: Payer: Self-pay | Admitting: Cardiology

## 2020-06-17 NOTE — Telephone Encounter (Signed)
Pt c/o BP issue: STAT if pt c/o blurred vision, one-sided weakness or slurred speech  1. What are your last 5 BP readings?   04/01: 111/79 81 (before taking medication)  2. Are you having any other symptoms (ex. Dizziness, headache, blurred vision, passed out)?  No, patient states she is asymptomatic  3. What is your BP issue?   Patient is following up regarding previously elevated BP. She states her BP has been fluctuating now. Sometimes it gets down really low. She is concerned that if she continues to take Losartan 100 MG, it will drop too low. She would like to discuss how she needs to take her Losartan.

## 2020-06-17 NOTE — Telephone Encounter (Signed)
Spoke with the patient who states that she has been taking her losartan 50 mg twice daily instead of 100mg  once daily. She states that sometimes when she gets up her blood pressure is low. She denies any dizziness.  Recent blood pressure readings: 111/79, 115/82, 114/75, 120/73, 111/87, 137/91, 130/83, 126/78 Patient needs a refill on losartan and wants to know if its okay for her to keep taking the losartan 50 gm twice daily as she thinks taking 100mg  at once will drop her blood pressure too low.

## 2020-06-20 ENCOUNTER — Telehealth: Payer: Self-pay | Admitting: Cardiology

## 2020-06-20 MED ORDER — LOSARTAN POTASSIUM 100 MG PO TABS
100.0000 mg | ORAL_TABLET | Freq: Every day | ORAL | 1 refills | Status: DC
Start: 2020-06-20 — End: 2020-07-04

## 2020-06-20 NOTE — Telephone Encounter (Signed)
Called the pt and went over her medication regimen with her, verbatim.  She states she is taking losartan 50 mg po bid and HCTZ 12.5 mg po daily. Informed the pt that per Dr. Johney Frame, as copied from previous telephone encounters below, she should be taking losartan 100 mg po daily, HCTZ 12.5 mg po daily, continue monitoring her BP and logging this, hydrate well, and come in for bmet, which she did on 2/10.  Informed the pt that I will call in the correct dose of losartan 100 mg tablets to her pharmacy, and she should start taking this once daily, as well as her HCTZ 12.5 mg po daily, monitor her BP once a day, and report any concerning values to our office as needed.  Pt education provided on how to correctly take her BP one hour after med administration, and what BP parameters that are of concerns if she experiences them, so that she can call the office to report as needed.  Pt verbalized understanding and agrees with this plan.        Freada Bergeron, MD   HP  2:23 PM Note   Blood pressure is still a little high. Can we start her on low dose hydrochlorothiazide 12.5mg  daily and see if this helps. She just needs to be sure to hydrate on this medications as it is a diuretic.        Telephone  04/22/2020 [facility]  Freada Bergeron, MD  Cardiology  Essential hypertension  Dx  Hypertension  Reason for call    Conversation: Hypertension (Newest Message First)  [place] Campbell, LPN     8/0/03 4:91 PM Note   Addended by: Devra Dopp E on: 04/22/2020 04:18 PM   Modules accepted: Orders      [place] Justin, LPN     09/24/13 0:56 PM Note   Pt aware of recommendations and will take 4 tabs of the 25 mg Losartan until gone and will pick up the 50 mg and take 2 if tolerates this  will call back and will call in the 100 mg at that time .Orson Gear, MD to Richmond Campbell, LPN   HP  11/23/92 8:01 PM Note    No problem. Let's just increase her losartan to 100mg  daily. [facility] BMET in 1 week.

## 2020-06-20 NOTE — Telephone Encounter (Signed)
  Called the pt and went over her medication regimen with her, verbatim.  She states she is taking losartan 50 mg po bid and HCTZ 12.5 mg po daily. Informed the pt that per Dr. Johney Frame, as copied from previous telephone encounters below, she should be taking losartan 100 mg po daily, HCTZ 12.5 mg po daily, continue monitoring her BP and logging this, hydrate well, and come in for bmet, which she did on 2/10.  Informed the pt that I will call in the correct dose of losartan 100 mg tablets to her pharmacy, and she should start taking this once daily, as well as her HCTZ 12.5 mg po daily, monitor her BP once a day, and report any concerning values to our office as needed.  Pt education provided on how to correctly take her BP one hour after med administration, and what BP parameters that are of concerns if she experiences them, so that she can call the office to report as needed.  Pt verbalized understanding and agrees with this plan.        Freada Bergeron, MD   HP  2:23 PM Note   Blood pressure is still a little high. Can we start her on low dose hydrochlorothiazide 12.5mg  daily and see if this helps. She just needs to be sure to hydrate on this medications as it is a diuretic.        Telephone  04/22/2020 [facility]  Freada Bergeron, MD  Cardiology  Essential hypertension  Dx  Hypertension  Reason for call    Conversation: Hypertension (Newest Message First)  Richmond Campbell, LPN     11/17/45 8:29 PM Note   Addended by: Devra Dopp E on: 04/22/2020 04:18 PM   Modules accepted: Orders      [place] Justin, LPN     07/23/19 3:08 PM Note   Pt aware of recommendations and will take 4 tabs of the 25 mg Losartan until gone and will pick up the 50 mg and take 2 if tolerates this  will call back and will call in the 100 mg at that time .Orson Gear, MD to [place] Campbell, LPN   HP  08/21/76 4:69 PM Note    No problem. Let's just increase her losartan to 100mg  daily. [facility] BMET in 1 week.

## 2020-06-20 NOTE — Telephone Encounter (Signed)
Pt c/o medication issue:  1. Name of Medication: losartan (COZAAR) 50 MG tablet  2. How are you currently taking this medication (dosage and times per day)?  3. Are you having a reaction (difficulty breathing--STAT)? No  4. What is your medication issue?Mel Almond from [facility] states she will need a new prescription because  Pt was told to take the med 2x per day and that is not how it was prescribed. Mel Almond also states she will need a new script sent to the pharmacy  [place], [place]

## 2020-06-27 NOTE — Progress Notes (Signed)
Cardiology Office Note:    Date:  07/04/2020   ID:  Amanda [name], DOB 22-Feb-1946, MRN [id number]  PCP:  [name], [name], NP   [facility]  Cardiologist:  Freada Bergeron, MD  Advanced Practice Provider:  No care team member to display Electrophysiologist:  None    Referring MD: [name], [name]*     History of Present Illness:    Amanda [name] is a 75 y.o. female with a hx of HTN, HLD, and known CAD (cath 2015 with 70% non-dominant RCA and recent CTA with prox LAD mild <50% stenosis) who was previously followed by Dr. [name] who now presents to clinic for follow-up.  Patient underwent stress echocardiogram October 2015 for chest pain found to have electocardiographic changes and possible anteroapical wall motion abnormality. Cardiac catheterization November 2015 showed a 70% small nondominant right coronary artery and an ejection fraction of 55-65%. Medical therapy recommended. Nuclear study December 2019 showed ejection fraction 62% poor quality study question small apical infarct and mild inferobasilar ischemia. Coronary CTA January 2020 showed calcium score 53 and mild stenosis proximal LAD. Treated medically.  During our last visit on 04/18/20 where she was having episodes of shortness of breath. She was notably bradycardic during her visit with HR 52. We stopped her atenolol at that time. Symptoms also correlated with the recent loss of her husband. TTE was also obtained due to systolic murmur, which showed LVEF 60-65%, G1DD, normal RV, mildly dilated ascending aorta.  Today, the patient feels well. Denies any further shortness of breath. No chest pain, nausea, vomiting, PND, or LE edema. Has some left leg pain with ambulation that feels like shin splints, but no claudication or LE wounds. Has been logging blood pressure which is mainly 120/80s in the AM but rises in the afternoon/evening to 130-150s.   Past Medical History:   Diagnosis Date  . Arthritis   . Back pain   . CAD (coronary artery disease)    a. 01/2014: cath showing 70% stenosis of non-dominant RCA --> medically managed.   . Cataract    bilat removed  . Diverticulosis   . GERD (gastroesophageal reflux disease)   . Hemorrhoids   . Hiatal hernia   . Hyperlipidemia   . Hypertension   . Melanoma ([place])   . Orbital fracture (H[facility])    right  . Osteopenia 07/2015   T score -2.0 FRAX 10%/1.4%  . Pituitary tumor   . Stricture of esophagus   . Vitamin D deficiency     Past Surgical History:  Procedure Laterality Date  . CERVICAL DISC SURGERY     x 2  . COLONOSCOPY    . EXCISION OF SKIN CANCER     Melanoma  . FOOT SURGERY    . HEMORRHOID BANDING     X3  . LEFT HEART CATHETERIZATION WITH CORONARY ANGIOGRAM N/A 02/03/2014   Procedure: LEFT HEART CATHETERIZATION WITH CORONARY ANGIOGRAM;  Surgeon: [name], MD;  Location: [facility] CATH LAB;  Service: Cardiovascular;  Laterality: N/A;  . LUMBAR [place] SURGERY  2018  . OOPHORECTOMY  2012   BSO  . PELVIC LAPAROSCOPY  2012   Diag Lap-BSO-lysis of adhesions  . TUBAL LIGATION    . VAGINAL HYSTERECTOMY  1993    Current Medications: Current Meds  Medication Sig  . aspirin EC 81 MG tablet Take 1 tablet (81 mg total) by mouth daily.  . Cholecalciferol (VITAMIN D PO) Take 1 tablet by mouth  daily.   . fluticasone (FLONASE) 50 MCG/ACT nasal spray fluticasone propionate 50 mcg/actuation nasal spray,suspension  instill 1 spray into each nostril DAILY  . gabapentin (NEURONTIN) 300 MG capsule Take 1 capsule (300 mg total) by mouth 3 (three) times daily as needed.  . hydrochlorothiazide (HYDRODIURIL) 25 MG tablet Take 1 tablet (25 mg total) by mouth daily.  Marland Kitchen HYDROcodone-acetaminophen (NORCO) 10-325 MG tablet hydrocodone 10 mg-acetaminophen 325 mg tablet  TAKE ONE TABLET BY MOUTH 4 TIMES DAILY AS NEEDED  . LORazepam (ATIVAN) 0.5 MG tablet lorazepam 0.5 mg tablet  Take 1 tablet twice daily as needed for  anxiety  . losartan (COZAAR) 100 MG tablet Take 1 tablet (100 mg total) by mouth daily.  Marland Kitchen omeprazole (PRILOSEC) 40 MG capsule Take 1 capsule by mouth every day  . QUEtiapine (SEROQUEL) 25 MG tablet Take 1 tablet (25 mg total) by mouth at bedtime.  . sertraline (ZOLOFT) 50 MG tablet Take 50 mg by mouth daily.  . traMADol (ULTRAM) 50 MG tablet Take 50 mg by mouth every 6 (six) hours as needed. for pain  . [DISCONTINUED] hydrochlorothiazide (MICROZIDE) 12.5 MG capsule Take 1 capsule (12.5 mg total) by mouth daily.     Allergies:   Ciprofloxacin, Lisinopril, Pneumococcal vaccine, Pravastatin, and Amlodipine   Social History   Socioeconomic History  . Marital status: Married    Spouse name: Not on file  . Number of children: 3  . Years of education: Not on file  . Highest education level: Not on file  Occupational History  . Occupation: Disabled  Tobacco Use  . Smoking status: Former Smoker    Quit date: 03/19/1994    Years since quitting: 26.3  . Smokeless tobacco: Never Used  Vaping Use  . Vaping Use: Never used  Substance and Sexual Activity  . Alcohol use: No    Alcohol/week: 0.0 standard drinks  . Drug use: No  . Sexual activity: Not Currently    Birth control/protection: Surgical    Comment: 1st intercourse 75 yo-Fewer than 5 partners,des neg  Other Topics Concern  . Not on file  Social History Narrative  . Not on file   Social Determinants of Health   Financial Resource Strain: Not on file  Food Insecurity: Not on file  Transportation Needs: Not on file  Physical Activity: Not on file  Stress: Not on file  Social Connections: Not on file     Family History: The patient's family history includes Cancer in her brother and father; Colon cancer in her brother, father, and sister; Colon polyps in her brother, father, and sister; Diabetes in her brother and sister; Heart disease in her brother, father, mother, and sister; Hypertension in her father and mother; Melanoma  in her brother, brother, and sister; Stroke in her mother. There is no history of Esophageal cancer, Rectal cancer, or Stomach cancer.  ROS:   Please see the history of present illness.    Review of Systems  Constitutional: Negative for chills and fever.  HENT: Negative for hearing loss.   Eyes: Negative for blurred vision and redness.  Respiratory: Negative for shortness of breath.   Cardiovascular: Negative for chest pain, palpitations, orthopnea, claudication, leg swelling and PND.  Gastrointestinal: Negative for melena, nausea and vomiting.  Genitourinary: Negative for dysuria and flank pain.  Musculoskeletal: Positive for myalgias.  Neurological: Negative for dizziness and loss of consciousness.  Endo/Heme/Allergies: Negative for polydipsia.  Psychiatric/Behavioral: Negative for substance abuse.    EKGs/Labs/Other Studies Reviewed:  The following studies were reviewed today: Myoview 2019: Myocardial perfusion is abnormal. Findings consistent with ischemia and prior myocardial infarction. This is an intermediate risk study. Overall left ventricular systolic function was normal. LV cavity size is mildly enlarged. Nuclear stress EF: 62%. The left ventricular ejection fraction is normal (55-65%). There is no prior study for comparison.  Coronary CTA 03/2018: FINDINGS: Non-cardiac: See separate report from [facility].  Pulmonary veins drain normally to the left atrium.  Calcium Score: 53 Agatston units.  Coronary Arteries: Left dominant with no anomalies  LM: Short, no plaque or stenosis.  LAD system: Mixed plaque in the proximal LAD with mild (<50%) stenosis.  Circumflex system: No plaque or stenosis.  RCA system: Small, nondominant vessel. No plaque or stenosis.  IMPRESSION: 1.Coronary artery calcium score 53 Agatston units. This places the patient in the 64th percentile for age and gender, suggesting intermediate risk for future cardiac  events.  2. Nonobstructive disease in the proximal LAD.  TTE 05/06/20: IMPRESSIONS  1. Left ventricular ejection fraction, by estimation, is 60 to 65%. Left  ventricular ejection fraction by 3D volume is 63 %. The left ventricle has  normal function. The left ventricle has no regional wall motion  abnormalities. Left ventricular diastolic  parameters are consistent with Grade I diastolic dysfunction (impaired  relaxation). The average left ventricular global longitudinal strain is  -24.3 %. The global longitudinal strain is normal.  2. Right ventricular systolic function is normal. The right ventricular  size is normal. There is normal pulmonary artery systolic pressure. The  estimated right ventricular systolic pressure is 09.3 mmHg.  3. The mitral valve is grossly normal. No evidence of mitral valve  regurgitation.  4. The aortic valve is tricuspid. Aortic valve regurgitation is trivial.  No aortic stenosis is present.  5. Aortic dilatation noted. There is mild dilatation of the aortic root,  measuring 42 mm. There is mild dilatation of the ascending aorta,  measuring 42 mm.    Recent Labs: 07/11/2019: ALT 21; Hemoglobin 13.3; Platelets 220 04/28/2020: BUN 8; Creatinine, Ser 0.81; Potassium 3.9; Sodium 140  Recent Lipid Panel    Component Value Date/Time   CHOL 184 10/23/2018 0823   TRIG 158 (H) 10/23/2018 0823   HDL 43 10/23/2018 0823   CHOLHDL 4.3 10/23/2018 0823   LDLCALC 109 (H) 10/23/2018 0823     Risk Assessment/Calculations:       Physical Exam:    VS:  BP 130/78   Pulse 72   Ht 5\' 5"  (1.651 m)   Wt 154 lb 12.8 oz (70.2 kg)   SpO2 98%   BMI 25.76 kg/m     Wt Readings from Last 3 Encounters:  07/04/20 154 lb 12.8 oz (70.2 kg)  05/08/20 151 lb 9.6 oz (68.8 kg)  04/18/20 151 lb 9.6 oz (68.8 kg)     GEN:  Well nourished, well developed in no acute distress HEENT: Normal NECK: No carotid bruits; normal JVD CARDIAC: RRR, 2/6 systolic murmur. No  rubs, gallops RESPIRATORY:  Clear to auscultation without rales, wheezing or rhonchi  ABDOMEN: Soft, non-tender, non-distended MUSCULOSKELETAL:  No edema; No deformity  SKIN: Warm and dry NEUROLOGIC:  Alert and oriented x 3 PSYCHIATRIC:  Normal affect   ASSESSMENT:    1. Shortness of breath   2. Essential hypertension   3. Coronary artery disease involving native coronary artery of native heart without angina pectoris   4. Hypercholesterolemia   5. Leg pain, anterior, left   6. Primary hypertension  7. Murmur    PLAN:    In order of problems listed above:  #Shortness of breath: Resolved. Cardiac work-up including coronary CTA and myoview reassuringly normal. Symptoms worsened since the loss of her husband, but having improved. Notably, we stopped her atenolol during last visit for HR 52 in case symptomatic bradycardia was contributing.  TTE 04/2020 with normal BiV function, G1DD, no significant valve disease. -Stopped atenolol -Continue losartan 100mg  daily -TTE with normal LVEF, no significant valve disease -Continue to monitor  #Cardiac Murmur: 2/6 systolic murmur on exam. Normal EF on myoview. TTE without significant valvular disease. -TTE with normal LVEF, no significant valve disease  #Aortic root and ascending aorta dilation: Root measures 104mm and ascending aorta measures 32mm.  -Will need serial monitoring with yearly TTE/CT/MR -Continue blood pressure control as detailed below  #Mild non-obstructive CAD: CTA coronaries with mild <50% LAD disease; small non-dominant RCA. Calcium score 53 (64% for age and gender controls).  -Continue ASA 81mg  daily -Pending lipid panel, will start zetia due to intolerance to statins -Repeat lipids with LFTs next week  #HTN: Elevated in the afternoons but well controlled in the AM -Stopped atenolol due to bradycardia as above -Increase HCTZ to 25mg  in the AM -Start taking losartan 100mg  in the afternoon -BMET next  week -Needs to maintain adequate hydration while on HCTZ  #HLD: Last LDL 109. History of intolerance to pravastatin. -Repeat lipids next week -If LDL>100, can start zetia 10mg  daily   #Left Leg Pain: Sounds nerve or MSK. No claudication symptoms and no LE swelling. Has minor chronic venous stasis changes but not likely driving symptoms. -Refer to Dr. Tamala Julian with [facility] -Continue compression socks for venous stasis     Medication Adjustments/Labs and Tests Ordered: Current medicines are reviewed at length with the patient today.  Concerns regarding medicines are outlined above.  Orders Placed This Encounter  Procedures  . Basic Metabolic Panel (BMET)  . Hepatic function panel  . Lipid Profile  . AMB referral to sports medicine   Meds ordered this encounter  Medications  . hydrochlorothiazide (HYDRODIURIL) 25 MG tablet    Sig: Take 1 tablet (25 mg total) by mouth daily.    Dispense:  90 tablet    Refill:  3    Patient Instructions  Medication Instructions:  Your physician has recommended you make the following change in your medication:  INCREASE Hydrochlorothiazide (HCTZ) to 25 mg daily - take in the morning Take Losartan 100 mg in the morning  *If you need a refill on your cardiac medications before your next appointment, please call your pharmacy*   Lab Work: Your physician recommends that you return for lab work in [place] week  You will need to FAST for this appointment - nothing to eat or drink after midnight the night before except water.   If you have labs (blood work) drawn today and your tests are completely normal, you will receive your results only by: Marland Kitchen MyChart Message (if you have MyChart) OR . A paper copy in the mail If you have any lab test that is abnormal or we need to change your treatment, we will call you to review the results.   Testing/Procedures: None Ordered   Follow-Up: At [facility], you and your health needs are our  priority.  As part of our continuing mission to provide you with exceptional heart care, we have created designated Provider Care Teams.  These Care Teams include your primary Cardiologist (physician) and Advanced  Practice Providers (APPs -  Physician Assistants and Nurse Practitioners) who all work together to provide you with the care you need, when you need it.  We recommend signing up for the patient portal called "MyChart".  Sign up information is provided on this After Visit Summary.  MyChart is used to connect with patients for Virtual Visits (Telemedicine).  Patients are able to view lab/test results, encounter notes, upcoming appointments, etc.  Non-urgent messages can be sent to your provider as well.   To learn more about what you can do with MyChart, go to [email or web address].    Your next appointment:   6 month(s)  The format for your next appointment:   In Person  Provider:   You may see Freada Bergeron, MD or one of the following Advanced Practice Providers on your designated Care Team:    Richardson Dopp, PA-C  Robbie Lis, [place]    Other Instructions You have been referred to [facility] for your leg pain      Signed, Freada Bergeron, MD  07/04/2020 10:55 AM    [place]

## 2020-07-04 ENCOUNTER — Other Ambulatory Visit: Payer: Self-pay

## 2020-07-04 ENCOUNTER — Other Ambulatory Visit: Payer: Self-pay | Admitting: Nurse Practitioner

## 2020-07-04 ENCOUNTER — Encounter: Payer: Self-pay | Admitting: Cardiology

## 2020-07-04 ENCOUNTER — Ambulatory Visit: Payer: Medicare Other | Admitting: Cardiology

## 2020-07-04 VITALS — BP 130/78 | HR 72 | Ht 65.0 in | Wt 154.8 lb

## 2020-07-04 DIAGNOSIS — I10 Essential (primary) hypertension: Secondary | ICD-10-CM | POA: Diagnosis not present

## 2020-07-04 DIAGNOSIS — R0602 Shortness of breath: Secondary | ICD-10-CM | POA: Diagnosis not present

## 2020-07-04 DIAGNOSIS — R011 Cardiac murmur, unspecified: Secondary | ICD-10-CM

## 2020-07-04 DIAGNOSIS — M79605 Pain in left leg: Secondary | ICD-10-CM

## 2020-07-04 DIAGNOSIS — I2510 Atherosclerotic heart disease of native coronary artery without angina pectoris: Secondary | ICD-10-CM

## 2020-07-04 DIAGNOSIS — E7800 Pure hypercholesterolemia, unspecified: Secondary | ICD-10-CM

## 2020-07-04 MED ORDER — HYDROCHLOROTHIAZIDE 25 MG PO TABS: 25.0000 mg | ORAL_TABLET | Freq: Every day | ORAL | 3 refills | Status: DC

## 2020-07-04 MED ORDER — LOSARTAN POTASSIUM 100 MG PO TABS: 100.0000 mg | ORAL_TABLET | Freq: Every day | ORAL | 3 refills | Status: DC

## 2020-07-04 NOTE — Patient Instructions (Signed)
Medication Instructions:  Your physician has recommended you make the following change in your medication:  INCREASE Hydrochlorothiazide (HCTZ) to 25 mg daily - take in the morning Take Losartan 100 mg in the morning  *If you need a refill on your cardiac medications before your next appointment, please call your pharmacy*   Lab Work: Your physician recommends that you return for lab work in [place] week  You will need to FAST for this appointment - nothing to eat or drink after midnight the night before except water.   If you have labs (blood work) drawn today and your tests are completely normal, you will receive your results only by: Marland Kitchen MyChart Message (if you have MyChart) OR . A paper copy in the mail If you have any lab test that is abnormal or we need to change your treatment, we will call you to review the results.   Testing/Procedures: None Ordered   Follow-Up: At [facility], you and your health needs are our priority.  As part of our continuing mission to provide you with exceptional heart care, we have created designated Provider Care Teams.  These Care Teams include your primary Cardiologist (physician) and Advanced Practice Providers (APPs -  Physician Assistants and Nurse Practitioners) who all work together to provide you with the care you need, when you need it.  We recommend signing up for the patient portal called "MyChart".  Sign up information is provided on this After Visit Summary.  MyChart is used to connect with patients for Virtual Visits (Telemedicine).  Patients are able to view lab/test results, encounter notes, upcoming appointments, etc.  Non-urgent messages can be sent to your provider as well.   To learn more about what you can do with MyChart, go to [email or web address].    Your next appointment:   6 month(s)  The format for your next appointment:   In Person  Provider:   You may see Freada Bergeron, MD or one of the following Advanced  Practice Providers on your designated Care Team:    Richardson Dopp, PA-C  Robbie Lis, [place]    Other Instructions You have been referred to [facility] for your leg pain

## 2020-07-13 ENCOUNTER — Other Ambulatory Visit: Payer: Self-pay | Admitting: Nurse Practitioner

## 2020-07-13 DIAGNOSIS — Z1231 Encounter for screening mammogram for malignant neoplasm of breast: Secondary | ICD-10-CM

## 2020-07-13 NOTE — Progress Notes (Signed)
Subjective:    I'm seeing this patient as a consultation for:  Dr. Gwyndolyn Kaufman. Note will be routed back to referring provider/PCP.  CC: Left lower leg pain  I, Molly Weber, LAT, ATC, am serving as scribe for Dr. Lynne Leader.  HPI: Pt is a 75 y/o female c/o L leg pain x 2 months. She does recall having fallen in Feb 2022 and landed on her L knee.  Pt locates pain to the L anterior lower leg and into her dorsal foot.  She has a history of chronic back pain and lumbar radiculopathy.  She currently sees Dr. Nelva Bush at [facility] where she is receiving occasional back injections most recently 2 months ago.  These are primarily aimed at treating her back pain and less to her leg pain per her.  Patient notes that she is only able to walk short distance before her leg pain starts bothering her.  Her pain improves when she stops walking and sits down and rests.  She does have a history of coronary artery disease but does not have any known history of peripheral arterial disease.   Lower leg swelling: No Aggravates: walking Treatments tried: Voltaren gel  Dx imaging: 10/22/15 L-spine MRI  07/25/15 L-spine XR  Past medical history, Surgical history, Family history, Social history, Allergies, and medications have been entered into the medical record, reviewed.   Review of Systems: No new headache, visual changes, nausea, vomiting, diarrhea, constipation, dizziness, abdominal pain, skin rash, fevers, chills, night sweats, weight loss, swollen lymph nodes, body aches, joint swelling, muscle aches, chest pain, shortness of breath, mood changes, visual or auditory hallucinations.   Objective:    Vitals:   07/14/20 1236  BP: 114/72  Pulse: 79  SpO2: 98%   General: Well Developed, well nourished, and in no acute distress.  Neuro/Psych: Alert and oriented x3, extra-ocular muscles intact, able to move all 4 extremities, sensation grossly intact. Skin: Warm and dry, no rashes noted.   Respiratory: Not using accessory muscles, speaking in full sentences, trachea midline.  Cardiovascular: Pulses palpable, no extremity edema. Abdomen: Does not appear distended. MSK: L-spine normal. Nontender midline. Decreased lumbar motion. Negative slump test left leg. Lower remedy strength is intact with exception of left great toe dorsiflexion which is diminished 4/5.  Left knee and lower leg normal-appearing Nontender. Normal motion. Lower extremity strength is intact except noted above.  Pulses decreased left dorsal pedis intact posterior tibialis  Lab and Radiology Results  X-ray images left tib-fib and L-spine obtained today personally and independently interpreted  L-spine: Decreased bone mineral density.  Degenerative changes L5-S1 anterior listhesis L4-L5  Left tib-fib no fractures.  Mild degenerative changes knee and ankle  Await formal radiology review  EXAM: MRI LUMBAR SPINE WITHOUT CONTRAST  TECHNIQUE: Multiplanar, multisequence MR imaging of the lumbar spine was performed. No intravenous contrast was administered.  COMPARISON:  MRI of the lumbar spine September 23, 2014  FINDINGS: SEGMENTATION: For the purposes of this report, the last well-formed intervertebral disc will be described as L5-S1.  ALIGNMENT: No malalignment.  Maintenance of the lumbar lordosis.  VERTEBRAE:Lumbar vertebral bodies are intact. Unchanged moderate L1-2 and mild L5-S1 disc height loss. Decreased T2 signal within the discs compatible with desiccation. Mild chronic discogenic endplate change at Z6-1, mild subacute on chronic discogenic endplate change at W9-U0. No STIR signal abnormality to suggest fracture. Scattered old Schmorl's nodes.  CONUS MEDULLARIS: Conus medullaris terminates at L1-2 and demonstrates normal morphology and signal characteristics. Cauda equina  is normal.  PARASPINAL AND SOFT TISSUES: Included prevertebral and paraspinal soft tissues are normal.  Extra renal pelvises. Mildly ectatic infrarenal aorta.  DISC LEVELS:  T12-L1: No disc bulge, canal stenosis nor neural foraminal narrowing.  L1-2: Small cyst LEFT central disc protrusion without canal stenosis or neural foraminal narrowing.  L2-3: No disc bulge. Mild facet arthropathy and ligamentum flavum redundancy without canal stenosis or neural foraminal narrowing.  L3-4: Annular bulging. Mild facet arthropathy and ligamentum flavum redundancy without canal stenosis or neural foraminal narrowing.  L4-5: No disc bulge. Mild facet arthropathy and ligamentum flavum redundancy without canal stenosis or neural foraminal narrowing.  L5-S1: 8 x 8 mm RIGHT central disc extrusion at site of prior protrusion contacts the traversing RIGHT S1 nerve within the lateral recess. No disc migration. Mild effacement ventral thecal sac without canal stenosis. Mild to moderate facet arthropathy and ligamentum flavum redundancy. Mild RIGHT neural foraminal narrowing.  IMPRESSION: 8 x 8 mm RIGHT central L5-S1 disc extrusion at site of prior protrusion, contacting the traversing RIGHT S1 nerve.  Mild ventral thecal sac effacement at L5-S1 without canal stenosis. Mild RIGHT L5-S1 neural foraminal narrowing.   Electronically Signed   By: Elon Alas M.D.   On: 10/23/2015 01:20 I, Lynne Leader, personally (independently) visualized and performed the interpretation of the images attached in this note.   Impression and Recommendations:    Assessment and Plan: 75 y.o. female with left lower leg pain.  Distribution of pain is consistent with an L5 radiculopathy.  She does have some weakness to left great toe dorsiflexion which also fits with this pattern..  She does have a central disc extrusion seen on MRI lumbar spine at Z6-X0 in 9604 which certainly could potentially cause left leg symptoms in the future.  However the clear cause of her current pain remains somewhat unclear.   Claudication is a possibility as is muscle dysfunction and fatigue of the foot dorsiflexors and eversion muscles in the area of pain.  Plan to obtain x-ray L-spine and tib-fib and treat conservatively with home exercise program aimed at ankle stabilization and muscle strengthening.  Additionally will check ABI to evaluate for possibility of claudication.  Recheck in 1 month.  If not improved by then consider lumbar MRI to further characterize possibility of lumbar radiculopathy.  If this is shown to be the case Dr. Nelva Bush would be a great candidate for epidural steroid injection.  CC: PCP and Dr Nelva Bush  PDMP not reviewed this encounter. Orders Placed This Encounter  Procedures  . DG Tibia/Fibula Left    Standing Status:   Future    Standing Expiration Date:   07/14/2021    Order Specific Question:   Reason for Exam (SYMPTOM  OR DIAGNOSIS REQUIRED)    Answer:   eval left leg pain after fall    Order Specific Question:   Preferred imaging location?    Answer:   [facility]  . DG Lumbar Spine 2-3 Views    Standing Status:   Future    Standing Expiration Date:   07/14/2021    Order Specific Question:   Reason for Exam (SYMPTOM  OR DIAGNOSIS REQUIRED)    Answer:   eval poss l5 rad left    Order Specific Question:   Preferred imaging location?    Answer:   [facility]   No orders of the defined types were placed in this encounter.   Discussed warning signs or symptoms. Please see discharge instructions. Patient expresses understanding.  The above documentation has been reviewed and is accurate and complete Lynne Leader, M.D.

## 2020-07-14 ENCOUNTER — Ambulatory Visit: Payer: Self-pay

## 2020-07-14 ENCOUNTER — Ambulatory Visit (INDEPENDENT_AMBULATORY_CARE_PROVIDER_SITE_OTHER): Payer: Medicare Other

## 2020-07-14 ENCOUNTER — Encounter: Payer: Self-pay | Admitting: Family Medicine

## 2020-07-14 ENCOUNTER — Other Ambulatory Visit: Payer: Self-pay

## 2020-07-14 ENCOUNTER — Ambulatory Visit: Payer: Medicare Other | Admitting: Family Medicine

## 2020-07-14 VITALS — BP 114/72 | HR 79 | Ht 65.0 in | Wt 154.4 lb

## 2020-07-14 DIAGNOSIS — M79662 Pain in left lower leg: Secondary | ICD-10-CM

## 2020-07-14 NOTE — Patient Instructions (Signed)
Good to see you today.  Please get an Xray today before you leave.  Please perform the exercise program that we have prepared for you and gone over in detail on a daily basis.  In addition to the handout you were provided you can access your program through: [email or web address]   Your unique program code is: [email or web address]  I have ordered an ABI test to check for blood flow in your leg.  That office will all you to schedule.    Please follow-up in one month.

## 2020-07-15 LAB — HEPATIC FUNCTION PANEL
ALT: 12 IU/L (ref 0–32)
AST: 23 IU/L (ref 0–40)
Albumin: 4.5 g/dL (ref 3.7–4.7)
Alkaline Phosphatase: 100 IU/L (ref 44–121)
Bilirubin Total: 0.5 mg/dL (ref 0.0–1.2)
Bilirubin, Direct: 0.11 mg/dL (ref 0.00–0.40)
Total Protein: 6.8 g/dL (ref 6.0–8.5)

## 2020-07-15 LAB — LIPID PANEL
Chol/HDL Ratio: 6.3 ratio — ABNORMAL HIGH (ref 0.0–4.4)
Cholesterol, Total: 271 mg/dL — ABNORMAL HIGH (ref 100–199)
HDL: 43 mg/dL (ref >39)
LDL Chol Calc (NIH): 188 mg/dL — ABNORMAL HIGH (ref 0–99)
Triglycerides: 208 mg/dL — ABNORMAL HIGH (ref 0–149)
VLDL Cholesterol Cal: 40 mg/dL (ref 5–40)

## 2020-07-15 LAB — BASIC METABOLIC PANEL
BUN/Creatinine Ratio: 12 (ref 12–28)
BUN: 12 mg/dL (ref 8–27)
CO2: 26 mmol/L (ref 20–29)
Calcium: 9.3 mg/dL (ref 8.7–10.3)
Chloride: 99 mmol/L (ref 96–106)
Creatinine, Ser: 0.99 mg/dL (ref 0.57–1.00)
Glucose: 90 mg/dL (ref 65–99)
Potassium: 4.2 mmol/L (ref 3.5–5.2)
Sodium: 141 mmol/L (ref 134–144)
eGFR: 61 mL/min/{1.73_m2} (ref >59)

## 2020-07-18 ENCOUNTER — Ambulatory Visit (HOSPITAL_COMMUNITY)
Admission: RE | Admit: 2020-07-18 | Discharge: 2020-07-18 | Disposition: A | Discharge status: Home / Self Care | Payer: Medicare Other | Source: Ambulatory Visit | Attending: Cardiology | Admitting: Cardiology

## 2020-07-18 ENCOUNTER — Other Ambulatory Visit (HOSPITAL_COMMUNITY): Payer: Self-pay | Admitting: Family Medicine

## 2020-07-18 DIAGNOSIS — M79662 Pain in left lower leg: Secondary | ICD-10-CM | POA: Diagnosis not present

## 2020-07-18 DIAGNOSIS — M79605 Pain in left leg: Secondary | ICD-10-CM

## 2020-07-18 NOTE — Progress Notes (Signed)
X-ray lumbar spine shows some arthritis changes at L1-L2

## 2020-07-18 NOTE — Progress Notes (Signed)
X-ray left lower leg looks normal to radiology

## 2020-07-20 ENCOUNTER — Telehealth: Payer: Self-pay | Admitting: *Deleted

## 2020-07-20 DIAGNOSIS — Z789 Other specified health status: Secondary | ICD-10-CM

## 2020-07-20 DIAGNOSIS — I2510 Atherosclerotic heart disease of native coronary artery without angina pectoris: Secondary | ICD-10-CM

## 2020-07-20 DIAGNOSIS — E7800 Pure hypercholesterolemia, unspecified: Secondary | ICD-10-CM

## 2020-07-20 DIAGNOSIS — Z79899 Other long term (current) drug therapy: Secondary | ICD-10-CM

## 2020-07-20 DIAGNOSIS — E785 Hyperlipidemia, unspecified: Secondary | ICD-10-CM

## 2020-07-20 MED ORDER — EZETIMIBE 10 MG PO TABS: 10.0000 mg | ORAL_TABLET | Freq: Every day | ORAL | 1 refills | Status: DC

## 2020-07-20 NOTE — Telephone Encounter (Signed)
Spoke with the pt and made her aware of her lab results and recommendations per Dr. Johney Frame. Informed the pt that Dr. Johney Frame would like for her to start taking zetia 10 mg po daily, and refer her to our lipid clinic for further management, statin intolerance, and for consideration of PCSK9-Inhibitors.  Confirmed the pharmacy of choice with the pt.  Informed the pt that I will place the referral to our lipid clinic in the system and send a message to our [facility] Schedulers to call her back and arrange this appt.  Pt verbalized understanding and agrees with this plan.

## 2020-07-20 NOTE — Progress Notes (Signed)
The blood flow test is abnormal and you are scheduled for a more definitive test on May 5 which we should have more information about.  Likely the results of that test will be referral to vascular surgery to discuss further treatment if needed.  More information forthcoming.

## 2020-07-20 NOTE — Telephone Encounter (Signed)
-----   Message from Freada Bergeron, MD sent at 07/15/2020  3:28 AM EDT ----- Her cholesterol is very elevated with LDL 188 (goal <70). She has not tolerated statins in the past. Can we start her on zetia 10mg  daily and get her into lipid clinic to discuss options of possible PCSK9i for her if she is amenable.

## 2020-07-21 ENCOUNTER — Other Ambulatory Visit: Payer: Self-pay

## 2020-07-21 ENCOUNTER — Ambulatory Visit (HOSPITAL_COMMUNITY)
Admission: RE | Admit: 2020-07-21 | Discharge: 2020-07-21 | Disposition: A | Discharge status: Home / Self Care | Payer: Medicare Other | Source: Ambulatory Visit | Attending: Cardiology | Admitting: Cardiology

## 2020-07-21 DIAGNOSIS — M79605 Pain in left leg: Secondary | ICD-10-CM | POA: Diagnosis present

## 2020-07-22 NOTE — Progress Notes (Signed)
Called Amanda [name] to review arterial duplex.  She is aware of results and already has follow-up appointment with Dr. Fletcher Anon on May 10.  Recommend she keep her appointment with me on May 26.  Check back as needed if needed sooner.

## 2020-07-26 ENCOUNTER — Ambulatory Visit: Payer: Medicare Other | Admitting: Cardiovascular Disease

## 2020-07-26 ENCOUNTER — Encounter: Payer: Self-pay | Admitting: Cardiovascular Disease

## 2020-07-26 ENCOUNTER — Other Ambulatory Visit: Payer: Self-pay

## 2020-07-26 DIAGNOSIS — I739 Peripheral vascular disease, unspecified: Secondary | ICD-10-CM

## 2020-07-26 HISTORY — DX: Peripheral vascular disease, unspecified: I73.9

## 2020-07-26 NOTE — Patient Instructions (Signed)
    [address] [address] Dept: [phone] Loc: [place]  07/26/2020  You are scheduled for a Peripheral Angiogram on Monday, May 16 with Dr. Quay Burow.  1. Please arrive at the [facility] (Main Entrance A) at [facility]: [address] [place], [place] [address] at 5:30 AM (This time is two hours before your procedure to ensure your preparation). Free valet parking service is available.   Special note: Every effort is made to have your procedure done on time. Please understand that emergencies sometimes delay scheduled procedures.  2. Diet: Do not eat solid foods after midnight.  The patient may have clear liquids until 5am upon the day of the procedure.  3. Labs: You will need to have blood drawn today in the office.  4. Medication instructions in preparation for your procedure:  On the morning of your procedure, take your Aspirin and any morning medicines NOT listed above.  You may use sips of water.  5. Plan for one night stay--bring personal belongings. 6. Bring a current list of your medications and current insurance cards. 7. You MUST have a responsible person to drive you home. 8. Someone MUST be with you the first 24 hours after you arrive home or your discharge will be delayed. 9. Please wear clothes that are easy to get on and off and wear slip-on shoes.  Thank you for allowing [place] to care for you!   -- [facility]  You will need a COVID-19  test prior to your procedure. You are scheduled for Friday, May 13th at 11:30 AM. This is a Drive Up Visit at [address] [place], [place] [address]. Someone will direct you to the appropriate testing line. Stay in your car and someone will be with you shortly.  You will need follow up lower extremity arterial ultrasounds 1 week after your  procedure.  You will need a follow up office visit with Dr. Gwenlyn Found 2-3 weeks after your procedure.

## 2020-07-26 NOTE — Progress Notes (Signed)
07/26/2020 Amanda [name]   1946/02/23  [id number]  Primary Physician [name], [name], NP Primary Cardiologist: Lorretta Harp MD Lupe Carney, [place]  HPI:  Amanda [name] is a 75 y.o. recently widowed (husband of 70 years died a month ago) Caucasian female mother of 2 living children (1 child deceased, died of an accident), grandmother of 34 grandchildren, great grandmother of 7 great-grandchildren is retired from working in a Special educational needs teacher.  She was referred for lifestyle limiting claudication.  Her primary care provider is Dr. Sherene Sires and cardiologist Dr. Johney Frame.  She has a history of CAD status post cath in 2015 revealing a 70% nondominant RCA with a recent CTA that showed less than 50% proximal LAD stenosis.  She has treated hypertension and hyperlipidemia.  She has a strong family history of heart disease with both parents and 2 siblings who had CAD.  She is never had a heart attack or stroke.  She denies chest pain or shortness of breath.  She does complain of left calf lifestyle limiting claudication with recent Dopplers performed 07/21/2020 suggesting a high-grade mid left SFA stenosis.   Current Meds  Medication Sig  . aspirin EC 81 MG tablet Take 1 tablet (81 mg total) by mouth daily.  . Cholecalciferol (VITAMIN D PO) Take 1 tablet by mouth daily.   Marland Kitchen ezetimibe (ZETIA) 10 MG tablet Take 1 tablet (10 mg total) by mouth daily.  . fluticasone (FLONASE) 50 MCG/ACT nasal spray fluticasone propionate 50 mcg/actuation nasal spray,suspension  instill 1 spray into each nostril DAILY  . hydrochlorothiazide (HYDRODIURIL) 25 MG tablet Take 1 tablet (25 mg total) by mouth daily.  Marland Kitchen HYDROcodone-acetaminophen (NORCO) 10-325 MG tablet hydrocodone 10 mg-acetaminophen 325 mg tablet  TAKE ONE TABLET BY MOUTH 4 TIMES DAILY AS NEEDED  . LORazepam (ATIVAN) 0.5 MG tablet lorazepam 0.5 mg tablet  Take 1 tablet twice daily as needed for anxiety  . losartan (COZAAR) 100 MG tablet  Take 1 tablet (100 mg total) by mouth daily.  Marland Kitchen omeprazole (PRILOSEC) 40 MG capsule Take 1 capsule by mouth every day  . QUEtiapine (SEROQUEL) 25 MG tablet Take 1 tablet (25 mg total) by mouth at bedtime.  . sertraline (ZOLOFT) 50 MG tablet Take 50 mg by mouth daily.  . traMADol (ULTRAM) 50 MG tablet Take 50 mg by mouth every 6 (six) hours as needed. for pain     Allergies  Allergen Reactions  . Ciprofloxacin Rash  . Lisinopril Swelling  . Pneumococcal Vaccine Swelling  . Pravastatin Other (See Comments)    Muscle aches   . Amlodipine     swelling    Social History   Socioeconomic History  . Marital status: Married    Spouse name: Not on file  . Number of children: 3  . Years of education: Not on file  . Highest education level: Not on file  Occupational History  . Occupation: Disabled  Tobacco Use  . Smoking status: Former Smoker    Quit date: 03/19/1994    Years since quitting: 26.3  . Smokeless tobacco: Never Used  Vaping Use  . Vaping Use: Never used  Substance and Sexual Activity  . Alcohol use: No    Alcohol/week: 0.0 standard drinks  . Drug use: No  . Sexual activity: Not Currently    Birth control/protection: Surgical    Comment: 1st intercourse 75 yo-Fewer than 5 partners,des neg  Other Topics Concern  . Not on file  Social  History Narrative  . Not on file   Social Determinants of Health   Financial Resource Strain: Not on file  Food Insecurity: Not on file  Transportation Needs: Not on file  Physical Activity: Not on file  Stress: Not on file  Social Connections: Not on file  Intimate Partner Violence: Not on file     Review of Systems: General: negative for chills, fever, night sweats or weight changes.  Cardiovascular: negative for chest pain, dyspnea on exertion, edema, orthopnea, palpitations, paroxysmal nocturnal dyspnea or shortness of breath Dermatological: negative for rash Respiratory: negative for cough or wheezing Urologic: negative  for hematuria Abdominal: negative for nausea, vomiting, diarrhea, bright red blood per rectum, melena, or hematemesis Neurologic: negative for visual changes, syncope, or dizziness All other systems reviewed and are otherwise negative except as noted above.    Blood pressure (!) 142/66, pulse 66, height 5\' 5"  (1.651 m), weight 157 lb (71.2 kg).  General appearance: alert and no distress Neck: no adenopathy, no carotid bruit, no JVD, supple, symmetrical, trachea midline and thyroid not enlarged, symmetric, no tenderness/mass/nodules Lungs: clear to auscultation bilaterally Heart: regular rate and rhythm, S1, S2 normal, no murmur, click, rub or gallop Extremities: extremities normal, atraumatic, no cyanosis or edema Pulses: 2+ and symmetric Skin: Skin color, texture, turgor normal. No rashes or lesions Neurologic: Alert and oriented X 3, normal strength and tone. Normal symmetric reflexes. Normal coordination and gait  EKG not performed today  ASSESSMENT AND PLAN:   Peripheral arterial disease ([place]) Amanda [name] was referred to me by Dr. Wilmon Arms for PAD.  She has left calf claudication which is lifestyle limiting over the last several months.  Lower extremity arterial Doppler studies performed 07/21/2020 revealed a right ABI of 1.07 and a left of 0.85 with a high-frequency signal in her mid left SFA.  She wishes to proceed with angiography and endovascular therapy.      Lorretta Harp MD FACP,FACC,FAHA, [facility] 07/26/2020 10:51 AM

## 2020-07-26 NOTE — H&P (View-Only) (Signed)
07/26/2020 Amanda [name]   17-Jan-1946  [id number]  Primary Physician [name], [name], NP Primary Cardiologist: Lorretta Harp MD Lupe Carney, [place]  HPI:  Amanda [name] is a 75 y.o. recently widowed (husband of 4 years died a month ago) Caucasian female mother of 2 living children (1 child deceased, died of an accident), grandmother of 71 grandchildren, great grandmother of 61 great-grandchildren is retired from working in a Special educational needs teacher.  She was referred for lifestyle limiting claudication.  Her primary care provider is Dr. Sherene Sires and cardiologist Dr. Johney Frame.  She has a history of CAD status post cath in 2015 revealing a 70% nondominant RCA with a recent CTA that showed less than 50% proximal LAD stenosis.  She has treated hypertension and hyperlipidemia.  She has a strong family history of heart disease with both parents and 2 siblings who had CAD.  She is never had a heart attack or stroke.  She denies chest pain or shortness of breath.  She does complain of left calf lifestyle limiting claudication with recent Dopplers performed 07/21/2020 suggesting a high-grade mid left SFA stenosis.   Current Meds  Medication Sig  . aspirin EC 81 MG tablet Take 1 tablet (81 mg total) by mouth daily.  . Cholecalciferol (VITAMIN D PO) Take 1 tablet by mouth daily.   Marland Kitchen ezetimibe (ZETIA) 10 MG tablet Take 1 tablet (10 mg total) by mouth daily.  . fluticasone (FLONASE) 50 MCG/ACT nasal spray fluticasone propionate 50 mcg/actuation nasal spray,suspension  instill 1 spray into each nostril DAILY  . hydrochlorothiazide (HYDRODIURIL) 25 MG tablet Take 1 tablet (25 mg total) by mouth daily.  Marland Kitchen HYDROcodone-acetaminophen (NORCO) 10-325 MG tablet hydrocodone 10 mg-acetaminophen 325 mg tablet  TAKE ONE TABLET BY MOUTH 4 TIMES DAILY AS NEEDED  . LORazepam (ATIVAN) 0.5 MG tablet lorazepam 0.5 mg tablet  Take 1 tablet twice daily as needed for anxiety  . losartan (COZAAR) 100 MG tablet  Take 1 tablet (100 mg total) by mouth daily.  Marland Kitchen omeprazole (PRILOSEC) 40 MG capsule Take 1 capsule by mouth every day  . QUEtiapine (SEROQUEL) 25 MG tablet Take 1 tablet (25 mg total) by mouth at bedtime.  . sertraline (ZOLOFT) 50 MG tablet Take 50 mg by mouth daily.  . traMADol (ULTRAM) 50 MG tablet Take 50 mg by mouth every 6 (six) hours as needed. for pain     Allergies  Allergen Reactions  . Ciprofloxacin Rash  . Lisinopril Swelling  . Pneumococcal Vaccine Swelling  . Pravastatin Other (See Comments)    Muscle aches   . Amlodipine     swelling    Social History   Socioeconomic History  . Marital status: Married    Spouse name: Not on file  . Number of children: 3  . Years of education: Not on file  . Highest education level: Not on file  Occupational History  . Occupation: Disabled  Tobacco Use  . Smoking status: Former Smoker    Quit date: 03/19/1994    Years since quitting: 26.3  . Smokeless tobacco: Never Used  Vaping Use  . Vaping Use: Never used  Substance and Sexual Activity  . Alcohol use: No    Alcohol/week: 0.0 standard drinks  . Drug use: No  . Sexual activity: Not Currently    Birth control/protection: Surgical    Comment: 1st intercourse 75 yo-Fewer than 5 partners,des neg  Other Topics Concern  . Not on file  Social  History Narrative  . Not on file   Social Determinants of Health   Financial Resource Strain: Not on file  Food Insecurity: Not on file  Transportation Needs: Not on file  Physical Activity: Not on file  Stress: Not on file  Social Connections: Not on file  Intimate Partner Violence: Not on file     Review of Systems: General: negative for chills, fever, night sweats or weight changes.  Cardiovascular: negative for chest pain, dyspnea on exertion, edema, orthopnea, palpitations, paroxysmal nocturnal dyspnea or shortness of breath Dermatological: negative for rash Respiratory: negative for cough or wheezing Urologic: negative  for hematuria Abdominal: negative for nausea, vomiting, diarrhea, bright red blood per rectum, melena, or hematemesis Neurologic: negative for visual changes, syncope, or dizziness All other systems reviewed and are otherwise negative except as noted above.    Blood pressure (!) 142/66, pulse 66, height 5\' 5"  (1.651 m), weight 157 lb (71.2 kg).  General appearance: alert and no distress Neck: no adenopathy, no carotid bruit, no JVD, supple, symmetrical, trachea midline and thyroid not enlarged, symmetric, no tenderness/mass/nodules Lungs: clear to auscultation bilaterally Heart: regular rate and rhythm, S1, S2 normal, no murmur, click, rub or gallop Extremities: extremities normal, atraumatic, no cyanosis or edema Pulses: 2+ and symmetric Skin: Skin color, texture, turgor normal. No rashes or lesions Neurologic: Alert and oriented X 3, normal strength and tone. Normal symmetric reflexes. Normal coordination and gait  EKG not performed today  ASSESSMENT AND PLAN:   Peripheral arterial disease ([place]) Ms. Edmonds was referred to me by Dr. Wilmon Arms for PAD.  She has left calf claudication which is lifestyle limiting over the last several months.  Lower extremity arterial Doppler studies performed 07/21/2020 revealed a right ABI of 1.07 and a left of 0.85 with a high-frequency signal in her mid left SFA.  She wishes to proceed with angiography and endovascular therapy.      Lorretta Harp MD FACP,FACC,FAHA, [facility] 07/26/2020 10:51 AM

## 2020-07-26 NOTE — Assessment & Plan Note (Signed)
Amanda [name] was referred to me by Dr. Wilmon Arms for PAD.  She has left calf claudication which is lifestyle limiting over the last several months.  Lower extremity arterial Doppler studies performed 07/21/2020 revealed a right ABI of 1.07 and a left of 0.85 with a high-frequency signal in her mid left SFA.  She wishes to proceed with angiography and endovascular therapy.

## 2020-07-27 ENCOUNTER — Other Ambulatory Visit: Payer: Self-pay

## 2020-07-27 DIAGNOSIS — I739 Peripheral vascular disease, unspecified: Secondary | ICD-10-CM

## 2020-07-27 LAB — CBC
Hematocrit: 39.8 % (ref 34.0–46.6)
Hemoglobin: 13.4 g/dL (ref 11.1–15.9)
MCH: 32.4 pg (ref 26.6–33.0)
MCHC: 33.7 g/dL (ref 31.5–35.7)
MCV: 96 fL (ref 79–97)
Platelets: 262 10*3/uL (ref 150–450)
RBC: 4.14 x10E6/uL (ref 3.77–5.28)
RDW: 12.7 % (ref 11.7–15.4)
WBC: 5 10*3/uL (ref 3.4–10.8)

## 2020-07-27 LAB — BASIC METABOLIC PANEL
BUN/Creatinine Ratio: 9 — ABNORMAL LOW (ref 12–28)
BUN: 8 mg/dL (ref 8–27)
CO2: 23 mmol/L (ref 20–29)
Calcium: 9 mg/dL (ref 8.7–10.3)
Chloride: 101 mmol/L (ref 96–106)
Creatinine, Ser: 0.89 mg/dL (ref 0.57–1.00)
Glucose: 83 mg/dL (ref 65–99)
Potassium: 4.3 mmol/L (ref 3.5–5.2)
Sodium: 141 mmol/L (ref 134–144)
eGFR: 69 mL/min/{1.73_m2} (ref >59)

## 2020-07-27 MED ORDER — SODIUM CHLORIDE 0.9% FLUSH: 3.0000 mL | Freq: Two times a day (BID) | INTRAVENOUS | Status: DC

## 2020-07-28 ENCOUNTER — Telehealth: Payer: Self-pay | Admitting: *Deleted

## 2020-07-28 NOTE — Telephone Encounter (Signed)
Pt contacted pre-abdominal aortogram  scheduled at [facility] for: Monday Aug 01, 2020 7:30 AM Verified arrival time and place: [place] [facility]) at: 5:30 AM   No solid food after midnight prior to cath, clear liquids until 5 AM day of procedure.  Hold: HCTZ-AM of procedure  Except hold medications AM meds can be  taken pre-cath with sips of water including: ASA 81 mg   Confirmed patient has responsible adult to drive home post procedure and be with patient first 24 hours after arriving home: yes  You are allowed ONE visitor in the waiting room during the time you are at the hospital for your procedure. Both you and your visitor must wear a mask once you enter the hospital.   Reviewed procedure/mask/visitor instructions with patient.

## 2020-07-29 ENCOUNTER — Other Ambulatory Visit (HOSPITAL_COMMUNITY)
Admission: RE | Admit: 2020-07-29 | Discharge: 2020-07-29 | Disposition: A | Discharge status: Home / Self Care | Payer: Medicare Other | Source: Ambulatory Visit | Attending: Cardiovascular Disease | Admitting: Cardiovascular Disease

## 2020-07-29 DIAGNOSIS — Z20822 Contact with and (suspected) exposure to covid-19: Secondary | ICD-10-CM | POA: Insufficient documentation

## 2020-07-29 DIAGNOSIS — Z01812 Encounter for preprocedural laboratory examination: Secondary | ICD-10-CM | POA: Diagnosis present

## 2020-07-30 LAB — SARS CORONAVIRUS 2 (TAT 6-24 HRS): SARS Coronavirus 2: NEGATIVE

## 2020-08-01 ENCOUNTER — Other Ambulatory Visit (HOSPITAL_COMMUNITY): Payer: Self-pay

## 2020-08-01 ENCOUNTER — Encounter (HOSPITAL_COMMUNITY): Payer: Self-pay | Admitting: Cardiovascular Disease

## 2020-08-01 ENCOUNTER — Encounter (HOSPITAL_COMMUNITY)
Admission: RE | Disposition: A | Discharge status: Home / Self Care | Payer: Self-pay | Source: Home / Self Care | Attending: Cardiovascular Disease

## 2020-08-01 ENCOUNTER — Other Ambulatory Visit: Payer: Self-pay

## 2020-08-01 ENCOUNTER — Observation Stay (HOSPITAL_COMMUNITY)
Admission: RE | Admit: 2020-08-01 | Discharge: 2020-08-02 | Disposition: A | Discharge status: Home / Self Care | Payer: Medicare Other | Attending: Cardiovascular Disease | Admitting: Cardiovascular Disease

## 2020-08-01 DIAGNOSIS — Z79899 Other long term (current) drug therapy: Secondary | ICD-10-CM | POA: Insufficient documentation

## 2020-08-01 DIAGNOSIS — Z7982 Long term (current) use of aspirin: Secondary | ICD-10-CM | POA: Diagnosis not present

## 2020-08-01 DIAGNOSIS — Z87891 Personal history of nicotine dependence: Secondary | ICD-10-CM | POA: Insufficient documentation

## 2020-08-01 DIAGNOSIS — M549 Dorsalgia, unspecified: Secondary | ICD-10-CM | POA: Diagnosis present

## 2020-08-01 DIAGNOSIS — Z8679 Personal history of other diseases of the circulatory system: Secondary | ICD-10-CM | POA: Diagnosis not present

## 2020-08-01 DIAGNOSIS — E785 Hyperlipidemia, unspecified: Secondary | ICD-10-CM

## 2020-08-01 DIAGNOSIS — I2510 Atherosclerotic heart disease of native coronary artery without angina pectoris: Secondary | ICD-10-CM | POA: Diagnosis present

## 2020-08-01 DIAGNOSIS — I739 Peripheral vascular disease, unspecified: Principal | ICD-10-CM | POA: Insufficient documentation

## 2020-08-01 DIAGNOSIS — I70212 Atherosclerosis of native arteries of extremities with intermittent claudication, left leg: Secondary | ICD-10-CM | POA: Diagnosis not present

## 2020-08-01 DIAGNOSIS — I10 Essential (primary) hypertension: Secondary | ICD-10-CM | POA: Diagnosis present

## 2020-08-01 DIAGNOSIS — K219 Gastro-esophageal reflux disease without esophagitis: Secondary | ICD-10-CM | POA: Diagnosis present

## 2020-08-01 HISTORY — PX: ABDOMINAL AORTOGRAM W/LOWER EXTREMITY: CATH118223

## 2020-08-01 HISTORY — DX: Peripheral vascular disease, unspecified: I73.9

## 2020-08-01 HISTORY — PX: PERIPHERAL VASCULAR INTERVENTION: CATH118257

## 2020-08-01 LAB — CBC
HCT: 36.8 % (ref 36.0–46.0)
Hemoglobin: 12.6 g/dL (ref 12.0–15.0)
MCH: 32.9 pg (ref 26.0–34.0)
MCHC: 34.2 g/dL (ref 30.0–36.0)
MCV: 96.1 fL (ref 80.0–100.0)
Platelets: 219 10*3/uL (ref 150–400)
RBC: 3.83 MIL/uL — ABNORMAL LOW (ref 3.87–5.11)
RDW: 12.5 % (ref 11.5–15.5)
WBC: 5.9 10*3/uL (ref 4.0–10.5)
nRBC: 0 % (ref 0.0–0.2)

## 2020-08-01 LAB — POCT ACTIVATED CLOTTING TIME
Activated Clotting Time: 231 seconds
Activated Clotting Time: 255 seconds

## 2020-08-01 LAB — CREATININE, SERUM
Creatinine, Ser: 0.7 mg/dL (ref 0.44–1.00)
GFR, Estimated: >60 mL/min (ref >60)

## 2020-08-01 SURGERY — ABDOMINAL AORTOGRAM W/LOWER EXTREMITY
Anesthesia: LOCAL

## 2020-08-01 MED ORDER — FENTANYL CITRATE (PF) 100 MCG/2ML IJ SOLN
INTRAMUSCULAR | Status: DC | PRN
  Administered 2020-08-01 (×2): 25 ug via INTRAVENOUS

## 2020-08-01 MED ORDER — HEPARIN (PORCINE) IN NACL 1000-0.9 UT/500ML-% IV SOLN
INTRAVENOUS | Status: DC | PRN
  Administered 2020-08-01: 500 mL

## 2020-08-01 MED ORDER — PANTOPRAZOLE SODIUM 40 MG PO TBEC
40.0000 mg | DELAYED_RELEASE_TABLET | Freq: Every day | ORAL | 2 refills | Status: DC
  Filled 2020-08-01: qty 60, 60d supply, fill #0

## 2020-08-01 MED ORDER — SODIUM CHLORIDE 0.9 % WEIGHT BASED INFUSION
3.0000 mL/kg/h | INTRAVENOUS | Status: DC
Start: 2020-08-01 — End: 2020-08-01
  Administered 2020-08-01: 3 mL/kg/h via INTRAVENOUS

## 2020-08-01 MED ORDER — HEPARIN SODIUM (PORCINE) 1000 UNIT/ML IJ SOLN
INTRAMUSCULAR | Status: AC
  Filled 2020-08-01: qty 1

## 2020-08-01 MED ORDER — HEPARIN (PORCINE) IN NACL 1000-0.9 UT/500ML-% IV SOLN
INTRAVENOUS | Status: AC
  Filled 2020-08-01: qty 1000

## 2020-08-01 MED ORDER — HEPARIN SODIUM (PORCINE) 5000 UNIT/ML IJ SOLN
5000.0000 [IU] | Freq: Three times a day (TID) | INTRAMUSCULAR | Status: DC
  Administered 2020-08-02: 5000 [IU] via SUBCUTANEOUS
  Filled 2020-08-01: qty 1

## 2020-08-01 MED ORDER — ONDANSETRON HCL 4 MG/2ML IJ SOLN: 4.0000 mg | Freq: Four times a day (QID) | INTRAMUSCULAR | Status: DC | PRN

## 2020-08-01 MED ORDER — CLOPIDOGREL BISULFATE 300 MG PO TABS
ORAL_TABLET | ORAL | Status: AC
  Filled 2020-08-01: qty 1

## 2020-08-01 MED ORDER — HEPARIN SODIUM (PORCINE) 1000 UNIT/ML IJ SOLN
INTRAMUSCULAR | Status: DC | PRN
  Administered 2020-08-01: 3000 [IU] via INTRAVENOUS
  Administered 2020-08-01: 7500 [IU] via INTRAVENOUS
  Administered 2020-08-01: 2500 [IU] via INTRAVENOUS

## 2020-08-01 MED ORDER — ACETAMINOPHEN 325 MG PO TABS: 650.0000 mg | ORAL_TABLET | ORAL | Status: DC | PRN

## 2020-08-01 MED ORDER — SODIUM CHLORIDE 0.9 % IV SOLN: INTRAVENOUS | Status: AC

## 2020-08-01 MED ORDER — MORPHINE SULFATE (PF) 2 MG/ML IV SOLN: 2.0000 mg | INTRAVENOUS | Status: DC | PRN

## 2020-08-01 MED ORDER — ASPIRIN 81 MG PO CHEW: 81.0000 mg | CHEWABLE_TABLET | ORAL | Status: DC

## 2020-08-01 MED ORDER — LIDOCAINE HCL (PF) 1 % IJ SOLN
INTRAMUSCULAR | Status: DC | PRN
  Administered 2020-08-01: 30 mL

## 2020-08-01 MED ORDER — MIDAZOLAM HCL 2 MG/2ML IJ SOLN
INTRAMUSCULAR | Status: DC | PRN
  Administered 2020-08-01: 1 mg via INTRAVENOUS

## 2020-08-01 MED ORDER — ASPIRIN EC 81 MG PO TBEC: 81.0000 mg | DELAYED_RELEASE_TABLET | Freq: Every day | ORAL | Status: DC

## 2020-08-01 MED ORDER — CLOPIDOGREL BISULFATE 300 MG PO TABS
ORAL_TABLET | ORAL | Status: DC | PRN
  Administered 2020-08-01: 300 mg via ORAL

## 2020-08-01 MED ORDER — SODIUM CHLORIDE 0.9 % IV SOLN: 250.0000 mL | INTRAVENOUS | Status: DC | PRN

## 2020-08-01 MED ORDER — LOSARTAN POTASSIUM 50 MG PO TABS
100.0000 mg | ORAL_TABLET | Freq: Every day | ORAL | Status: DC
  Administered 2020-08-02: 100 mg via ORAL
  Filled 2020-08-01: qty 2

## 2020-08-01 MED ORDER — LIDOCAINE HCL (PF) 1 % IJ SOLN
INTRAMUSCULAR | Status: AC
  Filled 2020-08-01: qty 30

## 2020-08-01 MED ORDER — HYDRALAZINE HCL 20 MG/ML IJ SOLN: 5.0000 mg | INTRAMUSCULAR | Status: DC | PRN

## 2020-08-01 MED ORDER — ASPIRIN EC 81 MG PO TBEC
81.0000 mg | DELAYED_RELEASE_TABLET | Freq: Every day | ORAL | Status: DC
  Administered 2020-08-02: 81 mg via ORAL
  Filled 2020-08-01: qty 1

## 2020-08-01 MED ORDER — SODIUM CHLORIDE 0.9% FLUSH: 3.0000 mL | INTRAVENOUS | Status: DC | PRN

## 2020-08-01 MED ORDER — ACETAMINOPHEN 325 MG PO TABS
650.0000 mg | ORAL_TABLET | ORAL | Status: DC | PRN
  Administered 2020-08-01: 650 mg via ORAL
  Filled 2020-08-01: qty 2

## 2020-08-01 MED ORDER — SODIUM CHLORIDE 0.9 % WEIGHT BASED INFUSION
1.0000 mL/kg/h | INTRAVENOUS | Status: DC
  Administered 2020-08-01: 1 mL/kg/h via INTRAVENOUS

## 2020-08-01 MED ORDER — LABETALOL HCL 5 MG/ML IV SOLN: 10.0000 mg | INTRAVENOUS | Status: DC | PRN

## 2020-08-01 MED ORDER — IODIXANOL 320 MG/ML IV SOLN
INTRAVENOUS | Status: DC | PRN
  Administered 2020-08-01: 155 mL

## 2020-08-01 MED ORDER — SODIUM CHLORIDE 0.9% FLUSH
3.0000 mL | Freq: Two times a day (BID) | INTRAVENOUS | Status: DC
  Administered 2020-08-01 – 2020-08-02 (×2): 3 mL via INTRAVENOUS

## 2020-08-01 MED ORDER — HYDROCHLOROTHIAZIDE 25 MG PO TABS
25.0000 mg | ORAL_TABLET | Freq: Every day | ORAL | Status: DC
  Administered 2020-08-02: 25 mg via ORAL
  Filled 2020-08-01: qty 1

## 2020-08-01 MED ORDER — CLOPIDOGREL BISULFATE 75 MG PO TABS
75.0000 mg | ORAL_TABLET | Freq: Every day | ORAL | Status: DC
  Administered 2020-08-02: 75 mg via ORAL
  Filled 2020-08-01: qty 1

## 2020-08-01 MED ORDER — CLOPIDOGREL BISULFATE 75 MG PO TABS
75.0000 mg | ORAL_TABLET | Freq: Every day | ORAL | 3 refills | Status: DC
  Filled 2020-08-01: qty 90, 90d supply, fill #0

## 2020-08-01 MED ORDER — MIDAZOLAM HCL 2 MG/2ML IJ SOLN
INTRAMUSCULAR | Status: AC
  Filled 2020-08-01: qty 2

## 2020-08-01 MED ORDER — FENTANYL CITRATE (PF) 100 MCG/2ML IJ SOLN
INTRAMUSCULAR | Status: AC
  Filled 2020-08-01: qty 2

## 2020-08-01 MED ORDER — NITROGLYCERIN 0.4 MG SL SUBL: 0.4000 mg | SUBLINGUAL_TABLET | SUBLINGUAL | Status: DC | PRN

## 2020-08-01 MED ORDER — EZETIMIBE 10 MG PO TABS
10.0000 mg | ORAL_TABLET | Freq: Every day | ORAL | Status: DC
  Administered 2020-08-02: 10 mg via ORAL
  Filled 2020-08-01: qty 1

## 2020-08-01 SURGICAL SUPPLY — 19 items
BALLN ADMIRAL INPACT 5X200 (BALLOONS) ×3
BALLOON ADMIRAL INPACT 5X200 (BALLOONS) ×1 IMPLANT
CATH ANGIO 5F PIGTAIL 65CM (CATHETERS) ×2 IMPLANT
CATH CROSS OVER TEMPO 5F (CATHETERS) ×1 IMPLANT
CATH HAWKONE LX EXTENDED TIP (CATHETERS) ×1 IMPLANT
CATH STRAIGHT 5FR 65CM (CATHETERS) ×1 IMPLANT
CLOSURE MYNX CONTROL 6F/7F (Vascular Products) ×2 IMPLANT
DEVICE SPIDERFX EMB PROT 6MM (WIRE) ×2 IMPLANT
GUIDEWIRE ZILIENT 6G 014 (WIRE) ×1 IMPLANT
KIT ENCORE 26 ADVANTAGE (KITS) ×1 IMPLANT
KIT PV (KITS) ×3 IMPLANT
SHEATH HIGHFLEX ANSEL 7FR 55CM (SHEATH) ×1 IMPLANT
SHEATH PINNACLE 5F 10CM (SHEATH) ×1 IMPLANT
SHEATH PINNACLE 7F 10CM (SHEATH) ×1 IMPLANT
SHEATH PROBE COVER 6X72 (BAG) ×2 IMPLANT
SYR MEDRAD MARK 7 150ML (SYRINGE) ×3 IMPLANT
TRANSDUCER W/STOPCOCK (MISCELLANEOUS) ×3 IMPLANT
TRAY PV CATH (CUSTOM PROCEDURE TRAY) ×3 IMPLANT
WIRE HITORQ VERSACORE ST 145CM (WIRE) ×1 IMPLANT

## 2020-08-01 NOTE — Interval H&P Note (Signed)
History and Physical Interval Note:  08/01/2020 7:37 AM  Amanda [name]  has presented today for surgery, with the diagnosis of claudication.  The various methods of treatment have been discussed with the patient and family. After consideration of risks, benefits and other options for treatment, the patient has consented to  Procedure(s): ABDOMINAL AORTOGRAM W/LOWER EXTREMITY (N/A) as a surgical intervention.  The patient's history has been reviewed, patient examined, no change in status, stable for surgery.  I have reviewed the patient's chart and labs.  Questions were answered to the patient's satisfaction.     Quay Burow

## 2020-08-01 NOTE — Progress Notes (Signed)
Dressing changed right groin and pressure held x 25min and pressure dressing placed; right groin soft no hematoma and no oozing at present; report called and client transferred via bed to room 6-E-5

## 2020-08-01 NOTE — Progress Notes (Signed)
Dressing changed right groin and pressure held x 3min and cont oozing

## 2020-08-01 NOTE — Progress Notes (Signed)
Dressing changed right groin and cont. sm amt oozing

## 2020-08-01 NOTE — Progress Notes (Signed)
Amanda [name], [place] notified client with oozing noted right groin and she will be in to see client

## 2020-08-01 NOTE — Progress Notes (Signed)
    Called by RN regarding femoral access site with persistent oozing despite manual pressure and pressure dressing. On assessment site is without hematoma. Groin is soft to touch and patient with no pain. VSS. She had Mynx closure device of the right femoral artery post procedure. On follow up, she continues to have slow oozing at the site. Plan discussed with Dr. Gwenlyn Found who wishes to keep the patient overnight for observation. Orders placed.   Kathyrn Drown NP-C [place] Pager: [phone]

## 2020-08-01 NOTE — Discharge Instructions (Signed)
Femoral Site Care  This sheet gives you information about how to care for yourself after your procedure. Your health care provider may also give you more specific instructions. If you have problems or questions, contact your health care provider. What can I expect after the procedure? After the procedure, it is common to have:  Bruising that usually fades within 1-2 weeks.  Tenderness at the site. Follow these instructions at home: Wound care  Follow instructions from your health care provider about how to take care of your insertion site. Make sure you: ? Wash your hands with soap and water before you change your bandage (dressing). If soap and water are not available, use hand sanitizer. ? Change your dressing as told by your health care provider. ? Leave stitches (sutures), skin glue, or adhesive strips in place. These skin closures may need to stay in place for 2 weeks or longer. If adhesive strip edges start to loosen and curl up, you may trim the loose edges. Do not remove adhesive strips completely unless your health care provider tells you to do that.  Do not take baths, swim, or use a hot tub until your health care provider approves.  You may shower 24-48 hours after the procedure or as told by your health care provider. ? Gently wash the site with plain soap and water. ? Pat the area dry with a clean towel. ? Do not rub the site. This may cause bleeding.  Do not apply powder or lotion to the site. Keep the site clean and dry.  Check your femoral site every day for signs of infection. Check for: ? Redness, swelling, or pain. ? Fluid or blood. ? Warmth. ? Pus or a bad smell. Activity  For the first 2-3 days after your procedure, or as long as directed: ? Avoid climbing stairs as much as possible. ? Do not squat.  Do not lift anything that is heavier than 10 lb (4.5 kg), or the limit that you are told, until your health care provider says that it is safe.  Rest as  directed. ? Avoid sitting for a long time without moving. Get up to take short walks every 1-2 hours.  Do not drive for 24 hours if you were given a medicine to help you relax (sedative). General instructions  Take over-the-counter and prescription medicines only as told by your health care provider.  Keep all follow-up visits as told by your health care provider. This is important. Contact a health care provider if you have:  A fever or chills.  You have redness, swelling, or pain around your insertion site. Get help right away if:  The catheter insertion area swells very fast.  You pass out.  You suddenly start to sweat or your skin gets clammy.  The catheter insertion area is bleeding, and the bleeding does not stop when you hold steady pressure on the area.  The area near or just beyond the catheter insertion site becomes pale, cool, tingly, or numb. These symptoms may represent a serious problem that is an emergency. Do not wait to see if the symptoms will go away. Get medical help right away. Call your local emergency services (911 in the [place] Do not drive yourself to the hospital. Summary  After the procedure, it is common to have bruising that usually fades within 1-2 weeks.  Check your femoral site every day for signs of infection.  Do not lift anything that is heavier than 10 lb (4.5 kg), or   the limit that you are told, until your health care provider says that it is safe. This information is not intended to replace advice given to you by your health care provider. Make sure you discuss any questions you have with your health care provider. Document Revised: 11/06/2019 Document Reviewed: 11/06/2019 Elsevier Patient Education  [place] DO NOT MISS ANY DOSES OF YOUR PLAVIX!!!!! Also keep a log of you blood pressures and bring back to your follow up appt. Please call the office with any questions.   Patients taking blood thinners should generally  stay away from medicines like ibuprofen, Advil, Motrin, naproxen, and Aleve due to risk of stomach bleeding. You may take Tylenol as directed or talk to your primary doctor about alternatives.  Some studies suggest Prilosec/Omeprazole interacts with Plavix. We changed your Prilosec/Omeprazole to the equivalent dose of Protonix for less chance of interaction.  PLEASE ENSURE THAT YOU DO NOT RUN OUT OF YOUR PLAVIX. IF you have issues obtaining this medication due to cost please CALL the office 3-5 business days prior to running out in order to prevent missing doses of this medication.

## 2020-08-02 ENCOUNTER — Encounter (HOSPITAL_COMMUNITY): Payer: Self-pay | Admitting: Cardiovascular Disease

## 2020-08-02 ENCOUNTER — Telehealth (HOSPITAL_COMMUNITY): Payer: Self-pay

## 2020-08-02 DIAGNOSIS — I10 Essential (primary) hypertension: Secondary | ICD-10-CM | POA: Diagnosis not present

## 2020-08-02 DIAGNOSIS — Z8679 Personal history of other diseases of the circulatory system: Secondary | ICD-10-CM | POA: Diagnosis not present

## 2020-08-02 DIAGNOSIS — E785 Hyperlipidemia, unspecified: Secondary | ICD-10-CM

## 2020-08-02 DIAGNOSIS — I739 Peripheral vascular disease, unspecified: Secondary | ICD-10-CM

## 2020-08-02 DIAGNOSIS — Z7982 Long term (current) use of aspirin: Secondary | ICD-10-CM | POA: Diagnosis not present

## 2020-08-02 HISTORY — DX: Hyperlipidemia, unspecified: E78.5

## 2020-08-02 LAB — CBC
HCT: 34.8 % — ABNORMAL LOW (ref 36.0–46.0)
Hemoglobin: 11.4 g/dL — ABNORMAL LOW (ref 12.0–15.0)
MCH: 32.2 pg (ref 26.0–34.0)
MCHC: 32.8 g/dL (ref 30.0–36.0)
MCV: 98.3 fL (ref 80.0–100.0)
Platelets: 207 10*3/uL (ref 150–400)
RBC: 3.54 MIL/uL — ABNORMAL LOW (ref 3.87–5.11)
RDW: 12.5 % (ref 11.5–15.5)
WBC: 4.7 10*3/uL (ref 4.0–10.5)
nRBC: 0 % (ref 0.0–0.2)

## 2020-08-02 LAB — BASIC METABOLIC PANEL
Anion gap: 5 (ref 5–15)
BUN: 12 mg/dL (ref 8–23)
CO2: 28 mmol/L (ref 22–32)
Calcium: 8.3 mg/dL — ABNORMAL LOW (ref 8.9–10.3)
Chloride: 107 mmol/L (ref 98–111)
Creatinine, Ser: 0.78 mg/dL (ref 0.44–1.00)
GFR, Estimated: >60 mL/min (ref >60)
Glucose, Bld: 99 mg/dL (ref 70–99)
Potassium: 3.4 mmol/L — ABNORMAL LOW (ref 3.5–5.1)
Sodium: 140 mmol/L (ref 135–145)

## 2020-08-02 MED ORDER — POTASSIUM CHLORIDE CRYS ER 20 MEQ PO TBCR
40.0000 meq | EXTENDED_RELEASE_TABLET | Freq: Once | ORAL | Status: AC
  Administered 2020-08-02: 40 meq via ORAL
  Filled 2020-08-02: qty 2

## 2020-08-02 MED ORDER — TRAMADOL HCL 50 MG PO TABS
50.0000 mg | ORAL_TABLET | Freq: Once | ORAL | Status: AC
  Administered 2020-08-02: 50 mg via ORAL
  Filled 2020-08-02: qty 1

## 2020-08-02 NOTE — Progress Notes (Signed)
Pt is reporting right side hip/back pain. She takes Tramadol at home. Paged cardiology. Confirmed with cardiology on the phone that he pt is experiencing same back/hip pain that she was experiencing prior to admission. Pt reports she has a hour ride home and would like something to help with pain before discharge.

## 2020-08-02 NOTE — Discharge Summary (Addendum)
Discharge Summary    Patient ID: Amanda [name] MRN: [id number]; DOB: 1945/07/01  Admit date: 08/01/2020 Discharge date: 08/02/2020  PCP:  [name], [name], NP   [facility] Providers Cardiologist:  Freada Bergeron, MD        Discharge Diagnoses    Principal Problem:   Peripheral arterial disease [facility]) Active Problems:   Back pain   Essential hypertension   GERD (gastroesophageal reflux disease)   CAD (coronary artery disease)   Hyperlipidemia    Diagnostic Studies/Procedures    PV Angiogram 08/01/2020: Angiographic Data:  1: Abdominal aorta- the renal arteries are widely patent.  There was a small saccular infrarenal abdominal aortic aneurysm just above the iliac bifurcation. 2: Left lower extremity- there was a 50 to 60% segmental mid left SFA stenosis with a very focal 95% stenosis within this.  There is two-vessel runoff with an occluded anterior tibial artery. 3: Right lower extremity- 50% segmental mid right SFA with two-vessel runoff.  The posterior tibial was occluded.  Impression: Ms. Nauman has a long moderately stenosed mid left SFA segment with a high-grade lesion in the of the middle of this. We will proceed with [facility] 1 directional atherectomy followed by drug-coated balloon angioplasty using spider distal protection.  Final Impression: Successful Hawk 1 directional atherectomy followed by drug-coated balloon angioplasty of a diseased mid left SFA using spider distal protection in the setting of lifestyle limiting claudication.  Patient tolerated procedure well.  Hemostasis was obtained with a Mynx device.  The patient left lab in stable condition.  She received 300 mg of p.o. Plavix.  If her groin remained stable this afternoon she can be discharged home.  We will obtain lower extremity arterial Doppler studies in our [facility] line office next week and I will see her back the following week in follow-up. _____________   History of Present Illness      Amanda [name] is a 75 y.o. female with a history of non-obstructive CAD, PAD, hypertension, hyperlipidemia, GERD, and chronic back pain who was recently referred to Dr. Gwenlyn Found for lifestyle limiting claudication. Lower extremity arterial doppler studies with ABI on 5/52022 showed right ABI of 1.07 and left ABI of 0.86 with a high-frequency signal in her mid left SFA. Patient was seen by Dr. Gwenlyn Found on 07/26/2020 and decision was made to proceed with PV angiography and endovascular therapy.  Hospital Course     Consultants: None  Patient presented to [facility] on 08/01/2020 for peripheral angiography as stated above. She was found to have a long moderately stenosis mid left SFTA with a high-grade lesion in the middle of this. She underwent successful Hawk 1 direction atherectomy followed by drug-coated balloon angioplasty of the diseased mid left SFA. Patient tolerated the procedure well. Hemostasis was obtained with a Mynx device. She did have some oozing from femoral cath site following procedure so decision was made to keep her overnight for observation. Hemoglobin 11.4 this morning (12.6 yesterday). Femoral cath site is soft with no signs of hematoma. She was started on DAPT with Aspirin and Plavix. Home Omeprazole was stopped and she was started on Plavix instead. Can continue all other home medications at discharge. She already has repeat lower extremity dopplers scheduled for 08/05/2020 and follow-up with Dr. Gwenlyn Found scheduled for 08/24/2020.  Potassium slightly low at 3.4 today. Will supplement with K-Dur prior to discharge.  Of note, recent lipid panel in 06/2020 showed LDL of 188. She has been intolerant to statins in the past.  She was started on Zetia at that time and referred to the lipid clinic for consideration of PCSK9 inhibitor.  Patient seen and examined by Dr. Burt Knack today and determined to be stable for discharge. Outpatient follow-up arranged. Medications as below.  Did the patient have  an acute coronary syndrome (MI, NSTEMI, STEMI, etc) this admission?:  No                               Did the patient have a percutaneous coronary intervention (stent / angioplasty)?:  No.      _____________  Discharge Vitals Blood pressure (!) 153/63, pulse 65, temperature 97.7 F (36.5 C), temperature source Oral, resp. rate 18, SpO2 99 %.  There were no vitals filed for this visit.   General: 75 y.o. female resting comfortably in no acute distress. HEENT: Normocephalic and atraumatic. Sclera clear.  Neck: Supple. No JVD. Heart: RRR. Distinct S1 and S2. No murmurs, gallops, or rubs. Right femoral cath site soft with no signs of hematoma. Lungs: No increased work of breathing. Clear to ausculation bilaterally. No wheezes, rhonchi, or rales.  Abdomen: Soft, non-distended, and non-tender to palpation.  Extremities: No lower extremity edema.    Skin: Warm and dry. Neuro: Alert and oriented x3. No focal deficits. Psych: Normal affect. Responds appropriately.   Labs & Radiologic Studies    CBC Recent Labs    08/01/20 1818 08/02/20 0229  WBC 5.9 4.7  HGB 12.6 11.4*  HCT 36.8 34.8*  MCV 96.1 98.3  PLT 219 828   Basic Metabolic Panel Recent Labs    08/01/20 1818 08/02/20 0229  NA  --  140  K  --  3.4*  CL  --  107  CO2  --  28  GLUCOSE  --  99  BUN  --  12  CREATININE 0.70 0.78  CALCIUM  --  8.3*   Liver Function Tests No results for input(s): AST, ALT, ALKPHOS, BILITOT, PROT, ALBUMIN in the last 72 hours. No results for input(s): LIPASE, AMYLASE in the last 72 hours. High Sensitivity Troponin:   No results for input(s): TROPONINIHS in the last 720 hours.  BNP Invalid input(s): POCBNP D-Dimer No results for input(s): DDIMER in the last 72 hours. Hemoglobin A1C No results for input(s): HGBA1C in the last 72 hours. Fasting Lipid Panel No results for input(s): CHOL, HDL, LDLCALC, TRIG, CHOLHDL, LDLDIRECT in the last 72 hours. Thyroid Function Tests No results for  input(s): TSH, T4TOTAL, T3FREE, THYROIDAB in the last 72 hours.  Invalid input(s): FREET3 _____________  DG Lumbar Spine 2-3 Views  Result Date: 07/15/2020 CLINICAL DATA:  Chronic low back pain without known injury. EXAM: LUMBAR SPINE - 2-3 VIEW COMPARISON:  None. FINDINGS: No fracture or spondylolisthesis is noted. Diffuse osteopenia is noted. Moderate degenerative disc disease is noted at L1-2. Remaining disc spaces are unremarkable. IMPRESSION: Moderate degenerative changes.  No acute abnormality is seen. Aortic Atherosclerosis (ICD10-I70.0). Electronically Signed   By: Marijo Conception M.D.   On: 07/15/2020 16:53   DG Tibia/Fibula Left  Result Date: 07/15/2020 CLINICAL DATA:  Left leg pain for several months. EXAM: LEFT TIBIA AND FIBULA - 2 VIEW COMPARISON:  None. FINDINGS: There is no evidence of fracture or other focal bone lesions. Soft tissues are unremarkable. IMPRESSION: Negative. Electronically Signed   By: Marijo Conception M.D.   On: 07/15/2020 16:51   PERIPHERAL VASCULAR CATHETERIZATION  Result Date: 08/02/2020  [id number]  LOCATION:  FACILITY: [facility] PHYSICIAN: Quay Burow, M.D. 1945-05-29 DATE OF PROCEDURE:  08/01/2020 DATE OF DISCHARGE: PV Angiogram/Intervention History obtained from chart review.Amanda [name] is a 75 y.o. recently widowed (husband of 18 years died a month ago) Caucasian female mother of 2 living children (1 child deceased, died of an accident), grandmother of 36 grandchildren, great grandmother of 20 great-grandchildren is retired from working in a Special educational needs teacher.  She was referred for lifestyle limiting claudication.  Her primary care provider is Dr. Sherene Sires and cardiologist Dr. Johney Frame.  She has a history of CAD status post cath in 2015 revealing a 70% nondominant RCA with a recent CTA that showed less than 50% proximal LAD stenosis.  She has treated hypertension and hyperlipidemia.  She has a strong family history of heart disease with both parents and 2 siblings  who had CAD.  She is never had a heart attack or stroke.  She denies chest pain or shortness of breath.  She does complain of left calf lifestyle limiting claudication with recent Dopplers performed 07/21/2020 suggesting a high-grade mid left SFA stenosis. Pre Procedure Diagnosis: Peripheral arterial disease Post Procedure Diagnosis: Peripheral arterial disease Operators: Dr. Quay Burow Procedures Performed:  1.  Ultrasound-guided right common femoral access  2.  Abdominal aortogram/bilateral iliac angiogram/bifemoral runoff  3.  Contralateral access (secondary cath placement)  4.  Placement of spider distal protection device left above-the-knee popliteal artery  5.  Hawk 1 directional atherectomy left SFA  6.  DCB left SFA  7.  Right common femoral angiogram followed by successful deployment of a Mynx closure device. PROCEDURE DESCRIPTION: The patient was brought to the second floor [facility] cath lab in the the postabsorptive state. She was premedicated with IV Versed and fentanyl. Her right groin was prepped and shaved in usual sterile fashion. Xylocaine 1% was used for local anesthesia. A 5 French sheath was inserted into the right common femoral artery using standard Seldinger technique.  Ultrasound was used to identify the right common femoral artery and guide access.  A digital image was captured and placed in the patient's chart.  A 5 French pigtail catheters placed in the distal abdominal aorta.  Distal abdominal aortography, bilateral iliac angiography with bifemoral runoff was performed using bolus chase, digital digital subtraction and step table technique.  Omnipaque dye was used for the entirety of the case (155 cc total administered to patient).  Retrograde aortic pressures monitored during the case.  Angiographic Data: 1: Abdominal aorta- the renal arteries are widely patent.  There was a small saccular infrarenal abdominal aortic aneurysm just above the iliac bifurcation. 2: Left lower  extremity- there was a 50 to 60% segmental mid left SFA stenosis with a very focal 95% stenosis within this.  There is two-vessel runoff with an occluded anterior tibial artery. 3: Right lower extremity- 50% segmental mid right SFA with two-vessel runoff.  The posterior tibial was occluded.   Ms. Rencher has a long moderately stenosed mid left SFA segment with a high-grade lesion in the of the middle of this.  We will proceed with [facility] 1 directional atherectomy followed by drug-coated balloon angioplasty using spider distal protection. Procedure Description: Contralateral access was obtained with a 7 [place] 55 cm multipurpose Ansell sheath.  Therapeutic ACT was demonstrated at 255 after the administration of 13 thousandths of heparin.  On the pigtail was used for the entirety of the case.  Retroaortic pressures monitored during the case. Once therapeutic ACT was demonstrated I crossed  the diseased segment with a 0.14/300 cm long 6 g Zilient wire.  Following this I placed a 6 mm spider distal protection device in the left above-the-knee popliteal artery.  I then performed [facility] 1 directional atherectomy (LX device) and an performed multiple circumferential cuts removing a copious amount of atherosclerotic plaque.  Following this I performed drug-coated balloon angioplasty with a 5 mm x 200 mm long impact Admiral drug-coated balloon at 3 atm with complete balloon expansion.  The balloon was left inflated for 20 half minutes.  It was then withdrawn and completion angiography was performed.  Spider distal protection device was then recaptured and the sheath was drawn over the bifurcation with a 035 versa core wire.  This was then exchanged for a short 7 [place] sheath.  A right common femoral angiogram was performed and a MYNX closure device successfully deployed. Final Impression: Successful Hawk 1 directional atherectomy followed by drug-coated balloon angioplasty of a diseased mid left SFA using spider distal  protection in the setting of lifestyle limiting claudication.  Patient tolerated procedure well.  Hemostasis was obtained with a Mynx device.  The patient left lab in stable condition.  She received 300 mg of p.o. Plavix.  If her groin remained stable this afternoon she can be discharged home.  We will obtain lower extremity arterial Doppler studies in our [facility] line office next week and I will see her back the following week in follow-up. Quay Burow. MD, [facility] 08/01/2020 9:28 AM   VAS [place] LE ART SEG MULTI (Segm&LE Reynauds)  Result Date: 07/21/2020  LOWER EXTREMITY DOPPLER STUDY Patient Name:  Amanda [name]  Date of Exam:   07/18/2020 Medical Rec #: [id number]        Accession #:    [id number] Date of Birth: [birth year]        Patient Gender: F Patient Age:   50Y Exam Location:  [facility] Procedure:      VAS [place] LOWER EXT ART SEG MULTI (SEGMENTALS & LE RAYNAUDS) Referring Phys: 39 EVAN S COREY --------------------------------------------------------------------------------  Indications: Claudication, and Patient has had left anterior shin pain when              walking up inclines for about 3 months. She denies rest pains. High Risk Factors: Hypertension, hyperlipidemia, past history of smoking, prior                    MI, coronary artery disease.  Performing Technologist: Salvadore Dom RVT  Examination Guidelines: A complete evaluation includes at minimum, Doppler waveform signals and systolic blood pressure reading at the level of bilateral brachial, anterior tibial, and posterior tibial arteries, when vessel segments are accessible. Bilateral testing is considered an integral part of a complete examination. Photoelectric Plethysmograph (PPG) waveforms and toe systolic pressure readings are included as required and additional duplex testing as needed. Limited examinations for reoccurring indications may be performed as noted.  ABI Findings: +---------+------------------+-----+----------------+--------+  Right    Rt Pressure (mmHg)IndexWaveform        Comment  +---------+------------------+-----+----------------+--------+ Brachial 168                                             +---------+------------------+-----+----------------+--------+ CFA                             triphasic                +---------+------------------+-----+----------------+--------+  Popliteal                       barely triphasic         +---------+------------------+-----+----------------+--------+ ATA      160               0.95 triphasic                +---------+------------------+-----+----------------+--------+ PTA      179               1.07 triphasic                +---------+------------------+-----+----------------+--------+ PERO     160               0.95 triphasic                +---------+------------------+-----+----------------+--------+ Great Toe77                0.46 Normal                   +---------+------------------+-----+----------------+--------+ +---------+------------------+-----+----------+-------+ Left     Lt Pressure (mmHg)IndexWaveform  Comment +---------+------------------+-----+----------+-------+ Brachial 163                                      +---------+------------------+-----+----------+-------+ CFA                             biphasic          +---------+------------------+-----+----------+-------+ Popliteal                       monophasic        +---------+------------------+-----+----------+-------+ ATA      119               0.71 monophasic        +---------+------------------+-----+----------+-------+ PTA      142               0.85 monophasic        +---------+------------------+-----+----------+-------+ PERO     132               0.79 monophasic        +---------+------------------+-----+----------+-------+ Great Toe66                0.39 Abnormal           +---------+------------------+-----+----------+-------+ +-------+-----------+-----------+------------+------------+ ABI/TBIToday's ABIToday's TBIPrevious ABIPrevious TBI +-------+-----------+-----------+------------+------------+ Right  1.07       .46                                 +-------+-----------+-----------+------------+------------+ Left   .85        .39                                 +-------+-----------+-----------+------------+------------+   Summary: Right: Resting right ankle-brachial index is within normal range. No evidence of significant right lower extremity arterial disease. The right toe-brachial index is abnormal. Left: Resting left ankle-brachial index indicates mild left lower extremity arterial disease. The left toe-brachial index is abnormal.  *See table(s) above for measurements and observations.  Patient scheduled for bilateral leg arterial duplex on 07/21/2020. Electronically signed by Larae Grooms MD on 07/21/2020  at 8:16:37 AM.    Final    VAS [place] LOWER EXTREMITY ARTERIAL DUPLEX  Result Date: 07/21/2020 LOWER EXTREMITY ARTERIAL DUPLEX STUDY Patient Name:  Amanda [name]  Date of Exam:   07/21/2020 Medical Rec #: [id number]        Accession #:    [id number] Date of Birth: [birth year]        Patient Gender: F Patient Age:   18Y Exam Location:  [facility] Procedure:      VAS [place] LOWER EXTREMITY ARTERIAL DUPLEX Referring Phys: [id number] [place] --------------------------------------------------------------------------------  Indications: Claudication, and peripheral artery disease. High Risk Factors: Hypertension, past history of smoking, coronary artery                    disease. Other Factors: Patient complains of left leg pain when when walking up to 100                feet.Patient rests until pain subsides.  Current ABI: Right ABI 1.07Left .48 Performing Technologist: Leavy Cella RDCS Supporting Technologist: Wilkie Aye RVT  Examination Guidelines: A complete  evaluation includes B-mode imaging, spectral Doppler, color Doppler, and power Doppler as needed of all accessible portions of each vessel. Bilateral testing is considered an integral part of a complete examination. Limited examinations for reoccurring indications may be performed as noted.  +----------+--------+-----+---------------+---------+--------+ RIGHT     PSV cm/sRatioStenosis       Waveform Comments +----------+--------+-----+---------------+---------+--------+ CFA Prox  89                          biphasic          +----------+--------+-----+---------------+---------+--------+ CFA Distal100                         biphasic          +----------+--------+-----+---------------+---------+--------+ DFA       67                          triphasic         +----------+--------+-----+---------------+---------+--------+ SFA Prox  100                         biphasic          +----------+--------+-----+---------------+---------+--------+ SFA Mid   120                         biphasic          +----------+--------+-----+---------------+---------+--------+ SFA Distal250     2.0  30-49% stenosisbiphasic          +----------+--------+-----+---------------+---------+--------+ POP Prox  41                          biphasic          +----------+--------+-----+---------------+---------+--------+ POP Distal44                          biphasic          +----------+--------+-----+---------------+---------+--------+ TP Trunk  60                          biphasic          +----------+--------+-----+---------------+---------+--------+ ATA Prox  47  biphasic          +----------+--------+-----+---------------+---------+--------+ PTA Mid   19                          biphasic          +----------+--------+-----+---------------+---------+--------+ PERO Mid  56                          biphasic           +----------+--------+-----+---------------+---------+--------+ A focal velocity elevation of 250 cm/s was obtained at Distal SFA with a VR of 2.0. Findings are characteristic of 30-49% stenosis.  +----------+--------+-----+---------------+-----------------+--------+ LEFT      PSV cm/sRatioStenosis       Waveform         Comments +----------+--------+-----+---------------+-----------------+--------+ CFA Prox  108                         triphasic                 +----------+--------+-----+---------------+-----------------+--------+ DFA       106                         triphasic                 +----------+--------+-----+---------------+-----------------+--------+ SFA Prox  84                          triphasic                 +----------+--------+-----+---------------+-----------------+--------+ SFA Mid   377     7.5  75-99% stenosisstenotic                  +----------+--------+-----+---------------+-----------------+--------+ SFA Distal60                          blunted/triphasic         +----------+--------+-----+---------------+-----------------+--------+ POP Prox  19                          blunted/biphasic          +----------+--------+-----+---------------+-----------------+--------+ POP Distal33                          monophasic                +----------+--------+-----+---------------+-----------------+--------+ TP Trunk  41                          monophasic                +----------+--------+-----+---------------+-----------------+--------+ ATA Prox  21                          monophasic                +----------+--------+-----+---------------+-----------------+--------+ PTA Mid   26                          monophasic                +----------+--------+-----+---------------+-----------------+--------+ PERO Mid  16  monophasic                 +----------+--------+-----+---------------+-----------------+--------+ A focal velocity elevation of 377 cm/s was obtained at mid/distal SFA with post stenotic turbulence with a VR of 7.5. Findings are characteristic of 75-99% stenosis.  Summary: Right: 30-49% stenosis noted in the superficial femoral artery. Atherosclerosis in the common femoral, femoral, and popliteal and tibial arteries. Three vessel run off. Left: 75-99% stenosis noted in the superficial femoral artery. Atherosclerosis in the common femoral, femoral, and popliteal and tibial arteries. Three vessel run off. Appointment with Dr. Fletcher Anon on 07/26/20.  See table(s) above for measurements and observations. Vascular consult recommended. Electronically signed by Larae Grooms MD on 07/21/2020 at 9:03:03 PM.    Final    Disposition   Patient is being discharged home today in good condition.  Follow-up Plans & Appointments     Follow-up Information    Lorretta Harp, MD Follow up.   Specialties: Cardiology, Radiology Why: Follow-up with Dr. Gwenlyn Found scheduled for 08/24/2020 at 1:30pm. Please arrive 15 minutes early for check-in. Contact information: [address] [place] [address] [phone]        [facility]. Go to.   Specialty: Cardiology Why: Repeat lower extremity ultrasounds scheduled for 08/05/2020 at 1:00pm at our [facility] (Dr. Kennon Holter office). Please arrive 15 minutes early for check-in. Contact information: [address] [place] [phone]             Discharge Instructions    Diet - low sodium heart healthy   Complete by: As directed    Increase activity slowly   Complete by: As directed       Discharge Medications   Allergies as of 08/02/2020      Reactions   Ciprofloxacin Rash   Lisinopril Swelling   Pneumococcal Vaccine Swelling   Pravastatin Other (See Comments)   Muscle aches   Amlodipine    swelling       Medication List    STOP taking these medications   omeprazole 40 MG capsule Commonly known as: PRILOSEC     TAKE these medications   aspirin EC 81 MG tablet Take 1 tablet (81 mg total) by mouth daily.   clopidogrel 75 MG tablet Commonly known as: PLAVIX Take 1 tablet (75 mg total) by mouth daily with breakfast.   ezetimibe 10 MG tablet Commonly known as: ZETIA Take 1 tablet (10 mg total) by mouth daily.   fluticasone 50 MCG/ACT nasal spray Commonly known as: FLONASE Place 1 spray into both nostrils daily as needed for allergies.   hydrochlorothiazide 25 MG tablet Commonly known as: HYDRODIURIL Take 1 tablet (25 mg total) by mouth daily.   HYDROcodone-acetaminophen 10-325 MG tablet Commonly known as: NORCO Take 1 tablet by mouth every 6 (six) hours as needed (Back pain).   LAXATIVE PO Take 2 tablets by mouth at bedtime as needed (Constipation). Tam   LORazepam 0.5 MG tablet Commonly known as: ATIVAN Take 0.5 mg by mouth daily as needed for anxiety.   losartan 100 MG tablet Commonly known as: COZAAR Take 1 tablet (100 mg total) by mouth daily.   pantoprazole 40 MG tablet Commonly known as: Protonix Take 1 tablet (40 mg total) by mouth daily.   sertraline 50 MG tablet Commonly known as: ZOLOFT Take 50 mg by mouth daily.   Sleep Aid 50 MG Caps Generic drug: diphenhydrAMINE HCl (Sleep) Take 50 mg by mouth at bedtime.  traMADol 50 MG tablet Commonly known as: ULTRAM Take 50 mg by mouth every 6 (six) hours as needed for moderate pain.   VITAMIN D PO Take 2,000 Units by mouth daily.          Outstanding Labs/Studies   N/A  Duration of Discharge Encounter   Greater than 30 minutes including physician time.  Signed, Darreld Mclean, PA-C 08/02/2020, 10:38 AM  Patient seen, examined. Available data reviewed. Agree with findings, assessment, and plan as outlined by Sande Rives, PA-C.  The patient is independently interviewed and examined.  Her  right groin site is clear with mild ecchymosis but no hematoma.  The left foot has an easily palpable posterior tibial pulse and the foot is warm.  The skin has no rash.  The patient is clinically stable after undergoing left SFA intervention yesterday.  She is appropriately on aspirin and clopidogrel.  Otherwise as outlined above.  Sherren Mocha, M.D. 08/02/2020 10:41 AM

## 2020-08-02 NOTE — Telephone Encounter (Signed)
Reviewed pt chart. Left VM.

## 2020-08-04 ENCOUNTER — Ambulatory Visit: Payer: PPO | Admitting: Nurse Practitioner

## 2020-08-05 ENCOUNTER — Other Ambulatory Visit: Payer: Self-pay

## 2020-08-05 ENCOUNTER — Ambulatory Visit (HOSPITAL_COMMUNITY)
Admission: RE | Admit: 2020-08-05 | Discharge: 2020-08-05 | Disposition: A | Discharge status: Home / Self Care | Payer: Medicare Other | Source: Ambulatory Visit | Attending: Cardiology | Admitting: Cardiology

## 2020-08-05 ENCOUNTER — Other Ambulatory Visit: Payer: Self-pay | Admitting: Cardiovascular Disease

## 2020-08-05 DIAGNOSIS — I739 Peripheral vascular disease, unspecified: Secondary | ICD-10-CM

## 2020-08-08 ENCOUNTER — Telehealth: Payer: Self-pay | Admitting: Cardiovascular Disease

## 2020-08-08 NOTE — Telephone Encounter (Signed)
New message:     Patient daughter calling stating that her mother had a stint put in 08/01/20 and where the wound is located there is some water type fluid coming out also has a little blood as well. Patient daughter think it is infected.

## 2020-08-08 NOTE — Telephone Encounter (Signed)
    Dawn calling, she said pt's groin is bleeding. She thinks it's because pt been coughing a lot because she have covid. She wanted to speak with a nurse to know what they need to stop the bleeding.

## 2020-08-08 NOTE — Telephone Encounter (Signed)
Returned call to daughter (ok per DPR)-she states she is concerned site from procedure may be infected.   She reports there is a small knot at the site (reports this was there at discharge), some pain at site (improving since discharge), mild redness around site, and some oozing at site.  Reports oozing clear, blood tinged fluid.    Reports patient currently has covid and is coughing a lot (see previous phone message from today).  She did have fever last night but unsure if this is related to covid.   Denies site being warm to touch, no bruising note.  Advised to monitor for increasing redness at site, warm to touch, discolored drainage, increased pain.  Advised when coughing to use hand to compress site.  Daughter verbalized understanding.   Advised would route to Dr. Gwenlyn Found for further review.

## 2020-08-08 NOTE — Telephone Encounter (Signed)
Spoke with Amanda [name] daughter, she reports the patient has covid and has been coughing a lot and now there is oozing from the puncture site from her recent procedure. She reported it messed up her pajamas this morning but now they have put a dressing on it and it is fine and will ooze just a little. Advised her to hold pressure to the site for several minutes and then to let us know if it were continue to bleed. Amanda [name] agreed with this plan.

## 2020-08-09 ENCOUNTER — Other Ambulatory Visit (HOSPITAL_COMMUNITY): Payer: Self-pay

## 2020-08-09 NOTE — Telephone Encounter (Signed)
Upon further review with Dr. Gwenlyn Found, he suggests that pt keep an eye on the site and report to urgent care if drainage were to change of it site becomes tender and/or warm to the touch.    Spoke with pt's daughter (ok per DPR), she reports that there is less drainage today and characteristics of drainage is still clear and has decreased. Advised daughter and pt to monitor the site.  If drainage changes colors or becomes puss-like or if site become tender and/or warm to the touch pt is to report to local urgent care to be treated will possible antibiotics. Instructed daughter that it is ok to cover the site with gauze and tape to prevent drainage from getting on clothing.  Daughter verbalizes understanding.

## 2020-08-11 ENCOUNTER — Ambulatory Visit: Payer: Medicare Other

## 2020-08-11 ENCOUNTER — Ambulatory Visit: Payer: Medicare Other | Admitting: Family Medicine

## 2020-08-12 ENCOUNTER — Other Ambulatory Visit (HOSPITAL_COMMUNITY): Payer: Self-pay

## 2020-08-12 ENCOUNTER — Telehealth (HOSPITAL_COMMUNITY): Payer: Self-pay

## 2020-08-12 NOTE — Telephone Encounter (Signed)
Transitions of Care Pharmacy   Call attempted for a pharmacy transitions of care follow-up. Patient picked up but has COVID so didn't feel like talking. Told her to give [place] a call when she was feeling better. Her meds have been transferred from [facility] to [facility]. She has follow ups scheduled for next month.

## 2020-08-23 ENCOUNTER — Encounter: Payer: Self-pay | Admitting: Nurse Practitioner

## 2020-08-23 ENCOUNTER — Ambulatory Visit: Payer: Medicare Other | Admitting: Nurse Practitioner

## 2020-08-23 ENCOUNTER — Other Ambulatory Visit: Payer: Self-pay

## 2020-08-23 VITALS — BP 124/78 | Ht 65.0 in | Wt 157.0 lb

## 2020-08-23 DIAGNOSIS — Z9189 Other specified personal risk factors, not elsewhere classified: Secondary | ICD-10-CM

## 2020-08-23 DIAGNOSIS — Z01419 Encounter for gynecological examination (general) (routine) without abnormal findings: Secondary | ICD-10-CM

## 2020-08-23 DIAGNOSIS — Z780 Asymptomatic menopausal state: Secondary | ICD-10-CM

## 2020-08-23 DIAGNOSIS — M8589 Other specified disorders of bone density and structure, multiple sites: Secondary | ICD-10-CM

## 2020-08-23 DIAGNOSIS — Z90710 Acquired absence of both cervix and uterus: Secondary | ICD-10-CM

## 2020-08-23 LAB — HM PAP SMEAR

## 2020-08-23 NOTE — Progress Notes (Signed)
   Amanda [name] Aug 19, 1945 [id number]   History:  75 y.o. [id number] presents for breast and pelvic exam. Postmenopausal - no HRT. 1993 TVH for fibroids with subsequent BSO in 2012. Normal pap and mammogram history. History of HTN, osteopenia, melanoma, PVD (stent placed 08/01/2020). Saw urology last year with discussion of bladder surgery but husband passed last September and she has not followed up. She still complains of urinary leakage.   Gynecologic History No LMP recorded. Patient has had a hysterectomy.   Contraception: status post hysterectomy  Health Maintenance Last Pap: No longer screening per guidelines Last mammogram: 01/17/2018. Results were: normal Last colonoscopy: 2017. Results were: normal Last Dexa: 2017. Results were: T-score -1.7, FRAX 10% / 1.4%  Past medical history, past surgical history, family history and social history were all reviewed and documented in the EPIC chart.  ROS:  A ROS was performed and pertinent positives and negatives are included.  Exam:  Vitals:   08/23/20 1328  BP: 124/78  Weight: 157 lb (71.2 kg)  Height: 5\' 5"  (1.651 m)   Body mass index is 26.13 kg/m.  General appearance:  Normal Thyroid:  Symmetrical, normal in size, without palpable masses or nodularity. Respiratory  Auscultation:  Clear without wheezing or rhonchi Cardiovascular  Auscultation:  Regular rate, without rubs, murmurs or gallops  Edema/varicosities:  Not grossly evident Abdominal  Soft,nontender, without masses, guarding or rebound.  Liver/spleen:  No organomegaly noted  Hernia:  None appreciated  Skin  Inspection:  Grossly normal Breasts: Examined lying and sitting.   Right: Without masses, retractions, nipple discharge or axillary adenopathy.   Left: Without masses, retractions, nipple discharge or axillary adenopathy. Genitourinary   Inguinal/mons:  Normal without inguinal adenopathy  External genitalia:  Normal appearing vulva with no masses,  tenderness, or lesions  BUS/Urethra/Skene's glands:  Normal  Vagina:  Normal appearing with normal color and discharge, no lesions. Atrophic changes  Cervix:  Absent  Uterus:  Absent  Adnexa/parametria:     Rt: Normal in size, without masses or tenderness.   Lt: Normal in size, without masses or tenderness.  Anus and perineum: Non-bleeding hemorrhoids  Digital rectal exam: Normal sphincter tone without palpated masses or tenderness  Assessment/Plan:  75 y.o. [id number] for breast and pelvic exam.   Well female exam with routine gynecological exam - Education provided on SBEs, importance of preventative screenings, current guidelines, high calcium diet, regular exercise, and multivitamin daily. Labs with PCP.   Osteopenia of multiple sites - Plan: DG Bone Density. Dexa 2017 T-score -1.7, FRAX 10% / 1.4%. Recommend repeating DXA. Continue vitamin D supplement and regular exercise.   History of total vaginal hysterectomy (TVH) - 1993 for fibroids, susequent BSO in 2012  Postmenopausal - Plan: DG Bone Density. No HRT.   Screening for cervical cancer - Normal Pap history. No longer screening per guidelines.   Screening for breast cancer - Normal mammogram history. Overdue but scheduled 09/01/2020. Normal breast exam today.  Screening for colon cancer - 2017 colonoscopy with 5-year recall. Recommend scheduling this soon.   Return in 1 year for annual.    Tamela Gammon DNP, 1:48 PM 08/23/2020

## 2020-08-23 NOTE — Patient Instructions (Signed)
Health Maintenance After Age 73 After age 73, you are at a higher risk for certain long-term diseases and infections as well as injuries from falls. Falls are a major cause of broken bones and head injuries in people who are older than age 73. Getting regular preventive care can help to keep you healthy and well. Preventive care includes getting regular testing and making lifestyle changes as recommended by your health care provider. Talk with your health care provider about:  Which screenings and tests you should have. A screening is a test that checks for a disease when you have no symptoms.  A diet and exercise plan that is right for you. What should I know about screenings and tests to prevent falls? Screening and testing are the best ways to find a health problem early. Early diagnosis and treatment give you the best chance of managing medical conditions that are common after age 73. Certain conditions and lifestyle choices may make you more likely to have a fall. Your health care provider may recommend:  Regular vision checks. Poor vision and conditions such as cataracts can make you more likely to have a fall. If you wear glasses, make sure to get your prescription updated if your vision changes.  Medicine review. Work with your health care provider to regularly review all of the medicines you are taking, including over-the-counter medicines. Ask your health care provider about any side effects that may make you more likely to have a fall. Tell your health care provider if any medicines that you take make you feel dizzy or sleepy.  Osteoporosis screening. Osteoporosis is a condition that causes the bones to get weaker. This can make the bones weak and cause them to break more easily.  Blood pressure screening. Blood pressure changes and medicines to control blood pressure can make you feel dizzy.  Strength and balance checks. Your health care provider may recommend certain tests to check your  strength and balance while standing, walking, or changing positions.  Foot health exam. Foot pain and numbness, as well as not wearing proper footwear, can make you more likely to have a fall.  Depression screening. You may be more likely to have a fall if you have a fear of falling, feel emotionally low, or feel unable to do activities that you used to do.  Alcohol use screening. Using too much alcohol can affect your balance and may make you more likely to have a fall. What actions can I take to lower my risk of falls? General instructions  Talk with your health care provider about your risks for falling. Tell your health care provider if: ? You fall. Be sure to tell your health care provider about all falls, even ones that seem minor. ? You feel dizzy, sleepy, or off-balance.  Take over-the-counter and prescription medicines only as told by your health care provider. These include any supplements.  Eat a healthy diet and maintain a healthy weight. A healthy diet includes low-fat dairy products, low-fat (lean) meats, and fiber from whole grains, beans, and lots of fruits and vegetables. Home safety  Remove any tripping hazards, such as rugs, cords, and clutter.  Install safety equipment such as grab bars in bathrooms and safety rails on stairs.  Keep rooms and walkways well-lit. Activity  Follow a regular exercise program to stay fit. This will help you maintain your balance. Ask your health care provider what types of exercise are appropriate for you.  If you need a cane or walker,   use it as recommended by your health care provider.  Wear supportive shoes that have nonskid soles.   Lifestyle  Do not drink alcohol if your health care provider tells you not to drink.  If you drink alcohol, limit how much you have: ? 0-1 drink a day for women. ? 0-2 drinks a day for men.  Be aware of how much alcohol is in your drink. In the [place] one drink equals one typical bottle of beer (12  oz), one-half glass of wine (5 oz), or one shot of hard liquor (1 oz).  Do not use any products that contain nicotine or tobacco, such as cigarettes and e-cigarettes. If you need help quitting, ask your health care provider. Summary  Having a healthy lifestyle and getting preventive care can help to protect your health and wellness after age 73.  Screening and testing are the best way to find a health problem early and help you avoid having a fall. Early diagnosis and treatment give you the best chance for managing medical conditions that are more common for people who are older than age 73.  Falls are a major cause of broken bones and head injuries in people who are older than age 73. Take precautions to prevent a fall at home.  Work with your health care provider to learn what changes you can make to improve your health and wellness and to prevent falls. This information is not intended to replace advice given to you by your health care provider. Make sure you discuss any questions you have with your health care provider. Document Revised: 06/26/2018 Document Reviewed: 01/16/2017 Elsevier Patient Education  2021 Elsevier Inc.  

## 2020-08-24 ENCOUNTER — Encounter: Payer: Self-pay | Admitting: Cardiovascular Disease

## 2020-08-24 ENCOUNTER — Ambulatory Visit: Payer: Medicare Other | Admitting: Cardiovascular Disease

## 2020-08-24 VITALS — BP 134/68 | HR 64 | Ht 65.0 in | Wt 158.0 lb

## 2020-08-24 DIAGNOSIS — Z789 Other specified health status: Secondary | ICD-10-CM | POA: Diagnosis not present

## 2020-08-24 DIAGNOSIS — E7800 Pure hypercholesterolemia, unspecified: Secondary | ICD-10-CM | POA: Diagnosis not present

## 2020-08-24 DIAGNOSIS — I739 Peripheral vascular disease, unspecified: Secondary | ICD-10-CM

## 2020-08-24 NOTE — Progress Notes (Signed)
08/24/2020 [name]   10-21-1945  [id number]  Primary Physician [name], [name], NP Primary Cardiologist: Lorretta Harp MD Lupe Carney, [place]  HPI:  Amanda [name] is a 75 y.o.   recently widowed (husband of 8 years died a month ago) Caucasian female mother of 2 living children (1 child deceased, died of an accident), grandmother of 75 grandchildren, great grandmother of 71 great-grandchildren is retired from working in a Special educational needs teacher.  She was referred for lifestyle limiting claudication.  Her primary care provider is Dr. Sherene Sires and cardiologist Dr. Johney Frame.  I last saw her in the office 07/26/2020 She has a history of CAD status post cath in 2015 revealing a 70% nondominant RCA with a recent CTA that showed less than 50% proximal LAD stenosis.  She has treated hypertension and hyperlipidemia.  She has a strong family history of heart disease with both parents and 2 siblings who had CAD.  She is never had a heart attack or stroke.  She denies chest pain or shortness of breath.  She does complain of left calf lifestyle limiting claudication with recent Dopplers performed 07/21/2020 suggesting a high-grade mid left SFA stenosis.  I performed peripheral angiography on her 08/01/2020 revealing high-grade segmental mid left SFA stenosis with two-vessel runoff.  She did have 50% mid right SFA stenosis.  I performed directional atherectomy followed by drug-coated balloon angioplasty with excellent result.  Her follow-up Dopplers performed 08/05/2020 normalized and her claudication has resolved.  Unfortunately she did contract COVID 2 days after her procedure which she has since recovered from.   Current Meds  Medication Sig  . aspirin EC 81 MG tablet Take 1 tablet (81 mg total) by mouth daily.  . Bisacodyl (LAXATIVE PO) Take 2 tablets by mouth at bedtime as needed (Constipation). Tam  . Cholecalciferol (VITAMIN D PO) Take 2,000 Units by mouth daily.  . clopidogrel  (PLAVIX) 75 MG tablet Take 1 tablet (75 mg total) by mouth daily with breakfast.  . diphenhydrAMINE HCl, Sleep, (SLEEP AID) 50 MG CAPS Take 50 mg by mouth at bedtime.  Marland Kitchen ezetimibe (ZETIA) 10 MG tablet Take 1 tablet (10 mg total) by mouth daily.  . fluticasone (FLONASE) 50 MCG/ACT nasal spray Place 1 spray into both nostrils daily as needed for allergies.  . hydrochlorothiazide (HYDRODIURIL) 25 MG tablet Take 1 tablet (25 mg total) by mouth daily.  Marland Kitchen HYDROcodone-acetaminophen (NORCO) 10-325 MG tablet Take 1 tablet by mouth every 6 (six) hours as needed (Back pain).  . LORazepam (ATIVAN) 0.5 MG tablet Take 0.5 mg by mouth daily as needed for anxiety.  Marland Kitchen losartan (COZAAR) 100 MG tablet Take 1 tablet (100 mg total) by mouth daily.  . pantoprazole (PROTONIX) 40 MG tablet Take 1 tablet (40 mg total) by mouth daily.  . sertraline (ZOLOFT) 50 MG tablet Take 50 mg by mouth daily.  . traMADol (ULTRAM) 50 MG tablet Take 50 mg by mouth every 6 (six) hours as needed for moderate pain.   Current Facility-Administered Medications for the 08/24/20 encounter (Office Visit) with Lorretta Harp, MD  Medication  . sodium chloride flush (NS) 0.9 % injection 3 mL     Allergies  Allergen Reactions  . Ciprofloxacin Rash  . Lisinopril Swelling  . Pneumococcal Vaccine Swelling  . Pravastatin Other (See Comments)    Muscle aches   . Amlodipine     swelling  . Zetia [Ezetimibe]     "felt horrible"  Social History   Socioeconomic History  . Marital status: Married    Spouse name: Not on file  . Number of children: 3  . Years of education: Not on file  . Highest education level: Not on file  Occupational History  . Occupation: Disabled  Tobacco Use  . Smoking status: Former Smoker    Quit date: 03/19/1994    Years since quitting: 26.4  . Smokeless tobacco: Never Used  Vaping Use  . Vaping Use: Never used  Substance and Sexual Activity  . Alcohol use: No    Alcohol/week: 0.0 standard drinks  .  Drug use: No  . Sexual activity: Not Currently    Birth control/protection: Surgical    Comment: 1st intercourse 75 yo-Fewer than 5 partners,des neg  Other Topics Concern  . Not on file  Social History Narrative  . Not on file   Social Determinants of Health   Financial Resource Strain: Not on file  Food Insecurity: Not on file  Transportation Needs: Not on file  Physical Activity: Not on file  Stress: Not on file  Social Connections: Not on file  Intimate Partner Violence: Not on file     Review of Systems: General: negative for chills, fever, night sweats or weight changes.  Cardiovascular: negative for chest pain, dyspnea on exertion, edema, orthopnea, palpitations, paroxysmal nocturnal dyspnea or shortness of breath Dermatological: negative for rash Respiratory: negative for cough or wheezing Urologic: negative for hematuria Abdominal: negative for nausea, vomiting, diarrhea, bright red blood per rectum, melena, or hematemesis Neurologic: negative for visual changes, syncope, or dizziness All other systems reviewed and are otherwise negative except as noted above.    Blood pressure 134/68, pulse 64, height 5\' 5"  (1.651 m), weight 158 lb (71.7 kg).  General appearance: alert and no distress Neck: no adenopathy, no carotid bruit, no JVD, supple, symmetrical, trachea midline and thyroid not enlarged, symmetric, no tenderness/mass/nodules Lungs: clear to auscultation bilaterally Heart: regular rate and rhythm, S1, S2 normal, no murmur, click, rub or gallop Extremities: extremities normal, atraumatic, no cyanosis or edema Pulses: 2+ and symmetric Skin: Skin color, texture, turgor normal. No rashes or lesions Neurologic: Alert and oriented X 3, normal strength and tone. Normal symmetric reflexes. Normal coordination and gait  EKG not performed today  ASSESSMENT AND PLAN:   PVD (peripheral vascular disease) ([place]) History of PAD status post left SFA directional atherectomy  followed by drug-coated balloon angioplasty of high-grade mid left SFA stenosis reducing a 95% stenosis to 0% residual.  She did did have two-vessel runoff.  Her follow-up Dopplers have normalized and her claudication has resolved.  We will recheck lower extremity arterial Doppler studies in 6 months.  Statin intolerance History of hyperlipidemia intolerant to statins and Zetia.  Most recent lipid profile performed 07/06/2020 revealed a total cholesterol 271, LDL of 188 and HDL of 43.  We will start her on Repatha.      Lorretta Harp MD FACP,FACC,FAHA, [place] 08/24/2020 1:34 PM

## 2020-08-24 NOTE — Patient Instructions (Addendum)
Medication Instructions:  Dr. Gwenlyn Found recommends Repatha 140mg /mL or Praluent 150mg /mL (PCSK9). This is an injectable cholesterol medication self-administered once every 14 days. This medication will likely need prior approval with your insurance company, which we will work on. If the medication is not approved initially, we may need to do an appeal with your insurance.   Administer medication in area of fatty tissue such as abdomen, outer thigh, back of upper arm - and rotate site with each injection Store medication in refrigerator until ready to administer - allow to sit at room temp for 30 mins - 1 hour prior to injection Dispose of medication in a SHARPS container - your pharmacy should be able to direct you on this and proper disposal     Patient Assistance:  The [employer] foundation offers assistance to help pay for medication copays.  They will cover copays for all cholesterol lowering meds, including statins, fibrates, omega-3 fish oils like Vascepa, ezetimibe, Repatha, Praluent, Nexletol, Nexlizet.  The cards are usually good for $2,500 or 12 months, whichever comes first. 1. Go to healthwellfoundation.org 2. Click on "Apply Now" 3. Answer questions as to whom is applying (patient or representative) 4. Your disease fund will be "hypercholesterolemia - Medicare access" 5. They will ask questions about finances and which medications you are taking for cholesterol 6. When you submit, the approval is usually within minutes.  You will need to print the card information from the site 7. You will need to show this information to your pharmacy, they will bill your Medicare Part D plan first -then bill [employer] --for the copay.   You can also call them at [phone], although the hold times can be quite long.   *If you need a refill on your cardiac medications before your next appointment, please call your pharmacy*   Lab Work: FASTING lab work in 2-3 months  If you have labs (blood  work) drawn today and your tests are completely normal, you will receive your results only by: Marland Kitchen MyChart Message (if you have MyChart) OR . A paper copy in the mail If you have any lab test that is abnormal or we need to change your treatment, we will call you to review the results.   Testing/Procedures: Lower Extremity Arterial Dopplers in 6 months   Follow-Up: At [facility], you and your health needs are our priority.  As part of our continuing mission to provide you with exceptional heart care, we have created designated Provider Care Teams.  These Care Teams include your primary Cardiologist (physician) and Advanced Practice Providers (APPs -  Physician Assistants and Nurse Practitioners) who all work together to provide you with the care you need, when you need it.  We recommend signing up for the patient portal called "MyChart".  Sign up information is provided on this After Visit Summary.  MyChart is used to connect with patients for Virtual Visits (Telemedicine).  Patients are able to view lab/test results, encounter notes, upcoming appointments, etc.  Non-urgent messages can be sent to your provider as well.   To learn more about what you can do with MyChart, go to [email or web address].    Your next appointment:   6 month(s)  The format for your next appointment:   In Person  Provider:   You may see Dr. Gwenlyn Found or one of the following Advanced Practice Providers on your designated Care Team:    [place], PA-C  Coletta Memos, FNP    Other Instructions  The clinical  pharmacy team will contact you about approval for your new cholesterol medication and follow up.

## 2020-08-24 NOTE — Addendum Note (Signed)
Addended by: Fidel Levy on: 08/24/2020 02:22 PM   Modules accepted: Orders

## 2020-08-24 NOTE — Assessment & Plan Note (Signed)
History of PAD status post left SFA directional atherectomy followed by drug-coated balloon angioplasty of high-grade mid left SFA stenosis reducing a 95% stenosis to 0% residual.  She did did have two-vessel runoff.  Her follow-up Dopplers have normalized and her claudication has resolved.  We will recheck lower extremity arterial Doppler studies in 6 months.

## 2020-08-24 NOTE — Assessment & Plan Note (Signed)
History of hyperlipidemia intolerant to statins and Zetia.  Most recent lipid profile performed 07/06/2020 revealed a total cholesterol 271, LDL of 188 and HDL of 43.  We will start her on Repatha.

## 2020-08-25 ENCOUNTER — Ambulatory Visit: Payer: Medicare Other

## 2020-08-25 ENCOUNTER — Ambulatory Visit (INDEPENDENT_AMBULATORY_CARE_PROVIDER_SITE_OTHER): Payer: Medicare Other | Admitting: Pharmacist Clinician (PhC)/ Clinical Pharmacy Specialist

## 2020-08-25 ENCOUNTER — Other Ambulatory Visit: Payer: Self-pay

## 2020-08-25 VITALS — BP 132/64 | HR 67 | Resp 16 | Ht 65.0 in | Wt 158.0 lb

## 2020-08-25 DIAGNOSIS — E782 Mixed hyperlipidemia: Secondary | ICD-10-CM

## 2020-08-25 DIAGNOSIS — G720 Drug-induced myopathy: Secondary | ICD-10-CM

## 2020-08-25 DIAGNOSIS — T466X5A Adverse effect of antihyperlipidemic and antiarteriosclerotic drugs, initial encounter: Secondary | ICD-10-CM | POA: Diagnosis not present

## 2020-08-25 NOTE — Patient Instructions (Addendum)
Your Results:             Your most recent labs Goal  Total Cholesterol 271 < 200  Triglycerides 208 < 150  HDL (happy/good cholesterol) 43 > 40  LDL (lousy/bad cholesterol 188 < 70     Medication changes:  We will start the paperwork to get Repatha Pushtronix approved by your insurance company.  [place] will let you know once it is approved.  Lab orders:  We will need to repeat labs after 2 or 3 infusions.    Patient Assistance:  The [employer] foundation offers assistance to help pay for medication copays.  They will cover copays for all cholesterol lowering meds, including statins, fibrates, omega-3 oils, ezetimibe, Repatha, Praluent, Nexletol, Nexlizet.  The cards are usually good for $2,500 or 12 months, whichever comes first. Go to healthwellfoundation.org Click on "Apply Now" Answer questions as to whom is applying (patient or representative) Your disease fund will be "hypercholesterolemia - Medicare access" They will ask questions about finances and which medications you are taking for cholesterol When you submit, the approval is usually within minutes.  You will need to print the card information from the site You will need to show this information to your pharmacy, they will bill your Medicare Part D plan first -then bill [employer] Well --for the copay.   You can also call them at [phone], although the hold times can be quite long.   Thank you for choosing [facility]

## 2020-08-25 NOTE — Assessment & Plan Note (Addendum)
Patient with hyperlipidemia and ASCVD, unable to tolerate multiple statin drugs.   Reviewed options for lowering LDL cholesterol, including PCSK-9 inhibitors, bempedoic acid and inclisiran.  Discussed mechanisms of action, dosing, side effects and potential decreases in LDL cholesterol.  Answered all patient questions.  Based on this information, patient would prefer to start PCSK-9 inhibitor.  Specifically, she would prefer to use the Repatha Pushtronix system.  Her daughter is a Charity fundraiser, so she is sure that they will be able to use without difficulty.  Advised she can call with any questions or concerns.

## 2020-08-25 NOTE — Progress Notes (Signed)
08/25/2020 RMANI KAPUSTA October 01, 1945 [id number]   HPI:  Amanda [name] is a 75 y.o. female patient of Dr Gwenlyn Found, who presents today for a lipid clinic evaluation.  See pertinent past medical history below.   She was seen by Dr. Gwenlyn Found this year for lifestyle limiting claudication in her left calf.  Peripheral angiogram showed high grade segmental mid left SFA stenosis.  Balloon angioplasty was done with resolution of claudication.  She is in the office today to discuss options for cholesterol lowering.  She has tried multiple statin drugs, all causing myalgias.    Past Medical History hypertension Today 132/64 - on losartan 100 mg, hctz 25 mg  CAD 2015 cath showed 70% nondominant RCA with calcium score 03/2018 of 53 (64th  percentile for age/sex)  PAD Angioplasty to left SFA  Vitamin D deficiency Most recently at 31, on 2,000 IU daily    Current Medications: ezetimibe 10 mg qd  Cholesterol Goals: LDL < 70   Intolerant/previously tried: rosuvastatin, atorvastatin, pravastatin - all caused myalgias.    Family history: mother with heart diseae, died from MI at 35; father died at 21 old age; 87 brothers, 38 sisters, 2 of each still living; 1 sister died from stroke, more cancers than heart disease 2 daughters living, both with high cholesterol; PAD in one sibling and one daughter  Diet: mostly home cooked meals, some pan fried food; not too many vegetables; husband died in 12/15/2022, not much appetite since; daughter now lives with her so she's trying to eat more balanced  Exercise:  none recently, waiting for leg to heal  Labs: 07/14/20:  TC 271, TG 208, HDL 43, LDL 188 - no medications    Current Outpatient Medications  Medication Sig Dispense Refill   aspirin EC 81 MG tablet Take 1 tablet (81 mg total) by mouth daily. 90 tablet 3   Bisacodyl (LAXATIVE PO) Take 2 tablets by mouth at bedtime as needed (Constipation). Tam     Cholecalciferol (VITAMIN D PO) Take 2,000 Units by mouth daily.      clopidogrel (PLAVIX) 75 MG tablet Take 1 tablet (75 mg total) by mouth daily with breakfast. 90 tablet 3   diphenhydrAMINE HCl, Sleep, (SLEEP AID) 50 MG CAPS Take 50 mg by mouth at bedtime.     ezetimibe (ZETIA) 10 MG tablet Take 1 tablet (10 mg total) by mouth daily. 90 tablet 1   fluticasone (FLONASE) 50 MCG/ACT nasal spray Place 1 spray into both nostrils daily as needed for allergies.     hydrochlorothiazide (HYDRODIURIL) 25 MG tablet Take 1 tablet (25 mg total) by mouth daily. 90 tablet 3   HYDROcodone-acetaminophen (NORCO) 10-325 MG tablet Take 1 tablet by mouth every 6 (six) hours as needed (Back pain).     LORazepam (ATIVAN) 0.5 MG tablet Take 0.5 mg by mouth daily as needed for anxiety.     losartan (COZAAR) 100 MG tablet Take 1 tablet (100 mg total) by mouth daily. 90 tablet 3   pantoprazole (PROTONIX) 40 MG tablet Take 1 tablet (40 mg total) by mouth daily. 60 tablet 2   sertraline (ZOLOFT) 50 MG tablet Take 50 mg by mouth daily.     traMADol (ULTRAM) 50 MG tablet Take 50 mg by mouth every 6 (six) hours as needed for moderate pain.  0   Current Facility-Administered Medications  Medication Dose Route Frequency Provider Last Rate Last Admin   sodium chloride flush (NS) 0.9 % injection 3 mL  3 mL Intravenous Q12H  Lorretta Harp, MD        Allergies  Allergen Reactions   Ciprofloxacin Rash   Lisinopril Swelling   Pneumococcal Vaccine Swelling   Pravastatin Other (See Comments)    Muscle aches    Amlodipine     swelling   Crestor [Rosuvastatin]     myalgias   Lipitor [Atorvastatin]     myalgias   Zetia [Ezetimibe]     "felt horrible"    Past Medical History:  Diagnosis Date   Arthritis    Back pain    CAD (coronary artery disease)    a. 01/2014: cath showing 70% stenosis of non-dominant RCA --> medically managed.    Cataract    bilat removed   Diverticulosis    GERD (gastroesophageal reflux disease)    Hemorrhoids    Hiatal hernia    Hyperlipidemia     Hypertension    Melanoma ([place])    Orbital fracture ([place])    right   Osteopenia 07/2015   T score -2.0 FRAX 10%/1.4%   Pituitary tumor    Stricture of esophagus    Vitamin D deficiency     Blood pressure 132/64, pulse 67, resp. rate 16, height 5\' 5"  (1.651 m), weight 158 lb (71.7 kg), SpO2 96 %.   Hyperlipidemia Patient with hyperlipidemia and ASCVD, unable to tolerate multiple statin drugs.   Reviewed options for lowering LDL cholesterol, including PCSK-9 inhibitors, bempedoic acid and inclisiran.  Discussed mechanisms of action, dosing, side effects and potential decreases in LDL cholesterol.  Answered all patient questions.  Based on this information, patient would prefer to start PCSK-9 inhibitor.  Specifically, she would prefer to use the Repatha Pushtronix system.  Her daughter is a Charity fundraiser, so she is sure that they will be able to use without difficulty.  Advised she can call with any questions or concerns.    Tommy Medal PharmD CPP [facility] [address] [place], [place] [address] [phone]

## 2020-08-29 ENCOUNTER — Telehealth: Payer: Self-pay

## 2020-08-29 DIAGNOSIS — E782 Mixed hyperlipidemia: Secondary | ICD-10-CM

## 2020-08-29 MED ORDER — REPATHA PUSHTRONEX SYSTEM 420 MG/3.5ML ~~LOC~~ SOCT: 420.0000 mg | SUBCUTANEOUS | 11 refills | Status: DC

## 2020-08-29 NOTE — Telephone Encounter (Signed)
Called and spoke w/pt and stated that they needed to start repatha pushtronix and the pt voiced understanding to also complete fasting labs post 2nd dose and rx sent to pharmacy on file

## 2020-08-29 NOTE — Telephone Encounter (Signed)
-----   Message from Rockne Menghini, [place] sent at 08/25/2020  3:53 PM EDT ----- [place] Silos  She would like the Pushtronix if we can get that.    Thank you! Erasmo Downer

## 2020-09-01 ENCOUNTER — Ambulatory Visit
Admission: RE | Admit: 2020-09-01 | Discharge: 2020-09-01 | Disposition: A | Discharge status: Home / Self Care | Payer: Medicare Other | Source: Ambulatory Visit | Attending: Nurse Practitioner | Admitting: Nurse Practitioner

## 2020-09-01 ENCOUNTER — Other Ambulatory Visit: Payer: Self-pay

## 2020-09-01 DIAGNOSIS — Z1231 Encounter for screening mammogram for malignant neoplasm of breast: Secondary | ICD-10-CM

## 2020-10-13 DIAGNOSIS — H903 Sensorineural hearing loss, bilateral: Secondary | ICD-10-CM

## 2020-10-13 HISTORY — DX: Sensorineural hearing loss, bilateral: H90.3

## 2020-11-10 ENCOUNTER — Telehealth: Payer: Self-pay | Admitting: Cardiovascular Disease

## 2020-11-10 MED ORDER — LOSARTAN POTASSIUM 100 MG PO TABS: 100.0000 mg | ORAL_TABLET | Freq: Every day | ORAL | 3 refills | Status: DC

## 2020-11-10 NOTE — Telephone Encounter (Signed)
Spoke with the pt and reviewed recommendations per our Pharmacist Megan Supple in BP clinic.   Pt states she will now start taking her HCTZ in the AM and losartan in the PM, and continue to monitor her pressures and call us back if her readings are still low, so that we can decrease her HCTZ to 12.5 mg po daily at that time.   Pt verbalized understanding and agrees with this plan.

## 2020-11-10 NOTE — Telephone Encounter (Signed)
Pt is calling in to ask Dr. Johney Frame or our BP clinic, how she should proceed with taking her BP meds, if her readings are on the lower side prior to taking them.  Pt provided me with 3 days worth of readings, and only one day (today) she took her BP twice and did not administer any BP meds due to readings.  On the other 2 days, she took her BP when she woke up, and took it again one hour after med administration.  Pt only complains of headaches sometimes, but adds she has terrible sinus issues all year round.  Below are the recordings pt provided.   8/23: BP when waking- 138/73  BP one hr after med administration- 109/72  8/24:  BP when waking- 141/79  BP one hr after med administration- 107/74  8/25:  BP when waking at 0830-  132/72  BP at 10 am and did NOT administer meds- 105/71   Pt states she has not taken her BP meds at all today, for she is concerned being it is 105/71 at 10 am, this might be too low to administer her HCTZ and losartan.  Pt would like further direction on what to do with her meds, in this case.   Informed the pt that I will route this message to Dr. Radford Pax (covering for Dr. Johney Frame) and our Pharmacist in BP clinic to further review and advise on this message.  Informed the pt that I will follow-up with her accordingly thereafter. Pt verbalized understanding and agrees with this plan.

## 2020-11-10 NOTE — Telephone Encounter (Signed)
Would see what time pt takes her losartan and HCTZ. If she's taking them both in the AM, would recommend that she move her losartan to PM dosing and keep HCTZ in the AM. This should provide better 24 hour BP coverage (compared to taking both meds in the AM, when her BP increases as the meds are wearing off around the 24 hour mark and then both meds hit her system at the same time and drop her BP a bit low). If she still sees low readings with this change, would decrease HCTZ to 12.'5mg'$  daily.

## 2020-11-10 NOTE — Telephone Encounter (Signed)
Pt c/o BP issue: STAT if pt c/o blurred vision, one-sided weakness or slurred speech  1. What are your last 5 BP readings?  11/08/20: 138/73        109/72  11/09/20: 141/79        107/74 (after medication)  11/10/20:  132/72        105/71  2. Are you having any other symptoms (ex. Dizziness, headache, blurred vision, passed out)?  Headaches   3. What is your BP issue?   Patient states her BP has been fluctuating

## 2020-11-10 NOTE — Telephone Encounter (Signed)
Message  Will defer to [place] who has been adjusting her BP Meds  ----- Message -----  From: Nuala Alpha, LPN  Sent: [id number]  12:20 PM EDT  To: Sueanne Margarita, MD, Nuala Alpha, LPN, *

## 2020-11-29 DIAGNOSIS — N3946 Mixed incontinence: Secondary | ICD-10-CM

## 2020-11-29 HISTORY — DX: Mixed incontinence: N39.46

## 2020-11-30 ENCOUNTER — Telehealth: Payer: Self-pay | Admitting: Cardiovascular Disease

## 2020-11-30 NOTE — Telephone Encounter (Signed)
   [facility] Pre-operative Risk Assessment    Patient Name: Amanda [name]  DOB: [birth year] MRN: [id number]  HEARTCARE STAFF:  - IMPORTANT!!!!!! Under Visit Info/Reason for Call, type in Other and utilize the format Clearance MM/DD/YY or Clearance TBD. Do not use dashes or single digits. - Please review there is not already an duplicate clearance open for this procedure. - If request is for dental extraction, please clarify the # of teeth to be extracted. - If the patient is currently at the dentist's office, call Pre-Op Callback Staff (MA/nurse) to input urgent request.  - If the patient is not currently in the dentist office, please route to the Pre-Op pool.  Request for surgical clearance:  What type of surgery is being performed? midurethral sling  When is this surgery scheduled? 12/29/2020  What type of clearance is required (medical clearance vs. Pharmacy clearance to hold med vs. Both)? Pharmacy  Are there any medications that need to be held prior to surgery and how long? Plavix, 1 week prior  Practice name and name of physician performing surgery? [facility], Dr. Rosario Adie  What is the office phone number? [phone]   7.   What is the office fax number? [phone]  8.   Anesthesia type (None, local, MAC, general) ? local   Selena Zobro 11/30/2020, 4:32 PM  _________________________________________________________________   (provider comments below)

## 2020-12-02 NOTE — Telephone Encounter (Addendum)
   Patient Name: Amanda [name]  DOB: [birth year] MRN: [id number]  Primary Cardiologist: Freada Bergeron, MD  Chart reviewed as part of pre-operative protocol coverage.    Patient has history of CAD s/p catheterization in 2015 with 70%s nondominant RCA and recent CTA showing less than 50% proximal LAD stenosis, hypertension, hyperlipidemia, PAD, COVID-19 5/22.  She was seen in office 07/26/2020 and doing well without any chest pain or shortness of breath.  Most recent 2/22 echo with EF 60 to 65%, NR WMA.  She underwent 08/05/2020 left SFA intervention with recommendation for an uninterrupted 12 months with ASA and clopidogrel.  Dr. Gwenlyn Found has been contacted and confirms that, given it has been 4 months since her procedure, it is acceptable for her to hold Plavix for at least 1 week prior to her procedure with restart by the urology team once felt safe to do so from a bleeding standpoint..  Will forward to requesting party via epic fax function and removed from preop pool.  Arvil Chaco, PA-C 12/02/2020, 9:14 AM

## 2020-12-02 NOTE — Telephone Encounter (Signed)
    Patient Name: Amanda [name]  DOB: [birth year] MRN: [id number]   Primary Cardiologist: Freada Bergeron, MD   Chart reviewed as part of pre-operative protocol coverage.  Patient has history of CAD s/p catheterization in 2015 with 70%s nondominant RCA and recent CTA showing less than 50% proximal LAD stenosis, hypertension, hyperlipidemia, PAD, COVID-19 5/22.  She was seen in office 07/26/2020 and doing well without any chest pain or shortness of breath.  Most recent 2/22 echo with EF 60 to 65%, NR WMA.   She underwent 08/05/2020 left SFA intervention with recommendation for an uninterrupted 12 months with ASA and clopidogrel.  Dr. Gwenlyn Found has been contacted and confirms it is acceptable for her to hold Plavix for at least 1 week prior to her procedure with restart by the urology team once felt safe to do so from a bleeding standpoint.   Will forward to requesting party via epic fax function and removed from preop pool.   Arvil Chaco, PA-C

## 2020-12-15 LAB — HEPATIC FUNCTION PANEL
ALT: 8 IU/L (ref 0–32)
AST: 18 IU/L (ref 0–40)
Albumin: 4.3 g/dL (ref 3.7–4.7)
Alkaline Phosphatase: 81 IU/L (ref 44–121)
Bilirubin Total: 0.3 mg/dL (ref 0.0–1.2)
Bilirubin, Direct: 0.11 mg/dL (ref 0.00–0.40)
Total Protein: 6.2 g/dL (ref 6.0–8.5)

## 2020-12-15 LAB — LIPID PANEL
Chol/HDL Ratio: 3.5 ratio (ref 0.0–4.4)
Cholesterol, Total: 129 mg/dL (ref 100–199)
HDL: 37 mg/dL — ABNORMAL LOW (ref >39)
LDL Chol Calc (NIH): 67 mg/dL (ref 0–99)
Triglycerides: 145 mg/dL (ref 0–149)
VLDL Cholesterol Cal: 25 mg/dL (ref 5–40)

## 2020-12-17 HISTORY — PX: INCONTINENCE SURGERY: SHX676

## 2020-12-23 ENCOUNTER — Telehealth: Payer: Self-pay | Admitting: Cardiology

## 2020-12-23 MED ORDER — LOSARTAN POTASSIUM 100 MG PO TABS: 100.0000 mg | ORAL_TABLET | Freq: Every day | ORAL | 1 refills | Status: DC

## 2020-12-23 NOTE — Telephone Encounter (Signed)
Pt's medication was sent to pt's pharmacy as requested. Confirmation received.  °

## 2020-12-23 NOTE — Telephone Encounter (Signed)
*  STAT* If patient is at the pharmacy, call can be transferred to refill team.   1. Which medications need to be refilled? (please list name of each medication and dose if known) losartan (COZAAR) 100 MG tablet  2. Which pharmacy/location (including street and city if local pharmacy) is medication to be sent to? [place], [place]  3. Do they need a 30 day or 90 day supply? 90 day   Patient is out of medication.

## 2021-01-12 ENCOUNTER — Other Ambulatory Visit: Payer: Self-pay | Admitting: Interventional Cardiology

## 2021-02-14 DIAGNOSIS — E236 Other disorders of pituitary gland: Secondary | ICD-10-CM

## 2021-02-14 HISTORY — DX: Other disorders of pituitary gland: E23.6

## 2021-02-16 DIAGNOSIS — L570 Actinic keratosis: Secondary | ICD-10-CM | POA: Diagnosis not present

## 2021-02-16 DIAGNOSIS — Z85828 Personal history of other malignant neoplasm of skin: Secondary | ICD-10-CM | POA: Diagnosis not present

## 2021-02-16 DIAGNOSIS — L821 Other seborrheic keratosis: Secondary | ICD-10-CM | POA: Diagnosis not present

## 2021-02-21 ENCOUNTER — Other Ambulatory Visit: Payer: Self-pay

## 2021-02-21 ENCOUNTER — Ambulatory Visit (HOSPITAL_COMMUNITY)
Admission: RE | Admit: 2021-02-21 | Discharge: 2021-02-21 | Disposition: A | Discharge status: Home / Self Care | Payer: Medicare Other | Source: Ambulatory Visit | Attending: Internal Medicine | Admitting: Internal Medicine

## 2021-02-21 DIAGNOSIS — I739 Peripheral vascular disease, unspecified: Secondary | ICD-10-CM | POA: Diagnosis not present

## 2021-02-28 ENCOUNTER — Other Ambulatory Visit: Payer: Self-pay

## 2021-02-28 ENCOUNTER — Ambulatory Visit: Payer: Medicare Other | Admitting: Cardiovascular Disease

## 2021-02-28 ENCOUNTER — Encounter: Payer: Self-pay | Admitting: Cardiovascular Disease

## 2021-02-28 DIAGNOSIS — I739 Peripheral vascular disease, unspecified: Secondary | ICD-10-CM

## 2021-02-28 NOTE — Patient Instructions (Signed)
Medication Instructions:  Your physician recommends that you continue on your current medications as directed. Please refer to the Current Medication list given to you today.  *If you need a refill on your cardiac medications before your next appointment, please call your pharmacy*   Testing/Procedures: Your physician has requested that you have a lower extremity arterial duplex. This test is an ultrasound of the arteries in the legs. It looks at arterial blood flow in the legs. Allow one hour for Lower Arterial scans. There are no restrictions or special instructions.  Your physician has requested that you have an ankle brachial index (ABI). During this test an ultrasound and blood pressure cuff are used to evaluate the arteries that supply the arms and legs with blood. Allow thirty minutes for this exam. There are no restrictions or special instructions.  To be done in December 2023. These procedures are done at [place].   Follow-Up: At [facility], you and your health needs are our priority.  As part of our continuing mission to provide you with exceptional heart care, we have created designated Provider Care Teams.  These Care Teams include your primary Cardiologist (physician) and Advanced Practice Providers (APPs -  Physician Assistants and Nurse Practitioners) who all work together to provide you with the care you need, when you need it.  We recommend signing up for the patient portal called "MyChart".  Sign up information is provided on this After Visit Summary.  MyChart is used to connect with patients for Virtual Visits (Telemedicine).  Patients are able to view lab/test results, encounter notes, upcoming appointments, etc.  Non-urgent messages can be sent to your provider as well.   To learn more about what you can do with MyChart, go to [email or web address].    Your next appointment:   12 month(s)  The format for your next appointment:   In Person  Provider:    Quay Burow, MD

## 2021-02-28 NOTE — Progress Notes (Signed)
02/28/2021 Amanda [name]   [birth year]  [id number]  Primary Physician Amanda [name], [place] Primary Cardiologist: Amanda Harp MD Amanda [name], [place]  HPI:  Amanda [name] is a 75 y.o.  recently widowed (husband of 68 years died a month ago) Caucasian female mother of 2 living children (1 child deceased, died of an accident), grandmother of 51 grandchildren, great grandmother of 27 great-grandchildren is retired from working in a Special educational needs teacher.  She was referred for lifestyle limiting claudication.  Her primary care provider is Amanda [name] and cardiologist Amanda [name].  I last saw her in the office 08/24/2020 she has a history of CAD status post cath in 2015 revealing a 70% nondominant RCA with a recent CTA that showed less than 50% proximal LAD stenosis.  She has treated hypertension and hyperlipidemia.  She has a strong family history of heart disease with both parents and 2 siblings who had CAD.  She is never had a heart attack or stroke.  She denies chest pain or shortness of breath.  She does complain of left calf lifestyle limiting claudication with recent Dopplers performed 07/21/2020 suggesting a high-grade mid left SFA stenosis.   I performed peripheral angiography on her 08/01/2020 revealing high-grade segmental mid left SFA stenosis with two-vessel runoff.  She did have 50% mid right SFA stenosis.  I performed directional atherectomy followed by drug-coated balloon angioplasty with excellent result.  Her follow-up Dopplers performed 08/05/2020 normalized and her claudication has resolved.  Unfortunately she did contract COVID 2 days after her procedure which she has since recovered from.  Since I saw her 6 months ago she continues to do well.  She has no complaints of claudication.  Her Dopplers performed 02/21/2021 revealed normal ABIs bilaterally with a widely patent left SFA.   Current Meds  Medication Sig   Bisacodyl (LAXATIVE PO) Take 2 tablets by mouth at bedtime as  needed (Constipation). Tam   Cholecalciferol (VITAMIN D PO) Take 2,000 Units by mouth daily.   clopidogrel (PLAVIX) 75 MG tablet Take 1 tablet (75 mg total) by mouth daily with breakfast.   diphenhydrAMINE HCl, Sleep, (SLEEP AID) 50 MG CAPS Take 50 mg by mouth at bedtime.   Evolocumab with Infusor ([place]) 420 MG/3.5ML SOCT Inject 420 mg into the skin every 30 (thirty) days.   fluticasone (FLONASE) 50 MCG/ACT nasal spray Place 1 spray into both nostrils daily as needed for allergies.   hydrochlorothiazide (HYDRODIURIL) 25 MG tablet Take 1 tablet (25 mg total) by mouth daily.   HYDROcodone-acetaminophen (NORCO) 10-325 MG tablet Take 1 tablet by mouth every 6 (six) hours as needed (Back pain).   LORazepam (ATIVAN) 0.5 MG tablet Take 0.5 mg by mouth daily as needed for anxiety.   losartan (COZAAR) 100 MG tablet Take 1 tablet (100 mg total) by mouth at bedtime.   pantoprazole (PROTONIX) 40 MG tablet TAKE ONE TABLET BY MOUTH DAILY   sertraline (ZOLOFT) 50 MG tablet Take 50 mg by mouth daily.   Current Facility-Administered Medications for the 02/28/21 encounter (Office Visit) with Amanda Harp, MD  Medication   sodium chloride flush (NS) 0.9 % injection 3 mL     Allergies  Allergen Reactions   Ciprofloxacin Rash   Lisinopril Swelling   Pneumococcal Vaccine Swelling   Pravastatin Other (See Comments)    Muscle aches    Amlodipine     swelling   Crestor [Rosuvastatin]     myalgias   Lipitor [Atorvastatin]  myalgias   Zetia [Ezetimibe]     "felt horrible"    Social History   Socioeconomic History   Marital status: Married    Spouse name: Not on file   Number of children: 3   Years of education: Not on file   Highest education level: Not on file  Occupational History   Occupation: Disabled  Tobacco Use   Smoking status: Former    Types: Cigarettes    Quit date: 03/19/1994    Years since quitting: 26.9   Smokeless tobacco: Never  Vaping Use   Vaping  Use: Never used  Substance and Sexual Activity   Alcohol use: No    Alcohol/week: 0.0 standard drinks   Drug use: No   Sexual activity: Not Currently    Birth control/protection: Surgical    Comment: 1st intercourse 75 yo-Fewer than 5 partners,des neg  Other Topics Concern   Not on file  Social History Narrative   Not on file   Social Determinants of Health   Financial Resource Strain: Not on file  Food Insecurity: Not on file  Transportation Needs: Not on file  Physical Activity: Not on file  Stress: Not on file  Social Connections: Not on file  Intimate Partner Violence: Not on file     Review of Systems: General: negative for chills, fever, night sweats or weight changes.  Cardiovascular: negative for chest pain, dyspnea on exertion, edema, orthopnea, palpitations, paroxysmal nocturnal dyspnea or shortness of breath Dermatological: negative for rash Respiratory: negative for cough or wheezing Urologic: negative for hematuria Abdominal: negative for nausea, vomiting, diarrhea, bright red blood per rectum, melena, or hematemesis Neurologic: negative for visual changes, syncope, or dizziness All other systems reviewed and are otherwise negative except as noted above.    Blood pressure 120/76, pulse 79, height 5\' 5"  (1.651 m), weight 159 lb 3.2 oz (72.2 kg), SpO2 99 %.  General appearance: alert and no distress Neck: no adenopathy, no carotid bruit, no JVD, supple, symmetrical, trachea midline, and thyroid not enlarged, symmetric, no tenderness/mass/nodules Lungs: clear to auscultation bilaterally Heart: regular rate and rhythm, S1, S2 normal, no murmur, click, rub or gallop Extremities: extremities normal, atraumatic, no cyanosis or edema Pulses: 2+ and symmetric Skin: Skin color, texture, turgor normal. No rashes or lesions Neurologic: Grossly normal  EKG sinus rhythm at 79 with nonspecific ST and T wave changes.  I personally reviewed this EKG.  ASSESSMENT AND PLAN:    PVD (peripheral vascular disease) ([place]) Amanda [name] was referred to me by Amanda [name] initially for claudication.  I performed peripheral angiography on her 08/01/2020 revealing high-grade segmental mid left SFA stenosis.  I performed [facility] 1 directional atherectomy followed by drug-coated balloon angioplasty.  She had two-vessel runoff with occluded anterior tibial.  Her claudication has resolved.  Her most recent Doppler studies performed 02/21/2021 revealed normal ABIs bilaterally with normal velocities in the left SFA.  We will get lower extremity arterial Doppler studies in 1 year.     Amanda Harp MD FACP,FACC,FAHA, [facility] 02/28/2021 1:41 PM

## 2021-02-28 NOTE — Assessment & Plan Note (Signed)
Amanda [name] was referred to me by Dr. Johney Frame initially for claudication.  I performed peripheral angiography on her 08/01/2020 revealing high-grade segmental mid left SFA stenosis.  I performed [facility] 1 directional atherectomy followed by drug-coated balloon angioplasty.  She had two-vessel runoff with occluded anterior tibial.  Her claudication has resolved.  Her most recent Doppler studies performed 02/21/2021 revealed normal ABIs bilaterally with normal velocities in the left SFA.  We will get lower extremity arterial Doppler studies in 1 year.

## 2021-03-07 ENCOUNTER — Ambulatory Visit: Payer: Medicare Other | Admitting: Physician Assistant

## 2021-03-15 ENCOUNTER — Encounter: Payer: Self-pay | Admitting: Gastroenterology

## 2021-03-27 DIAGNOSIS — H43811 Vitreous degeneration, right eye: Secondary | ICD-10-CM | POA: Diagnosis not present

## 2021-03-27 DIAGNOSIS — H3561 Retinal hemorrhage, right eye: Secondary | ICD-10-CM | POA: Diagnosis not present

## 2021-03-27 DIAGNOSIS — H34212 Partial retinal artery occlusion, left eye: Secondary | ICD-10-CM | POA: Diagnosis not present

## 2021-03-29 ENCOUNTER — Ambulatory Visit: Payer: Medicare Other | Admitting: Nurse Practitioner

## 2021-03-29 ENCOUNTER — Encounter: Payer: Self-pay | Admitting: Nurse Practitioner

## 2021-03-29 ENCOUNTER — Other Ambulatory Visit: Payer: Self-pay

## 2021-03-29 VITALS — BP 138/70 | HR 72 | Ht 65.0 in | Wt 160.4 lb

## 2021-03-29 DIAGNOSIS — I2510 Atherosclerotic heart disease of native coronary artery without angina pectoris: Secondary | ICD-10-CM | POA: Diagnosis not present

## 2021-03-29 DIAGNOSIS — I77810 Thoracic aortic ectasia: Secondary | ICD-10-CM

## 2021-03-29 DIAGNOSIS — E785 Hyperlipidemia, unspecified: Secondary | ICD-10-CM

## 2021-03-29 DIAGNOSIS — F419 Anxiety disorder, unspecified: Secondary | ICD-10-CM

## 2021-03-29 DIAGNOSIS — I10 Essential (primary) hypertension: Secondary | ICD-10-CM

## 2021-03-29 DIAGNOSIS — I739 Peripheral vascular disease, unspecified: Secondary | ICD-10-CM | POA: Diagnosis not present

## 2021-03-29 DIAGNOSIS — R011 Cardiac murmur, unspecified: Secondary | ICD-10-CM

## 2021-03-29 LAB — COMPREHENSIVE METABOLIC PANEL
ALT: 6 IU/L (ref 0–32)
AST: 19 IU/L (ref 0–40)
Albumin/Globulin Ratio: 2.1 (ref 1.2–2.2)
Albumin: 4.4 g/dL (ref 3.7–4.7)
Alkaline Phosphatase: 84 IU/L (ref 44–121)
BUN/Creatinine Ratio: 9 — ABNORMAL LOW (ref 12–28)
BUN: 8 mg/dL (ref 8–27)
Bilirubin Total: 0.3 mg/dL (ref 0.0–1.2)
CO2: 27 mmol/L (ref 20–29)
Calcium: 9 mg/dL (ref 8.7–10.3)
Chloride: 99 mmol/L (ref 96–106)
Creatinine, Ser: 0.91 mg/dL (ref 0.57–1.00)
Globulin, Total: 2.1 g/dL (ref 1.5–4.5)
Glucose: 91 mg/dL (ref 70–99)
Potassium: 3.8 mmol/L (ref 3.5–5.2)
Sodium: 137 mmol/L (ref 134–144)
Total Protein: 6.5 g/dL (ref 6.0–8.5)
eGFR: 67 mL/min/{1.73_m2} (ref >59)

## 2021-03-29 LAB — LIPID PANEL
Chol/HDL Ratio: 3.7 ratio (ref 0.0–4.4)
Cholesterol, Total: 145 mg/dL (ref 100–199)
HDL: 39 mg/dL — ABNORMAL LOW (ref >39)
LDL Chol Calc (NIH): 71 mg/dL (ref 0–99)
Triglycerides: 212 mg/dL — ABNORMAL HIGH (ref 0–149)
VLDL Cholesterol Cal: 35 mg/dL (ref 5–40)

## 2021-03-29 NOTE — Progress Notes (Signed)
Cardiology Office Note:    Date:  03/29/2021   ID:  BRENDAN GRUWELL, DOB 1945-03-23, MRN [id number]  PCP:  Guerry Minors, [facility] Cardiologist:  Freada Bergeron, MD  Referring MD: Guerry Minors, FNP   Chief Complaint: 6 month f/u chronic HFpEF, CAD, hypertension  History of Present Illness:    Amanda [name] is a 76 y.o. female with a hx of CAD, HTN, chronic HFpEF, PAD with claudication, GERD, esophageal stricture, and arthritis.  Her cardiac history is significant for stress echo cardiogram October 2015 for chest pain with EKG changes and possible anteroapical wall motion abnormality.  Cardiac catheterization November 2015 revealed 70% nondominant RCA stenosis, LVEF 55 to 65%, with medical therapy recommended.  Subsequent nuclear study December 2019 showed ejection fraction 62%, poor quality study with questionable small apical infarct and mild inferior basilar ischemia.  Coronary CTA January 2020 showed calcium score 53 and mild stenosis of pLAD treated medically.  Most recent echocardiogram revealed LVEF 60 to 65%, no regional wall abnormalities, no LVH, G1DD, trivial TR, trivial AI, mild dilatation of the ascending aorta measuring 42 mm.  She was last seen in our office by Dr. Johney Frame on 07/04/20 with annual monitoring of aortic dilatation with TTE/CT/MR recommended by Dr. Johney Frame.  She is followed by Dr. Gwenlyn Found for PAD and had peripheral angiography on 08/01/20 revealing high-grade segmental mid left SFA stenosis with two-vessel runoff, 50% mid right SFA stenosis. Directional atherectomy was performed followed by drug-coated balloon angioplasty with excellent result. Dopplers on 02/21/21 revealed normal ABIs bilaterally with a widely patent left SFA. Recommendation to repeat doppler studies in 1 year.   She is here today with her daughter for 54-monthfollow-up of her CAD, chronic HFpEF, hypertension.  She reports she recently saw her eye doctor and was advised  to follow[place]up [place] that was seen on her eye exam.  She states the optometrist recommended carotid ultrasound.  She reports recent history of nagging headaches occurring occasionally without n/v, and no other associated symptoms.  She is also seeing black dots which is what led her to make an appointment with her optometrist.  Reports noticing that it is more difficult for her to catch her breath recently. She denies PND, orthopnea.  Reports creased anxiety since losing her husband about 15 months ago. She denies chest pain, lower extremity edema, fatigue, palpitations, melena, hematuria, hemoptysis, diaphoresis, weakness, presyncope, and syncope.  She stopped taking her Repatha about August 2022 due to cost (from $37-$122).  Her daughter reports concerns about the patient's level of grief.    Past Medical History:  Diagnosis Date   Arthritis    Back pain    CAD (coronary artery disease)    a. 01/2014: cath showing 70% stenosis of non-dominant RCA --> medically managed.    Cataract    bilat removed   Diverticulosis    GERD (gastroesophageal reflux disease)    Hemorrhoids    Hiatal hernia    Hyperlipidemia    Hypertension    Melanoma ([place]    [place]bital fracture ([place]    right   Osteopenia 07/2015   T score -2.0 FRAX 10%/1.4%   Pituitary tumor    Stricture of esophagus    Vitamin D deficiency     Past Surgical History:  Procedure Laterality Date   ABDOMINAL AORTOGRAM W/LOWER EXTREMITY N/A 08/01/2020   Procedure: ABDOMINAL AORTOGRAM W/LOWER EXTREMITY;  Surgeon: BLorretta Harp MD;  [facility];  Service: Cardiovascular;  Laterality: N/A;   CERVICAL DISC SURGERY     x 2   COLONOSCOPY     EXCISION OF SKIN CANCER     Melanoma   FOOT SURGERY     HEMORRHOID BANDING     X3   LEFT HEART CATHETERIZATION WITH CORONARY ANGIOGRAM N/A 02/03/2014   Procedure: LEFT HEART CATHETERIZATION WITH CORONARY ANGIOGRAM;  Surgeon: [name], MD;  Location: [facility] CATH LAB;   Service: Cardiovascular;  Laterality: N/A;   [place] SURGERY  2018   OOPHORECTOMY  2012   BSO   PELVIC LAPAROSCOPY  2012   Diag Lap-BSO-lysis of adhesions   PERIPHERAL VASCULAR INTERVENTION Left 08/01/2020   Procedure: PERIPHERAL VASCULAR INTERVENTION;  Surgeon: Lorretta Harp, MD;  Location: [facility];  Service: Cardiovascular;  Laterality: Left;   TUBAL LIGATION     VAGINAL HYSTERECTOMY  1993    Current Medications: Current Meds  Medication Sig   Cholecalciferol (VITAMIN D PO) Take 2,000 Units by mouth daily.   clopidogrel (PLAVIX) 75 MG tablet Take 1 tablet (75 mg total) by mouth daily with breakfast.   diphenhydrAMINE HCl, Sleep, (SLEEP AID) 50 MG CAPS Take 50 mg by mouth at bedtime.   fluticasone (FLONASE) 50 MCG/ACT nasal spray Place 1 spray into both nostrils daily as needed for allergies.   hydrochlorothiazide (HYDRODIURIL) 25 MG tablet Take 1 tablet (25 mg total) by mouth daily.   HYDROcodone-acetaminophen (NORCO) 10-325 MG tablet Take 1 tablet by mouth every 6 (six) hours as needed (Back pain).   LORazepam (ATIVAN) 0.5 MG tablet Take 0.5 mg by mouth daily as needed for anxiety.   losartan (COZAAR) 100 MG tablet Take 1 tablet (100 mg total) by mouth at bedtime.   pantoprazole (PROTONIX) 40 MG tablet TAKE ONE TABLET BY MOUTH DAILY   sertraline (ZOLOFT) 50 MG tablet Take 50 mg by mouth daily.   Current Facility-Administered Medications for the 03/29/21 encounter (Office Visit) with Ann Maki, Lanice Schwab, NP  Medication   sodium chloride flush (NS) 0.9 % injection 3 mL     Allergies:   Ciprofloxacin, Lisinopril, Pneumococcal vaccine, Pravastatin, Amlodipine, Crestor [rosuvastatin], Lipitor [atorvastatin], and Zetia [ezetimibe]   Social History   Socioeconomic History   Marital status: Married    Spouse name: Not on file   Number of children: 3   Years of education: Not on file   Highest education level: Not on file  Occupational History   Occupation:  Disabled  Tobacco Use   Smoking status: Former    Types: Cigarettes    Quit date: 03/19/1994    Years since quitting: 27.0   Smokeless tobacco: Never  Vaping Use   Vaping Use: Never used  Substance and Sexual Activity   Alcohol use: No    Alcohol/week: 0.0 standard drinks   Drug use: No   Sexual activity: Not Currently    Birth control/protection: Surgical    Comment: 1st intercourse 76 yo-Fewer than 5 partners,des neg  Other Topics Concern   Not on file  Social History Narrative   Not on file   Social Determinants of Health   Financial Resource Strain: Not on file  Food Insecurity: Not on file  Transportation Needs: Not on file  Physical Activity: Not on file  Stress: Not on file  Social Connections: Not on file     Family History: The patient's family history includes Cancer in her brother and father; Colon cancer in her brother, father, and sister;  Colon polyps in her brother, father, and sister; Diabetes in her brother and sister; Heart disease in her brother, father, mother, and sister; Hypertension in her father and mother; Melanoma in her brother, brother, and sister; Stroke in her mother. There is no history of Esophageal cancer, Rectal cancer, or Stomach cancer.  ROS:   Please see the history of present illness.    + breathlessness with exertion All other systems reviewed and are negative.  Labs/Other Studies Reviewed:    The following studies were reviewed today:  Echo 05/06/20  Left Ventricle: Left ventricular ejection fraction, by estimation, is 60  to 65%. Left ventricular ejection fraction by 3D volume is 63 %. The left  ventricle has normal function. The left ventricle has no regional wall  motion abnormalities. The average left ventricular global longitudinal strain is -24.3 %. The global longitudinal strain is normal. The left ventricular internal cavity size  was normal in size. There is no left ventricular hypertrophy. Left ventricular diastolic  parameters are consistent  with Grade I diastolic dysfunction (impaired relaxation).  Right Ventricle: The right ventricular size is normal. No increase in  right ventricular wall thickness. Right ventricular systolic function is  normal. There is normal pulmonary artery systolic pressure. The tricuspid  regurgitant velocity is 2.54 m/s, and  with an assumed right atrial pressure of 3 mmHg, the estimated right ventricular systolic pressure is 04.5 mmHg.  Left Atrium: Left atrial size was normal in size.  Right Atrium: Right atrial size was normal in size.  Pericardium: There is no evidence of pericardial effusion.  Mitral Valve: The mitral valve is grossly normal. Mild to moderate mitral  annular calcification. No evidence of mitral valve regurgitation.  Tricuspid Valve: The tricuspid valve is grossly normal. Tricuspid valve  regurgitation is trivial.  Aortic Valve: The aortic valve is tricuspid. There is mild aortic valve  annular calcification. Aortic valve regurgitation is trivial. No aortic  stenosis is present. Aortic valve mean gradient measures 5.0 mmHg. Aortic  valve peak gradient measures 10.4 mmHg. Aortic valve area, by VTI measures 3.16 cm.  Pulmonic Valve: The pulmonic valve was grossly normal. Pulmonic valve  regurgitation is mild. No evidence of pulmonic stenosis.  Aorta: Aortic dilatation noted. There is mild dilatation of the aortic  root, measuring 42 mm. There is mild dilatation of the ascending aorta,  measuring 42 mm.  IAS/Shunts: The atrial septum is grossly normal.   Coronary CT 03/26/18  IMPRESSION: LAD system: Mixed plaque in the proximal LAD with mild (<50%) stenosis. 1.Coronary artery calcium score 53 Agatston units. This places the patient in the 64th percentile for age and gender, suggesting intermediate risk for future cardiac events. 2.  Nonobstructive disease in the proximal LAD.   Recent Labs: 08/02/2020: BUN 12; Creatinine, Ser 0.78; Hemoglobin 11.4;  Platelets 207; Potassium 3.4; Sodium 140 12/15/2020: ALT 8   Recent Lipid Panel    Component Value Date/Time   CHOL 129 12/15/2020 0935   TRIG 145 12/15/2020 0935   HDL 37 (L) 12/15/2020 0935   CHOLHDL 3.5 12/15/2020 0935   LDLCALC 67 12/15/2020 0935     Physical Exam:    VS:  BP 138/70    Pulse 72    Ht 5' 5" (1.651 m)    Wt 160 lb 7 oz (72.8 kg)    SpO2 97%    BMI 26.70 kg/m     Wt Readings from Last 3 Encounters:  03/29/21 160 lb 7 oz (72.8 kg)  02/28/21 159 lb  3.2 oz (72.2 kg)  08/25/20 158 lb (71.7 kg)     GEN:  Well nourished, well developed in no acute distress HEENT: Normal NECK: No JVD; No carotid bruits LYMPHATICS: No lymphadenopathy CARDIAC: RRR, 3/6 systolic murmur auscultated at Rt and Lt USB. No rubs or gallops RESPIRATORY:  Clear to auscultation without rales, wheezing or rhonchi  ABDOMEN: Soft, non-tender, non-distended MUSCULOSKELETAL:  No edema; No deformity  SKIN: Warm and dry NEUROLOGIC:  Alert and oriented x 3 PSYCHIATRIC:  Normal affect   EKG:  EKG is not ordered today.    Diagnoses:    1. Ascending aorta dilatation (HCC)   2. Coronary artery disease involving native coronary artery of native heart without angina pectoris   3. Hyperlipidemia LDL goal <70   4. Peripheral arterial disease ([place])   5. Essential hypertension   6. Murmur   7. Anxiety    Assessment and Plan:     CAD without angina: Mild non-obstructive disease by coronary CT 1/20. She denies chest pain. She reports more breathlessness with activity recently. She does not get regular exercise. Last echo 2/22 revealed LVEF 60-65%, G1DD, normal RV. If echo reveals wall motion abnormality, will discuss/pursue additional ischemia evaluation.   Dyspnea on exertion/Chronic HFpEF: She reports more breathlessness with activity recently. Last echo 2/22 revealed LVEF 60 to 65%, G!DD, nl RV. She is not exercising on a regular basis but notes symptoms with exertion. We will repeat  echocardiogram. In the setting of diastolic dysfunction, we could change diuretic from hctz to furosemide to see if symptoms improve. Continue losartan, hctz.   Systolic murmur: Systolic murmur 3/6 noted left and right upper sternal borders.  Trivial AI, trivial TR noted on echo 2/22. She has DOE. She denies chest pain, palpitations, presyncope, syncope. We will recheck echo for assessment of valvular function.   Ascending aorta dilatation: Dilatation of 42 mm on coronary CT 1/20 and echo 2/22. Recommendation to follow annually with echo/CT/MR by Dr. Johney Frame. Imaging is due Feb 2023. In the setting of DOE and systolic murmur, we will get an echocardiogram. If significant change in size of dilatation, would favor getting CTA.   Essential hypertension: BP is slightly elevated today.  She has not been monitoring on a consistent basis at home but reports occasional high and low readings.  I have asked her to monitor 1 week of consistent blood pressures at least 2 hours after morning medications and call back to report. If BP remains elevated, would favor addition of amlodipine or change in diuretic therapy as noted above. Continue losartan, hctz.   Hyperlipidemia LDL goal < 70: LDL 67 9/22. She stopped Repatha due to cost.  Also stopped ezetimibe but does not recall the specific reason, thinks it may be related to leg pain.  We will recheck lipid today and have asked permission to really send Repatha Rx to see if it is affordable at this time.  Message sent to pharmacist for assistance with Repatha approval.  She is intolerant to statins.  PAD: Followed by Dr. Gwenlyn Found. On Plavix, no longer taking aspirin. States someone told her to stop it. CBC 12/26/20 with stable blood counts. She denies bleeding concerns.   Anxiety: Daughter is very concerned about patient's anxiety since the death of her husband. She is on daily SSRI and Xanax prn. Encouraged involvement in activities outside the home and local support  group. Encouraged her to reach out to PCP if she feels that current SSRI is not effective.   Atherosclerosis  seen on recent eye exam: Patient was advised by optometrist to follow-up with [place] regarding atherosclerosis seen on recent eye exam.  Will order carotid ultrasound as we do not have record of past carotid study.   Disposition: 6 months with Dr. Johney Frame or APP   Medication Adjustments/Labs and Tests Ordered: Current medicines are reviewed at length with the patient today.  Concerns regarding medicines are outlined above.  Orders Placed This Encounter  Procedures   Comp Met (CMET)   Lipid Profile   ECHOCARDIOGRAM COMPLETE   VAS US CAROTID   No orders of the defined types were placed in this encounter.   Patient Instructions  Medication Instructions:   Your physician recommends that you continue on your current medications as directed. Please refer to the Current Medication list given to you today.  *If you need a refill on your cardiac medications before your next appointment, please call your pharmacy*   Lab Work:  TODAY!!!! CMET/LIPID  If you have labs (blood work) drawn today and your tests are completely normal, you will receive your results only by: [place] (if you have MyChart) OR A paper copy in the mail If you have any lab test that is abnormal or we need to change your treatment, we will call you to review the results.   Testing/Procedures:  Your physician has requested that you have an echocardiogram. Echocardiography is a painless test that uses sound waves to create images of your heart. It provides your doctor with information about the size and shape of your heart and how well your hearts chambers and valves are working. This procedure takes approximately one hour. There are no restrictions for this procedure.  Your physician has requested that you have a carotid duplex. This test is an ultrasound of the carotid arteries in your neck. It looks at  blood flow through these arteries that supply the brain with blood. Allow one hour for this exam. There are no restrictions or special instructions.    Follow-Up: At [facility], you and your health needs are our priority.  As part of our continuing mission to provide you with exceptional heart care, we have created designated Provider Care Teams.  These Care Teams include your primary Cardiologist (physician) and Advanced Practice Providers (APPs -  Physician Assistants and Nurse Practitioners) who all work together to provide you with the care you need, when you need it.  We recommend signing up for the patient portal called "MyChart".  Sign up information is provided on this After Visit Summary.  MyChart is used to connect with patients for Virtual Visits (Telemedicine).  Patients are able to view lab/test results, encounter notes, upcoming appointments, etc.  Non-urgent messages can be sent to your provider as well.   To learn more about what you can do with MyChart, go to [email or web address].    Your next appointment:   6 month(s)  The format for your next appointment:   In Person  Provider:   Freada Bergeron, MD     Other Instructions  Please send in blood pressure X 1 week either call @ [phone] or send in by mail:  [address] [place], [place] [address]    Signed, Emmaline Life, NP  03/29/2021 11:20 AM    [place]

## 2021-03-29 NOTE — Patient Instructions (Signed)
Medication Instructions:   Your physician recommends that you continue on your current medications as directed. Please refer to the Current Medication list given to you today.  *If you need a refill on your cardiac medications before your next appointment, please call your pharmacy*   Lab Work:  TODAY!!!! CMET/LIPID  If you have labs (blood work) drawn today and your tests are completely normal, you will receive your results only by: [place] (if you have MyChart) OR A paper copy in the mail If you have any lab test that is abnormal or we need to change your treatment, we will call you to review the results.   Testing/Procedures:  Your physician has requested that you have an echocardiogram. Echocardiography is a painless test that uses sound waves to create images of your heart. It provides your doctor with information about the size and shape of your heart and how well your hearts chambers and valves are working. This procedure takes approximately one hour. There are no restrictions for this procedure.  Your physician has requested that you have a carotid duplex. This test is an ultrasound of the carotid arteries in your neck. It looks at blood flow through these arteries that supply the brain with blood. Allow one hour for this exam. There are no restrictions or special instructions.    Follow-Up: At [facility], you and your health needs are our priority.  As part of our continuing mission to provide you with exceptional heart care, we have created designated Provider Care Teams.  These Care Teams include your primary Cardiologist (physician) and Advanced Practice Providers (APPs -  Physician Assistants and Nurse Practitioners) who all work together to provide you with the care you need, when you need it.  We recommend signing up for the patient portal called "MyChart".  Sign up information is provided on this After Visit Summary.  MyChart is used to connect with patients  for Virtual Visits (Telemedicine).  Patients are able to view lab/test results, encounter notes, upcoming appointments, etc.  Non-urgent messages can be sent to your provider as well.   To learn more about what you can do with MyChart, go to [email or web address].    Your next appointment:   6 month(s)  The format for your next appointment:   In Person  Provider:   Freada Bergeron, MD     Other Instructions  Please send in blood pressure X 1 week either call @ [phone] or send in by mail:  [address] [place], [place] [address]

## 2021-03-30 ENCOUNTER — Telehealth: Payer: Self-pay

## 2021-03-30 DIAGNOSIS — R220 Localized swelling, mass and lump, head: Secondary | ICD-10-CM | POA: Diagnosis not present

## 2021-03-30 DIAGNOSIS — E785 Hyperlipidemia, unspecified: Secondary | ICD-10-CM

## 2021-03-30 DIAGNOSIS — Z789 Other specified health status: Secondary | ICD-10-CM

## 2021-03-30 MED ORDER — REPATHA SURECLICK 140 MG/ML ~~LOC~~ SOAJ: 140.0000 mg | SUBCUTANEOUS | 11 refills | Status: DC

## 2021-03-30 NOTE — Telephone Encounter (Signed)
Called and lmom the pt that they were approved for repatha, rx sent, healthwell information was added on rx as note to pharmacy, and lipid/hepatic ordered for post 4th dose.  Healthwell information is as follows: CARD NO. [id number]   CARD STATUS Active   BIN [id number]   PCN [id number]   PC GROUP [id number]   HELP DESK [phone]   PROVIDER PDMI   PROCESSOR PDMI

## 2021-03-31 DIAGNOSIS — D352 Benign neoplasm of pituitary gland: Secondary | ICD-10-CM

## 2021-03-31 HISTORY — DX: Benign neoplasm of pituitary gland: D35.2

## 2021-04-03 NOTE — Telephone Encounter (Signed)
Called and spoke w/pt who stated that they were able to get their medication

## 2021-04-05 ENCOUNTER — Other Ambulatory Visit: Payer: Self-pay | Admitting: Nurse Practitioner

## 2021-04-05 ENCOUNTER — Other Ambulatory Visit: Payer: Self-pay

## 2021-04-05 ENCOUNTER — Ambulatory Visit (HOSPITAL_COMMUNITY)
Admission: RE | Admit: 2021-04-05 | Discharge: 2021-04-05 | Disposition: A | Discharge status: Home / Self Care | Payer: Medicare Other | Source: Ambulatory Visit | Attending: Cardiovascular Disease | Admitting: Cardiovascular Disease

## 2021-04-05 DIAGNOSIS — H44811 Hemophthalmos, right eye: Secondary | ICD-10-CM | POA: Diagnosis not present

## 2021-04-05 DIAGNOSIS — I739 Peripheral vascular disease, unspecified: Secondary | ICD-10-CM

## 2021-04-05 DIAGNOSIS — I77810 Thoracic aortic ectasia: Secondary | ICD-10-CM

## 2021-04-05 DIAGNOSIS — H34212 Partial retinal artery occlusion, left eye: Secondary | ICD-10-CM | POA: Diagnosis not present

## 2021-04-05 DIAGNOSIS — I2510 Atherosclerotic heart disease of native coronary artery without angina pectoris: Secondary | ICD-10-CM

## 2021-04-05 DIAGNOSIS — E785 Hyperlipidemia, unspecified: Secondary | ICD-10-CM

## 2021-04-05 DIAGNOSIS — I10 Essential (primary) hypertension: Secondary | ICD-10-CM

## 2021-04-05 DIAGNOSIS — R011 Cardiac murmur, unspecified: Secondary | ICD-10-CM

## 2021-04-13 ENCOUNTER — Other Ambulatory Visit: Payer: Self-pay | Admitting: Cardiology

## 2021-04-13 DIAGNOSIS — I10 Essential (primary) hypertension: Secondary | ICD-10-CM

## 2021-04-13 DIAGNOSIS — R011 Cardiac murmur, unspecified: Secondary | ICD-10-CM

## 2021-04-14 ENCOUNTER — Other Ambulatory Visit: Payer: Self-pay

## 2021-04-14 ENCOUNTER — Ambulatory Visit (HOSPITAL_COMMUNITY): Payer: Medicare Other | Attending: Cardiovascular Disease

## 2021-04-14 DIAGNOSIS — I739 Peripheral vascular disease, unspecified: Secondary | ICD-10-CM | POA: Diagnosis not present

## 2021-04-14 DIAGNOSIS — I10 Essential (primary) hypertension: Secondary | ICD-10-CM | POA: Diagnosis not present

## 2021-04-14 DIAGNOSIS — I2510 Atherosclerotic heart disease of native coronary artery without angina pectoris: Secondary | ICD-10-CM

## 2021-04-14 DIAGNOSIS — E785 Hyperlipidemia, unspecified: Secondary | ICD-10-CM | POA: Diagnosis not present

## 2021-04-14 DIAGNOSIS — I77810 Thoracic aortic ectasia: Secondary | ICD-10-CM | POA: Insufficient documentation

## 2021-04-14 DIAGNOSIS — R011 Cardiac murmur, unspecified: Secondary | ICD-10-CM | POA: Diagnosis not present

## 2021-04-14 LAB — ECHOCARDIOGRAM COMPLETE
Area-P 1/2: 2.02 cm2
S' Lateral: 2.5 cm

## 2021-04-17 ENCOUNTER — Other Ambulatory Visit: Payer: Self-pay | Admitting: Cardiology

## 2021-04-17 DIAGNOSIS — I10 Essential (primary) hypertension: Secondary | ICD-10-CM

## 2021-04-18 ENCOUNTER — Ambulatory Visit: Payer: Medicare Other | Admitting: Gastroenterology

## 2021-04-18 ENCOUNTER — Other Ambulatory Visit (INDEPENDENT_AMBULATORY_CARE_PROVIDER_SITE_OTHER): Payer: Medicare Other

## 2021-04-18 ENCOUNTER — Encounter: Payer: Self-pay | Admitting: Gastroenterology

## 2021-04-18 VITALS — BP 114/62 | HR 80 | Ht 64.75 in | Wt 161.5 lb

## 2021-04-18 DIAGNOSIS — R1314 Dysphagia, pharyngoesophageal phase: Secondary | ICD-10-CM | POA: Diagnosis not present

## 2021-04-18 DIAGNOSIS — K219 Gastro-esophageal reflux disease without esophagitis: Secondary | ICD-10-CM | POA: Diagnosis not present

## 2021-04-18 DIAGNOSIS — Z800 Family history of malignant neoplasm of digestive organs: Secondary | ICD-10-CM

## 2021-04-18 DIAGNOSIS — R5383 Other fatigue: Secondary | ICD-10-CM

## 2021-04-18 DIAGNOSIS — Z7902 Long term (current) use of antithrombotics/antiplatelets: Secondary | ICD-10-CM | POA: Diagnosis not present

## 2021-04-18 LAB — CBC WITH DIFFERENTIAL/PLATELET
Basophils Absolute: 0 10*3/uL (ref 0.0–0.1)
Basophils Relative: 0.8 % (ref 0.0–3.0)
Eosinophils Absolute: 0.2 10*3/uL (ref 0.0–0.7)
Eosinophils Relative: 4.6 % (ref 0.0–5.0)
HCT: 38.4 % (ref 36.0–46.0)
Hemoglobin: 12.9 g/dL (ref 12.0–15.0)
Lymphocytes Relative: 33.1 % (ref 12.0–46.0)
Lymphs Abs: 1.6 10*3/uL (ref 0.7–4.0)
MCHC: 33.7 g/dL (ref 30.0–36.0)
MCV: 92.3 fl (ref 78.0–100.0)
Monocytes Absolute: 0.4 10*3/uL (ref 0.1–1.0)
Monocytes Relative: 9.2 % (ref 3.0–12.0)
Neutro Abs: 2.5 10*3/uL (ref 1.4–7.7)
Neutrophils Relative %: 52.3 % (ref 43.0–77.0)
Platelets: 244 10*3/uL (ref 150.0–400.0)
RBC: 4.17 Mil/uL (ref 3.87–5.11)
RDW: 13 % (ref 11.5–15.5)
WBC: 4.7 10*3/uL (ref 4.0–10.5)

## 2021-04-18 NOTE — Progress Notes (Signed)
[facility] Consult Note:  History: Amanda [name] 04/18/2021  Referring provider: Guerry Minors, [place]  Reason for consult/chief complaint: Family  Hx Of Colon Cancer (Father, brother, sister) and Colon Cancer Screening (Pt on Plavix)   Subjective  HPI: From my February 2019 office note: "This is a 76 year old woman known to me from a screening colonoscopy in November 2017 done for family history of colon cancer in her father and sister.  That procedure had a excellent preparation, and was normal other than left-sided diverticulosis. Over the last few months she has been bothered by pelvic pain that is more toward the right side, along with some urinary and vaginal symptoms.  She has been treated for urinary infections, though it is not clear any cultures grew out positive.  She has been treated for yeast infections as well and had a gynecologic examination by Dr. Phineas Real this past November.  I reviewed his note, which mention she has had a TVH/BSO , was also having some hot flashes and he gave further treatment for possible yeast infection and did a bimanual exam. When I saw Amanda [name] in 2017, she was bothered by chronic opioid-induced constipation for which I started Linzess.She had back surgery last year and now off opioids. EGD Deatra Ina 2008 for stricture - on PPI since then.   She is here to see me for follow-up of reflux.  She has chronic heartburn and has been on a PPI for probably at least 10 years.  She has lately started cutting it back to every other day, and with that we will only have some occasional brief heartburn with certain foods.  She denies dysphagia or odynophagia." _________________________________   Amanda [name] was here to discuss colon cancer screening in the setting of family history.  She also has longstanding GERD for which she takes daily PPI, having been changed from omeprazole to pantoprazole when she began Plavix last year. She has intermittent  dysphagia when eating cornbread, and it feels hung up in the throat.  She is able to eat meat without difficulty.  Appetite generally good and weight stable.  Bowel habits regular without rectal bleeding.   ROS:  Review of Systems  Constitutional:  Positive for fatigue. Negative for appetite change and unexpected weight change.  HENT:  Negative for mouth sores and voice change.   Eyes:  Negative for pain and redness.  Respiratory:  Positive for shortness of breath. Negative for cough.   Cardiovascular:  Negative for chest pain and palpitations.  Genitourinary:  Negative for dysuria and hematuria.  Musculoskeletal:  Positive for arthralgias. Negative for myalgias.  Skin:  Negative for pallor and rash.  Neurological:  Negative for weakness and headaches.  Hematological:  Negative for adenopathy.    Past Medical History: Past Medical History:  Diagnosis Date   Anxiety    Arthritis    Back pain    CAD (coronary artery disease)    a. 01/2014: cath showing 70% stenosis of non-dominant RCA --> medically managed.    Cataract    bilat removed   Depression    Diverticulosis    GERD (gastroesophageal reflux disease)    Hemorrhoids    Hiatal hernia    Hyperlipidemia    Hypertension    Melanoma ([place])    Orbital fracture ([place])    right   Osteopenia 07/2015   T score -2.0 FRAX 10%/1.4%   Osteoporosis 08/29/2015   Pituitary tumor    Stricture of esophagus    Vitamin D deficiency  From 03/29/2021 cardiology office note: "CAD without angina: Mild non-obstructive disease by coronary CT 1/20. She denies chest pain. She reports more breathlessness with activity recently. She does not get regular exercise. Last echo 2/22 revealed LVEF 60-65%, G1DD, normal RV. If echo reveals wall motion abnormality, will discuss/pursue additional ischemia evaluation.    Dyspnea on exertion/Chronic HFpEF: She reports more breathlessness with activity recently. Last echo 2/22 revealed LVEF 60 to 65%, G!DD,  nl RV. She is not exercising on a regular basis but notes symptoms with exertion. We will repeat echocardiogram. In the setting of diastolic dysfunction, we could change diuretic from hctz to furosemide to see if symptoms improve. Continue losartan, hctz.    Systolic murmur: Systolic murmur 3/6 noted left and right upper sternal borders.  Trivial AI, trivial TR noted on echo 2/22. She has DOE. She denies chest pain, palpitations, presyncope, syncope. We will recheck echo for assessment of valvular function.    Ascending aorta dilatation: Dilatation of 42 mm on coronary CT 1/20 and echo 2/22. Recommendation to follow annually with echo/CT/MR by Dr. Johney Frame. Imaging is due Feb 2023. In the setting of DOE and systolic murmur, we will get an echocardiogram. If significant change in size of dilatation, would favor getting CTA.    Essential hypertension: BP is slightly elevated today.  She has not been monitoring on a consistent basis at home but reports occasional high and low readings.  I have asked her to monitor 1 week of consistent blood pressures at least 2 hours after morning medications and call back to report. If BP remains elevated, would favor addition of amlodipine or change in diuretic therapy as noted above. Continue losartan, hctz.    Hyperlipidemia LDL goal < 70: LDL 67 9/22. She stopped Repatha due to cost.  Also stopped ezetimibe but does not recall the specific reason, thinks it may be related to leg pain.  We will recheck lipid today and have asked permission to really send Repatha Rx to see if it is affordable at this time.  Message sent to pharmacist for assistance with Repatha approval.  She is intolerant to statins.   PAD: Followed by Dr. Gwenlyn Found. On Plavix, no longer taking aspirin. States someone told her to stop it. CBC 12/26/20 with stable blood counts. She denies bleeding concerns.    Anxiety: Daughter is very concerned about patient's anxiety since the death of her husband. She is  on daily SSRI and Xanax prn. Encouraged involvement in activities outside the home and local support group. Encouraged her to reach out to PCP if she feels that current SSRI is not effective.    Atherosclerosis seen on recent eye exam: Patient was advised by optometrist to follow-up with [place] regarding atherosclerosis seen on recent eye exam.  Will order carotid ultrasound as we do not have record of past carotid study.    Disposition: 6 months with Dr. Johney Frame or APP"   Dec '22 Dr Gwenlyn Found note reviewed as well  Past Surgical History: Past Surgical History:  Procedure Laterality Date   ABDOMINAL AORTOGRAM W/LOWER EXTREMITY N/A 08/01/2020   Procedure: ABDOMINAL AORTOGRAM W/LOWER EXTREMITY;  Surgeon: Lorretta Harp, MD;  Location: [facility];  Service: Cardiovascular;  Laterality: N/A;   CERVICAL DISC SURGERY     x 2   COLONOSCOPY     EXCISION OF SKIN CANCER     Melanoma   FOOT SURGERY Right    HEMORRHOID BANDING     X3   LEFT HEART CATHETERIZATION  WITH CORONARY ANGIOGRAM N/A 02/03/2014   Procedure: LEFT HEART CATHETERIZATION WITH CORONARY ANGIOGRAM;  Surgeon: [name], MD;  Location: [facility];  Service: Cardiovascular;  Laterality: N/A;   [place] SURGERY  2018   OOPHORECTOMY  2012   BSO   PELVIC LAPAROSCOPY  2012   Diag Lap-BSO-lysis of adhesions   PERIPHERAL VASCULAR INTERVENTION Left 08/01/2020   Procedure: PERIPHERAL VASCULAR INTERVENTION;  Surgeon: Lorretta Harp, MD;  Location: [facility];  Service: Cardiovascular;  Laterality: Left;   TUBAL LIGATION     VAGINAL HYSTERECTOMY  1993   Peripheral arterial procedure with atherectomy Procedures Performed:               1.  Ultrasound-guided right common femoral access               2.  Abdominal aortogram/bilateral iliac angiogram/bifemoral runoff               3.  Contralateral access (secondary cath placement)               4.  Placement of spider distal protection device left above-the-knee  popliteal artery               5.  Hawk 1 directional atherectomy left SFA               6.  DCB left SFA               7.  Right common femoral angiogram followed by successful deployment of a Mynx closure device.  Family History: Family History  Problem Relation Age of Onset   Heart disease Mother    Hypertension Mother    Stroke Mother    Colon polyps Father    Heart disease Father        CHF   Hypertension Father    Cancer Father        COLON   Colon cancer Father    Colon cancer Sister    Colon polyps Sister    Heart disease Sister    Diabetes Sister    Melanoma Sister    Heart disease Sister    Heart disease Sister    Colon polyps Brother    Diabetes Brother    Heart disease Brother    Cancer Brother        COLON   Melanoma Brother    Colon cancer Brother    Melanoma Brother    Skin cancer Brother    Skin cancer Brother    Prostate cancer Brother    Skin cancer Daughter    Hypertension Daughter    Skin cancer Daughter    Migraines Daughter    Skin cancer Son    Esophageal cancer Neg Hx    Rectal cancer Neg Hx    Stomach cancer Neg Hx     Social History: Social History   Socioeconomic History   Marital status: Married    Spouse name: Not on file   Number of children: 3   Years of education: Not on file   Highest education level: Not on file  Occupational History   Occupation: Disabled/retired  Tobacco Use   Smoking status: Former    Types: Cigarettes    Quit date: 03/19/1994    Years since quitting: 27.1   Smokeless tobacco: Never  Vaping Use   Vaping Use: Never used  Substance and Sexual Activity   Alcohol use: No    Alcohol/week: 0.0 standard  drinks   Drug use: No   Sexual activity: Not Currently    Birth control/protection: Surgical    Comment: 1st intercourse 76 yo-Fewer than 5 partners,des neg  Other Topics Concern   Not on file  Social History Narrative   Not on file   Social Determinants of Health   Financial Resource Strain:  Not on file  Food Insecurity: Not on file  Transportation Needs: Not on file  Physical Activity: Not on file  Stress: Not on file  Social Connections: Not on file    Allergies: Allergies  Allergen Reactions   Ciprofloxacin Rash   Lisinopril Swelling   Pneumococcal Vaccine Swelling   Pravastatin Other (See Comments)    Muscle aches    Crestor [Rosuvastatin]     myalgias   Lipitor [Atorvastatin]     myalgias   Norvasc [Amlodipine]     swelling   Zetia [Ezetimibe]     "felt horrible"    Outpatient Meds: Current Outpatient Medications  Medication Sig Dispense Refill   AMBULATORY NON FORMULARY MEDICATION TAM herbal laxative Take 2 tablet by mouth every other day as needed     Cholecalciferol (VITAMIN D PO) Take 2,000 Units by mouth daily.     Cholecalciferol (VITAMIN D3) 50 MCG (2000 UT) TABS Take 2 tablets by mouth daily.     clopidogrel (PLAVIX) 75 MG tablet Take 1 tablet (75 mg total) by mouth daily with breakfast. 90 tablet 3   diclofenac Sodium (VOLTAREN) 1 % GEL Apply 2-4 g topically as needed.     diphenhydrAMINE HCl, Sleep, (SLEEP AID) 50 MG CAPS Take 50 mg by mouth at bedtime.     estradiol (ESTRACE) 0.1 MG/GM vaginal cream Place 1 Applicatorful vaginally at bedtime.     Evolocumab (REPATHA SURECLICK) 759 MG/ML SOAJ Inject 140 mg into the skin every 14 (fourteen) days. 2 mL 11   fluticasone (FLONASE) 50 MCG/ACT nasal spray Place 1 spray into both nostrils daily as needed for allergies.     hydrochlorothiazide (HYDRODIURIL) 25 MG tablet Take 1 tablet (25 mg total) by mouth daily. 90 tablet 3   HYDROcodone-acetaminophen (NORCO) 10-325 MG tablet Take 1 tablet by mouth every 6 (six) hours as needed (Back pain).     LORazepam (ATIVAN) 0.5 MG tablet Take 0.5 mg by mouth daily as needed for anxiety.     losartan (COZAAR) 100 MG tablet Take 1 tablet (100 mg total) by mouth at bedtime. 90 tablet 1   pantoprazole (PROTONIX) 40 MG tablet TAKE ONE TABLET BY MOUTH DAILY 60 tablet 1    sertraline (ZOLOFT) 50 MG tablet Take 50 mg by mouth daily.     Current Facility-Administered Medications  Medication Dose Route Frequency Provider Last Rate Last Admin   sodium chloride flush (NS) 0.9 % injection 3 mL  3 mL Intravenous Q12H Lorretta Harp, MD          ___________________________________________________________________ Objective   Exam:  BP 114/62 (BP Location: Left Arm, Patient Position: Sitting, Cuff Size: Normal)    Pulse 80    Ht 5' 4.75" (1.645 m) Comment: height measured without shoes   Wt 161 lb 8 oz (73.3 kg)    BMI 27.08 kg/m  Wt Readings from Last 3 Encounters:  04/18/21 161 lb 8 oz (73.3 kg)  03/29/21 160 lb 7 oz (72.8 kg)  02/28/21 159 lb 3.2 oz (72.2 kg)    General: Well-appearing, saddened affect discussed in the loss of her husband in September 2021 Eyes: sclera anicteric,  no redness ENT: oral mucosa moist without lesions, no cervical or supraclavicular lymphadenopathy CV: RRR without murmur, S1/S2, no JVD, no peripheral edema Resp: clear to auscultation bilaterally, normal RR and effort noted GI: soft, no tenderness, with active bowel sounds. No guarding or palpable organomegaly noted. Skin; warm and dry, no rash or jaundice noted Neuro: awake, alert and oriented x 3. Normal gross motor function and fluent speech  Data:   Echocardiogram 04/14/2021  1. Left ventricular ejection fraction, by estimation, is 60 to 65%. The  left ventricle has normal function. The left ventricle has no regional  wall motion abnormalities. There is severe left ventricular hypertrophy.  Left ventricular diastolic parameters   are consistent with Grade I diastolic dysfunction (impaired relaxation).   2. Right ventricular systolic function is normal. The right ventricular  size is normal.   3. Mobile / redundant possible PFO.   4. The mitral valve is normal in structure. No evidence of mitral valve  regurgitation. No evidence of mitral stenosis.   5. The  aortic valve is normal in structure. Aortic valve regurgitation is  mild. No aortic stenosis is present.   6. Aortic dilatation noted. There is mild dilatation of the aortic root,  measuring 38 mm. There is moderate dilatation of the ascending aorta,  measuring 42 mm.   7. The inferior vena cava is normal in size with greater than 50%  respiratory variability, suggesting right atrial pressure of 3 mmHg.    Cardiologist assessment and result note:  "Pumping function is normal at 60-65%. No LVH. No regional wall abnormalities to suggest ischemia. She has grade 1 diastolic dysfunction which is often seen with aging. She should maintain good BP control, healthy diet to prevent further stiffening. There is mention of small possible PFO. Images were reviewed by cardiologist and do not reveal shunting across the septum. No indication for further evaluation unless she has symptoms concerning for TIA or stroke. No change in treatment plan at this time. "   Labs:  CMP Latest Ref Rng & Units 03/29/2021 12/15/2020 08/02/2020  Glucose 70 - 99 mg/dL 91 - 99  BUN 8 - 27 mg/dL 8 - 12  Creatinine 0.57 - 1.00 mg/dL 0.91 - 0.78  Sodium 134 - 144 mmol/L 137 - 140  Potassium 3.5 - 5.2 mmol/L 3.8 - 3.4(L)  Chloride 96 - 106 mmol/L 99 - 107  CO2 20 - 29 mmol/L 27 - 28  Calcium 8.7 - 10.3 mg/dL 9.0 - 8.3(L)  Total Protein 6.0 - 8.5 g/dL 6.5 6.2 -  Total Bilirubin 0.0 - 1.2 mg/dL 0.3 0.3 -  Alkaline Phos 44 - 121 IU/L 84 81 -  AST 0 - 40 IU/L 19 18 -  ALT 0 - 32 IU/L 6 8 -   CBC Latest Ref Rng & Units 04/18/2021 08/02/2020 08/01/2020  WBC 4.0 - 10.5 K/uL 4.7 4.7 5.9  Hemoglobin 12.0 - 15.0 g/dL 12.9 11.4(L) 12.6  Hematocrit 36.0 - 46.0 % 38.4 34.8(L) 36.8  Platelets 150.0 - 400.0 K/uL 244.0 207 219    Note last CBC on file in [facility] epic EMR May 2022 Assessment: Encounter Diagnoses  Name Primary?   Family history of malignant neoplasm of gastrointestinal tract Yes   Pharyngoesophageal dysphagia     Gastroesophageal reflux disease without esophagitis    Long term (current) use of antithrombotics/antiplatelets    Other fatigue     Amanda [name] is due for a screening colonoscopy due to family history of colon cancer in multiple first-degree  relatives.  She also describes recurrence of dysphagia, though typically just when consuming cornbread but not when consuming meat.  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  Nevertheless, it is of concern to her and has previously required endoscopic evaluation over a decade ago.  She has fatigue and exertional dyspnea, most likely owing to her multiple medical issues despite their stability (evaluation noted above).  I am planning endoscopic evaluation with EGD (with possible dilation of stricture found) and colonoscopy.  She was agreeable after discussion of procedure and risks.  The benefits and risks of the planned procedure were described in detail with the patient or (when appropriate) their health care proxy.  Risks were outlined as including, but not limited to, bleeding, infection, perforation, adverse medication reaction leading to cardiac or pulmonary decompensation, pancreatitis (if ERCP).  The limitation of incomplete mucosal visualization was also discussed.  No guarantees or warranties were given.  Patient at increased risk for cardiopulmonary complications of procedure due to medical comorbidities and antiplatelet therapy.  However, I feel the benefits outweigh the risks.  Amanda [name] is under the impression that she had a stent placed during her peripheral procedure in May of last year, however that does not seem to be the case based on my reading of the report.  She also recalls being told she needed to take Plavix for a year afterward. Therefore, rather than schedule her procedure and perhaps need to change plans afterward, I will communicate with Dr. Gwenlyn Found first regarding management  of her Plavix.  It would need to be held 5 days prior to her endoscopic procedures when it is felt reasonably safe to do so. When I hear back from him regarding that, we will contact Amanda [name] and schedule her procedures accordingly.  CBC ordered today and resulted shortly after the visit is normal as noted above   Nelida Meuse III  CC: Primary care primary care provider noted above Also Dr. Val Riles (cardiology)

## 2021-04-18 NOTE — Patient Instructions (Signed)
If you are age 74 or older, your body mass index should be between 23-30. Your Body mass index is 27.08 kg/m. If this is out of the aforementioned range listed, please consider follow up with your Primary Care Provider.  If you are age 66 or younger, your body mass index should be between 19-25. Your Body mass index is 27.08 kg/m. If this is out of the aformentioned range listed, please consider follow up with your Primary Care Provider.   ________________________________________________________  The [facility] providers would like to encourage you to use [facility] to communicate with providers for non-urgent requests or questions.  Due to long hold times on the telephone, sending your provider a message by [facility] may be a faster and more efficient way to get a response.  Please allow 48 business hours for a response.  Please remember that this is for non-urgent requests.  _______________________________________________________  Your provider has requested that you go to the basement level for lab work before leaving today. Press "B" on the elevator. The lab is located at the first door on the left as you exit the elevator.  It was a pleasure to see you today!  Thank you for trusting me with your gastrointestinal care!

## 2021-04-21 ENCOUNTER — Other Ambulatory Visit: Payer: Self-pay

## 2021-04-21 DIAGNOSIS — Z800 Family history of malignant neoplasm of digestive organs: Secondary | ICD-10-CM

## 2021-04-21 DIAGNOSIS — R1314 Dysphagia, pharyngoesophageal phase: Secondary | ICD-10-CM

## 2021-04-21 DIAGNOSIS — Z7902 Long term (current) use of antithrombotics/antiplatelets: Secondary | ICD-10-CM

## 2021-04-21 DIAGNOSIS — K219 Gastro-esophageal reflux disease without esophagitis: Secondary | ICD-10-CM

## 2021-04-21 DIAGNOSIS — R5383 Other fatigue: Secondary | ICD-10-CM

## 2021-04-21 MED ORDER — PLENVU 140 G PO SOLR: 140.0000 g | ORAL | 0 refills | Status: DC

## 2021-04-25 ENCOUNTER — Telehealth: Payer: Self-pay | Admitting: *Deleted

## 2021-04-25 DIAGNOSIS — G9389 Other specified disorders of brain: Secondary | ICD-10-CM | POA: Diagnosis not present

## 2021-04-25 NOTE — Telephone Encounter (Signed)
-----   Message from Doran Stabler, MD sent at 04/19/2021  1:13 PM EST ----- Vivien Rota,  I saw this patient in clinic on 04/18/2021.  We did not schedule her EGD and colonoscopy in the [facility] because it was uncertain whether she would be able to briefly hold Plavix after a vascular procedure that was done last year.  I did hear back from her cardiologist, Dr. Gwenlyn Found, and the note is attached to the encounter that I routed to him.  He felt it was acceptable risk for Asti to be off Plavix 5 days prior to an upper endoscopy and colonoscopy.  (It sounds like they may put her back on aspirin, and she can certainly remain on that up to the time of her procedure per our usual protocol)  she needs to have those procedures scheduled with me in the [facility], please contact her for scheduling and send her prep instructions.  HD

## 2021-04-25 NOTE — Telephone Encounter (Signed)
I have spoken to Amanda [name] to advise that we have been given clearance by Dr Gwenlyn Found for her to have procedure while holding Plavix x 5 days prior to the procedure. I have also created her prep instructions and have mailed them to her home address. She verbalizes understanding of this information and is also aware that I will contact her in 1-2 weeks once she has had a chance to get the written instructions at which time we will verbally go over them and she can relay any questions she may have.

## 2021-05-02 NOTE — Telephone Encounter (Signed)
I have contacted patient regarding colonoscopy prep. She denies any questions regarding the prep and feels that she understands all instructions provided.

## 2021-05-05 ENCOUNTER — Telehealth: Payer: Self-pay | Admitting: Gastroenterology

## 2021-05-05 MED ORDER — PEG-KCL-NACL-NASULF-NA ASC-C 100 G PO SOLR: 1.0000 | Freq: Once | ORAL | 0 refills | Status: AC

## 2021-05-05 NOTE — Telephone Encounter (Signed)
Preferred is Moviprep. I have completed new instructions and mailed to the home address on file.

## 2021-05-05 NOTE — Telephone Encounter (Signed)
Inbound call from [facility]. States Plenvu is too expensive for patient and requesting an alternative

## 2021-05-08 DIAGNOSIS — H3561 Retinal hemorrhage, right eye: Secondary | ICD-10-CM | POA: Diagnosis not present

## 2021-05-31 ENCOUNTER — Other Ambulatory Visit: Payer: Self-pay

## 2021-05-31 ENCOUNTER — Other Ambulatory Visit: Payer: Self-pay | Admitting: Interventional Cardiology

## 2021-05-31 MED ORDER — PANTOPRAZOLE SODIUM 40 MG PO TBEC: 40.0000 mg | DELAYED_RELEASE_TABLET | Freq: Every day | ORAL | 1 refills | Status: DC

## 2021-05-31 NOTE — Progress Notes (Signed)
Refilled protonix

## 2021-06-05 ENCOUNTER — Encounter: Payer: Medicare Other | Admitting: Gastroenterology

## 2021-06-08 ENCOUNTER — Encounter: Payer: Medicare Other | Admitting: Gastroenterology

## 2021-06-13 DIAGNOSIS — M5033 Other cervical disc degeneration, cervicothoracic region: Secondary | ICD-10-CM | POA: Diagnosis not present

## 2021-06-13 DIAGNOSIS — R519 Headache, unspecified: Secondary | ICD-10-CM | POA: Diagnosis not present

## 2021-06-13 DIAGNOSIS — M961 Postlaminectomy syndrome, not elsewhere classified: Secondary | ICD-10-CM | POA: Diagnosis not present

## 2021-06-13 DIAGNOSIS — M5136 Other intervertebral disc degeneration, lumbar region: Secondary | ICD-10-CM | POA: Diagnosis not present

## 2021-06-14 ENCOUNTER — Other Ambulatory Visit: Payer: Self-pay

## 2021-06-14 MED ORDER — LOSARTAN POTASSIUM 100 MG PO TABS: 100.0000 mg | ORAL_TABLET | Freq: Every day | ORAL | 3 refills | Status: DC

## 2021-07-03 DIAGNOSIS — I10 Essential (primary) hypertension: Secondary | ICD-10-CM | POA: Diagnosis not present

## 2021-07-07 DIAGNOSIS — F324 Major depressive disorder, single episode, in partial remission: Secondary | ICD-10-CM | POA: Diagnosis not present

## 2021-07-07 DIAGNOSIS — F419 Anxiety disorder, unspecified: Secondary | ICD-10-CM | POA: Diagnosis not present

## 2021-07-07 DIAGNOSIS — I10 Essential (primary) hypertension: Secondary | ICD-10-CM | POA: Diagnosis not present

## 2021-07-07 DIAGNOSIS — K219 Gastro-esophageal reflux disease without esophagitis: Secondary | ICD-10-CM | POA: Diagnosis not present

## 2021-07-10 ENCOUNTER — Other Ambulatory Visit: Payer: Self-pay | Admitting: *Deleted

## 2021-07-10 MED ORDER — HYDROCHLOROTHIAZIDE 25 MG PO TABS: 25.0000 mg | ORAL_TABLET | Freq: Every day | ORAL | 0 refills | Status: DC

## 2021-07-13 ENCOUNTER — Other Ambulatory Visit: Payer: Self-pay | Admitting: *Deleted

## 2021-07-13 ENCOUNTER — Encounter: Payer: Self-pay | Admitting: *Deleted

## 2021-07-17 ENCOUNTER — Ambulatory Visit: Payer: Medicare Other | Admitting: Psychiatry

## 2021-07-19 ENCOUNTER — Encounter: Payer: Self-pay | Admitting: Psychiatry

## 2021-07-19 ENCOUNTER — Ambulatory Visit: Payer: Medicare Other | Admitting: Psychiatry

## 2021-07-19 VITALS — BP 142/84 | HR 67 | Ht 65.0 in | Wt 166.0 lb

## 2021-07-19 DIAGNOSIS — G44219 Episodic tension-type headache, not intractable: Secondary | ICD-10-CM

## 2021-07-19 NOTE — Progress Notes (Signed)
? ?Referring:  ?Suella Broad, MD ?[address] ?[address] ?[place],  [place] [address] ? ?PCP: ?Guerry Minors, FNP ? ?Neurology was asked to evaluate Amanda [name], a 76 year old female for a chief complaint of headaches.  Our recommendations of care will be communicated by shared medical record.   ? ?CC:  headaches ? ?History provided from self, daughters ? ?HPI:  ?Medical co-morbidities: pituitary mass, HTN, anxiety, cervical spondylosis s/p fusion, CAD, HLD, PAD, migraine ? ?The patient presents for evaluation of headaches which began about one year ago. She notes that her husband passed away in 06-21-2019 which caused increased stress. Thinks this may have contributed to the onset of her headaches. Headaches have not changed over time. She currently gets them 1-2 times per week. They are described as a dull ache in her vertex without associated photophobia, phonophobia, or nausea. They occur most frequently when she is lying in bed and can last up to one hour at a time. She does have a history of migraine several years ago, but her current headaches are not as severe as those were. Occasionally will see flashing lights in her vision which are not associated with a headache. She does note a whooshing sound when she is lying in bed at night. ? ?She gets light-headed frequently. Notes that she is not eating or drinking as frequently as she should be. ? ?She notes that she has been having an issue with her right eye, but cannot remember what the condition is. Last saw her eye doctor in February 2023. Per documentation she appears to have had atherosclerosis on her eye exam. ? ?MRI brain 03/2021 showed a 1 mm pituitary mass, 6 mm left frontal meningioma, and slit-like appearance of ventricles. She saw Neurosurgery who plans to repeat an MRI in 6 months to assess for stability of meningioma. States pituitary mass has been present for several years. ? ?Headache History: ?Onset: 1 year ago ?Triggers: none ?Aura:  none ?Location: vertex ?Quality/Description: dull aching ?Associated Symptoms: ? Photophobia: no ? Phonophobia: no ? Nausea: no ?Worse with activity?: no ?Duration of headaches: ~1 hour ? ?Headache days per month: 4 ?Headache free days per month: 26 ? ?Current Treatment: ?Abortive ?Tylenol ? ?Preventative ?none ? ?Prior Therapies                                 ?Hydrocodone ?Magnesium ?Sertraline 50 mg daily ?Gabapentin 300 mg TID ?Lisinopril - swelling ?Losartan 100 mg daily ? ? ?LABS: ?CBC ?   ?Component Value Date/Time  ? WBC 4.7 04/18/2021 1151  ? RBC 4.17 04/18/2021 1151  ? HGB 12.9 04/18/2021 1151  ? HGB 13.4 07/26/2020 1112  ? HCT 38.4 04/18/2021 1151  ? HCT 39.8 07/26/2020 1112  ? PLT 244.0 04/18/2021 1151  ? PLT 262 07/26/2020 1112  ? MCV 92.3 04/18/2021 1151  ? MCV 96 07/26/2020 1112  ? MCH 32.2 08/02/2020 0229  ? MCHC 33.7 04/18/2021 1151  ? RDW 13.0 04/18/2021 1151  ? RDW 12.7 07/26/2020 1112  ? LYMPHSABS 1.6 04/18/2021 1151  ? MONOABS 0.4 04/18/2021 1151  ? EOSABS 0.2 04/18/2021 1151  ? BASOSABS 0.0 04/18/2021 1151  ? ? ?  Latest Ref Rng & Units 03/29/2021  ? 11:23 AM 12/15/2020  ?  9:35 AM 08/02/2020  ?  2:29 AM  ?CMP  ?Glucose 70 - 99 mg/dL 91    99    ?BUN 8 - 27 mg/dL  8    12    ?Creatinine 0.57 - 1.00 mg/dL 0.91    0.78    ?Sodium 134 - 144 mmol/L 137    140    ?Potassium 3.5 - 5.2 mmol/L 3.8    3.4    ?Chloride 96 - 106 mmol/L 99    107    ?CO2 20 - 29 mmol/L 27    28    ?Calcium 8.7 - 10.3 mg/dL 9.0    8.3    ?Total Protein 6.0 - 8.5 g/dL 6.5   6.2     ?Total Bilirubin 0.0 - 1.2 mg/dL 0.3   0.3     ?Alkaline Phos 44 - 121 IU/L 84   81     ?AST 0 - 40 IU/L 19   18     ?ALT 0 - 32 IU/L 6   8     ? ? ? ?IMAGING:  ?MRI brain 03/2021: ?-1 mm hypoenhancing focus in the left pituitary that may represent pituitary microadenoma ?-6x5x6 mm enhancing dural based mass in left middle frontal convexity most compatible with benign meningioma ?-Slit like ventricles and prominence of subarachnoid spaces ? ?Imaging  independently reviewed on Jul 19, 2021  ? ?Current Outpatient Medications on File Prior to Visit  ?Medication Sig Dispense Refill  ? AMBULATORY NON FORMULARY MEDICATION TAM herbal laxative ?Take 2 tablet by mouth every other day as needed    ? Cholecalciferol (VITAMIN D PO) Take 2,000 Units by mouth daily.    ? Cholecalciferol (VITAMIN D3) 50 MCG (2000 UT) TABS Take 2 tablets by mouth daily.    ? clopidogrel (PLAVIX) 75 MG tablet Take 1 tablet by mouth daily.    ? hydrochlorothiazide (HYDRODIURIL) 25 MG tablet Take 1 tablet (25 mg total) by mouth daily. 90 tablet 0  ? HYDROcodone-acetaminophen (NORCO) 10-325 MG tablet Take 1 tablet by mouth every 6 (six) hours as needed (Back pain).    ? LORazepam (ATIVAN) 0.5 MG tablet Take 0.5 mg by mouth daily as needed for anxiety.    ? losartan (COZAAR) 100 MG tablet Take 1 tablet (100 mg total) by mouth at bedtime. 90 tablet 3  ? pantoprazole (PROTONIX) 40 MG tablet Take 1 tablet by mouth daily.    ? sertraline (ZOLOFT) 50 MG tablet Take 1 tablet by mouth daily.    ? diclofenac Sodium (VOLTAREN) 1 % GEL Apply 2-4 g topically as needed. (Patient not taking: Reported on 07/19/2021)    ? diphenhydrAMINE HCl, Sleep, (SLEEP AID) 50 MG CAPS Take 50 mg by mouth at bedtime. (Patient not taking: Reported on 07/19/2021)    ? estradiol (ESTRACE) 0.1 MG/GM vaginal cream Place 1 Applicatorful vaginally at bedtime. (Patient not taking: Reported on 07/19/2021)    ? Evolocumab with Infusor ([place]) 420 MG/3.5ML SOCT every 30 (thirty) days. (Patient not taking: Reported on 07/19/2021)    ? fluticasone (FLONASE) 50 MCG/ACT nasal spray Place 1 spray into both nostrils daily as needed for allergies. (Patient not taking: Reported on 07/19/2021)    ? ?Current Facility-Administered Medications on File Prior to Visit  ?Medication Dose Route Frequency Provider Last Rate Last Admin  ? sodium chloride flush (NS) 0.9 % injection 3 mL  3 mL Intravenous Q12H Lorretta Harp, MD       ? ? ? ?Allergies: ?Allergies  ?Allergen Reactions  ? Ciprofloxacin Rash  ? Lisinopril Swelling  ? Pneumococcal Vaccine Swelling  ? Pravastatin Other (See Comments)  ?  Muscle aches ?  ?  Crestor [Rosuvastatin]   ?  myalgias  ? Lipitor [Atorvastatin]   ?  myalgias  ? Norvasc [Amlodipine]   ?  swelling  ? Zetia [Ezetimibe]   ?  "felt horrible"  ? ? ?Family History: ?Family History  ?Problem Relation Age of Onset  ? Heart disease Mother   ? Hypertension Mother   ? Stroke Mother   ? Colon polyps Father   ? Heart disease Father   ?     CHF  ? Hypertension Father   ? Cancer Father   ?     COLON  ? Colon cancer Father   ? Colon cancer Sister   ? Colon polyps Sister   ? Heart disease Sister   ? Diabetes Sister   ? Melanoma Sister   ? Heart disease Sister   ? Heart disease Sister   ? Colon polyps Brother   ? Diabetes Brother   ? Heart disease Brother   ? Cancer Brother   ?     COLON  ? Melanoma Brother   ? Colon cancer Brother   ? Melanoma Brother   ? Skin cancer Brother   ? Skin cancer Brother   ? Prostate cancer Brother   ? Skin cancer Daughter   ? Hypertension Daughter   ? Skin cancer Daughter   ? Migraines Daughter   ? Skin cancer Son   ? Esophageal cancer Neg Hx   ? Rectal cancer Neg Hx   ? Stomach cancer Neg Hx   ? ? ? ?Past Medical History: ?Past Medical History:  ?Diagnosis Date  ? Anxiety   ? Arthritis   ? Back pain   ? CAD (coronary artery disease)   ? a. 01/2014: cath showing 70% stenosis of non-dominant RCA --> medically managed.   ? Cataract   ? bilat removed  ? DDD (degenerative disc disease), cervical   ? DDD (degenerative disc disease), lumbar   ? Depression   ? Diverticulosis   ? GERD (gastroesophageal reflux disease)   ? Headache   ? Hemorrhoids   ? Hiatal hernia   ? Hyperlipidemia   ? Hypertension   ? Melanoma ([place])   ? Orbital fracture ([place])   ? right  ? Osteopenia 07/2015  ? T score -2.0 FRAX 10%/1.4%  ? Osteoporosis 08/29/2015  ? Pituitary tumor   ? Stricture of esophagus   ? Vitamin D deficiency    ? ? ?Past Surgical History ?Past Surgical History:  ?Procedure Laterality Date  ? ABDOMINAL AORTOGRAM W/LOWER EXTREMITY N/A 08/01/2020  ? Procedure: ABDOMINAL AORTOGRAM W/LOWER EXTREMITY;  Surgeon: Lorretta Harp, MD;  Location

## 2021-07-19 NOTE — Patient Instructions (Signed)
Please have eye doctor send most recent notes to our office ?

## 2021-07-22 ENCOUNTER — Other Ambulatory Visit: Payer: Self-pay | Admitting: Interventional Cardiology

## 2021-07-25 ENCOUNTER — Other Ambulatory Visit: Payer: Self-pay

## 2021-07-25 ENCOUNTER — Ambulatory Visit: Payer: Medicare Other | Admitting: Physician Assistant

## 2021-07-25 ENCOUNTER — Encounter: Payer: Self-pay | Admitting: Physician Assistant

## 2021-07-25 VITALS — BP 120/70 | HR 64 | Ht 65.0 in | Wt 162.0 lb

## 2021-07-25 DIAGNOSIS — I2510 Atherosclerotic heart disease of native coronary artery without angina pectoris: Secondary | ICD-10-CM | POA: Diagnosis not present

## 2021-07-25 DIAGNOSIS — M545 Low back pain, unspecified: Secondary | ICD-10-CM

## 2021-07-25 DIAGNOSIS — I10 Essential (primary) hypertension: Secondary | ICD-10-CM | POA: Diagnosis not present

## 2021-07-25 DIAGNOSIS — I739 Peripheral vascular disease, unspecified: Secondary | ICD-10-CM | POA: Diagnosis not present

## 2021-07-25 DIAGNOSIS — G8929 Other chronic pain: Secondary | ICD-10-CM

## 2021-07-25 DIAGNOSIS — M25512 Pain in left shoulder: Secondary | ICD-10-CM

## 2021-07-25 DIAGNOSIS — E785 Hyperlipidemia, unspecified: Secondary | ICD-10-CM | POA: Diagnosis not present

## 2021-07-25 MED ORDER — REPATHA PUSHTRONEX SYSTEM 420 MG/3.5ML ~~LOC~~ SOCT: 420.0000 mg | SUBCUTANEOUS | 11 refills | Status: DC

## 2021-07-25 NOTE — Patient Instructions (Signed)
Medication Instructions:   ?Stop Plavix  ?Start Aspirin 81 mg daily  ?Restart Repatha  ? ?*If you need a refill on your cardiac medications before your next appointment, please call your pharmacy* ? ? ?Lab Work: ?None ordered  ? ?If you have labs (blood work) drawn today and your tests are completely normal, you will receive your results only by: ?MyChart Message (if you have MyChart) OR ?A paper copy in the mail ?If you have any lab test that is abnormal or we need to change your treatment, we will call you to review the results. ? ? ?Testing/Procedures: ?None ordered ? ? ?Follow-Up: ?At [facility], you and your health needs are our priority.  As part of our continuing mission to provide you with exceptional heart care, we have created designated Provider Care Teams.  These Care Teams include your primary Cardiologist (physician) and Advanced Practice Providers (APPs -  Physician Assistants and Nurse Practitioners) who all work together to provide you with the care you need, when you need it. ? ?We recommend signing up for the patient portal called "MyChart".  Sign up information is provided on this After Visit Summary.  MyChart is used to connect with patients for Virtual Visits (Telemedicine).  Patients are able to view lab/test results, encounter notes, upcoming appointments, etc.  Non-urgent messages can be sent to your provider as well.   ?To learn more about what you can do with MyChart, go to [email or web address].   ? ?Your next appointment:   ?6 month(s) ? ?The format for your next appointment:   ?In Person ? ?Provider:   ?Freada Bergeron, MD   ? ? ?Other Instructions ? ?Important Information About Sugar ? ? ? ? ?  ?

## 2021-07-25 NOTE — Progress Notes (Signed)
?Cardiology Office Note:   ? ?Date:  07/25/2021  ? ?ID:  Amanda [name], DOB February 11, 1946, MRN [id number] ? ?PCP:  Guerry Minors, FNP  ?[facility] Cardiologist:  Freada Bergeron, MD  ?[facility] Electrophysiologist:  None  ?PV: Dr. Gwenlyn Found  ? ?Chief Complaint: L sided shoulder pain  ? ?History of Present Illness:   ? ?Amanda [name] is a 76 y.o. female with a hx of CAD, PVD, hypertension, hyperlipidemia, GERD, and chronic back pain seen for L shoulder pain.  ? ?Patient underwent stress echocardiogram October 2015 for chest pain found to have electocardiographic changes and possible anteroapical wall motion abnormality. Cardiac catheterization November 2015 showed a 70% small nondominant right coronary artery and an ejection fraction of 55-65%. Medical therapy recommended. Nuclear study December 2019 showed ejection fraction 62% poor quality study question small apical infarct and mild inferobasilar ischemia.  Coronary CTA January 2020 showed calcium score 53 and mild stenosis proximal LAD.  Treated medically. ? ?She had peripheral angiography on 08/01/20 revealing high-grade segmental mid left SFA stenosis with two-vessel runoff, 50% mid right SFA stenosis. Directional atherectomy was performed followed by drug-coated balloon angioplasty with excellent result. Dopplers on 02/21/21 revealed normal ABIs bilaterally with a widely patent left SFA. Recommendation to repeat doppler studies in 1 year.  ? ?Echo 03/2021 with LVEF of 60-65% and grade 1 DD.  ? ?Last week patient had sudden episode of lower back pain. Felt sharp. She was diaphoretic. No SOB or chest pain. She was preparing meal and suddenly had back pain while turning.  Symptoms lasted for 3 minutes.  No associated palpitation.  She took hydrocodone with immediate relief.  Blood pressure and heart rate were normal.  Patient is also reporting left shoulder pain with numbness and tingling in left arm.  She never had substernal chest pressure or tightness.  No  regular exercise but does gardening and walking to mailbox.  She reports severe back pain after grocery shopping.  Prior history of neck surgery x2.  Orthopedic doctor told her that she has severe neck issue. ? ? ?Past Medical History:  ?Diagnosis Date  ? Anxiety   ? Arthritis   ? Back pain   ? CAD (coronary artery disease)   ? a. 01/2014: cath showing 70% stenosis of non-dominant RCA --> medically managed.   ? Cataract   ? bilat removed  ? DDD (degenerative disc disease), cervical   ? DDD (degenerative disc disease), lumbar   ? Depression   ? Diverticulosis   ? GERD (gastroesophageal reflux disease)   ? Headache   ? Hemorrhoids   ? Hiatal hernia   ? Hyperlipidemia   ? Hypertension   ? Melanoma ([place])   ? Orbital fracture ([place])   ? right  ? Osteopenia 07/2015  ? T score -2.0 FRAX 10%/1.4%  ? Osteoporosis 08/29/2015  ? Pituitary tumor   ? Stricture of esophagus   ? Vitamin D deficiency   ? ? ?Past Surgical History:  ?Procedure Laterality Date  ? ABDOMINAL AORTOGRAM W/LOWER EXTREMITY N/A 08/01/2020  ? Procedure: ABDOMINAL AORTOGRAM W/LOWER EXTREMITY;  Surgeon: Lorretta Harp, MD;  Location: [facility] CV [facility];  Service: Cardiovascular;  Laterality: N/A;  ? CERVICAL DISC SURGERY    ? x 2  ? COLONOSCOPY    ? EXCISION OF SKIN CANCER    ? Melanoma  ? FOOT SURGERY Right   ? HEMORRHOID BANDING    ? X3  ? LEFT HEART CATHETERIZATION WITH CORONARY ANGIOGRAM N/A 02/03/2014  ?  Procedure: LEFT HEART CATHETERIZATION WITH CORONARY ANGIOGRAM;  Surgeon: [name], MD;  Location: [facility] CATH LAB;  Service: Cardiovascular;  Laterality: N/A;  ? [place] SURGERY  2018  ? OOPHORECTOMY  2012  ? BSO  ? PELVIC LAPAROSCOPY  2012  ? Diag Lap-BSO-lysis of adhesions  ? PERIPHERAL VASCULAR INTERVENTION Left 08/01/2020  ? Procedure: PERIPHERAL VASCULAR INTERVENTION;  Surgeon: Lorretta Harp, MD;  Location: [place] CV LAB;  Service: Cardiovascular;  Laterality: Left;  ? TUBAL LIGATION    ? VAGINAL HYSTERECTOMY  1993  ? ? ?Current  Medications: ?Current Meds  ?Medication Sig  ? AMBULATORY NON FORMULARY MEDICATION TAM herbal laxative ?Take 2 tablet by mouth every other day as needed  ? aspirin EC 81 MG tablet Take 81 mg by mouth daily. Swallow whole.  ? Cholecalciferol (VITAMIN D PO) Take 2,000 Units by mouth daily.  ? Cholecalciferol (VITAMIN D3) 50 MCG (2000 UT) TABS Take 2 tablets by mouth daily.  ? diclofenac Sodium (VOLTAREN) 1 % GEL Apply 2-4 g topically as needed.  ? diphenhydrAMINE HCl, Sleep, (SLEEP AID) 50 MG CAPS Take 50 mg by mouth at bedtime.  ? estradiol (ESTRACE) 0.1 MG/GM vaginal cream Place 1 Applicatorful vaginally at bedtime.  ? Evolocumab with Infusor ([place]) 420 MG/3.5ML SOCT Inject into the skin every 30 (thirty) days.  ? fluticasone (FLONASE) 50 MCG/ACT nasal spray Place 1 spray into both nostrils daily as needed for allergies.  ? hydrochlorothiazide (HYDRODIURIL) 25 MG tablet Take 1 tablet (25 mg total) by mouth daily.  ? HYDROcodone-acetaminophen (NORCO) 10-325 MG tablet Take 1 tablet by mouth every 6 (six) hours as needed (Back pain).  ? LORazepam (ATIVAN) 0.5 MG tablet Take 0.5 mg by mouth daily as needed for anxiety.  ? losartan (COZAAR) 100 MG tablet Take 1 tablet (100 mg total) by mouth at bedtime.  ? pantoprazole (PROTONIX) 40 MG tablet Take 1 tablet by mouth daily.  ? sertraline (ZOLOFT) 50 MG tablet Take 1 tablet by mouth daily.  ? [DISCONTINUED] clopidogrel (PLAVIX) 75 MG tablet TAKE ONE TABLET ('75mg'$ ) BY MOUTH DAILY with breakfast  ? [DISCONTINUED] Evolocumab with Infusor ([place]) 420 MG/3.5ML SOCT every 30 (thirty) days.  ? [DISCONTINUED] Evolocumab with Infusor ([place]) 420 MG/3.5ML SOCT Inject into the skin.  ? ?Current Facility-Administered Medications for the 07/25/21 encounter (Office Visit) with Leanor Kail, [place]  ?Medication  ? sodium chloride flush (NS) 0.9 % injection 3 mL  ?  ? ?Allergies:   Ciprofloxacin, Lisinopril, Pneumococcal vaccine,  Pravastatin, Crestor [rosuvastatin], Lipitor [atorvastatin], Norvasc [amlodipine], and Zetia [ezetimibe]  ? ?Social History  ? ?Socioeconomic History  ? Marital status: Widowed  ?  Spouse name: Not on file  ? Number of children: 3  ? Years of education: Not on file  ? Highest education level: 10th grade  ?Occupational History  ? Occupation: Disabled/retired  ?Tobacco Use  ? Smoking status: Former  ?  Years: 20.00  ?  Types: Cigarettes  ?  Quit date: 03/19/1994  ?  Years since quitting: 27.3  ? Smokeless tobacco: Never  ?Vaping Use  ? Vaping Use: Never used  ?Substance and Sexual Activity  ? Alcohol use: No  ?  Alcohol/week: 0.0 standard drinks  ? Drug use: No  ? Sexual activity: Not Currently  ?  Birth control/protection: Surgical  ?  Comment: 1st intercourse 76 yo-Fewer than 5 partners,des neg  ?Other Topics Concern  ? Not on file  ?Social History Narrative  ?  Lives with daughter   ? Some caffeine   ? ?Social Determinants of Health  ? ?Financial Resource Strain: Not on file  ?Food Insecurity: Not on file  ?Transportation Needs: Not on file  ?Physical Activity: Not on file  ?Stress: Not on file  ?Social Connections: Not on file  ?  ? ?Family History: ?The patient's family history includes Cancer in her brother and father; Colon cancer in her brother, father, and sister; Colon polyps in her brother, father, and sister; Diabetes in her brother and sister; Heart disease in her brother, father, mother, sister, sister, and sister; Hypertension in her daughter, father, and mother; Melanoma in her brother, brother, and sister; Migraines in her daughter; Prostate cancer in her brother; Skin cancer in her brother, brother, daughter, daughter, and son; Stroke in her mother. There is no history of Esophageal cancer, Rectal cancer, or Stomach cancer.   ? ?ROS:   ?Please see the history of present illness.    ?All other systems reviewed and are negative.  ? ?EKGs/Labs/Other Studies Reviewed:   ? ?The following studies were  reviewed today: ? ?As above ?Echo 03/2021 ? 1. Left ventricular ejection fraction, by estimation, is 60 to 65%. The  ?left ventricle has normal function. The left ventricle has no regional  ?wall motion abnorm

## 2021-07-26 DIAGNOSIS — N3946 Mixed incontinence: Secondary | ICD-10-CM | POA: Diagnosis not present

## 2021-08-09 DIAGNOSIS — D497 Neoplasm of unspecified behavior of endocrine glands and other parts of nervous system: Secondary | ICD-10-CM | POA: Diagnosis not present

## 2021-08-09 DIAGNOSIS — H16223 Keratoconjunctivitis sicca, not specified as Sjogren's, bilateral: Secondary | ICD-10-CM | POA: Diagnosis not present

## 2021-08-22 DIAGNOSIS — M5412 Radiculopathy, cervical region: Secondary | ICD-10-CM | POA: Diagnosis not present

## 2021-08-31 ENCOUNTER — Other Ambulatory Visit: Payer: Self-pay | Admitting: Cardiovascular Disease

## 2021-08-31 DIAGNOSIS — M25512 Pain in left shoulder: Secondary | ICD-10-CM | POA: Insufficient documentation

## 2021-08-31 DIAGNOSIS — E785 Hyperlipidemia, unspecified: Secondary | ICD-10-CM

## 2021-08-31 DIAGNOSIS — I2510 Atherosclerotic heart disease of native coronary artery without angina pectoris: Secondary | ICD-10-CM

## 2021-08-31 HISTORY — DX: Pain in left shoulder: M25.512

## 2021-09-05 DIAGNOSIS — M5416 Radiculopathy, lumbar region: Secondary | ICD-10-CM | POA: Diagnosis not present

## 2021-09-08 ENCOUNTER — Ambulatory Visit: Payer: Medicare Other | Admitting: Cardiology

## 2021-09-08 DIAGNOSIS — D489 Neoplasm of uncertain behavior, unspecified: Secondary | ICD-10-CM | POA: Diagnosis not present

## 2021-09-08 DIAGNOSIS — D235 Other benign neoplasm of skin of trunk: Secondary | ICD-10-CM | POA: Diagnosis not present

## 2021-09-08 DIAGNOSIS — Z808 Family history of malignant neoplasm of other organs or systems: Secondary | ICD-10-CM | POA: Diagnosis not present

## 2021-09-08 DIAGNOSIS — Z85828 Personal history of other malignant neoplasm of skin: Secondary | ICD-10-CM | POA: Diagnosis not present

## 2021-09-08 DIAGNOSIS — D229 Melanocytic nevi, unspecified: Secondary | ICD-10-CM | POA: Diagnosis not present

## 2021-09-08 DIAGNOSIS — B079 Viral wart, unspecified: Secondary | ICD-10-CM | POA: Diagnosis not present

## 2021-09-08 DIAGNOSIS — L821 Other seborrheic keratosis: Secondary | ICD-10-CM | POA: Diagnosis not present

## 2021-09-23 ENCOUNTER — Other Ambulatory Visit: Payer: Self-pay | Admitting: Interventional Cardiology

## 2021-10-06 ENCOUNTER — Other Ambulatory Visit: Payer: Self-pay | Admitting: Internal Medicine

## 2021-10-06 DIAGNOSIS — Z1231 Encounter for screening mammogram for malignant neoplasm of breast: Secondary | ICD-10-CM

## 2021-10-10 DIAGNOSIS — M5412 Radiculopathy, cervical region: Secondary | ICD-10-CM | POA: Diagnosis not present

## 2021-10-10 DIAGNOSIS — M5030 Other cervical disc degeneration, unspecified cervical region: Secondary | ICD-10-CM | POA: Diagnosis not present

## 2021-10-10 DIAGNOSIS — M5136 Other intervertebral disc degeneration, lumbar region: Secondary | ICD-10-CM | POA: Diagnosis not present

## 2021-10-10 DIAGNOSIS — M961 Postlaminectomy syndrome, not elsewhere classified: Secondary | ICD-10-CM | POA: Diagnosis not present

## 2021-10-24 ENCOUNTER — Other Ambulatory Visit: Payer: Self-pay

## 2021-10-24 MED ORDER — HYDROCHLOROTHIAZIDE 25 MG PO TABS: 25.0000 mg | ORAL_TABLET | Freq: Every day | ORAL | 2 refills | Status: DC

## 2021-11-07 ENCOUNTER — Other Ambulatory Visit: Payer: Self-pay | Admitting: Internal Medicine

## 2021-11-07 DIAGNOSIS — Z1231 Encounter for screening mammogram for malignant neoplasm of breast: Secondary | ICD-10-CM

## 2021-11-09 ENCOUNTER — Ambulatory Visit
Admission: RE | Admit: 2021-11-09 | Discharge: 2021-11-09 | Disposition: A | Discharge status: Home / Self Care | Payer: Medicare Other | Source: Ambulatory Visit | Attending: Internal Medicine | Admitting: Internal Medicine

## 2021-11-09 DIAGNOSIS — Z1231 Encounter for screening mammogram for malignant neoplasm of breast: Secondary | ICD-10-CM | POA: Diagnosis not present

## 2021-11-13 ENCOUNTER — Ambulatory Visit: Payer: Medicare Other

## 2021-12-05 DIAGNOSIS — M19011 Primary osteoarthritis, right shoulder: Secondary | ICD-10-CM | POA: Diagnosis not present

## 2021-12-05 DIAGNOSIS — M722 Plantar fascial fibromatosis: Secondary | ICD-10-CM | POA: Diagnosis not present

## 2021-12-07 ENCOUNTER — Other Ambulatory Visit: Payer: Self-pay

## 2021-12-07 ENCOUNTER — Emergency Department (HOSPITAL_COMMUNITY): Payer: Medicare Other

## 2021-12-07 ENCOUNTER — Emergency Department (HOSPITAL_COMMUNITY)
Admission: EM | Admit: 2021-12-07 | Discharge: 2021-12-07 | Disposition: A | Discharge status: Home / Self Care | Payer: Medicare Other | Attending: Emergency Medicine | Admitting: Emergency Medicine

## 2021-12-07 DIAGNOSIS — R519 Headache, unspecified: Secondary | ICD-10-CM | POA: Diagnosis present

## 2021-12-07 DIAGNOSIS — Z79899 Other long term (current) drug therapy: Secondary | ICD-10-CM | POA: Diagnosis not present

## 2021-12-07 DIAGNOSIS — I10 Essential (primary) hypertension: Secondary | ICD-10-CM | POA: Diagnosis not present

## 2021-12-07 DIAGNOSIS — Z7982 Long term (current) use of aspirin: Secondary | ICD-10-CM | POA: Insufficient documentation

## 2021-12-07 DIAGNOSIS — R079 Chest pain, unspecified: Secondary | ICD-10-CM | POA: Diagnosis not present

## 2021-12-07 DIAGNOSIS — R0602 Shortness of breath: Secondary | ICD-10-CM | POA: Diagnosis not present

## 2021-12-07 LAB — CBC
HCT: 39.3 % (ref 36.0–46.0)
Hemoglobin: 13.5 g/dL (ref 12.0–15.0)
MCH: 32.2 pg (ref 26.0–34.0)
MCHC: 34.4 g/dL (ref 30.0–36.0)
MCV: 93.8 fL (ref 80.0–100.0)
Platelets: 254 10*3/uL (ref 150–400)
RBC: 4.19 MIL/uL (ref 3.87–5.11)
RDW: 12 % (ref 11.5–15.5)
WBC: 5 10*3/uL (ref 4.0–10.5)
nRBC: 0 % (ref 0.0–0.2)

## 2021-12-07 LAB — BASIC METABOLIC PANEL
Anion gap: 9 (ref 5–15)
BUN: 13 mg/dL (ref 8–23)
CO2: 26 mmol/L (ref 22–32)
Calcium: 9.5 mg/dL (ref 8.9–10.3)
Chloride: 102 mmol/L (ref 98–111)
Creatinine, Ser: 0.92 mg/dL (ref 0.44–1.00)
GFR, Estimated: >60 mL/min (ref >60)
Glucose, Bld: 113 mg/dL — ABNORMAL HIGH (ref 70–99)
Potassium: 3.3 mmol/L — ABNORMAL LOW (ref 3.5–5.1)
Sodium: 137 mmol/L (ref 135–145)

## 2021-12-07 LAB — TROPONIN I (HIGH SENSITIVITY)
Troponin I (High Sensitivity): 6 ng/L (ref <18)
Troponin I (High Sensitivity): 6 ng/L (ref <18)

## 2021-12-07 MED ORDER — ACETAMINOPHEN 500 MG PO TABS
1000.0000 mg | ORAL_TABLET | Freq: Four times a day (QID) | ORAL | Status: DC | PRN
  Administered 2021-12-07: 1000 mg via ORAL
  Filled 2021-12-07: qty 2

## 2021-12-07 MED ORDER — HYDROCHLOROTHIAZIDE 25 MG PO TABS
25.0000 mg | ORAL_TABLET | Freq: Every day | ORAL | Status: DC
  Administered 2021-12-07: 25 mg via ORAL
  Filled 2021-12-07: qty 1

## 2021-12-07 NOTE — ED Provider Triage Note (Signed)
Emergency Medicine Provider Triage Evaluation Note  Amanda [name] , a 76 y.o. female  was evaluated in triage.  Pt complains of chest pain, shortness of breath, high blood pressure and headache.  Patient reports that the symptoms began 2 days ago after receiving a steroid injection into her right shoulder.  Patient reports that since this time she has had chest tightness, shortness of breath and her blood pressure has been unable to be controlled utilizing her home HCTZ and Cozaar.  Patient states that yesterday she had a headache, denies headache presently.  The patient reports that her chest pain and shortness of breath are worsened with exertion.  The patient denies any fevers, nausea, vomiting, sore throat, cough.  Patient was complaining of increased fatigue, patient daughter states that she tested her at home yesterday for COVID which was negative.  Review of Systems  Positive:  Negative:   Physical Exam  BP (!) 172/98 (BP Location: Right Arm)   Pulse 84   Temp 98.7 F (37.1 C) (Oral)   Resp 16   SpO2 97%  Gen:   Awake, no distress   Resp:  Normal effort  MSK:   Moves extremities without difficulty  Other:    Medical Decision Making  Medically screening exam initiated at 10:07 AM.  Appropriate orders placed.  Amanda [name] was informed that the remainder of the evaluation will be completed by another provider, this initial triage assessment does not replace that evaluation, and the importance of remaining in the ED until their evaluation is complete.     Amanda Cecil, PA-C 12/07/21 1008

## 2021-12-07 NOTE — ED Provider Notes (Signed)
I saw and evaluated the patient, reviewed the resident's note and I agree with the findings and plan.  EKG Interpretation  Date/Time:  Thursday December 07 2021 09:43:10 EDT Ventricular Rate:  80 PR Interval:  180 QRS Duration: 80 QT Interval:  376 QTC Calculation: 433 R Axis:   -4 Text Interpretation: Sinus rhythm with occasional Premature ventricular complexes Minimal voltage criteria for LVH, may be normal variant ( R in aVL ) Abnormal ECG When compared with ECG of 02-Aug-2020 07:41, PREVIOUS ECG IS PRESENT No sig change from prior tracing Confirmed by Octaviano Glow [id number] on 12/07/2021 2:08:99 PM    76 year old patient presents with increased blood pressure.  Did not take her blood pressure medications morning.  Did have a recent steroid injection to her right shoulder 24 hours ago.  Patient's blood pressure slightly elevated here and given oral medications.  Patient also states that she has had intermittent atypical chest pain for some time.  Patient troponins here are negative.  Given her home meds for blood pressure here and blood pressure improved.  She has low risk for ACS.  Will discharge with return precautions   Lacretia Leigh, MD 12/07/21 [phone]

## 2021-12-07 NOTE — ED Provider Notes (Signed)
[facility] EMERGENCY DEPARTMENT Provider Note   CSN: [id number] Arrival date & time: 12/07/21  0930     History  Chief Complaint  Patient presents with   Hypertension   Shortness of Breath   Chest Pain   Fatigue    Amanda [name] is a 76 y.o. female. Presenting 2 days after receiving a cortisone shot in her right shoulder for musculoskeletal pain.  She has a history of hypertension and takes 2 blood pressure medications.  She is continue to take these, but her blood pressure is running higher than usual.  It has been in the 150s to 170 range, and it is normally around 130-140.  She has also felt more fatigued and had intermittent headaches.  Her headache is dull and achy and located on the right side.  It feels like her normal occasional headaches, but is just occurring more frequently than usual.  She has not taken anything for it.  She denies any vision changes.  She reports chest tightness and feeling like she has difficulty catching her breath that is worse on exertion.  She has felt the symptom for multiple months.  It has not worsened or changed.  She follows with cardiology. History of prior lower extremity DVT/PE stent, not currently on anticoagulation. Currently she is not experiencing chest tightness or shortness of breath.  She is experiencing a mild headache. She has not taken any of her blood pressure medications today.  Hypertension Associated symptoms include headaches. Pertinent negatives include no chest pain, no abdominal pain and no shortness of breath.  Shortness of Breath Associated symptoms: headaches   Associated symptoms: no abdominal pain, no chest pain, no cough, no ear pain, no fever, no rash, no sore throat and no vomiting   Chest Pain Associated symptoms: headache   Associated symptoms: no abdominal pain, no back pain, no cough, no fever, no palpitations, no shortness of breath and no vomiting        Home Medications Prior to  Admission medications   Medication Sig Start Date End Date Taking? Authorizing Provider  AMBULATORY NON FORMULARY MEDICATION TAM herbal laxative Take 2 tablet by mouth every other day as needed    [provider]  aspirin EC 81 MG tablet Take 81 mg by mouth daily. Swallow whole.    [provider]  Cholecalciferol (VITAMIN D PO) Take 2,000 Units by mouth daily.    [provider]  Cholecalciferol (VITAMIN D3) 50 MCG (2000 UT) TABS Take 2 tablets by mouth daily.    [provider]  diclofenac Sodium (VOLTAREN) 1 % GEL Apply 2-4 g topically as needed.    [provider]  diphenhydrAMINE HCl, Sleep, (SLEEP AID) 50 MG CAPS Take 50 mg by mouth at bedtime.    [provider]  estradiol (ESTRACE) 0.1 MG/GM vaginal cream Place 1 Applicatorful vaginally at bedtime.    [provider]  Evolocumab with Infusor ([place]) 420 MG/3.5ML SOCT INJECT '420MG'$  UNDER THE SKIN EVERY 30 DAYS 09/01/21   Lorretta Harp, MD  fluticasone [facility]) 50 MCG/ACT nasal spray Place 1 spray into both nostrils daily as needed for allergies. 05/31/20   [provider]  hydrochlorothiazide (HYDRODIURIL) 25 MG tablet Take 1 tablet (25 mg total) by mouth daily. 10/24/21   Freada Bergeron, MD  HYDROcodone-acetaminophen (NORCO) 10-325 MG tablet Take 1 tablet by mouth every 6 (six) hours as needed (Back pain).    [provider]  LORazepam (ATIVAN) 0.5  MG tablet Take 0.5 mg by mouth daily as needed for anxiety. 02/03/20   [provider]  losartan (COZAAR) 100 MG tablet Take 1 tablet (100 mg total) by mouth at bedtime. 06/14/21   Freada Bergeron, MD  pantoprazole (PROTONIX) 40 MG tablet TAKE ONE TABLET BY MOUTH EVERY DAY 09/25/21   Freada Bergeron, MD  sertraline (ZOLOFT) 50 MG tablet Take 1 tablet by mouth daily. 05/03/21   [provider]      Allergies    Ciprofloxacin, Lisinopril, Pneumococcal vaccine,  Pravastatin, Crestor [rosuvastatin], Lipitor [atorvastatin], Norvasc [amlodipine], and Zetia [ezetimibe]    Review of Systems   Review of Systems  Constitutional:  Negative for chills and fever.  HENT:  Negative for ear pain and sore throat.   Eyes:  Negative for pain and visual disturbance.  Respiratory:  Negative for cough and shortness of breath.   Cardiovascular:  Negative for chest pain and palpitations.  Gastrointestinal:  Negative for abdominal pain and vomiting.  Genitourinary:  Negative for dysuria and hematuria.  Musculoskeletal:  Negative for arthralgias and back pain.  Skin:  Negative for color change and rash.  Neurological:  Positive for headaches. Negative for seizures and syncope.  All other systems reviewed and are negative.   Physical Exam Updated Vital Signs BP (!) 156/70   Pulse 65   Temp 98.2 F (36.8 C) (Oral)   Resp 14   Ht '5\' 5"'$  (1.651 m)   Wt 75.3 kg   SpO2 97%   BMI 27.62 kg/m  Physical Exam Vitals and nursing note reviewed.  Constitutional:      General: She is not in acute distress.    Appearance: She is well-developed.  HENT:     Head: Normocephalic and atraumatic.  Eyes:     Conjunctiva/sclera: Conjunctivae normal.  Cardiovascular:     Rate and Rhythm: Normal rate and regular rhythm.     Heart sounds: No murmur heard. Pulmonary:     Effort: Pulmonary effort is normal. No respiratory distress.     Breath sounds: Normal breath sounds.  Abdominal:     Palpations: Abdomen is soft.     Tenderness: There is no abdominal tenderness.  Musculoskeletal:        General: No swelling.     Cervical back: Neck supple.     Right lower leg: No edema.     Left lower leg: No edema.  Skin:    General: Skin is warm and dry.     Capillary Refill: Capillary refill takes less than 2 seconds.  Neurological:     Mental Status: She is alert.     GCS: GCS eye subscore is 4. GCS verbal subscore is 5. GCS motor subscore is 6.     Cranial Nerves: Cranial  nerves 2-12 are intact.     Sensory: Sensation is intact.     Motor: Motor function is intact.     Coordination: Coordination is intact.  Psychiatric:        Mood and Affect: Mood normal.     ED Results / Procedures / Treatments   Labs (all labs ordered are listed, but only abnormal results are displayed) Labs Reviewed  BASIC METABOLIC PANEL - Abnormal; Notable for the following components:      Result Value   Potassium 3.3 (*)    Glucose, Bld 113 (*)    All other components within normal limits  CBC  TROPONIN I (HIGH SENSITIVITY)  TROPONIN I (HIGH SENSITIVITY)  EKG EKG Interpretation  Date/Time:  Thursday December 07 2021 09:43:10 EDT Ventricular Rate:  80 PR Interval:  180 QRS Duration: 80 QT Interval:  376 QTC Calculation: 433 R Axis:   -4 Text Interpretation: Sinus rhythm with occasional Premature ventricular complexes Minimal voltage criteria for LVH, may be normal variant ( R in aVL ) Abnormal ECG When compared with ECG of 02-Aug-2020 07:41, PREVIOUS ECG IS PRESENT No sig change from prior tracing Confirmed by Octaviano Glow [phone]) on 12/07/2021 2:08:16 PM  Radiology DG Chest 2 View  Result Date: 12/07/2021 CLINICAL DATA:  Shortness of breath with chest pain. Elevated blood pressure. EXAM: CHEST - 2 VIEW COMPARISON:  Radiographs 05/07/2020.  CT 03/26/2018. FINDINGS: The heart size and mediastinal contours are normal. The lungs are clear. There is no pleural effusion or pneumothorax. No acute osseous findings are identified. Postsurgical changes in the lower cervical spine and mild thoracic spine degenerative changes. IMPRESSION: Stable chest.  No evidence of active cardiopulmonary process. Electronically Signed   By: Richardean Sale M.D.   On: 12/07/2021 10:43    Procedures Procedures    Medications Ordered in ED Medications - No data to display  ED Course/ Medical Decision Making/ A&P                           Medical Decision Making Risk OTC  drugs. Prescription drug management.    76 year old female with past medical history of GERD, CAD, HLD, hypertension presenting with difficulty controlling her blood pressure on her home medications since receiving a cortisone shot, with associated intermittent dull headache. She is not currently experiencing any chest pain, tightness, or shortness of breath. She has not taken her blood pressure medication today. Blood pressure on my evaluation 168/91.  Differential diagnosis includes hypertensive urgency, hypertensive emergency, AKI, electrolyte abnormalities, arrhythmia, ACS.  Labs reviewed and reassuring. No leukocytosis, anemia, or AKI. Initial trop 6, with second 6.  Mild hypokalemia of 3.3.  EKG reviewed and showed sinus rhythm, rate 80, occasional PVCs. No ST elevations or depressions.  Chest XR reassuring.  No signs of focal opacity/pneumonia, pulmonary edema, or pneumothorax. Low suspicion for ACS at this time in setting of low risk heart score of 3, nonischemic EKG, and 2 reassuring troponins.  Overall, concern for hypertensive urgency with symptoms of headache, in the setting of recent cortisone shot and missing a dose of her home medication.  Physical exam reassuring, with nonfocal neurologic exam. Labs reassuring, no signs of end organ damage.  Patient given her home antihypertensive that she had not taken, which is HCTZ.  She was also treated with Tylenol.  On reevaluation, patient symptoms are improving.  Blood pressure has continued to downtrend.  Recommended continuation of her home pressure medications and follow-up with her primary doctor.  Also recommended follow-up with her cardiologist.  Strict return precautions given.        Final Clinical Impression(s) / ED Diagnoses Final diagnoses:  Hypertension, unspecified type    Rx / DC Orders ED Discharge Orders     None         Rosine Abe, MD 12/07/21 2345    Lacretia Leigh, MD 12/11/21 2120

## 2021-12-07 NOTE — ED Triage Notes (Signed)
Pt. Stated, I had a cortisone in my shoulder yesterday and my BP is not coming down.

## 2021-12-07 NOTE — ED Notes (Signed)
Pt denies any SHOB at this time.

## 2021-12-07 NOTE — Discharge Instructions (Signed)
Continue your home medications. Follow up with your primary doctor.  Additionally follow-up with your cardiologist to review your medications and discuss your intermittent chest pains.  Return to the ED as needed or for worsening of your symptoms.

## 2021-12-13 NOTE — Progress Notes (Deleted)
Cardiology Office Note:    Date:  12/13/2021   ID:  Amanda [name], DOB Aug 03, 1945, MRN [id number]  PCP:  Guerry Minors, [facility]  Cardiologist:  Freada Bergeron, MD  Advanced Practice Provider:  No care team member to display Electrophysiologist:  None    Referring MD: Guerry Minors, FNP     History of Present Illness:    Amanda [name] is a 76 y.o. female with a hx of HTN, HLD, and known CAD (cath 2015 with 70% non-dominant RCA and recent CTA with prox LAD mild <50% stenosis) who was previously followed by Dr. [name] who now presents to clinic for follow-up.   Patient underwent stress echocardiogram October 2015 for chest pain found to have electocardiographic changes and possible anteroapical wall motion abnormality. Cardiac catheterization November 2015 showed a 70% small nondominant right coronary artery and an ejection fraction of 55-65%. Medical therapy recommended. Nuclear study December 2019 showed ejection fraction 62% poor quality study question small apical infarct and mild inferobasilar ischemia.  Coronary CTA January 2020 showed calcium score 53 and mild stenosis proximal LAD.  Treated medically.  During our visit on 04/18/20 where she was having episodes of shortness of breath. She was notably bradycardic during her visit with HR 52. We stopped her atenolol at that time. Symptoms also correlated with the recent loss of her husband. TTE was also obtained due to systolic murmur, which showed LVEF 60-65%, G1DD, normal RV, mildly dilated ascending aorta.  She had peripheral angiography on 08/01/20 revealing high-grade segmental mid left SFA stenosis with two-vessel runoff, 50% mid right SFA stenosis. Directional atherectomy was performed followed by drug-coated balloon angioplasty with excellent result. Dopplers on 02/21/21 revealed normal ABIs bilaterally with a widely patent left SFA. Recommendation to repeat doppler studies in 1 year.    TTE 03/2021 showed LVEF 60-65%, G1DD.  Was last seen by Robbie Lis on 07/2021 where she was having back pain but was doing well from a CV standpoint.     Past Medical History:  Diagnosis Date   Anxiety    Arthritis    Back pain    CAD (coronary artery disease)    a. 01/2014: cath showing 70% stenosis of non-dominant RCA --> medically managed.    Cataract    bilat removed   DDD (degenerative disc disease), cervical    DDD (degenerative disc disease), lumbar    Depression    Diverticulosis    GERD (gastroesophageal reflux disease)    Headache    Hemorrhoids    Hiatal hernia    Hyperlipidemia    Hypertension    Melanoma ([place])    Orbital fracture ([place])    right   Osteopenia 07/2015   T score -2.0 FRAX 10%/1.4%   Osteoporosis 08/29/2015   Pituitary tumor    Stricture of esophagus    Vitamin D deficiency     Past Surgical History:  Procedure Laterality Date   ABDOMINAL AORTOGRAM W/LOWER EXTREMITY N/A 08/01/2020   Procedure: ABDOMINAL AORTOGRAM W/LOWER EXTREMITY;  Surgeon: Lorretta Harp, MD;  Location: [place] [facility];  Service: Cardiovascular;  Laterality: N/A;   CERVICAL DISC SURGERY     x 2   COLONOSCOPY     EXCISION OF SKIN CANCER     Melanoma   FOOT SURGERY Right    HEMORRHOID BANDING     X3   LEFT HEART CATHETERIZATION WITH CORONARY ANGIOGRAM N/A 02/03/2014   Procedure: LEFT HEART CATHETERIZATION WITH CORONARY  Cyril Loosen;  Surgeon: [name], MD;  Location: [facility] C[facility];  Service: Cardiovascular;  Laterality: N/A;   [place] SURGERY  2018   OOPHORECTOMY  2012   BSO   PELVIC LAPAROSCOPY  2012   Diag Lap-BSO-lysis of adhesions   PERIPHERAL VASCULAR INTERVENTION Left 08/01/2020   Procedure: PERIPHERAL VASCULAR INTERVENTION;  Surgeon: Lorretta Harp, MD;  Location: [place] [facility];  Service: Cardiovascular;  Laterality: Left;   TUBAL LIGATION     VAGINAL HYSTERECTOMY  1993    Current Medications: No outpatient medications have been  marked as taking for the 12/15/21 encounter (Appointment) with Freada Bergeron, MD.   Current Facility-Administered Medications for the 12/15/21 encounter (Appointment) with Freada Bergeron, MD  Medication   sodium chloride flush (NS) 0.9 % injection 3 mL     Allergies:   Ciprofloxacin, Lisinopril, Pneumococcal vaccine, Pravastatin, Crestor [rosuvastatin], Lipitor [atorvastatin], Norvasc [amlodipine], and Zetia [ezetimibe]   Social History   Socioeconomic History   Marital status: Widowed    Spouse name: Not on file   Number of children: 3   Years of education: Not on file   Highest education level: 10th grade  Occupational History   Occupation: Disabled/retired  Tobacco Use   Smoking status: Former    Years: 20.00    Types: Cigarettes    Quit date: 03/19/1994    Years since quitting: 27.7   Smokeless tobacco: Never  Vaping Use   Vaping Use: Never used  Substance and Sexual Activity   Alcohol use: No    Alcohol/week: 0.0 standard drinks of alcohol   Drug use: No   Sexual activity: Not Currently    Birth control/protection: Surgical    Comment: 1st intercourse 76 yo-Fewer than 5 partners,des neg  Other Topics Concern   Not on file  Social History Narrative   Lives with daughter    Some caffeine    Social Determinants of Health   Financial Resource Strain: Not on file  Food Insecurity: Not on file  Transportation Needs: Not on file  Physical Activity: Not on file  Stress: Not on file  Social Connections: Not on file     Family History: The patient's family history includes Cancer in her brother and father; Colon cancer in her brother, father, and sister; Colon polyps in her brother, father, and sister; Diabetes in her brother and sister; Heart disease in her brother, father, mother, sister, sister, and sister; Hypertension in her daughter, father, and mother; Melanoma in her brother, brother, and sister; Migraines in her daughter; Prostate cancer in her  brother; Skin cancer in her brother, brother, daughter, daughter, and son; Stroke in her mother. There is no history of Esophageal cancer, Rectal cancer, Stomach cancer, or Breast cancer.  ROS:   Please see the history of present illness.    Review of Systems  Constitutional:  Negative for chills and fever.  HENT:  Negative for hearing loss.   Eyes:  Negative for blurred vision and redness.  Respiratory:  Negative for shortness of breath.   Cardiovascular:  Negative for chest pain, palpitations, orthopnea, claudication, leg swelling and PND.  Gastrointestinal:  Negative for melena, nausea and vomiting.  Genitourinary:  Negative for dysuria and flank pain.  Musculoskeletal:  Positive for myalgias.  Neurological:  Negative for dizziness and loss of consciousness.  Endo/Heme/Allergies:  Negative for polydipsia.  Psychiatric/Behavioral:  Negative for substance abuse.     EKGs/Labs/Other Studies Reviewed:    The following studies were reviewed today:  Echo 03/2021  1. Left ventricular ejection fraction, by estimation, is 60 to 65%. The  left ventricle has normal function. The left ventricle has no regional  wall motion abnormalities. There is severe left ventricular hypertrophy.  Left ventricular diastolic parameters   are consistent with Grade I diastolic dysfunction (impaired relaxation).   2. Right ventricular systolic function is normal. The right ventricular  size is normal.   3. Mobile / redundant possible PFO.   4. The mitral valve is normal in structure. No evidence of mitral valve  regurgitation. No evidence of mitral stenosis.   5. The aortic valve is normal in structure. Aortic valve regurgitation is  mild. No aortic stenosis is present.   6. Aortic dilatation noted. There is mild dilatation of the aortic root,  measuring 38 mm. There is moderate dilatation of the ascending aorta,  measuring 42 mm.   7. The inferior vena cava is normal in size with greater than 50%   respiratory variability, suggesting right atrial pressure of 3 mmHg.  Myoview 2019: Myocardial perfusion is abnormal.  Findings consistent with ischemia and prior myocardial infarction.  This is an intermediate risk study.  Overall left ventricular systolic function was normal.    LV cavity size is mildly enlarged.  Nuclear stress EF: 62%.  The left ventricular ejection fraction is normal (55-65%).   There is no prior study for comparison.   Coronary CTA 03/2018: FINDINGS: Non-cardiac: See separate report from [facility].   Pulmonary veins drain normally to the left atrium.   Calcium Score: 53 Agatston units.   Coronary Arteries: Left dominant with no anomalies   LM: Short, no plaque or stenosis.   LAD system: Mixed plaque in the proximal LAD with mild (<50%) stenosis.   Circumflex system: No plaque or stenosis.   RCA system: Small, nondominant vessel.  No plaque or stenosis.   IMPRESSION: 1.Coronary artery calcium score 53 Agatston units. This places the patient in the 64th percentile for age and gender, suggesting intermediate risk for future cardiac events.   2.  Nonobstructive disease in the proximal LAD.  TTE 05/06/20: IMPRESSIONS   1. Left ventricular ejection fraction, by estimation, is 60 to 65%. Left  ventricular ejection fraction by 3D volume is 63 %. The left ventricle has  normal function. The left ventricle has no regional wall motion  abnormalities. Left ventricular diastolic   parameters are consistent with Grade I diastolic dysfunction (impaired  relaxation). The average left ventricular global longitudinal strain is  -24.3 %. The global longitudinal strain is normal.   2. Right ventricular systolic function is normal. The right ventricular  size is normal. There is normal pulmonary artery systolic pressure. The  estimated right ventricular systolic pressure is 10.2 mmHg.   3. The mitral valve is grossly normal. No evidence of mitral valve   regurgitation.   4. The aortic valve is tricuspid. Aortic valve regurgitation is trivial.  No aortic stenosis is present.   5. Aortic dilatation noted. There is mild dilatation of the aortic root,  measuring 42 mm. There is mild dilatation of the ascending aorta,  measuring 42 mm.    Recent Labs: 03/29/2021: ALT 6 12/07/2021: BUN 13; Creatinine, Ser 0.92; Hemoglobin 13.5; Platelets 254; Potassium 3.3; Sodium 137  Recent Lipid Panel    Component Value Date/Time   CHOL 145 03/29/2021 1123   TRIG 212 (H) 03/29/2021 1123   HDL 39 (L) 03/29/2021 1123   CHOLHDL 3.7 03/29/2021 1123   LDLCALC 71 03/29/2021 1123  Risk Assessment/Calculations:       Physical Exam:    VS:  There were no vitals taken for this visit.    Wt Readings from Last 3 Encounters:  12/07/21 166 lb (75.3 kg)  07/25/21 162 lb (73.5 kg)  07/19/21 166 lb (75.3 kg)     GEN:  Well nourished, well developed in no acute distress HEENT: Normal NECK: No carotid bruits; normal JVD CARDIAC: RRR, 2/6 systolic murmur. No rubs, gallops RESPIRATORY:  Clear to auscultation without rales, wheezing or rhonchi  ABDOMEN: Soft, non-tender, non-distended MUSCULOSKELETAL:  No edema; No deformity  SKIN: Warm and dry NEUROLOGIC:  Alert and oriented x 3 PSYCHIATRIC:  Normal affect   ASSESSMENT:    No diagnosis found.  PLAN:    In order of problems listed above:  #Shortness of breath: Resolved. Cardiac work-up including coronary CTA and myoview reassuringly normal. TTE 03/2021 with normal BiV function, G1DD, no significant valve disease. -Stopped atenolol -Continue losartan '100mg'$  daily -TTE with normal LVEF, no significant valve disease -Continue to monitor   #Cardiac Murmur: 2/6 systolic murmur on exam. Normal EF on myoview. TTE without significant valvular disease. -TTE with normal LVEF, no significant valve disease  #Aortic root and ascending aorta dilation: Root measures 78m and ascending aorta measures  483m  -Continue serial monitoring with yearly TTEs -Continue blood pressure control as detailed below   #Mild non-obstructive CAD: CTA coronaries with mild <50% LAD disease; small non-dominant RCA. Calcium score 53 (64% for age and gender controls).  -Continue ASA '81mg'$  daily -Continue repatha '420mg'$  q 30days  #PAD: Underwent peripheral angiography on 08/01/20 revealing high-grade segmental mid left SFA stenosis with two-vessel runoff, 50% mid right SFA stenosis. Directional atherectomy was performed followed by drug-coated balloon angioplasty with excellent result. Dopplers on 02/21/21 revealed normal ABIs bilaterally with a widely patent left SFA. -Continue ASA '81mg'$  daily -Serial monitoring with ABIs  -Follow-up with Dr. BeGwenlyn Founds scheduled   #HTN: -Stopped atenolol due to bradycardia as above -Continue HCTZ to '25mg'$  in the AM -Continue losartan '100mg'$  daily  #HLD: -Continue repatha     Medication Adjustments/Labs and Tests Ordered: Current medicines are reviewed at length with the patient today.  Concerns regarding medicines are outlined above.  No orders of the defined types were placed in this encounter.  No orders of the defined types were placed in this encounter.   There are no Patient Instructions on file for this visit.    Signed, HeFreada BergeronMD  12/13/2021 11:03 AM    [place]

## 2021-12-15 ENCOUNTER — Ambulatory Visit: Payer: Medicare Other | Admitting: Cardiology

## 2021-12-18 NOTE — Progress Notes (Signed)
Office Visit    Patient Name: Amanda [name] Date of Encounter: 12/19/2021  PCP:  Guerry Minors, [facility]  Cardiologist:  Freada Bergeron, MD  Advanced Practice Provider:  No care team member to display Electrophysiologist:  None   HPI    Amanda [name] is a 76 y.o. female with past medical history significant for CAD, PVD, hypertension, hyperlipidemia, GERD, and chronic back pain presents today for hospital follow-up.  Patient underwent stress echocardiogram October 2015 for chest pain and found to have electrocardiographic changes and possible anterior septal wall motion abnormality.  Cardiac catheterization November 2015 showed a 70% small nondominant right coronary artery and EF of 55 to 60%.  Medical therapy recommended.  Nuclear study December 2019 showed ejection fraction of 60%, poor quality study question small apical infarct and mild inferiobasilar ischemia.  Coronary CTA January 2020 showed calcium score of 53 and mild stenosis in the proximal LAD.  Treated medically.  She then underwent peripheral angiography on 08/01/2020 revealing high-grade segmental mid left SFA stenosis with two-vessel runoff, 50% mid right SFA stenosis.  Directional atherectomy was performed followed by drug-coated balloon angioplasty with excellent result.  Dopplers on 12/22 revealed normal ABIs bilaterally with a widely patent left SFA.  Recommend repeat Doppler studies in 1 year.  Echocardiogram 03/2021 with LVEF 65% and grade 1 DD.  She was last seen 07/2021 and had a sudden episode of lower back pain.  It felt sharp in nature and she was diaphoretic.  No shortness of breath or chest pain at the time.  She was preparing a meal and suddenly had back pain while turning.  Symptoms lasted for 3 minutes.  No associated palpitations.  She took hydrocodone with immediate relief.  Blood pressure and heart rate were normal.  Patient reported left shoulder pain with numbness  and tingling down the left arm.  She never had substernal chest pain or tightness.  No regular exercise but does gardening and walking to the mailbox.  She reported orthopedic neck issues.  Her symptoms were consistent with musculoskeletal etiology.  Recently seen in the ED 12/07/2021 with increased blood pressure.  She had not taken her blood pressure medications.  Recent steroid injection in her right shoulder the day previous.  Patient also reported intermittent atypical chest pain for some time.  Troponins were negative.  Given her home medications further treatment of her blood pressure.  Blood pressure improved.  Today, she tells me that she was recently in the ED and her blood pressure was high.  She had COVID a year ago and is still dealing with symptoms.  She also states that she would like to stop her Repatha because she is having myalgias again in her legs.  She has had issues with several different cholesterol medicines.  She states she is only drinking maybe about 40 ounces of water daily.  She also has chronic back pain which contributes to her hypertension.  She is taking Lopressor 100 mg at night and HCTZ 25 mg in the morning.  When she went to the ED with hypertension she had not taken her morning dose of HCTZ.  Reports no shortness of breath nor dyspnea on exertion. Reports no chest pain, pressure, or tightness. No edema, orthopnea, PND. Reports no palpitations.    Past Medical History    Past Medical History:  Diagnosis Date   Anxiety    Arthritis    Back pain    CAD (  coronary artery disease)    a. 01/2014: cath showing 70% stenosis of non-dominant RCA --> medically managed.    Cataract    bilat removed   DDD (degenerative disc disease), cervical    DDD (degenerative disc disease), lumbar    Depression    Diverticulosis    GERD (gastroesophageal reflux disease)    Headache    Hemorrhoids    Hiatal hernia    Hyperlipidemia    Hypertension    Melanoma ([place])    Orbital  fracture ([place])    right   Osteopenia 07/2015   T score -2.0 FRAX 10%/1.4%   Osteoporosis 08/29/2015   Pituitary tumor    Stricture of esophagus    Vitamin D deficiency    Past Surgical History:  Procedure Laterality Date   ABDOMINAL AORTOGRAM W/LOWER EXTREMITY N/A 08/01/2020   Procedure: ABDOMINAL AORTOGRAM W/LOWER EXTREMITY;  Surgeon: Lorretta Harp, MD;  Location: [place] [facility] [facility];  Service: Cardiovascular;  Laterality: N/A;   CERVICAL DISC SURGERY     x 2   COLONOSCOPY     EXCISION OF SKIN CANCER     Melanoma   FOOT SURGERY Right    HEMORRHOID BANDING     X3   LEFT HEART CATHETERIZATION WITH CORONARY ANGIOGRAM N/A 02/03/2014   Procedure: LEFT HEART CATHETERIZATION WITH CORONARY ANGIOGRAM;  Surgeon: [name], MD;  Location: [facility];  Service: Cardiovascular;  Laterality: N/A;   [place] SURGERY  2018   OOPHORECTOMY  2012   BSO   PELVIC LAPAROSCOPY  2012   Diag Lap-BSO-lysis of adhesions   PERIPHERAL VASCULAR INTERVENTION Left 08/01/2020   Procedure: PERIPHERAL VASCULAR INTERVENTION;  Surgeon: Lorretta Harp, MD;  Location: [place] [facility];  Service: Cardiovascular;  Laterality: Left;   TUBAL LIGATION     VAGINAL HYSTERECTOMY  1993    Allergies  Allergies  Allergen Reactions   Ciprofloxacin Rash   Lisinopril Swelling   Pneumococcal Vaccine Swelling   Pravastatin Other (See Comments)    Muscle aches    Crestor [Rosuvastatin]     myalgias   Lipitor [Atorvastatin]     myalgias   Norvasc [Amlodipine]     swelling   Zetia [Ezetimibe]     "felt horrible"     EKGs/Labs/Other Studies Reviewed:   The following studies were reviewed today:  Echocardiogram 04/14/2021  IMPRESSIONS     1. Left ventricular ejection fraction, by estimation, is 60 to 65%. The  left ventricle has normal function. The left ventricle has no regional  wall motion abnormalities. There is severe left ventricular hypertrophy.  Left ventricular diastolic parameters    are consistent with Grade I diastolic dysfunction (impaired relaxation).   2. Right ventricular systolic function is normal. The right ventricular  size is normal.   3. Mobile / redundant possible PFO.   4. The mitral valve is normal in structure. No evidence of mitral valve  regurgitation. No evidence of mitral stenosis.   5. The aortic valve is normal in structure. Aortic valve regurgitation is  mild. No aortic stenosis is present.   6. Aortic dilatation noted. There is mild dilatation of the aortic root,  measuring 38 mm. There is moderate dilatation of the ascending aorta,  measuring 42 mm.   7. The inferior vena cava is normal in size with greater than 50%  respiratory variability, suggesting right atrial pressure of 3 mmHg.   FINDINGS   Left Ventricle: Left ventricular ejection fraction, by estimation, is 60  to 65%. The left ventricle has normal function. The left ventricle has no  regional wall motion abnormalities. The left ventricular internal cavity  size was normal in size. There is   severe left ventricular hypertrophy. Left ventricular diastolic  parameters are consistent with Grade I diastolic dysfunction (impaired  relaxation).   Right Ventricle: The right ventricular size is normal. No increase in  right ventricular wall thickness. Right ventricular systolic function is  normal.   Left Atrium: Left atrial size was normal in size.   Right Atrium: Right atrial size was normal in size.   Pericardium: There is no evidence of pericardial effusion.   Mitral Valve: The mitral valve is normal in structure. No evidence of  mitral valve regurgitation. No evidence of mitral valve stenosis.   Tricuspid Valve: The tricuspid valve is normal in structure. Tricuspid  valve regurgitation is mild . No evidence of tricuspid stenosis.   Aortic Valve: The aortic valve is normal in structure. Aortic valve  regurgitation is mild. No aortic stenosis is present.   Pulmonic Valve:  The pulmonic valve was normal in structure. Pulmonic valve  regurgitation is not visualized. No evidence of pulmonic stenosis.   Aorta: Aortic dilatation noted. There is mild dilatation of the aortic  root, measuring 38 mm. There is moderate dilatation of the ascending  aorta, measuring 42 mm.   Venous: The inferior vena cava is normal in size with greater than 50%  respiratory variability, suggesting right atrial pressure of 3 mmHg.   IAS/Shunts: The interatrial septum was not well visualized.  EKG:  EKG is not ordered today.  Recent Labs: 03/29/2021: ALT 6 12/07/2021: BUN 13; Creatinine, Ser 0.92; Hemoglobin 13.5; Platelets 254; Potassium 3.3; Sodium 137  Recent Lipid Panel    Component Value Date/Time   CHOL 145 03/29/2021 1123   TRIG 212 (H) 03/29/2021 1123   HDL 39 (L) 03/29/2021 1123   CHOLHDL 3.7 03/29/2021 1123   LDLCALC 71 03/29/2021 1123     Home Medications   Current Meds  Medication Sig   AMBULATORY NON FORMULARY MEDICATION TAM herbal laxative Take 2 tablet by mouth every other day as needed   aspirin EC 81 MG tablet Take 81 mg by mouth daily. Swallow whole.   Bempedoic Acid (NEXLETOL) 180 MG TABS Take 180 mg by mouth daily.   Cholecalciferol (VITAMIN D PO) Take 2,000 Units by mouth daily.   Cholecalciferol (VITAMIN D3) 50 MCG (2000 UT) TABS Take 2 tablets by mouth daily.   diclofenac Sodium (VOLTAREN) 1 % GEL Apply 2-4 g topically as needed.   diphenhydrAMINE HCl, Sleep, (SLEEP AID) 50 MG CAPS Take 50 mg by mouth at bedtime.   fluticasone (FLONASE) 50 MCG/ACT nasal spray Place 1 spray into both nostrils daily as needed for allergies.   hydrochlorothiazide (HYDRODIURIL) 25 MG tablet Take 1 tablet (25 mg total) by mouth daily.   HYDROcodone-acetaminophen (NORCO) 10-325 MG tablet Take 1 tablet by mouth every 6 (six) hours as needed (Back pain).   LORazepam (ATIVAN) 0.5 MG tablet Take 0.5 mg by mouth daily as needed for anxiety.   losartan (COZAAR) 100 MG tablet  Take 1 tablet (100 mg total) by mouth at bedtime.   pantoprazole (PROTONIX) 40 MG tablet TAKE ONE TABLET BY MOUTH EVERY DAY   [DISCONTINUED] Evolocumab with Infusor ([place]) 420 MG/3.5ML SOCT INJECT '420MG'$  UNDER THE SKIN EVERY 30 DAYS   Current Facility-Administered Medications for the 12/19/21 encounter (Office Visit) with Elgie Collard, PA-C  Medication  sodium chloride flush (NS) 0.9 % injection 3 mL     Review of Systems      All other systems reviewed and are otherwise negative except as noted above.  Physical Exam    VS:  BP (!) 160/80   Pulse 69   Ht '5\' 5"'$  (1.651 m)   Wt 161 lb 9.6 oz (73.3 kg)   SpO2 99%   BMI 26.89 kg/m  , BMI Body mass index is 26.89 kg/m.  Wt Readings from Last 3 Encounters:  12/19/21 161 lb 9.6 oz (73.3 kg)  12/07/21 166 lb (75.3 kg)  07/25/21 162 lb (73.5 kg)     GEN: Well nourished, well developed, in no acute distress. HEENT: normal. Neck: Supple, no JVD, carotid bruits, or masses. Cardiac: RRR, no murmurs, rubs, or gallops. No clubbing, cyanosis, edema.  Radials/PT 2+ and equal bilaterally.  Respiratory:  Respirations regular and unlabored, clear to auscultation bilaterally. GI: Soft, nontender, nondistended. MS: No deformity or atrophy. Skin: Warm and dry, no rash. Neuro:  Strength and sensation are intact. Psych: Normal affect.  Assessment & Plan    Hypertension -Remains elevated today -She is taking Losartan 100 mg daily at night, and HCTZ 25 mg in the morning. Consider switching to Valsartan '160mg'$  daily if remains high -She tells me it has been as low as 423 systolic at home and other times its in the 953U systolic.  -She also has chronic back pain which contributes to her hypertension (she is on hydrocodone daily) -Please track your BP twice a day and record for 1 week.   Nonobstructive CAD -no chest pain -continue risk reduction with medication regimen  PVD -She is not having any pain with walking but  more mylagias that she believes is related to her Repatha and she would like to change this medication  Hyperlipidemia -last dose of Repatha 9/21, she would like to switch to [employer] but insurance will not cover at this moment -Plan to start Nexletol '180mg'$  daily until she switches insurance -Follow-up lipid panel at next visit   HYPERTENSION CONTROL Vitals:   12/19/21 1059 12/19/21 1157  BP: (!) 154/92 (!) 160/80    The patient's blood pressure is elevated above target today.  In order to address the patient's elevated BP: Blood pressure will be monitored at home to determine if medication changes need to be made.         Disposition: Follow up 3 months with Freada Bergeron, MD or APP.  Signed, Elgie Collard, PA-C 12/19/2021, 11:59 AM [place]

## 2021-12-19 ENCOUNTER — Encounter: Payer: Self-pay | Admitting: Physician Assistant

## 2021-12-19 ENCOUNTER — Ambulatory Visit: Payer: Medicare Other | Attending: Cardiology | Admitting: Physician Assistant

## 2021-12-19 VITALS — BP 160/80 | HR 69 | Ht 65.0 in | Wt 161.6 lb

## 2021-12-19 DIAGNOSIS — I10 Essential (primary) hypertension: Secondary | ICD-10-CM | POA: Diagnosis not present

## 2021-12-19 DIAGNOSIS — E785 Hyperlipidemia, unspecified: Secondary | ICD-10-CM | POA: Diagnosis not present

## 2021-12-19 DIAGNOSIS — Z789 Other specified health status: Secondary | ICD-10-CM | POA: Diagnosis not present

## 2021-12-19 DIAGNOSIS — I2510 Atherosclerotic heart disease of native coronary artery without angina pectoris: Secondary | ICD-10-CM

## 2021-12-19 DIAGNOSIS — I739 Peripheral vascular disease, unspecified: Secondary | ICD-10-CM

## 2021-12-19 MED ORDER — NEXLETOL 180 MG PO TABS: 180.0000 mg | ORAL_TABLET | Freq: Every day | ORAL | 11 refills | Status: DC

## 2021-12-19 NOTE — Patient Instructions (Signed)
Medication Instructions:  1.Stop Repatha 2.Start Nexletol 180 mg daily *If you need a refill on your cardiac medications before your next appointment, please call your pharmacy*  Lab Work: None If you have labs (blood work) drawn today and your tests are completely normal, you will receive your results only by: Spring (if you have MyChart) OR A paper copy in the mail If you have any lab test that is abnormal or we need to change your treatment, we will call you to review the results.  Follow-Up: At [facility], you and your health needs are our priority.  As part of our continuing mission to provide you with exceptional heart care, we have created designated Provider Care Teams.  These Care Teams include your primary Cardiologist (physician) and Advanced Practice Providers (APPs -  Physician Assistants and Nurse Practitioners) who all work together to provide you with the care you need, when you need it.  We recommend signing up for the patient portal called "MyChart".  Sign up information is provided on this After Visit Summary.  MyChart is used to connect with patients for Virtual Visits (Telemedicine).  Patients are able to view lab/test results, encounter notes, upcoming appointments, etc.  Non-urgent messages can be sent to your provider as well.   To learn more about what you can do with MyChart, go to [email or web address].    Your next appointment:   3 month(s)  The format for your next appointment:   In Person  Provider:   Freada Bergeron, MD     Other Instructions 1.Be sure to hydrate with at least 64 oz of fluid daily 2.Check your blood pressure one hour after your morning meds and one hour after your evening meds over the next week, keep a log and call us with the readings.   Low-Sodium Eating Plan Sodium, which is an element that makes up salt, helps you maintain a healthy balance of fluids in your body. Too much sodium can increase your blood  pressure and cause fluid and waste to be held in your body. Your health care provider or dietitian may recommend following this plan if you have high blood pressure (hypertension), kidney disease, liver disease, or heart failure. Eating less sodium can help lower your blood pressure, reduce swelling, and protect your heart, liver, and kidneys. What are tips for following this plan? Reading food labels The Nutrition Facts label lists the amount of sodium in one serving of the food. If you eat more than one serving, you must multiply the listed amount of sodium by the number of servings. Choose foods with less than 140 mg of sodium per serving. Avoid foods with 300 mg of sodium or more per serving. Shopping  Look for lower-sodium products, often labeled as "low-sodium" or "no salt added." Always check the sodium content, even if foods are labeled as "unsalted" or "no salt added." Buy fresh foods. Avoid canned foods and pre-made or frozen meals. Avoid canned, cured, or processed meats. Buy breads that have less than 80 mg of sodium per slice. Cooking  Eat more home-cooked food and less restaurant, buffet, and fast food. Avoid adding salt when cooking. Use salt-free seasonings or herbs instead of table salt or sea salt. Check with your health care provider or pharmacist before using salt substitutes. Cook with plant-based oils, such as canola, sunflower, or olive oil. Meal planning When eating at a restaurant, ask that your food be prepared with less salt or no salt, if possible. Avoid  dishes labeled as brined, pickled, cured, smoked, or made with soy sauce, miso, or teriyaki sauce. Avoid foods that contain MSG (monosodium glutamate). MSG is sometimes added to [place] food, bouillon, and some canned foods. Make meals that can be grilled, baked, poached, roasted, or steamed. These are generally made with less sodium. General information Most people on this plan should limit their sodium intake to  1,500-2,000 mg (milligrams) of sodium each day. What foods should I eat? Fruits Fresh, frozen, or canned fruit. Fruit juice. Vegetables Fresh or frozen vegetables. "No salt added" canned vegetables. "No salt added" tomato sauce and paste. Low-sodium or reduced-sodium tomato and vegetable juice. Grains Low-sodium cereals, including oats, puffed wheat and rice, and shredded wheat. Low-sodium crackers. Unsalted rice. Unsalted pasta. Low-sodium bread. Whole-grain breads and whole-grain pasta. Meats and other proteins Fresh or frozen (no salt added) meat, poultry, seafood, and fish. Low-sodium canned tuna and salmon. Unsalted nuts. Dried peas, beans, and lentils without added salt. Unsalted canned beans. Eggs. Unsalted nut butters. Dairy Milk. Soy milk. Cheese that is naturally low in sodium, such as ricotta cheese, fresh mozzarella, or Swiss cheese. Low-sodium or reduced-sodium cheese. Cream cheese. Yogurt. Seasonings and condiments Fresh and dried herbs and spices. Salt-free seasonings. Low-sodium mustard and ketchup. Sodium-free salad dressing. Sodium-free light mayonnaise. Fresh or refrigerated horseradish. Lemon juice. Vinegar. Other foods Homemade, reduced-sodium, or low-sodium soups. Unsalted popcorn and pretzels. Low-salt or salt-free chips. The items listed above may not be a complete list of foods and beverages you can eat. Contact a dietitian for more information. What foods should I avoid? Vegetables Sauerkraut, pickled vegetables, and relishes. Olives. [place] fries. Onion rings. Regular canned vegetables (not low-sodium or reduced-sodium). Regular canned tomato sauce and paste (not low-sodium or reduced-sodium). Regular tomato and vegetable juice (not low-sodium or reduced-sodium). Frozen vegetables in sauces. Grains Instant hot cereals. Bread stuffing, pancake, and biscuit mixes. Croutons. Seasoned rice or pasta mixes. Noodle soup cups. Boxed or frozen macaroni and cheese. Regular  salted crackers. Self-rising flour. Meats and other proteins Meat or fish that is salted, canned, smoked, spiced, or pickled. Precooked or cured meat, such as sausages or meat loaves. [place]ce Salines. Ham. Pepperoni. Hot dogs. Corned beef. Chipped beef. Salt pork. Jerky. Pickled herring. Anchovies and sardines. Regular canned tuna. Salted nuts. Dairy Processed cheese and cheese spreads. Hard cheeses. Cheese curds. Blue cheese. Feta cheese. String cheese. Regular cottage cheese. Buttermilk. Canned milk. Fats and oils Salted butter. Regular margarine. Ghee. Bacon fat. Seasonings and condiments Onion salt, garlic salt, seasoned salt, table salt, and sea salt. Canned and packaged gravies. Worcestershire sauce. Tartar sauce. Barbecue sauce. Teriyaki sauce. Soy sauce, including reduced-sodium. Steak sauce. Fish sauce. Oyster sauce. Cocktail sauce. Horseradish that you find on the shelf. Regular ketchup and mustard. Meat flavorings and tenderizers. Bouillon cubes. Hot sauce. Pre-made or packaged marinades. Pre-made or packaged taco seasonings. Relishes. Regular salad dressings. Salsa. Other foods Salted popcorn and pretzels. Corn chips and puffs. Potato and tortilla chips. Canned or dried soups. Pizza. Frozen entrees and pot pies. The items listed above may not be a complete list of foods and beverages you should avoid. Contact a dietitian for more information. Summary Eating less sodium can help lower your blood pressure, reduce swelling, and protect your heart, liver, and kidneys. Most people on this plan should limit their sodium intake to 1,500-2,000 mg (milligrams) of sodium each day. Canned, boxed, and frozen foods are high in sodium. Restaurant foods, fast foods, and pizza are also very high in sodium. You also  get sodium by adding salt to food. Try to cook at home, eat more fresh fruits and vegetables, and eat less fast food and canned, processed, or prepared foods. This information is not intended to  replace advice given to you by your health care provider. Make sure you discuss any questions you have with your health care provider. Document Revised: 04/10/2019 Document Reviewed: 02/04/2019 Elsevier Patient Education  [place]

## 2021-12-22 ENCOUNTER — Telehealth: Payer: Self-pay | Admitting: Cardiology

## 2021-12-22 NOTE — Telephone Encounter (Signed)
Patient calling in to give her bp. Please advise   146/98 HR 66 120/72 HR 62 139/84 HR 74 118/76 HR 60 147/82 HR 78 133/77 HR 64

## 2021-12-22 NOTE — Telephone Encounter (Signed)
Patient calling to report BP readings after visit with Nicholes Rough, PA.

## 2021-12-25 DIAGNOSIS — M961 Postlaminectomy syndrome, not elsewhere classified: Secondary | ICD-10-CM | POA: Diagnosis not present

## 2021-12-25 DIAGNOSIS — M5412 Radiculopathy, cervical region: Secondary | ICD-10-CM | POA: Diagnosis not present

## 2021-12-25 DIAGNOSIS — M5136 Other intervertebral disc degeneration, lumbar region: Secondary | ICD-10-CM | POA: Diagnosis not present

## 2021-12-25 DIAGNOSIS — M5030 Other cervical disc degeneration, unspecified cervical region: Secondary | ICD-10-CM | POA: Diagnosis not present

## 2021-12-25 NOTE — Telephone Encounter (Signed)
Left message for patient letting her know that Amanda [name] was happy with her BP readings. Advised to call back with any questions.

## 2021-12-29 DIAGNOSIS — D3132 Benign neoplasm of left choroid: Secondary | ICD-10-CM | POA: Diagnosis not present

## 2021-12-29 DIAGNOSIS — H52223 Regular astigmatism, bilateral: Secondary | ICD-10-CM | POA: Diagnosis not present

## 2022-01-05 DIAGNOSIS — E781 Pure hyperglyceridemia: Secondary | ICD-10-CM | POA: Diagnosis not present

## 2022-01-05 DIAGNOSIS — Z0000 Encounter for general adult medical examination without abnormal findings: Secondary | ICD-10-CM | POA: Diagnosis not present

## 2022-01-05 DIAGNOSIS — I10 Essential (primary) hypertension: Secondary | ICD-10-CM | POA: Diagnosis not present

## 2022-01-05 DIAGNOSIS — F419 Anxiety disorder, unspecified: Secondary | ICD-10-CM | POA: Diagnosis not present

## 2022-01-05 DIAGNOSIS — J302 Other seasonal allergic rhinitis: Secondary | ICD-10-CM | POA: Diagnosis not present

## 2022-01-11 DIAGNOSIS — J22 Unspecified acute lower respiratory infection: Secondary | ICD-10-CM | POA: Diagnosis not present

## 2022-01-18 DIAGNOSIS — Z87448 Personal history of other diseases of urinary system: Secondary | ICD-10-CM | POA: Diagnosis not present

## 2022-01-25 ENCOUNTER — Ambulatory Visit: Payer: Medicare Other | Admitting: Cardiology

## 2022-02-21 ENCOUNTER — Ambulatory Visit (HOSPITAL_COMMUNITY)
Admission: RE | Admit: 2022-02-21 | Discharge: 2022-02-21 | Disposition: A | Discharge status: Home / Self Care | Payer: Medicare Other | Source: Ambulatory Visit | Attending: Cardiovascular Disease | Admitting: Cardiovascular Disease

## 2022-02-21 DIAGNOSIS — I739 Peripheral vascular disease, unspecified: Secondary | ICD-10-CM | POA: Diagnosis not present

## 2022-03-01 DIAGNOSIS — H3562 Retinal hemorrhage, left eye: Secondary | ICD-10-CM | POA: Diagnosis not present

## 2022-03-02 DIAGNOSIS — L820 Inflamed seborrheic keratosis: Secondary | ICD-10-CM | POA: Diagnosis not present

## 2022-03-02 DIAGNOSIS — Z808 Family history of malignant neoplasm of other organs or systems: Secondary | ICD-10-CM | POA: Diagnosis not present

## 2022-03-02 DIAGNOSIS — D1801 Hemangioma of skin and subcutaneous tissue: Secondary | ICD-10-CM | POA: Diagnosis not present

## 2022-03-02 DIAGNOSIS — L821 Other seborrheic keratosis: Secondary | ICD-10-CM | POA: Diagnosis not present

## 2022-03-02 DIAGNOSIS — Z85828 Personal history of other malignant neoplasm of skin: Secondary | ICD-10-CM | POA: Diagnosis not present

## 2022-03-05 ENCOUNTER — Telehealth: Payer: Self-pay | Admitting: Cardiology

## 2022-03-05 NOTE — Telephone Encounter (Signed)
Called pt in regards to BP concerns.  Pt reports woke up on Wednesday morning with N/V.  Took BP d/t not feeling well BP elevated.  BP reading listed in call intake.   Pt reports BP have decreased but still above normal for pt.  Does not have a log of recent BP readings since Wednesday.  BP today 143/78-66. Pt has a history of chronic back pain.  Advised elevated BP could be r/t pain.  Reports did not receive last scheduled shoulder injection d/t  severe cold.   Advised pt to monitor BP daily 1.5-2 hours after taking BP medications.  Call in readings in about 1 week. If pt develops HA, SOB, or CP to call in sooner or call 911 if needed.    Pt had no further questions or concern.

## 2022-03-05 NOTE — Telephone Encounter (Signed)
Pt c/o BP issue: STAT if pt c/o blurred vision, one-sided weakness or slurred speech  1. What are your last 5 BP readings? Wednesday morning 167/102 HR 91, 155/103 HR 93, 147/77, 134/82 HR 90, 139/94 HR 99, this morning 143/78 HR 66  2. Are you having any other symptoms (ex. Dizziness, headache, blurred vision, passed out)? no  3. What is your BP issue? Patient states her BP has been very high. She says it started Wednesday when she woke up at 2 am throwing up a lot. She says she kept throwing up until 4:30 am. She says she had some soreness afterward and pain in her back, but that she has chronic back pain. She says she feels normal today, but her BP is still high.

## 2022-03-19 DIAGNOSIS — I10 Essential (primary) hypertension: Secondary | ICD-10-CM

## 2022-03-19 DIAGNOSIS — K219 Gastro-esophageal reflux disease without esophagitis: Secondary | ICD-10-CM

## 2022-03-19 DIAGNOSIS — Z9181 History of falling: Secondary | ICD-10-CM | POA: Insufficient documentation

## 2022-03-19 HISTORY — DX: History of falling: Z91.81

## 2022-03-19 HISTORY — DX: Gastro-esophageal reflux disease without esophagitis: K21.9

## 2022-03-19 HISTORY — DX: Essential (primary) hypertension: I10

## 2022-03-29 DIAGNOSIS — M79675 Pain in left toe(s): Secondary | ICD-10-CM | POA: Insufficient documentation

## 2022-03-29 DIAGNOSIS — I739 Peripheral vascular disease, unspecified: Secondary | ICD-10-CM | POA: Diagnosis not present

## 2022-03-29 DIAGNOSIS — M79674 Pain in right toe(s): Secondary | ICD-10-CM

## 2022-03-29 HISTORY — DX: Pain in right toe(s): M79.674

## 2022-04-02 ENCOUNTER — Ambulatory Visit: Payer: Medicare Other | Admitting: Cardiology

## 2022-04-02 NOTE — Progress Notes (Unsigned)
Office Visit    Patient Name: LAKEVIA PERRIS Date of Encounter: 04/03/2022  PCP:  Guerry Minors, [facility]  Cardiologist:  Freada Bergeron, MD  Advanced Practice Provider:  No care team member to display Electrophysiologist:  None   HPI    Amanda [name] is a 77 y.o. female with past medical history significant for CAD, PVD, hypertension, hyperlipidemia, GERD, and chronic back pain presents today for hospital follow-up.  Patient underwent stress echocardiogram October 2015 for chest pain and found to have electrocardiographic changes and possible anterior septal wall motion abnormality.  Cardiac catheterization November 2015 showed a 70% small nondominant right coronary artery and EF of 55 to 60%.  Medical therapy recommended.  Nuclear study December 2019 showed ejection fraction of 60%, poor quality study question small apical infarct and mild inferiobasilar ischemia.  Coronary CTA January 2020 showed calcium score of 53 and mild stenosis in the proximal LAD.  Treated medically.  She then underwent peripheral angiography on 08/01/2020 revealing high-grade segmental mid left SFA stenosis with two-vessel runoff, 50% mid right SFA stenosis.  Directional atherectomy was performed followed by drug-coated balloon angioplasty with excellent result.  Dopplers on 12/22 revealed normal ABIs bilaterally with a widely patent left SFA.  Recommend repeat Doppler studies in 1 year.  Echocardiogram 03/2021 with LVEF 65% and grade 1 DD.  She was last seen 07/2021 and had a sudden episode of lower back pain.  It felt sharp in nature and she was diaphoretic.  No shortness of breath or chest pain at the time.  She was preparing a meal and suddenly had back pain while turning.  Symptoms lasted for 3 minutes.  No associated palpitations.  She took hydrocodone with immediate relief.  Blood pressure and heart rate were normal.  Patient reported left shoulder pain with numbness  and tingling down the left arm.  She never had substernal chest pain or tightness.  No regular exercise but does gardening and walking to the mailbox.  She reported orthopedic neck issues.  Her symptoms were consistent with musculoskeletal etiology.  Recently seen in the ED 12/07/2021 with increased blood pressure.  She had not taken her blood pressure medications.  Recent steroid injection in her right shoulder the day previous.  Patient also reported intermittent atypical chest pain for some time.  Troponins were negative.  Given her home medications further treatment of her blood pressure.  Blood pressure improved.  She was last seen by me 12/19/21 and she told me that she was recently in the ED and her blood pressure was high.  She had COVID a year ago and is still dealing with symptoms.  She also states that she would like to stop her Repatha because she is having myalgias again in her legs.  She has had issues with several different cholesterol medicines.  She states she is only drinking maybe about 40 ounces of water daily.  She also has chronic back pain which contributes to her hypertension.  She is taking Lopressor 100 mg at night and HCTZ 25 mg in the morning.  When she went to the ED with hypertension she had not taken her morning dose of HCTZ.  Today, she comes in today with a BP log. She overall is feeling well other than cold feet. She states they take 1.5 hours to warm up at night. We reviewed recent testing of ABIs and and lower ext [place] which did not show any significant PAD.  We discussed increasing activity and I encouraged her to walk around her house every few hours to get the blood moving. Triglycerides high >200. Discussed limiting candy. She does not want to start any new medications at the moment. We will recheck in a few months.   Reports no shortness of breath nor dyspnea on exertion. Reports no chest pain, pressure, or tightness. No edema, orthopnea, PND. Reports no palpitations.      Past Medical History    Past Medical History:  Diagnosis Date   Anxiety    Arthritis    Back pain    CAD (coronary artery disease)    a. 01/2014: cath showing 70% stenosis of non-dominant RCA --> medically managed.    Cataract    bilat removed   DDD (degenerative disc disease), cervical    DDD (degenerative disc disease), lumbar    Depression    Diverticulosis    GERD (gastroesophageal reflux disease)    Headache    Hemorrhoids    Hiatal hernia    Hyperlipidemia    Hypertension    Melanoma ([place])    Orbital fracture ([place])    right   Osteopenia 07/2015   T score -2.0 FRAX 10%/1.4%   Osteoporosis 08/29/2015   Pituitary tumor    Stricture of esophagus    Vitamin D deficiency    Past Surgical History:  Procedure Laterality Date   ABDOMINAL AORTOGRAM W/LOWER EXTREMITY N/A 08/01/2020   Procedure: ABDOMINAL AORTOGRAM W/LOWER EXTREMITY;  Surgeon: Lorretta Harp, MD;  Location: [place] [facility];  Service: Cardiovascular;  Laterality: N/A;   CERVICAL DISC SURGERY     x 2   COLONOSCOPY     EXCISION OF SKIN CANCER     Melanoma   FOOT SURGERY Right    HEMORRHOID BANDING     X3   LEFT HEART CATHETERIZATION WITH CORONARY ANGIOGRAM N/A 02/03/2014   Procedure: LEFT HEART CATHETERIZATION WITH CORONARY ANGIOGRAM;  Surgeon: [name], MD;  Location: [facility] CATH [facility];  Service: Cardiovascular;  Laterality: N/A;   [place] SURGERY  2018   OOPHORECTOMY  2012   BSO   PELVIC LAPAROSCOPY  2012   Diag Lap-BSO-lysis of adhesions   PERIPHERAL VASCULAR INTERVENTION Left 08/01/2020   Procedure: PERIPHERAL VASCULAR INTERVENTION;  Surgeon: Lorretta Harp, MD;  Location: [place] [facility];  Service: Cardiovascular;  Laterality: Left;   TUBAL LIGATION     VAGINAL HYSTERECTOMY  1993    Allergies  Allergies  Allergen Reactions   Ciprofloxacin Rash   Lisinopril Swelling   Pneumococcal Vaccine Swelling   Pravastatin Other (See Comments)    Muscle aches    Crestor  [Rosuvastatin]     myalgias   Lipitor [Atorvastatin]     myalgias   Norvasc [Amlodipine]     swelling   Zetia [Ezetimibe]     "felt horrible"     EKGs/Labs/Other Studies Reviewed:   The following studies were reviewed today:  Echocardiogram 04/14/2021  IMPRESSIONS     1. Left ventricular ejection fraction, by estimation, is 60 to 65%. The  left ventricle has normal function. The left ventricle has no regional  wall motion abnormalities. There is severe left ventricular hypertrophy.  Left ventricular diastolic parameters   are consistent with Grade I diastolic dysfunction (impaired relaxation).   2. Right ventricular systolic function is normal. The right ventricular  size is normal.   3. Mobile / redundant possible PFO.   4. The mitral valve is normal in structure. No  evidence of mitral valve  regurgitation. No evidence of mitral stenosis.   5. The aortic valve is normal in structure. Aortic valve regurgitation is  mild. No aortic stenosis is present.   6. Aortic dilatation noted. There is mild dilatation of the aortic root,  measuring 38 mm. There is moderate dilatation of the ascending aorta,  measuring 42 mm.   7. The inferior vena cava is normal in size with greater than 50%  respiratory variability, suggesting right atrial pressure of 3 mmHg.   FINDINGS   Left Ventricle: Left ventricular ejection fraction, by estimation, is 60  to 65%. The left ventricle has normal function. The left ventricle has no  regional wall motion abnormalities. The left ventricular internal cavity  size was normal in size. There is   severe left ventricular hypertrophy. Left ventricular diastolic  parameters are consistent with Grade I diastolic dysfunction (impaired  relaxation).   Right Ventricle: The right ventricular size is normal. No increase in  right ventricular wall thickness. Right ventricular systolic function is  normal.   Left Atrium: Left atrial size was normal in size.    Right Atrium: Right atrial size was normal in size.   Pericardium: There is no evidence of pericardial effusion.   Mitral Valve: The mitral valve is normal in structure. No evidence of  mitral valve regurgitation. No evidence of mitral valve stenosis.   Tricuspid Valve: The tricuspid valve is normal in structure. Tricuspid  valve regurgitation is mild . No evidence of tricuspid stenosis.   Aortic Valve: The aortic valve is normal in structure. Aortic valve  regurgitation is mild. No aortic stenosis is present.   Pulmonic Valve: The pulmonic valve was normal in structure. Pulmonic valve  regurgitation is not visualized. No evidence of pulmonic stenosis.   Aorta: Aortic dilatation noted. There is mild dilatation of the aortic  root, measuring 38 mm. There is moderate dilatation of the ascending  aorta, measuring 42 mm.   Venous: The inferior vena cava is normal in size with greater than 50%  respiratory variability, suggesting right atrial pressure of 3 mmHg.   IAS/Shunts: The interatrial septum was not well visualized.  EKG:  EKG is not ordered today.  Recent Labs: 12/07/2021: BUN 13; Creatinine, Ser 0.92; Hemoglobin 13.5; Platelets 254; Potassium 3.3; Sodium 137  Recent Lipid Panel    Component Value Date/Time   CHOL 145 03/29/2021 1123   TRIG 212 (H) 03/29/2021 1123   HDL 39 (L) 03/29/2021 1123   CHOLHDL 3.7 03/29/2021 1123   LDLCALC 71 03/29/2021 1123     Home Medications   Current Meds  Medication Sig   AMBULATORY NON FORMULARY MEDICATION TAM herbal laxative Take 2 tablet by mouth every other day as needed   aspirin EC 81 MG tablet Take 81 mg by mouth daily. Swallow whole.   Cholecalciferol (VITAMIN D PO) Take 2,000 Units by mouth daily.   Cholecalciferol (VITAMIN D3) 50 MCG (2000 UT) TABS Take 2 tablets by mouth daily.   diclofenac Sodium (VOLTAREN) 1 % GEL Apply 2-4 g topically as needed.   diphenhydrAMINE HCl, Sleep, (SLEEP AID) 50 MG CAPS Take 50 mg by  mouth at bedtime.   fluticasone (FLONASE) 50 MCG/ACT nasal spray Place 1 spray into both nostrils daily as needed for allergies.   hydrochlorothiazide (HYDRODIURIL) 25 MG tablet Take 1 tablet (25 mg total) by mouth daily.   HYDROcodone-acetaminophen (NORCO) 10-325 MG tablet Take 1 tablet by mouth every 6 (six) hours as needed (Back pain).  LORazepam (ATIVAN) 0.5 MG tablet Take 0.5 mg by mouth daily as needed for anxiety.   losartan (COZAAR) 100 MG tablet Take 1 tablet (100 mg total) by mouth at bedtime.   pantoprazole (PROTONIX) 40 MG tablet TAKE ONE TABLET BY MOUTH EVERY DAY   Current Facility-Administered Medications for the 04/03/22 encounter (Office Visit) with Elgie Collard, PA-C  Medication   sodium chloride flush (NS) 0.9 % injection 3 mL     Review of Systems      All other systems reviewed and are otherwise negative except as noted above.  Physical Exam    VS:  BP 138/86   Pulse 77   Ht '5\' 5"'$  (1.651 m)   Wt 168 lb 12.8 oz (76.6 kg)   SpO2 95%   BMI 28.09 kg/m  , BMI Body mass index is 28.09 kg/m.  Wt Readings from Last 3 Encounters:  04/03/22 168 lb 12.8 oz (76.6 kg)  12/19/21 161 lb 9.6 oz (73.3 kg)  12/07/21 166 lb (75.3 kg)     GEN: Well nourished, well developed, in no acute distress. HEENT: normal. Neck: Supple, no JVD, carotid bruits, or masses. Cardiac: RRR, no murmurs, rubs, or gallops. No clubbing, cyanosis, edema.  Radials/PT 2+ and equal bilaterally.  Respiratory:  Respirations regular and unlabored, clear to auscultation bilaterally. GI: Soft, nontender, nondistended. MS: No deformity or atrophy. Skin: Warm and dry, no rash. Neuro:  Strength and sensation are intact. Psych: Normal affect.  Assessment & Plan    Hypertension -Remains elevated today -She is taking Losartan 100 mg daily at night, and HCTZ 25 mg in the morning.  -She brings in a log today and her BP has been well controlled  -She also has chronic back pain which contributes to her  hypertension (she is on hydrocodone daily) -Please track your BP twice a day and keep a log  Nonobstructive CAD -no chest pain -continue risk reduction with medication regimen  PVD/cold feet -She is not having any pain with walking but endorses cold feet -ABI and vascular US reviewed with the patient without any significant PAD  Hyperlipidemia -last dose of Repatha 9/21, she would like to switch to [employer] but insurance will not cover at this moment -Never started Nexletol '180mg'$  due to price -Follow-up lipid panel in three months       Disposition: Follow up 3 months with Freada Bergeron, MD or APP.  Signed, Elgie Collard, PA-C 04/03/2022, 2:44 PM  [facility]

## 2022-04-03 ENCOUNTER — Ambulatory Visit: Payer: Medicare Other | Attending: Cardiology | Admitting: Physician Assistant

## 2022-04-03 ENCOUNTER — Encounter: Payer: Self-pay | Admitting: Physician Assistant

## 2022-04-03 VITALS — BP 138/86 | HR 77 | Ht 65.0 in | Wt 168.8 lb

## 2022-04-03 DIAGNOSIS — I10 Essential (primary) hypertension: Secondary | ICD-10-CM | POA: Diagnosis not present

## 2022-04-03 DIAGNOSIS — I739 Peripheral vascular disease, unspecified: Secondary | ICD-10-CM | POA: Diagnosis not present

## 2022-04-03 DIAGNOSIS — E785 Hyperlipidemia, unspecified: Secondary | ICD-10-CM

## 2022-04-03 DIAGNOSIS — I2510 Atherosclerotic heart disease of native coronary artery without angina pectoris: Secondary | ICD-10-CM

## 2022-04-03 NOTE — Patient Instructions (Signed)
Medication Instructions:  Your physician recommends that you continue on your current medications as directed. Please refer to the Current Medication list given to you today.  *If you need a refill on your cardiac medications before your next appointment, please call your pharmacy*   Lab Work: Fasting lipid panel in 8 weeks If you have labs (blood work) drawn today and your tests are completely normal, you will receive your results only by: [place] (if you have MyChart) OR A paper copy in the mail If you have any lab test that is abnormal or we need to change your treatment, we will call you to review the results.   Follow-Up: At [facility], you and your health needs are our priority.  As part of our continuing mission to provide you with exceptional heart care, we have created designated Provider Care Teams.  These Care Teams include your primary Cardiologist (physician) and Advanced Practice Providers (APPs -  Physician Assistants and Nurse Practitioners) who all work together to provide you with the care you need, when you need it.  We recommend signing up for the patient portal called "MyChart".  Sign up information is provided on this After Visit Summary.  MyChart is used to connect with patients for Virtual Visits (Telemedicine).  Patients are able to view lab/test results, encounter notes, upcoming appointments, etc.  Non-urgent messages can be sent to your provider as well.   To learn more about what you can do with MyChart, go to [email or web address].    Your next appointment:   3 month(s)  Provider:   Freada Bergeron, MD    Other Instructions Check your blood pressure daily at home, one hour after taking your morning medications and keep a log.

## 2022-04-20 ENCOUNTER — Other Ambulatory Visit: Payer: Self-pay | Admitting: Interventional Cardiology

## 2022-04-26 DIAGNOSIS — H43813 Vitreous degeneration, bilateral: Secondary | ICD-10-CM | POA: Diagnosis not present

## 2022-04-30 DIAGNOSIS — Z79899 Other long term (current) drug therapy: Secondary | ICD-10-CM | POA: Diagnosis not present

## 2022-04-30 DIAGNOSIS — M961 Postlaminectomy syndrome, not elsewhere classified: Secondary | ICD-10-CM | POA: Diagnosis not present

## 2022-04-30 DIAGNOSIS — Z5181 Encounter for therapeutic drug level monitoring: Secondary | ICD-10-CM | POA: Diagnosis not present

## 2022-04-30 DIAGNOSIS — M5136 Other intervertebral disc degeneration, lumbar region: Secondary | ICD-10-CM | POA: Diagnosis not present

## 2022-04-30 DIAGNOSIS — M5030 Other cervical disc degeneration, unspecified cervical region: Secondary | ICD-10-CM | POA: Diagnosis not present

## 2022-04-30 DIAGNOSIS — M5412 Radiculopathy, cervical region: Secondary | ICD-10-CM | POA: Diagnosis not present

## 2022-05-07 DIAGNOSIS — R002 Palpitations: Secondary | ICD-10-CM | POA: Diagnosis not present

## 2022-05-07 DIAGNOSIS — E781 Pure hyperglyceridemia: Secondary | ICD-10-CM | POA: Diagnosis not present

## 2022-05-07 DIAGNOSIS — I10 Essential (primary) hypertension: Secondary | ICD-10-CM | POA: Diagnosis not present

## 2022-05-16 DIAGNOSIS — M7541 Impingement syndrome of right shoulder: Secondary | ICD-10-CM | POA: Diagnosis not present

## 2022-05-30 ENCOUNTER — Encounter: Payer: Self-pay | Admitting: Nurse Practitioner

## 2022-05-30 ENCOUNTER — Ambulatory Visit (INDEPENDENT_AMBULATORY_CARE_PROVIDER_SITE_OTHER): Payer: Medicare Other | Admitting: Nurse Practitioner

## 2022-05-30 VITALS — BP 128/88 | HR 73

## 2022-05-30 DIAGNOSIS — F32A Depression, unspecified: Secondary | ICD-10-CM | POA: Diagnosis not present

## 2022-05-30 DIAGNOSIS — R5383 Other fatigue: Secondary | ICD-10-CM | POA: Diagnosis not present

## 2022-05-30 DIAGNOSIS — R61 Generalized hyperhidrosis: Secondary | ICD-10-CM

## 2022-05-30 NOTE — Progress Notes (Signed)
   Acute Office Visit  Subjective:    Patient ID: Amanda [name], female    DOB: 02/26/1946, 77 y.o.   MRN: [id number]   HPI 77 y.o. G3P3003 presents today to discuss night sweats. Was on ERT in the past with good management, stopped in 2018. She does not remember why she stopped taking or if a provider recommended she stop. Tried patch but historical provider documented she had reaction to adhesive. She does not remember this. Vasomotor symptoms were tolerable for a while but feels they have worsened over the past year. Lost husband in 2021 and has been struggling with some depression. Reports trying a few SSRIs with no improvement. Complains of fatigue but feels this is somewhat getting better. Has had some weight gain. S/P 1993 TVH with subsequent BSO in 2012. H/O HTN, HLD. Normal TSH 05/07/22.    Review of Systems  Constitutional:  Positive for fatigue.  Endocrine: Positive for heat intolerance.       Objective:    Physical Exam Constitutional:      Appearance: Normal appearance.     BP 128/88   Pulse 73   SpO2 96%  Wt Readings from Last 3 Encounters:  04/03/22 168 lb 12.8 oz (76.6 kg)  12/19/21 161 lb 9.6 oz (73.3 kg)  12/07/21 166 lb (75.3 kg)        Patient informed chaperone available to be present for breast and/or pelvic exam. Patient has requested no chaperone to be present. Patient has been advised what will be completed during breast and pelvic exam.   Assessment & Plan:   Problem List Items Addressed This Visit   None Visit Diagnoses     Night sweats    -  Primary   Fatigue due to depression          Plan: Discussed vasomotor symptoms of menopause and less likely of this being related since her symptoms improved for years. Educated on risks of restarting ERT at this point, especially with history of hypertension and hyperlipidemia. I would consider low dose patch but unsure if she will tolerate adhesive. Provided OTC supplement options. Will return in a  couple of months for breast and pelvic exam and readdress then if symptoms are not better. Recommend grief therapy, getting outside now that weather is nice, spending time with friends and family, exercise to help cope with depression and energy levels.     Tamela Gammon DNP, 12:38 PM 05/30/2022

## 2022-05-31 DIAGNOSIS — L905 Scar conditions and fibrosis of skin: Secondary | ICD-10-CM | POA: Diagnosis not present

## 2022-05-31 DIAGNOSIS — D235 Other benign neoplasm of skin of trunk: Secondary | ICD-10-CM | POA: Diagnosis not present

## 2022-05-31 DIAGNOSIS — D239 Other benign neoplasm of skin, unspecified: Secondary | ICD-10-CM | POA: Diagnosis not present

## 2022-06-15 DIAGNOSIS — Z4802 Encounter for removal of sutures: Secondary | ICD-10-CM | POA: Diagnosis not present

## 2022-06-25 NOTE — Progress Notes (Unsigned)
Cardiology Office Note:    Date:  06/25/2022   ID:  Amanda [name], DOB 10/29/1945, MRN [id number]  PCP:  Amanda [name], Amanda [name]    [facility]  Cardiologist:  Amanda SpragueHeather E Daymen Hassebrock, MD  Advanced Practice Provider:  No care team member to display Electrophysiologist:  None    Referring MD: Amanda [name], Amanda [name]     History of Present Illness:    Amanda [name] is a [age] y.o. female with a hx of HTN, HLD, and known CAD (cath 2015 with 70% non-dominant RCA and recent CTA with prox LAD mild <50% stenosis) who was previously followed by Dr. Jens [name] who now presents to clinic for follow-up.   Patient underwent stress echocardiogram October 2015 for chest pain found to have electocardiographic changes and possible anteroapical wall motion abnormality. Cardiac catheterization November 2015 showed a 70% small nondominant right coronary artery and an ejection fraction of 55-65%. Medical therapy recommended. Nuclear study December 2019 showed ejection fraction 62% poor quality study question small apical infarct and mild inferobasilar ischemia.  Coronary CTA January 2020 showed calcium score 53 and mild stenosis proximal LAD.  Treated medically.  During our last visit on 04/18/20 where she was having episodes of shortness of breath. She was notably bradycardic during her visit with HR 52. We stopped her atenolol at that time. Symptoms also correlated with the recent loss of her husband. TTE was also obtained due to systolic murmur, which showed LVEF 60-65%, G1DD, normal RV, mildly dilated ascending aorta.  Was last seen in clinic on 03/2022 where she was stable from a CV standpoint. She declined starting new cholesterol therapy as she did not tolerate repatha due to myalgias. Insurance did not cover levquio. BP was well controlled at that visit.  Today, ***  Past Medical History:  Diagnosis Date   Anxiety    Arthritis    Back pain    CAD (coronary artery disease)    a. 01/2014:  cath showing 70% stenosis of non-dominant RCA --> medically managed.    Cataract    bilat removed   DDD (degenerative disc disease), cervical    DDD (degenerative disc disease), lumbar    Depression    Diverticulosis    GERD (gastroesophageal reflux disease)    Headache    Hemorrhoids    Hiatal hernia    Hyperlipidemia    Hypertension    Melanoma (HCC)    Orbital fracture (HCC)    right   Osteopenia 07/2015   T score -2.0 FRAX 10%/1.4%   Osteoporosis 08/29/2015   Pituitary tumor    Stricture of esophagus    Vitamin D deficiency     Past Surgical History:  Procedure Laterality Date   ABDOMINAL AORTOGRAM W/LOWER EXTREMITY N/A 08/01/2020   Procedure: ABDOMINAL AORTOGRAM W/LOWER EXTREMITY;  Surgeon: Amanda [name], Amanda J, MD;  Location: [facility];  Service: Cardiovascular;  Laterality: N/A;   CERVICAL DISC SURGERY     x 2   COLONOSCOPY     EXCISION OF SKIN CANCER     Melanoma   FOOT SURGERY Right    HEMORRHOID BANDING     X3   INCONTINENCE SURGERY  12/2020   LEFT HEART CATHETERIZATION WITH CORONARY ANGIOGRAM N/A 02/03/2014   Procedure: LEFT HEART CATHETERIZATION WITH CORONARY ANGIOGRAM;  Surgeon: Micheline ChapmanMichael D Cooper, MD;  [facility];  Service: Cardiovascular;  Laterality: N/A;   LUMBAR DISC SURGERY  2018   OOPHORECTOMY  2012   BSO   PELVIC  LAPAROSCOPY  2012   Diag Lap-BSO-lysis of adhesions   PERIPHERAL VASCULAR INTERVENTION Left 08/01/2020   Procedure: PERIPHERAL VASCULAR INTERVENTION;  Surgeon: Amanda [name], Amanda J, MD;  Location: [facility];  Service: Cardiovascular;  Laterality: Left;   TUBAL LIGATION     VAGINAL HYSTERECTOMY  1993    Current Medications: No outpatient medications have been marked as taking for the 07/04/22 encounter (Appointment) with Amanda SpraguePemberton, Amanda [name] E, MD.   Current Facility-Administered Medications for the 07/04/22 encounter (Appointment) with Amanda SpraguePemberton, Amanda [name] E, MD  Medication   sodium chloride flush (NS) 0.9 % injection  3 mL     Allergies:   Ciprofloxacin, Lisinopril, Pneumococcal vaccine, Pravastatin, Crestor [rosuvastatin], Lipitor [atorvastatin], Norvasc [amlodipine], Oxycodone, and Zetia [ezetimibe]   Social History   Socioeconomic History   Marital status: Widowed    Spouse name: Not on file   Number of children: 3   Years of education: Not on file   Highest education level: 10th grade  Occupational History   Occupation: Disabled/retired  Tobacco Use   Smoking status: Former    Years: 20    Types: Cigarettes    Quit date: 03/19/1994    Years since quitting: 28.2   Smokeless tobacco: Never  Vaping Use   Vaping Use: Never used  Substance and Sexual Activity   Alcohol use: No    Alcohol/week: 0.0 standard drinks of alcohol   Drug use: No   Sexual activity: Not Currently    Birth control/protection: Surgical    Comment: 1st intercourse 77 yo-Fewer than 5 partners,des neg  Other Topics Concern   Not on file  Social History Narrative   Lives with daughter    Some caffeine    Social Determinants of Health   Financial Resource Strain: Not on file  Food Insecurity: Not on file  Transportation Needs: Not on file  Physical Activity: Not on file  Stress: Not on file  Social Connections: Not on file     Family History: The patient's family history includes Cancer in her brother and father; Colon cancer in her brother, father, and sister; Colon polyps in her brother, father, and sister; Diabetes in her brother and sister; Heart disease in her brother, father, mother, sister, sister, and sister; Hypertension in her daughter, father, and mother; Melanoma in her brother, brother, and sister; Migraines in her daughter; Prostate cancer in her brother; Skin cancer in her brother, brother, daughter, daughter, and son; Stroke in her mother. There is no history of Esophageal cancer, Rectal cancer, Stomach cancer, or Breast cancer.  ROS:   Please see the history of present illness.    Review of  Systems  Constitutional:  Negative for chills and fever.  HENT:  Negative for hearing loss.   Eyes:  Negative for blurred vision and redness.  Respiratory:  Negative for shortness of breath.   Cardiovascular:  Negative for chest pain, palpitations, orthopnea, claudication, leg swelling and PND.  Gastrointestinal:  Negative for melena, nausea and vomiting.  Genitourinary:  Negative for dysuria and flank pain.  Musculoskeletal:  Positive for myalgias.  Neurological:  Negative for dizziness and loss of consciousness.  Endo/Heme/Allergies:  Negative for polydipsia.  Psychiatric/Behavioral:  Negative for substance abuse.     EKGs/Labs/Other Studies Reviewed:    The following studies were reviewed today: Myoview 2019: Myocardial perfusion is abnormal.  Findings consistent with ischemia and prior myocardial infarction.  This is an intermediate risk study.  Overall left ventricular systolic function was normal.    LV cavity  size is mildly enlarged.  Nuclear stress EF: 62%.  The left ventricular ejection fraction is normal (55-65%).   There is no prior study for comparison.   Coronary CTA 03/2018: FINDINGS: Non-cardiac: See separate report from [facility].   Pulmonary veins drain normally to the left atrium.   Calcium Score: 53 Agatston units.   Coronary Arteries: Left dominant with no anomalies   LM: Short, no plaque or stenosis.   LAD system: Mixed plaque in the proximal LAD with mild (<50%) stenosis.   Circumflex system: No plaque or stenosis.   RCA system: Small, nondominant vessel.  No plaque or stenosis.   IMPRESSION: 1.Coronary artery calcium score 53 Agatston units. This places the patient in the 64th percentile for age and gender, suggesting intermediate risk for future cardiac events.   2.  Nonobstructive disease in the proximal LAD.  TTE 05/06/20: IMPRESSIONS   1. Left ventricular ejection fraction, by estimation, is 60 to 65%. Left  ventricular ejection  fraction by 3D volume is 63 %. The left ventricle has  normal function. The left ventricle has no regional wall motion  abnormalities. Left ventricular diastolic   parameters are consistent with Grade I diastolic dysfunction (impaired  relaxation). The average left ventricular global longitudinal strain is  -24.3 %. The global longitudinal strain is normal.   2. Right ventricular systolic function is normal. The right ventricular  size is normal. There is normal pulmonary artery systolic pressure. The  estimated right ventricular systolic pressure is 28.8 mmHg.   3. The mitral valve is grossly normal. No evidence of mitral valve  regurgitation.   4. The aortic valve is tricuspid. Aortic valve regurgitation is trivial.  No aortic stenosis is present.   5. Aortic dilatation noted. There is mild dilatation of the aortic root,  measuring 42 mm. There is mild dilatation of the ascending aorta,  measuring 42 mm.    Recent Labs: 12/07/2021: BUN 13; Creatinine, Ser 0.92; Hemoglobin 13.5; Platelets 254; Potassium 3.3; Sodium 137  Recent Lipid Panel    Component Value Date/Time   CHOL 145 03/29/2021 1123   TRIG 212 (H) 03/29/2021 1123   HDL 39 (L) 03/29/2021 1123   CHOLHDL 3.7 03/29/2021 1123   LDLCALC 71 03/29/2021 1123     Risk Assessment/Calculations:       Physical Exam:    VS:  There were no vitals taken for this visit.    Wt Readings from Last 3 Encounters:  04/03/22 168 lb 12.8 oz (76.6 kg)  12/19/21 161 lb 9.6 oz (73.3 kg)  12/07/21 166 lb (75.3 kg)     GEN:  Well nourished, well developed in no acute distress HEENT: Normal NECK: No carotid bruits; normal JVD CARDIAC: RRR, 2/6 systolic murmur. No rubs, gallops RESPIRATORY:  Clear to auscultation without rales, wheezing or rhonchi  ABDOMEN: Soft, non-tender, non-distended MUSCULOSKELETAL:  No edema; No deformity  SKIN: Warm and dry NEUROLOGIC:  Alert and oriented x 3 PSYCHIATRIC:  Normal affect   ASSESSMENT:     No diagnosis found.  PLAN:    In order of problems listed above:  #Shortness of breath: Resolved. Cardiac work-up including coronary CTA and myoview reassuringly normal. Symptoms worsened since the loss of her husband, but having improved. Notably, we stopped her atenolol during last visit for HR 52 in case symptomatic bradycardia was contributing.  TTE 04/2020 with normal BiV function, G1DD, no significant valve disease. -Stopped atenolol -Continue losartan 100mg  daily -TTE with normal LVEF, no significant valve disease -Continue  to monitor   #Cardiac Murmur: 2/6 systolic murmur on exam. Normal EF on myoview. TTE without significant valvular disease. -TTE with normal LVEF, no significant valve disease  #Aortic root and ascending aorta dilation: Root measures 42mm and ascending aorta measures 42mm.  -Will need serial monitoring with yearly TTE -Continue blood pressure control as detailed below   #Mild non-obstructive CAD: CTA coronaries with mild <50% LAD disease; small non-dominant RCA. Calcium score 53 (64% for age and gender controls).  -Continue ASA 81mg  daily -Pending lipid panel, will start zetia due to intolerance to statins -Repeat lipids with LFTs next week   #HTN: *** -Stopped atenolol due to bradycardia as above -Continue HCTZ 25mg  in the AM -Continue losartan 100mg  in the afternoon    #HLD: Did not tolerate statins or repatha. Declined starting any new meds. Will continue with lifestyle modifications  #Left Leg Pain: -ABI without significant disease    Medication Adjustments/Labs and Tests Ordered: Current medicines are reviewed at length with the patient today.  Concerns regarding medicines are outlined above.  No orders of the defined types were placed in this encounter.  No orders of the defined types were placed in this encounter.   There are no Patient Instructions on file for this visit.    Signed, Amanda Sprague, MD  06/25/2022 9:22 PM     [facility]

## 2022-07-04 ENCOUNTER — Encounter: Payer: Self-pay | Admitting: Cardiology

## 2022-07-04 ENCOUNTER — Ambulatory Visit: Payer: Medicare Other | Attending: Cardiology | Admitting: Cardiology

## 2022-07-04 VITALS — BP 146/72 | HR 65 | Ht 65.0 in | Wt 167.4 lb

## 2022-07-04 DIAGNOSIS — E785 Hyperlipidemia, unspecified: Secondary | ICD-10-CM | POA: Diagnosis not present

## 2022-07-04 DIAGNOSIS — Z79899 Other long term (current) drug therapy: Secondary | ICD-10-CM

## 2022-07-04 DIAGNOSIS — I739 Peripheral vascular disease, unspecified: Secondary | ICD-10-CM

## 2022-07-04 DIAGNOSIS — I10 Essential (primary) hypertension: Secondary | ICD-10-CM | POA: Diagnosis not present

## 2022-07-04 DIAGNOSIS — R0602 Shortness of breath: Secondary | ICD-10-CM

## 2022-07-04 DIAGNOSIS — I2510 Atherosclerotic heart disease of native coronary artery without angina pectoris: Secondary | ICD-10-CM | POA: Diagnosis not present

## 2022-07-04 DIAGNOSIS — I77810 Thoracic aortic ectasia: Secondary | ICD-10-CM

## 2022-07-04 DIAGNOSIS — R011 Cardiac murmur, unspecified: Secondary | ICD-10-CM

## 2022-07-04 DIAGNOSIS — Z789 Other specified health status: Secondary | ICD-10-CM

## 2022-07-04 MED ORDER — SPIRONOLACTONE 25 MG PO TABS: 25.0000 mg | ORAL_TABLET | Freq: Every day | ORAL | 2 refills | Status: DC

## 2022-07-04 NOTE — Patient Instructions (Signed)
Medication Instructions:   START TAKING SPIRONOLACTONE 25 MG BY MOUTH DAILY  *If you need a refill on your cardiac medications before your next appointment, please call your pharmacy*   Lab Work:  IN ONE WEEK HERE IN THE OFFICE--BMET AND MAGNESIUM LEVEL  If you have labs (blood work) drawn today and your tests are completely normal, you will receive your results only by: MyChart Message (if you have MyChart) OR A paper copy in the mail If you have any lab test that is abnormal or we need to change your treatment, we will call you to review the results.   Testing/Procedures:  Your physician has requested that you have an echocardiogram. Echocardiography is a painless test that uses sound waves to create images of your heart. It provides your doctor with information about the size and shape of your heart and how well your heart's chambers and valves are working. This procedure takes approximately one hour. There are no restrictions for this procedure. Please do NOT wear cologne, perfume, aftershave, or lotions (deodorant is allowed). Please arrive 15 minutes prior to your appointment time.    Follow-Up: At [facility], you and your health needs are our priority.  As part of our continuing mission to provide you with exceptional heart care, we have created designated Provider Care Teams.  These Care Teams include your primary Cardiologist (physician) and Advanced Practice Providers (APPs -  Physician Assistants and Nurse Practitioners) who all work together to provide you with the care you need, when you need it.  We recommend signing up for the patient portal called "MyChart".  Sign up information is provided on this After Visit Summary.  MyChart is used to connect with patients for Virtual Visits (Telemedicine).  Patients are able to view lab/test results, encounter notes, upcoming appointments, etc.  Non-urgent messages can be sent to your provider as well.   To learn more  about what you can do with MyChart, go to [email or web address].    Your next appointment:   6 month(s)  Provider:   Meriam Sprague, MD

## 2022-07-11 ENCOUNTER — Other Ambulatory Visit: Payer: Self-pay | Admitting: *Deleted

## 2022-07-11 DIAGNOSIS — R0602 Shortness of breath: Secondary | ICD-10-CM

## 2022-07-11 DIAGNOSIS — I10 Essential (primary) hypertension: Secondary | ICD-10-CM

## 2022-07-11 DIAGNOSIS — I2510 Atherosclerotic heart disease of native coronary artery without angina pectoris: Secondary | ICD-10-CM

## 2022-07-11 DIAGNOSIS — Z79899 Other long term (current) drug therapy: Secondary | ICD-10-CM

## 2022-07-11 DIAGNOSIS — I77810 Thoracic aortic ectasia: Secondary | ICD-10-CM

## 2022-07-11 DIAGNOSIS — M79605 Pain in left leg: Secondary | ICD-10-CM

## 2022-07-11 DIAGNOSIS — E782 Mixed hyperlipidemia: Secondary | ICD-10-CM

## 2022-07-11 NOTE — Progress Notes (Signed)
Order for bmet and mg level placed in the system for [facility] to draw on the pt at [facility] in [place].  Orders placed  and released in the system for [facility] to draw on the pt today.

## 2022-07-12 LAB — BASIC METABOLIC PANEL
BUN/Creatinine Ratio: 11 — ABNORMAL LOW (ref 12–28)
BUN: 12 mg/dL (ref 8–27)
CO2: 24 mmol/L (ref 20–29)
Calcium: 9.3 mg/dL (ref 8.7–10.3)
Chloride: 99 mmol/L (ref 96–106)
Creatinine, Ser: 1.05 mg/dL — ABNORMAL HIGH (ref 0.57–1.00)
Glucose: 100 mg/dL — ABNORMAL HIGH (ref 70–99)
Potassium: 4.8 mmol/L (ref 3.5–5.2)
Sodium: 136 mmol/L (ref 134–144)
eGFR: 56 mL/min/{1.73_m2} — ABNORMAL LOW (ref >59)

## 2022-07-12 LAB — MAGNESIUM: Magnesium: 1.8 mg/dL (ref 1.6–2.3)

## 2022-07-17 ENCOUNTER — Other Ambulatory Visit: Payer: Self-pay

## 2022-07-17 MED ORDER — HYDROCHLOROTHIAZIDE 25 MG PO TABS: 25.0000 mg | ORAL_TABLET | Freq: Every day | ORAL | 3 refills | Status: DC

## 2022-07-19 ENCOUNTER — Ambulatory Visit: Payer: Medicare Other | Attending: Cardiology

## 2022-07-19 ENCOUNTER — Telehealth: Payer: Self-pay | Admitting: *Deleted

## 2022-07-19 DIAGNOSIS — I77810 Thoracic aortic ectasia: Secondary | ICD-10-CM | POA: Diagnosis not present

## 2022-07-19 LAB — ECHOCARDIOGRAM COMPLETE: S' Lateral: 3.5 cm

## 2022-07-19 NOTE — Telephone Encounter (Signed)
The patient has been notified of the result and verbalized understanding.  All questions (if any) were answered.  Pt aware that we will order for her to get another echo done for surveillance in one year.  She is aware that she will get a call back from our echo scheduler to get this arranged.  Pt verbalized understanding and agrees with this plan.

## 2022-07-19 NOTE — Telephone Encounter (Signed)
-----   Message from Meriam Sprague, MD sent at 07/19/2022  1:53 PM EDT ----- Her echo shows normal pumping function. Her aorta is stable in size at 42mm. Will continue with yearly monitoring.

## 2022-07-23 DIAGNOSIS — K219 Gastro-esophageal reflux disease without esophagitis: Secondary | ICD-10-CM | POA: Diagnosis not present

## 2022-07-23 DIAGNOSIS — F419 Anxiety disorder, unspecified: Secondary | ICD-10-CM | POA: Diagnosis not present

## 2022-07-23 DIAGNOSIS — I10 Essential (primary) hypertension: Secondary | ICD-10-CM | POA: Diagnosis not present

## 2022-07-23 DIAGNOSIS — Z79899 Other long term (current) drug therapy: Secondary | ICD-10-CM | POA: Diagnosis not present

## 2022-08-16 NOTE — Progress Notes (Signed)
08/17/2022 Amanda [name] [id number] March 30, 1945  Referring provider: [name], FNP Primary GI doctor: Dr. [name]  ASSESSMENT AND PLAN:   77 year old female with postprandial epigastric pain, reflux, dysphagia, nausea and vomiting, thankfully has not had weight loss but has had some weight gain. With patient's symptoms will get CT abdomen and pelvis to evaluate pancreas, abdomen Will increase Protonix 40 mg twice daily, discussed taking prior to food, given Zofran to take as needed for nausea Will plan on EGD with possible dilation to evaluate for esophagitis, stenosis, malignancy, GERD, peptic ulcer disease. Will repeat CBC and c-Met especially since getting CT. Will schedule EGD. I discussed risks of EGD with patient today, including risk of sedation, bleeding or perforation.  Patient provides understanding and gave verbal consent to proceed. If endoscopy and CT scans unremarkable, most likely this could be related to gastroparesis secondary to opioid use.  Gastroparesis diet discussed with patient.   Chronic idiopathic constipation secondary to chronic opioid use Patient takes as needed laxatives, and fiber. Has failed MiraLAX and laxatives help occasionally. Has done well on Linzess in the past but insurance would not cover it. From insurance/EMR appears Amitiza is better covered, sent in Amitiza 24 mcg twice daily. Last colonoscopy 2017, patient has strong family history of colon cancer in father and sister, with change in bowel habits/worsening constipation will plan for colonoscopy in [facility] with endoscopy. We have discussed the risks of bleeding, infection, perforation, medication reactions, and remote risk of death associated with colonoscopy. All questions were answered and the patient acknowledges these risk and wishes to proceed.  Patient with history of CAD nonobstructing disease, most recent echocardiogram 2024 normal EF, No aortic stenosis, patient has history of PAD  was on Plavix but currently only on low-dose aspirin. Patient appropriate for [facility]   Has failed miralax, laxatives, fiber.   Patient Care Team: [name], FNP as PCP - General (Internal Medicine) Meriam Sprague, MD as PCP - Cardiology (Cardiology) Runell Gess, MD as PCP - [facility] (Cardiology) Sheran Luz, MD as Consulting Physician (Physical Medicine and Rehabilitation)  HISTORY OF PRESENT ILLNESS: 77 y.o. female with a past medical history of family history of colon cancer in father and sister, diverticulosis, chronic opioid-induced constipation history, chronic reflux, CAD mild nonobstructing disease coronary CT 03/2018, echocardiogram 07/2022 grade 1 diastolic dysfunction, EF 60 to 65%, ascending aortic dilation, hypertension, hyperlipidemia, PAD followed by Dr. Allyson Sabal, on bASA and others listed below presents for evaluation of abdominal pain, nausea and constipation.   2017 colonoscopy for family history of colon cancer in father and sister Preparation normal other than left-sided diverticulosis 04/18/2021 office visit with Dr. [name] for colon cancer screening, patient also has chronic reflux and intermittent dysphagia with breads, was complaining at office visit of DOE and suggested evaluation with cardiology for repeat echocardiogram, which patient had 07/19/2022 which was virtually unchanged.  Planning on EGD and colonoscopy.  However these were not completed, patient was sick and canceled this.   She states she stays nausea, worse after eating with associated bloating and upper AB pain.  She has GERD, hoarseness, she is on protonix once a day before food most of the time, occ she will not.  Worse with milk/corn bread and ice cream, can have vomiting after this.  She states she has gained about 20 lbs in the last year but has had decreased appetite.  She has chronic constipation, take OTC fiber and occ laxative, she has BM every other day ,  feels incomplete BM.   She is on the hydrocodone 4-5 x a day for her back.  She has occ dark/black stool, she is on iron, no pepto. No tarry appearing stools.  She still mows her yard and weeds her yard, she has SOB with these activities but no chest pain. Recovers quickly with rest.   She denies blood thinner use.  She denies NSAID use.  She denies ETOH use.   She denies tobacco use.  She denies drug use.    She  reports that she quit smoking about 28 years ago. Her smoking use included cigarettes. She has never used smokeless tobacco. She reports that she does not drink alcohol and does not use drugs.  RELEVANT LABS AND IMAGING: CBC    Component Value Date/Time   WBC 5.0 12/07/2021 1018   RBC 4.19 12/07/2021 1018   HGB 13.5 12/07/2021 1018   HGB 13.4 07/26/2020 1112   HCT 39.3 12/07/2021 1018   HCT 39.8 07/26/2020 1112   PLT 254 12/07/2021 1018   PLT 262 07/26/2020 1112   MCV 93.8 12/07/2021 1018   MCV 96 07/26/2020 1112   MCH 32.2 12/07/2021 1018   MCHC 34.4 12/07/2021 1018   RDW 12.0 12/07/2021 1018   RDW 12.7 07/26/2020 1112   LYMPHSABS 1.6 04/18/2021 1151   MONOABS 0.4 04/18/2021 1151   EOSABS 0.2 04/18/2021 1151   BASOSABS 0.0 04/18/2021 1151   Recent Labs    12/07/21 1018  HGB 13.5    CMP     Component Value Date/Time   NA 136 07/11/2022 1453   K 4.8 07/11/2022 1453   CL 99 07/11/2022 1453   CO2 24 07/11/2022 1453   GLUCOSE 100 (H) 07/11/2022 1453   GLUCOSE 113 (H) 12/07/2021 1018   BUN 12 07/11/2022 1453   CREATININE 1.05 (H) 07/11/2022 1453   CREATININE 0.72 05/30/2015 1140   CALCIUM 9.3 07/11/2022 1453   PROT 6.5 03/29/2021 1123   ALBUMIN 4.4 03/29/2021 1123   AST 19 03/29/2021 1123   ALT 6 03/29/2021 1123   ALKPHOS 84 03/29/2021 1123   BILITOT 0.3 03/29/2021 1123   GFRNONAA >60 12/07/2021 1018   GFRNONAA 75 01/29/2014 1509   GFRAA 84 04/28/2020 1159   GFRAA 86 01/29/2014 1509      Latest Ref Rng & Units 03/29/2021   11:23 AM 12/15/2020    9:35 AM 07/14/2020    10:55 AM  Hepatic Function  Total Protein 6.0 - 8.5 g/dL 6.5  6.2  6.8   Albumin 3.7 - 4.7 g/dL 4.4  4.3  4.5   AST 0 - 40 IU/L 19  18  23    ALT 0 - 32 IU/L 6  8  12    Alk Phosphatase 44 - 121 IU/L 84  81  100   Total Bilirubin 0.0 - 1.2 mg/dL 0.3  0.3  0.5   Bilirubin, Direct 0.00 - 0.40 mg/dL  4.09  8.11       Current Medications:     Current Outpatient Medications (Cardiovascular):    hydrochlorothiazide (HYDRODIURIL) 25 MG tablet, Take 1 tablet (25 mg total) by mouth daily.   losartan (COZAAR) 100 MG tablet, Take 1 tablet (100 mg total) by mouth at bedtime.   spironolactone (ALDACTONE) 25 MG tablet, Take 1 tablet (25 mg total) by mouth daily.   Current Outpatient Medications (Respiratory):    fluticasone (FLONASE) 50 MCG/ACT nasal spray, Place 1 spray into both nostrils daily as needed for allergies.   Current  Outpatient Medications (Analgesics):    aspirin EC 81 MG tablet, Take 81 mg by mouth daily. Swallow whole.   HYDROcodone-acetaminophen (NORCO) 10-325 MG tablet, Take 1 tablet by mouth every 6 (six) hours as needed (Back pain).     Current Outpatient Medications (Other):    AMBULATORY NON FORMULARY MEDICATION, TAM herbal laxative Take 2 tablet by mouth every other day as needed   Cholecalciferol (VITAMIN D3) 50 MCG (2000 UT) TABS, Take 2 tablets by mouth daily.   diclofenac Sodium (VOLTAREN) 1 % GEL, Apply 2-4 g topically as needed.   diphenhydrAMINE HCl, Sleep, (SLEEP AID) 50 MG CAPS, Take 50 mg by mouth at bedtime.   LORazepam (ATIVAN) 0.5 MG tablet, Take 0.5 mg by mouth daily as needed for anxiety.   lubiprostone (AMITIZA) 24 MCG capsule, Take 1 capsule (24 mcg total) by mouth 2 (two) times daily with a meal.   Na Sulfate-K Sulfate-Mg Sulf 17.5-3.13-1.6 GM/177ML SOLN, Take 1 kit by mouth once for 1 dose.   ondansetron (ZOFRAN) 4 MG tablet, Take 1 tablet (4 mg total) by mouth every 8 (eight) hours as needed for nausea or vomiting.   pantoprazole (PROTONIX) 40 MG  tablet, Take 1 tablet (40 mg total) by mouth 2 (two) times daily before a meal.  Current Facility-Administered Medications (Other):    sodium chloride flush (NS) 0.9 % injection 3 mL  Medical History:  Past Medical History:  Diagnosis Date   Anxiety    Arthritis    Back pain    CAD (coronary artery disease)    a. 01/2014: cath showing 70% stenosis of non-dominant RCA --> medically managed.    Cataract    bilat removed   DDD (degenerative disc disease), cervical    DDD (degenerative disc disease), lumbar    Depression    Diverticulosis    GERD (gastroesophageal reflux disease)    Headache    Hemorrhoids    Hiatal hernia    Hyperlipidemia    Hypertension    Melanoma (HCC)    Orbital fracture (HCC)    right   Osteopenia 07/2015   T score -2.0 FRAX 10%/1.4%   Osteoporosis 08/29/2015   Pituitary tumor    Stricture of esophagus    Vitamin D deficiency    Allergies:  Allergies  Allergen Reactions   Ciprofloxacin Rash   Lisinopril Swelling   Pneumococcal Vaccine Swelling   Pravastatin Other (See Comments)    Muscle aches    Crestor [Rosuvastatin]     myalgias   Evolocumab Other (See Comments)    cramping   Lipitor [Atorvastatin]     myalgias   Norvasc [Amlodipine]     swelling   Oxycodone Other (See Comments)   Zetia [Ezetimibe]     "felt horrible"     Surgical History:  She  has a past surgical history that includes Vaginal hysterectomy (1993); EXCISION OF SKIN CANCER; HEMORRHOID BANDING; Tubal ligation; Pelvic laparoscopy (2012); Oophorectomy (2012); Foot surgery (Right); left heart catheterization with coronary angiogram (N/A, 02/03/2014); Colonoscopy; Cervical disc surgery; Lumbar disc surgery (2018); ABDOMINAL AORTOGRAM W/LOWER EXTREMITY (N/A, 08/01/2020); PERIPHERAL VASCULAR INTERVENTION (Left, 08/01/2020); and Incontinence surgery (12/2020). Family History:  Her family history includes Cancer in her brother and father; Colon cancer in her brother, father,  and sister; Colon polyps in her brother, father, and sister; Diabetes in her brother and sister; Heart disease in her brother, father, mother, sister, sister, and sister; Hypertension in her daughter, father, and mother; Melanoma in her brother, brother, and sister;  Migraines in her daughter; Prostate cancer in her brother; Skin cancer in her brother, brother, daughter, daughter, and son; Stroke in her mother.  REVIEW OF SYSTEMS  :  Review of Systems  Constitutional:  Negative for chills, fever and weight loss.  Respiratory:  Positive for shortness of breath. Negative for cough and wheezing.   Cardiovascular:  Negative for chest pain and leg swelling.  Gastrointestinal:  Positive for abdominal pain, constipation, heartburn, melena (on iron), nausea and vomiting. Negative for blood in stool and diarrhea.  Musculoskeletal:  Positive for back pain.  Skin:  Negative for rash.  Neurological:  Negative for focal weakness.    PHYSICAL EXAM: BP 138/82 (BP Location: Left Arm, Patient Position: Sitting, Cuff Size: Normal)   Pulse 85   Ht 5\' 5"  (1.651 m)   Wt 169 lb (76.7 kg)   SpO2 92%   BMI 28.12 kg/m  General Appearance: Well nourished, in no apparent distress. Head:   Normocephalic and atraumatic. Eyes:  sclerae anicteric,conjunctive pink  Respiratory: Respiratory effort normal, BS equal bilaterally without rales, rhonchi, wheezing. Cardio: RRR with no MRGs. Peripheral pulses intact.  Abdomen: Soft,  Obese ,active bowel sounds. moderate tenderness in the epigastrium. With guarding and Without rebound. No masses. Rectal: Not evaluated Musculoskeletal: Full ROM, Normal gait. Without edema. Skin:  Dry and intact without significant lesions or rashes Neuro: Alert and  oriented x4;  No focal deficits. Psych:  Cooperative. Normal mood and affect.    Doree Albee, PA-C 12:24 PM

## 2022-08-17 ENCOUNTER — Ambulatory Visit: Payer: Medicare Other | Admitting: Physician Assistant

## 2022-08-17 ENCOUNTER — Encounter: Payer: Self-pay | Admitting: Physician Assistant

## 2022-08-17 ENCOUNTER — Other Ambulatory Visit (INDEPENDENT_AMBULATORY_CARE_PROVIDER_SITE_OTHER): Payer: Medicare Other

## 2022-08-17 VITALS — BP 138/82 | HR 85 | Ht 65.0 in | Wt 169.0 lb

## 2022-08-17 DIAGNOSIS — K219 Gastro-esophageal reflux disease without esophagitis: Secondary | ICD-10-CM | POA: Diagnosis not present

## 2022-08-17 DIAGNOSIS — R112 Nausea with vomiting, unspecified: Secondary | ICD-10-CM

## 2022-08-17 DIAGNOSIS — R1013 Epigastric pain: Secondary | ICD-10-CM

## 2022-08-17 DIAGNOSIS — R140 Abdominal distension (gaseous): Secondary | ICD-10-CM | POA: Diagnosis not present

## 2022-08-17 DIAGNOSIS — R1319 Other dysphagia: Secondary | ICD-10-CM | POA: Diagnosis not present

## 2022-08-17 DIAGNOSIS — K5904 Chronic idiopathic constipation: Secondary | ICD-10-CM

## 2022-08-17 LAB — CBC WITH DIFFERENTIAL/PLATELET
Basophils Absolute: 0 10*3/uL (ref 0.0–0.1)
Basophils Relative: 0.6 % (ref 0.0–3.0)
Eosinophils Absolute: 0.1 10*3/uL (ref 0.0–0.7)
Eosinophils Relative: 1.8 % (ref 0.0–5.0)
HCT: 41.2 % (ref 36.0–46.0)
Hemoglobin: 13.8 g/dL (ref 12.0–15.0)
Lymphocytes Relative: 29.4 % (ref 12.0–46.0)
Lymphs Abs: 1.5 10*3/uL (ref 0.7–4.0)
MCHC: 33.4 g/dL (ref 30.0–36.0)
MCV: 96.3 fl (ref 78.0–100.0)
Monocytes Absolute: 0.5 10*3/uL (ref 0.1–1.0)
Monocytes Relative: 9.4 % (ref 3.0–12.0)
Neutro Abs: 3 10*3/uL (ref 1.4–7.7)
Neutrophils Relative %: 58.8 % (ref 43.0–77.0)
Platelets: 264 10*3/uL (ref 150.0–400.0)
RBC: 4.28 Mil/uL (ref 3.87–5.11)
RDW: 13.7 % (ref 11.5–15.5)
WBC: 5.1 10*3/uL (ref 4.0–10.5)

## 2022-08-17 LAB — COMPREHENSIVE METABOLIC PANEL
ALT: 15 U/L (ref 0–35)
AST: 22 U/L (ref 0–37)
Albumin: 4.4 g/dL (ref 3.5–5.2)
Alkaline Phosphatase: 63 U/L (ref 39–117)
BUN: 14 mg/dL (ref 6–23)
CO2: 30 mEq/L (ref 19–32)
Calcium: 9 mg/dL (ref 8.4–10.5)
Chloride: 103 mEq/L (ref 96–112)
Creatinine, Ser: 1.03 mg/dL (ref 0.40–1.20)
GFR: 53.54 mL/min — ABNORMAL LOW (ref >60.00)
Glucose, Bld: 70 mg/dL (ref 70–99)
Potassium: 4.1 mEq/L (ref 3.5–5.1)
Sodium: 139 mEq/L (ref 135–145)
Total Bilirubin: 0.5 mg/dL (ref 0.2–1.2)
Total Protein: 7.3 g/dL (ref 6.0–8.3)

## 2022-08-17 MED ORDER — NA SULFATE-K SULFATE-MG SULF 17.5-3.13-1.6 GM/177ML PO SOLN: 1.0000 | Freq: Once | ORAL | 0 refills | Status: AC

## 2022-08-17 MED ORDER — ONDANSETRON HCL 4 MG PO TABS: 4.0000 mg | ORAL_TABLET | Freq: Three times a day (TID) | ORAL | 0 refills | Status: DC | PRN

## 2022-08-17 MED ORDER — PANTOPRAZOLE SODIUM 40 MG PO TBEC: 40.0000 mg | DELAYED_RELEASE_TABLET | Freq: Two times a day (BID) | ORAL | 0 refills | Status: DC

## 2022-08-17 MED ORDER — LUBIPROSTONE 24 MCG PO CAPS: 24.0000 ug | ORAL_CAPSULE | Freq: Two times a day (BID) | ORAL | 0 refills | Status: AC

## 2022-08-17 NOTE — Patient Instructions (Addendum)
Amitiza 24 mcg *IBS-C patients may begin to experience relief from belly pain and overall abdominal symptoms (pain, discomfort, and bloating) in about 1 week,  with symptoms typically improving over 12 weeks.  Your provider has requested that you go to the basement level for lab work before leaving today. Press "B" on the elevator. The lab is located at the first door on the left as you exit the elevator.   Try the amitiza take it twice a day with food You can have a loose stool if you eat a high-fat breakfast. Give it at least 7 days, may have more bowel movements during that time.   The diarrhea should go away and you should start having normal, complete, full bowel movements.  It may be helpful to start treatment when you can be near the comfort of your own bathroom, such as a weekend.  After you are out we can send in a prescription if you did well, there is a prescription card  Please take your proton pump inhibitor medication, increase to twice a day  Please take this medication 30 minutes to 1 hour before meals- this makes it more effective.  Avoid spicy and acidic foods Avoid fatty foods Limit your intake of coffee, tea, alcohol, and carbonated drinks Work to maintain a healthy weight Keep the head of the bed elevated at least 3 inches with blocks or a wedge pillow if you are having any nighttime symptoms Stay upright for 2 hours after eating Avoid meals and snacks three to four hours before bedtime  You will be contacted by [facility] Scheduling in the next 2 days to arrange a CT Abdomen/Pelvis.  The number on your caller ID will be [phone], please answer when they call.  If you have not heard from them in 2 days please call [phone] to schedule.     You have been scheduled for an endoscopy and colonoscopy. Please follow the written instructions given to you at your visit today. Please pick up your prep supplies at the pharmacy within the next 1-3 days. If you  use inhalers (even only as needed), please bring them with you on the day of your procedure.    I appreciate the  opportunity to care for you  Thank You   [facility]

## 2022-08-24 ENCOUNTER — Telehealth: Payer: Self-pay | Admitting: Physician Assistant

## 2022-08-24 NOTE — Telephone Encounter (Signed)
Inbound call from patient stating the medication Amitiza that she was prescribed on 5/31 has made her very sick. States she has been vomiting due to medication. Also states it is expensive for her and she does not want to proceed with taking medication. Requesting a call back to discuss if there is an alternative she could take. Please advise, thank you.

## 2022-08-27 ENCOUNTER — Encounter: Payer: Self-pay | Admitting: Internal Medicine

## 2022-08-27 ENCOUNTER — Ambulatory Visit (INDEPENDENT_AMBULATORY_CARE_PROVIDER_SITE_OTHER): Payer: Medicare Other | Admitting: Internal Medicine

## 2022-08-27 VITALS — BP 130/74 | HR 75 | Temp 98.4°F | Ht 65.0 in | Wt 169.0 lb

## 2022-08-27 DIAGNOSIS — R110 Nausea: Secondary | ICD-10-CM | POA: Diagnosis not present

## 2022-08-27 DIAGNOSIS — I10 Essential (primary) hypertension: Secondary | ICD-10-CM

## 2022-08-27 DIAGNOSIS — E162 Hypoglycemia, unspecified: Secondary | ICD-10-CM | POA: Diagnosis not present

## 2022-08-27 LAB — POCT GLYCOSYLATED HEMOGLOBIN (HGB A1C): Hemoglobin A1C: 5.4 % (ref 4.0–5.6)

## 2022-08-27 NOTE — Assessment & Plan Note (Signed)
Well controlled  Continue Losartan 100mg  and HCTZ 25mg 

## 2022-08-27 NOTE — Telephone Encounter (Signed)
Stop Amitiza, can give samples of Linzess 72 mcg here and can try and redo this with her insurance that she had success with in the past.  If not can do miralax 17 grams twice a day Miralax is an osmotic laxative.  It only brings more water into the stool.  This is safe to take daily.  Mix with juice or coffee.  Reassess your response in 3-4 days.  You can increase and decrease the dose based on your response.  Remember, it can take up to 3-4 days to take effect OR for the effects to wear off.   I often pair this with benefiber in the morning to help assure the stool is not too loose.   If MiraLAX does not help can try lactulose 15 mL twice daily for constipation.

## 2022-08-27 NOTE — Patient Instructions (Signed)
Eat high protein, frequent meals throughout the day to avoid blood sugar dropping.

## 2022-08-27 NOTE — Telephone Encounter (Signed)
Pt did not want to try a new prescription med. Discussed with pt that she can take miralax twice a day otc for constipation. Pt verbalized understanding.

## 2022-08-27 NOTE — Assessment & Plan Note (Signed)
High protein foods  Frequent small meals throughout the day to avoid low blood sugar

## 2022-08-27 NOTE — Progress Notes (Signed)
[facility] [address] [place] [address] Dept: [phone] Dept Fax: [phone]  New Patient Office Visit  Subjective:   BESS VANWAGENEN January 28, 1946 08/27/2022  No chief complaint on file.   HPI: DAYLIA HARTIN presents today to establish care at [facility]. Patient was seen at prior PCP location [facility]. This is not PCP's first encounter with patient.   BLOOD SUGAR and NAUSEA CONCERNS: Patient reports she gets shaky, nausea, and sometimes gets diaphoretic. Ongoing for "a long time." Symptoms come in morning or afternoons. She states she will go eat a spoon of peanut butter and then symptoms resolve.   She reports chronic nausea ongoing for awhile. She has been evaluated by GI, is scheduled for upcoming Endo/colon and CT scan for evaluation. She is concerned her nausea could be related to her anxiety. She states when she takes her ativan, the nausea does seem to subside.   HYPERTENSION: CHONITA LINDBLOM presents for the medical management of hypertension.  Patient's current hypertension medication regimen is: Losartan 100mg  , HCTZ 25mg  Patient is  currently taking prescribed medications for HTN.  Denies headache, CP, SHOB, vision changes.   BP Readings from Last 3 Encounters:  08/27/22 130/74  08/17/22 138/82  07/04/22 (!) 146/72     The following portions of the patient's history were reviewed and updated as appropriate: past medical history, past surgical history, family history, social history, allergies, medications, and problem list.   Patient Active Problem List   Diagnosis Date Noted   Hypoglycemia 08/27/2022   Shoulder pain, left 08/31/2021   Pituitary adenoma (HCC) 03/31/2021   Pituitary mass (HCC) 02/14/2021   Mixed stress and urge urinary incontinence 11/29/2020   Sensorineural hearing loss (SNHL) of both ears 10/13/2020   Hyperlipidemia 08/02/2020   PVD  (peripheral vascular disease) (HCC) 08/01/2020   Peripheral arterial disease (HCC) 07/26/2020   Basal cell carcinoma (BCC) of right lower leg 06/06/2020   Major depressive disorder with single episode, in full remission (HCC) 01/22/2020   Statin intolerance 01/26/2019   Insomnia due to other mental disorder 12/30/2018   Basal cell carcinoma (BCC) of left ala nasi 09/12/2018   Lumbar spondylosis 12/26/2017   Degeneration of lumbar intervertebral disc 07/09/2017   Lumbar post-laminectomy syndrome 07/09/2017   Herniated lumbar intervertebral disc 11/07/2016   Chronic right sacroiliac joint pain 06/08/2016   Chronic right-sided low back pain with right-sided sciatica 06/08/2016   Impingement syndrome of right shoulder 03/08/2016   Anxiety 08/29/2015   Osteoporosis 08/29/2015   CAD (coronary artery disease) 04/01/2014   Abnormal stress echocardiogram 01/29/2014   Dyspnea 01/04/2014   Essential hypertension    Melanoma (HCC)    Stricture of esophagus    Diverticulosis    Vitamin D deficiency    Hiatal hernia    Hemorrhoids    Arthritis    GERD (gastroesophageal reflux disease)    Osteopenia    Back pain 02/05/2011   Family history of malignant neoplasm of gastrointestinal tract 08/21/2010   Dysphagia, unspecified(787.20) 08/21/2010   ESOPHAGEAL REFLUX 01/11/2009   Past Medical History:  Diagnosis Date   Anxiety    Arthritis    Back pain    CAD (coronary artery disease)    a. 01/2014: cath showing 70% stenosis of non-dominant RCA --> medically managed.    Cataract    bilat removed   Chest pain 07/22/2014   DDD (degenerative disc disease), cervical    DDD (degenerative disc  disease), lumbar    Depression    Diverticulosis    GERD (gastroesophageal reflux disease)    Headache    Hemorrhoids    Hiatal hernia    Hyperlipidemia    Hypertension    Melanoma (HCC)    Orbital fracture (HCC)    right   Osteopenia 07/2015   T score -2.0 FRAX 10%/1.4%   Osteoporosis 08/29/2015    Pituitary tumor    Stricture of esophagus    Vitamin D deficiency    Past Surgical History:  Procedure Laterality Date   ABDOMINAL AORTOGRAM W/LOWER EXTREMITY N/A 08/01/2020   Procedure: ABDOMINAL AORTOGRAM W/LOWER EXTREMITY;  Surgeon: Runell Gess, MD;  Location: [facility] INVASIVE CV LAB;  Service: Cardiovascular;  Laterality: N/A;   CERVICAL DISC SURGERY     x 2   COLONOSCOPY     EXCISION OF SKIN CANCER     Melanoma   FOOT SURGERY Right    HEMORRHOID BANDING     X3   INCONTINENCE SURGERY  12/2020   LEFT HEART CATHETERIZATION WITH CORONARY ANGIOGRAM N/A 02/03/2014   Procedure: LEFT HEART CATHETERIZATION WITH CORONARY ANGIOGRAM;  Surgeon: Micheline Chapman, MD;  Location: [facility] CATH LAB;  Service: Cardiovascular;  Laterality: N/A;   LUMBAR DISC SURGERY  2018   OOPHORECTOMY  2012   BSO   PELVIC LAPAROSCOPY  2012   Diag Lap-BSO-lysis of adhesions   PERIPHERAL VASCULAR INTERVENTION Left 08/01/2020   Procedure: PERIPHERAL VASCULAR INTERVENTION;  Surgeon: Runell Gess, MD;  Location: [facility] INVASIVE CV LAB;  Service: Cardiovascular;  Laterality: Left;   TUBAL LIGATION     VAGINAL HYSTERECTOMY  1993   Family History  Problem Relation Age of Onset   Heart disease Mother    Hypertension Mother    Stroke Mother    Colon polyps Father    Heart disease Father        CHF   Hypertension Father    Cancer Father        COLON   Colon cancer Father    Colon cancer Sister    Colon polyps Sister    Heart disease Sister    Diabetes Sister    Melanoma Sister    Heart disease Sister    Heart disease Sister    Skin cancer Daughter    Hypertension Daughter    Skin cancer Daughter    Migraines Daughter    Colon polyps Brother    Diabetes Brother    Heart disease Brother    Cancer Brother        COLON   Melanoma Brother    Colon cancer Brother    Melanoma Brother    Skin cancer Brother    Skin cancer Brother    Prostate cancer Brother    Skin cancer Son    Esophageal cancer Neg  Hx    Rectal cancer Neg Hx    Stomach cancer Neg Hx    Breast cancer Neg Hx    Outpatient Medications Prior to Visit  Medication Sig Dispense Refill   AMBULATORY NON FORMULARY MEDICATION TAM herbal laxative Take 2 tablet by mouth every other day as needed     aspirin EC 81 MG tablet Take 81 mg by mouth daily. Swallow whole.     Cholecalciferol (VITAMIN D3) 50 MCG (2000 UT) TABS Take 2 tablets by mouth daily.     diclofenac Sodium (VOLTAREN) 1 % GEL Apply 2-4 g topically as needed.     diphenhydrAMINE HCl, Sleep, (SLEEP  AID) 50 MG CAPS Take 50 mg by mouth at bedtime.     fluticasone (FLONASE) 50 MCG/ACT nasal spray Place 1 spray into both nostrils daily as needed for allergies.     hydrochlorothiazide (HYDRODIURIL) 25 MG tablet Take 1 tablet (25 mg total) by mouth daily. 90 tablet 3   HYDROcodone-acetaminophen (NORCO) 10-325 MG tablet Take 1 tablet by mouth every 6 (six) hours as needed (Back pain).     LORazepam (ATIVAN) 0.5 MG tablet Take 0.5 mg by mouth daily as needed for anxiety.     losartan (COZAAR) 100 MG tablet Take 1 tablet (100 mg total) by mouth at bedtime. 90 tablet 3   lubiprostone (AMITIZA) 24 MCG capsule Take 1 capsule (24 mcg total) by mouth 2 (two) times daily with a meal. 180 capsule 0   naloxone (NARCAN) nasal spray 4 mg/0.1 mL once.     ondansetron (ZOFRAN) 4 MG tablet Take 1 tablet (4 mg total) by mouth every 8 (eight) hours as needed for nausea or vomiting. 20 tablet 0   pantoprazole (PROTONIX) 40 MG tablet Take 1 tablet (40 mg total) by mouth 2 (two) times daily before a meal. 180 tablet 0   spironolactone (ALDACTONE) 25 MG tablet Take 1 tablet (25 mg total) by mouth daily. 90 tablet 2   Facility-Administered Medications Prior to Visit  Medication Dose Route Frequency Provider Last Rate Last Admin   sodium chloride flush (NS) 0.9 % injection 3 mL  3 mL Intravenous Q12H Runell Gess, MD       Allergies  Allergen Reactions   Ciprofloxacin Rash   Lisinopril  Swelling   Pneumococcal Vaccine Swelling   Pravastatin Other (See Comments)    Muscle aches    Crestor [Rosuvastatin]     myalgias   Evolocumab Other (See Comments)    cramping   Lipitor [Atorvastatin]     myalgias   Lubiprostone     Vomiting    Norvasc [Amlodipine]     swelling   Oxycodone Other (See Comments)   Zetia [Ezetimibe]     "felt horrible"    ROS: A complete ROS was performed with pertinent positives/negatives noted in the HPI. The remainder of the ROS are negative.   Objective:   Today's Vitals   08/27/22 1312  BP: 130/74  Pulse: 75  Temp: 98.4 F (36.9 C)  SpO2: 96%  Weight: 169 lb (76.7 kg)  Height: 5\' 5"  (1.651 m)    GENERAL: Well-appearing, in NAD. Well nourished.  SKIN: Pink, warm and dry. No rash, lesion, ulceration, or ecchymoses.  NECK: Trachea midline. Full ROM w/o pain or tenderness. No lymphadenopathy.  RESPIRATORY: Chest wall symmetrical. Respirations even and non-labored. Breath sounds clear to auscultation bilaterally.  CARDIAC: S1, S2 present, regular rate and rhythm. Peripheral pulses 2+ bilaterally.  MSK: Muscle tone and strength appropriate for age.  EXTREMITIES: Without clubbing, cyanosis, or edema.  NEUROLOGIC: Steady, even gait.  PSYCH/MENTAL STATUS: Alert, oriented x 3. Cooperative, appropriate mood and affect.   There are no preventive care reminders to display for this patient.   Results for orders placed or performed in visit on 08/27/22  HM PAP SMEAR  Result Value Ref Range   HM Pap smear [facility]   POCT glycosylated hemoglobin (Hb A1C)  Result Value Ref Range   Hemoglobin A1C 5.4 4.0 - 5.6 %   HbA1c POC (<> result, manual entry)     HbA1c, POC (prediabetic range)     HbA1c,  POC (controlled diabetic range)         Assessment & Plan:  Essential hypertension Assessment & Plan: Well controlled  Continue Losartan 100mg  and HCTZ 25mg     Hypoglycemia Assessment & Plan: High protein  foods  Frequent small meals throughout the day to avoid low blood sugar   Orders: -     POCT glycosylated hemoglobin (Hb A1C)  Nausea   Continue f/u with GI as scheduled   Return in about 4 months (around 12/27/2022) for chronic condition follow up.   [name], FNP

## 2022-08-28 ENCOUNTER — Ambulatory Visit: Payer: Medicare Other | Admitting: Nurse Practitioner

## 2022-09-02 ENCOUNTER — Encounter: Payer: Self-pay | Admitting: Certified Registered Nurse Anesthetist

## 2022-09-06 ENCOUNTER — Encounter: Payer: Self-pay | Admitting: Gastroenterology

## 2022-09-06 ENCOUNTER — Ambulatory Visit (AMBULATORY_SURGERY_CENTER): Payer: Medicare Other | Admitting: Gastroenterology

## 2022-09-06 VITALS — BP 113/68 | HR 68 | Temp 99.1°F | Resp 13 | Ht 65.0 in | Wt 169.0 lb

## 2022-09-06 DIAGNOSIS — R112 Nausea with vomiting, unspecified: Secondary | ICD-10-CM

## 2022-09-06 DIAGNOSIS — K5904 Chronic idiopathic constipation: Secondary | ICD-10-CM

## 2022-09-06 DIAGNOSIS — R140 Abdominal distension (gaseous): Secondary | ICD-10-CM

## 2022-09-06 DIAGNOSIS — D125 Benign neoplasm of sigmoid colon: Secondary | ICD-10-CM

## 2022-09-06 DIAGNOSIS — R1013 Epigastric pain: Secondary | ICD-10-CM

## 2022-09-06 DIAGNOSIS — K219 Gastro-esophageal reflux disease without esophagitis: Secondary | ICD-10-CM

## 2022-09-06 DIAGNOSIS — D123 Benign neoplasm of transverse colon: Secondary | ICD-10-CM | POA: Diagnosis not present

## 2022-09-06 DIAGNOSIS — R1319 Other dysphagia: Secondary | ICD-10-CM

## 2022-09-06 DIAGNOSIS — Z800 Family history of malignant neoplasm of digestive organs: Secondary | ICD-10-CM | POA: Diagnosis not present

## 2022-09-06 DIAGNOSIS — R1084 Generalized abdominal pain: Secondary | ICD-10-CM | POA: Diagnosis not present

## 2022-09-06 MED ORDER — SODIUM CHLORIDE 0.9 % IV SOLN
500.0000 mL | Freq: Once | INTRAVENOUS | Status: DC
Start: 2022-09-06 — End: 2022-09-06

## 2022-09-06 NOTE — Progress Notes (Signed)
Called to room to assist during endoscopic procedure.  Patient ID and intended procedure confirmed with present staff. Received instructions for my participation in the procedure from the performing physician.  

## 2022-09-06 NOTE — Op Note (Signed)
[facility] Patient Name: Amanda [name] Procedure Date: 09/06/2022 1:22 PM MRN: [id number] Endoscopist: Sherilyn Cooter L. [name] , MD, [id number] Age: 77 Referring MD:  Date of Birth: 08-10-1945 Gender: Female Account #: [id number] Procedure:                Upper GI endoscopy Indications:              Generalized abdominal pain, Esophageal reflux                            symptoms that persist despite appropriate therapy,                            Nausea with vomiting Medicines:                Monitored Anesthesia Care Procedure:                Pre-Anesthesia Assessment:                           - Prior to the procedure, a History and Physical                            was performed, and patient medications and                            allergies were reviewed. The patient's tolerance of                            previous anesthesia was also reviewed. The risks                            and benefits of the procedure and the sedation                            options and risks were discussed with the patient.                            All questions were answered, and informed consent                            was obtained. Prior Anticoagulants: The patient has                            taken no anticoagulant or antiplatelet agents. ASA                            Grade Assessment: II - A patient with mild systemic                            disease. After reviewing the risks and benefits,                            the patient was deemed in satisfactory condition to  undergo the procedure.                           After obtaining informed consent, the endoscope was                            passed under direct vision. Throughout the                            procedure, the patient's blood pressure, pulse, and                            oxygen saturations were monitored continuously. The                            Olympus Scope [id number] was  introduced through the                            mouth, and advanced to the second part of duodenum.                            The upper GI endoscopy was accomplished without                            difficulty. The patient tolerated the procedure                            well. Scope In: Scope Out: Findings:                 The larynx was normal.                           A small hiatal hernia was present. (2 to 3 cm in                            both axial and transverse dimension and causing                            mild distal esophageal tortuosity. EG junction at                            37 cm from the teeth.) No esophagitis or stricture                           The stomach was normal. (Bile staining of gastric                            mucosa and a small amount of bilious fluid easily                            suctioned for good visualization. No retained food                            in  the stomach. Pylorus patent and easily traversed.                           The cardia and gastric fundus were normal on                            retroflexion.                           The examined duodenum was normal. Complications:            No immediate complications. Estimated Blood Loss:     Estimated blood loss: none. Impression:               - Normal larynx.                           - Small hiatal hernia.                           - Normal stomach.                           - Normal examined duodenum.                           - No specimens collected. Recommendation:           - Patient has a contact number available for                            emergencies. The signs and symptoms of potential                            delayed complications were discussed with the                            patient. Return to normal activities tomorrow.                            Written discharge instructions were provided to the                            patient.                            - Resume previous diet.                           - Continue present medications.                           - Await report of CT abdomen and pelvis scheduled                            for next week. This is primarily being done to rule  out any visible small bowel inflammatory or                            obstructive cause.                           Follow-up clinic with APP Steffanie Dunn) afterward to                            review imaging and discuss symptoms and available                            treatment.                           But this patient has not been diagnosed with                            gastroparesis, it is possible that there is a                            degree of opioid induced delayed gastric emptying.                           Gastroparesis dietary recommendations such as                            low-fat, more easily digestible fiber and                            smaller/more frequent meals may be helpful to                            alleviate upper GI symptoms. Sofiah Lyne L. [name], MD 09/06/2022 2:11:22 PM This report has been signed electronically.

## 2022-09-06 NOTE — Op Note (Signed)
[facility] Patient Name: Amanda [name] Procedure Date: 09/06/2022 1:23 PM MRN: [id number] Endoscopist: Sherilyn Cooter L. [name] , MD, [id number] Age: 77 Referring MD:  Date of Birth: [birth year] Gender: Female Account #: [id number] Procedure:                Colonoscopy Indications:              Generalized abdominal pain, Constipation Medicines:                Monitored Anesthesia Care Procedure:                Pre-Anesthesia Assessment:                           - Prior to the procedure, a History and Physical                            was performed, and patient medications and                            allergies were reviewed. The patient's tolerance of                            previous anesthesia was also reviewed. The risks                            and benefits of the procedure and the sedation                            options and risks were discussed with the patient.                            All questions were answered, and informed consent                            was obtained. Prior Anticoagulants: The patient has                            taken no anticoagulant or antiplatelet agents. ASA                            Grade Assessment: II - A patient with mild systemic                            disease. After reviewing the risks and benefits,                            the patient was deemed in satisfactory condition to                            undergo the procedure.                           After obtaining informed consent, the colonoscope  was passed under direct vision. Throughout the                            procedure, the patient's blood pressure, pulse, and                            oxygen saturations were monitored continuously. The                            Olympus [id number]) Colonoscope was                            introduced through the anus and advanced to the the                            cecum, identified  by appendiceal orifice and                            ileocecal valve. The colonoscopy was performed                            without difficulty. The patient tolerated the                            procedure well. The quality of the bowel                            preparation was good. The ileocecal valve,                            appendiceal orifice, and rectum were photographed. Scope In: 1:26:40 PM Scope Out: 1:45:51 PM Scope Withdrawal Time: 0 hours 17 minutes 22 seconds  Total Procedure Duration: 0 hours 19 minutes 11 seconds  Findings:                 The perianal and digital rectal examinations were                            normal.                           Repeat examination of right colon under NBI                            performed.                           A diminutive polyp was found in the transverse                            colon. The polyp was sessile. The polyp was removed                            with a cold snare. Resection and retrieval were  complete. (Jar 1)                           A diminutive polyp was found in the sigmoid colon.                            The polyp was sessile. The polyp was removed with a                            cold snare. Resection and retrieval were complete.                            Coagulation for hemostasis using snare was                            unsuccessful. For hemostasis, one hemostatic clip                            was successfully placed (MR conditional). Clip                            manufacturer: [employer]. There was no bleeding at                            the end of the procedure. (polyp in Jar 1)                           Multiple diverticula were found in the left colon.                           Internal hemorrhoids were found. The hemorrhoids                            were small.                           The exam was otherwise without abnormality on                             direct and retroflexion views.                           An area of melanosis was found in the entire colon. Complications:            No immediate complications. Estimated Blood Loss:     Estimated blood loss was minimal. Impression:               - One diminutive polyp in the transverse colon,                            removed with a cold snare. Resected and retrieved.                           - One diminutive polyp in the sigmoid colon,  removed with a cold snare. Resected and retrieved.                            Treatment not successful. Treated with a hot snare.                            Clip (MR conditional) was placed. Clip                            manufacturer: [employer].                           - Diverticulosis in the left colon.                           - Internal hemorrhoids.                           - The examination was otherwise normal on direct                            and retroflexion views.                           This patient's constipation appears to be a                            motility issue, perhaps exacerbated by daily use of                            opioid analgesic. She has had side effects from                            Linzess and Amitiza. Additional prescription                            medicines such as Motegrity or Movantik are                            considerations, though may also have risks and                            potential side effects. Safest constipation                            treatment plan would be MiraLAX 1-2 times daily and                            regular use of bisacodyl stimulant laxative tablet. Recommendation:           - Patient has a contact number available for                            emergencies. The signs and symptoms of potential  delayed complications were discussed with the                            patient. Return to normal activities  tomorrow.                            Written discharge instructions were provided to the                            patient.                           - Resume previous diet.                           - Continue present medications.                           - Await pathology results.                           - No repeat surveillance colonoscopy recommended                            due to age, current guidelines and low risk polyp                            findings today.                           - See the other procedure note for documentation of                            additional recommendations. Donalda Job L. [name], MD 09/06/2022 2:03:43 PM This report has been signed electronically.

## 2022-09-06 NOTE — Progress Notes (Signed)
Report given to PACU, vss 

## 2022-09-06 NOTE — Progress Notes (Signed)
Updated medical record.

## 2022-09-06 NOTE — Progress Notes (Signed)
1320 Robinul 0.1 mg IV given due large amount of secretions upon assessment.  MD made aware, vss  ?

## 2022-09-06 NOTE — Progress Notes (Signed)
No changes to clinical history since GI office visit on 08/17/22.  The patient is appropriate for an endoscopic procedure in the ambulatory setting.  - Amanda Bilal Danis, MD    

## 2022-09-06 NOTE — Patient Instructions (Addendum)
- Resume previous diet. - Continue present medications. - Await report of CT abdomen and pelvis scheduled for next week. This is primarily being done to rule out any visible small bowel inflammatory or obstructive cause. - Follow-up clinic with APP Steffanie Dunn) afterward to review imaging and discuss symptoms and available treatment. 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 - Gastroparesis dietary recommendations such as low-fat, more easily digestible fiber and smaller/more frequent meals may be helpful to alleviate upper GI symptoms.  - Resume previous diet.  - Continue present medications.  - Await pathology results.  - No repeat surveillance colonoscopy recommended due to age, current guidelines and low risk polyp findings today.  - See the other procedure note for documentation of additional recommendations.  YOU HAD AN ENDOSCOPIC PROCEDURE TODAY AT THE [facility]:   Refer to the procedure report that was given to you for any specific questions about what was found during the examination.  If the procedure report does not answer your questions, please call your gastroenterologist to clarify.  If you requested that your care partner not be given the details of your procedure findings, then the procedure report has been included in a sealed envelope for you to review at your convenience later.  YOU SHOULD EXPECT: Some feelings of bloating in the abdomen. Passage of more gas than usual.  Walking can help get rid of the air that was put into your GI tract during the procedure and reduce the bloating. If you had a lower endoscopy (such as a colonoscopy or flexible sigmoidoscopy) you may notice spotting of blood in your stool or on the toilet paper. If you underwent a bowel prep for your procedure, you may not have a normal bowel movement for a few days.  Please Note:  You might notice some  irritation and congestion in your nose or some drainage.  This is from the oxygen used during your procedure.  There is no need for concern and it should clear up in a day or so.  SYMPTOMS TO REPORT IMMEDIATELY:  Following lower endoscopy (colonoscopy or flexible sigmoidoscopy):  Excessive amounts of blood in the stool  Significant tenderness or worsening of abdominal pains  Swelling of the abdomen that is new, acute  Fever of 100F or higher  Following upper endoscopy (EGD)  Vomiting of blood or coffee ground material  New chest pain or pain under the shoulder blades  Painful or persistently difficult swallowing  New shortness of breath  Fever of 100F or higher  Black, tarry-looking stools  For urgent or emergent issues, a gastroenterologist can be reached at any hour by calling [phone]. Do not use MyChart messaging for urgent concerns.    DIET:  We do recommend a small meal at first, but then you may proceed to your regular diet.  Drink plenty of fluids but you should avoid alcoholic beverages for 24 hours.  ACTIVITY:  You should plan to take it easy for the rest of today and you should NOT DRIVE or use heavy machinery until tomorrow (because of the sedation medicines used during the test).    FOLLOW UP: Our staff will call the number listed on your records the next business day following your procedure.  We will call around 7:15- 8:00 am to check on you and address any questions or concerns that you may have regarding the information given to you following your  procedure. If we do not reach you, we will leave a message.     If any biopsies were taken you will be contacted by phone or by letter within the next 1-3 weeks.  Please call us at [phone] if you have not heard about the biopsies in 3 weeks.    SIGNATURES/CONFIDENTIALITY: You and/or your care partner have signed paperwork which will be entered into your electronic medical record.  These signatures attest to  the fact that that the information above on your After Visit Summary has been reviewed and is understood.  Full responsibility of the confidentiality of this discharge information lies with you and/or your care-partner.

## 2022-09-07 ENCOUNTER — Telehealth: Payer: Self-pay | Admitting: *Deleted

## 2022-09-07 NOTE — Telephone Encounter (Signed)
  Follow up Call-     09/06/2022   12:32 PM  Call back number  Post procedure Call Back phone  # [phone]  Permission to leave phone message Yes     Patient questions:  Do you have a fever, pain , or abdominal swelling? No. Pain Score  0 *  Have you tolerated food without any problems? Yes.    Have you been able to return to your normal activities? Yes.    Do you have any questions about your discharge instructions: Diet   No. Medications  No. Follow up visit  No.  Do you have questions or concerns about your Care? No.  Actions: * If pain score is 4 or above: No action needed, pain <4.

## 2022-09-11 ENCOUNTER — Ambulatory Visit (HOSPITAL_COMMUNITY)
Admission: RE | Admit: 2022-09-11 | Discharge: 2022-09-11 | Disposition: A | Discharge status: Home / Self Care | Payer: Medicare Other | Source: Ambulatory Visit | Attending: Physician Assistant | Admitting: Physician Assistant

## 2022-09-11 DIAGNOSIS — R140 Abdominal distension (gaseous): Secondary | ICD-10-CM | POA: Insufficient documentation

## 2022-09-11 DIAGNOSIS — R112 Nausea with vomiting, unspecified: Secondary | ICD-10-CM | POA: Insufficient documentation

## 2022-09-11 DIAGNOSIS — K429 Umbilical hernia without obstruction or gangrene: Secondary | ICD-10-CM | POA: Diagnosis not present

## 2022-09-11 DIAGNOSIS — K5730 Diverticulosis of large intestine without perforation or abscess without bleeding: Secondary | ICD-10-CM | POA: Diagnosis not present

## 2022-09-11 DIAGNOSIS — R1013 Epigastric pain: Secondary | ICD-10-CM | POA: Insufficient documentation

## 2022-09-11 MED ORDER — IOHEXOL 350 MG/ML SOLN
60.0000 mL | Freq: Once | INTRAVENOUS | Status: AC | PRN
  Administered 2022-09-11: 60 mL via INTRAVENOUS

## 2022-09-17 ENCOUNTER — Encounter: Payer: Self-pay | Admitting: Internal Medicine

## 2022-09-17 ENCOUNTER — Encounter: Payer: Self-pay | Admitting: Gastroenterology

## 2022-09-17 DIAGNOSIS — I7143 Infrarenal abdominal aortic aneurysm, without rupture: Secondary | ICD-10-CM

## 2022-09-17 HISTORY — DX: Infrarenal abdominal aortic aneurysm, without rupture: I71.43

## 2022-09-18 ENCOUNTER — Ambulatory Visit: Payer: Medicare Other | Admitting: Nurse Practitioner

## 2022-09-19 ENCOUNTER — Telehealth: Payer: Self-pay | Admitting: Gastroenterology

## 2022-09-19 ENCOUNTER — Telehealth: Payer: Self-pay | Admitting: Internal Medicine

## 2022-09-19 NOTE — Telephone Encounter (Signed)
Amanda [name] [phone]  Dawn would like a call to discuss her mom's CT scan.

## 2022-09-19 NOTE — Telephone Encounter (Signed)
Spoke with pt and she is aware of results per Quentin Mulling PA. Pt already has OV scheduled.

## 2022-09-19 NOTE — Telephone Encounter (Signed)
Please give the patient daughter a call back concerning her mom Amanda [name] [phone] (daughter)

## 2022-09-19 NOTE — Telephone Encounter (Signed)
Patient called in requesting CT results from 6/25 .Please advise

## 2022-09-21 NOTE — Telephone Encounter (Signed)
Pts daughter called again. She would like a call to discuss her mom's CT scan. Please call Dawn at [phone]

## 2022-09-21 NOTE — Telephone Encounter (Signed)
Discussed CT results with Daughter, Alvis Lemmings, on HIPAA. She verbalized understanding and in agreement with plan of care.

## 2022-09-21 NOTE — Telephone Encounter (Signed)
Attempted to call Amanda [name] but she was not available,  spoke with patient to get clarification about CT scan and Amanda [name] would like to discuss CT scan with PCP and not Gastro.  Call back # [phone]

## 2022-09-25 DIAGNOSIS — M5030 Other cervical disc degeneration, unspecified cervical region: Secondary | ICD-10-CM | POA: Diagnosis not present

## 2022-09-25 DIAGNOSIS — M961 Postlaminectomy syndrome, not elsewhere classified: Secondary | ICD-10-CM | POA: Diagnosis not present

## 2022-09-25 DIAGNOSIS — M5136 Other intervertebral disc degeneration, lumbar region: Secondary | ICD-10-CM | POA: Diagnosis not present

## 2022-09-25 DIAGNOSIS — M5412 Radiculopathy, cervical region: Secondary | ICD-10-CM | POA: Diagnosis not present

## 2022-10-04 ENCOUNTER — Other Ambulatory Visit: Payer: Self-pay

## 2022-10-04 MED ORDER — LOSARTAN POTASSIUM 100 MG PO TABS: 100.0000 mg | ORAL_TABLET | Freq: Every day | ORAL | 3 refills | Status: DC

## 2022-10-11 ENCOUNTER — Ambulatory Visit (INDEPENDENT_AMBULATORY_CARE_PROVIDER_SITE_OTHER): Payer: Medicare Other | Admitting: Internal Medicine

## 2022-10-11 ENCOUNTER — Other Ambulatory Visit: Payer: Self-pay | Admitting: Internal Medicine

## 2022-10-11 ENCOUNTER — Ambulatory Visit (HOSPITAL_BASED_OUTPATIENT_CLINIC_OR_DEPARTMENT_OTHER)
Admission: RE | Admit: 2022-10-11 | Discharge: 2022-10-11 | Disposition: A | Discharge status: Home / Self Care | Payer: Medicare Other | Source: Ambulatory Visit | Attending: Internal Medicine | Admitting: Internal Medicine

## 2022-10-11 ENCOUNTER — Encounter: Payer: Self-pay | Admitting: Internal Medicine

## 2022-10-11 VITALS — BP 130/82 | HR 70 | Temp 98.2°F | Ht 65.0 in | Wt 170.4 lb

## 2022-10-11 DIAGNOSIS — F331 Major depressive disorder, recurrent, moderate: Secondary | ICD-10-CM

## 2022-10-11 DIAGNOSIS — R0781 Pleurodynia: Secondary | ICD-10-CM | POA: Diagnosis not present

## 2022-10-11 DIAGNOSIS — F419 Anxiety disorder, unspecified: Secondary | ICD-10-CM

## 2022-10-11 MED ORDER — METHOCARBAMOL 500 MG PO TABS
500.0000 mg | ORAL_TABLET | Freq: Three times a day (TID) | ORAL | 0 refills | Status: DC | PRN
Start: 2022-10-11 — End: 2023-06-13

## 2022-10-11 MED ORDER — LORAZEPAM 0.5 MG PO TABS
ORAL_TABLET | ORAL | 2 refills | Status: DC
Start: 2022-10-11 — End: 2023-02-20

## 2022-10-11 MED ORDER — BUPROPION HCL 100 MG PO TABS
100.0000 mg | ORAL_TABLET | Freq: Two times a day (BID) | ORAL | 1 refills | Status: DC
Start: 2022-10-11 — End: 2022-10-17

## 2022-10-11 NOTE — Progress Notes (Signed)
[facility] [address] [place] [address] Dept: [phone] Dept Fax: [phone]  Acute Care Office Visit  Subjective:   Amanda [name] 1945-10-05 10/11/2022  Chief Complaint  Patient presents with   Rib Injury    Monday patient was in the yard and tripped over a root and injured her rib trying to catch herself from falling     HPI: Discussed the use of AI scribe software for clinical note transcription with the patient, who gave verbal consent to proceed.  History of Present Illness   The patient, with a history of chronic back pain managed with hydrocodone and Voltaren gel, presents with right-sided rib pain after a fall 3 days ago. They tripped over a root and attempted to catch themselves, resulting in a jerking motion and subsequent pain. Patient caught themselves with their arms. They did not fully fall or hit their head, and they deny any neck pain or loss of consciousness. The pain is localized to the right side, radiating from the ribs up into the shoulder, and is exacerbated by deep breathing. They have been managing the pain with their usual hydrocodone and Voltaren gel, and attempted to use a back support brace, but found it unhelpful.States pain has improved since initial injury, but still present.   In addition to the fall, they also express feelings of depression, with frequent crying and a lack of interest in activities. Hx of anxiety/depression, but depression symptoms have recently worsened. They have previously been on Zoloft for depression, but found it caused weight gain and did not fully alleviate their symptoms. They have also been taking Ativan as needed for anxiety. They state they find no purpose in life. Denies SI/HI.            10/11/2022    8:37 AM 08/27/2022    1:12 PM 12/30/2018    2:03 PM  Depression screen PHQ 2/9  Decreased Interest 3 0 0  Down, Depressed, Hopeless 2 0 1  PHQ - 2  Score 5 0 1  Altered sleeping 2  0  Tired, decreased energy 3  3  Change in appetite 2  1  Feeling bad or failure about yourself  1  0  Trouble concentrating 1  0  Moving slowly or fidgety/restless 1  1  Suicidal thoughts 0  0  PHQ-9 Score 15  6  Difficult doing work/chores Somewhat difficult  Somewhat difficult    The following portions of the patient's history were reviewed and updated as appropriate: past medical history, past surgical history, family history, social history, allergies, medications, and problem list.   Patient Active Problem List   Diagnosis Date Noted   Infrarenal abdominal aortic aneurysm (AAA) without rupture (HCC) 09/17/2022   Hypoglycemia 08/27/2022   Shoulder pain, left 08/31/2021   Pituitary adenoma (HCC) 03/31/2021   Pituitary mass (HCC) 02/14/2021   Mixed stress and urge urinary incontinence 11/29/2020   Sensorineural hearing loss (SNHL) of both ears 10/13/2020   Hyperlipidemia 08/02/2020   PVD (peripheral vascular disease) (HCC) 08/01/2020   Peripheral arterial disease (HCC) 07/26/2020   Basal cell carcinoma (BCC) of right lower leg 06/06/2020   Major depressive disorder with single episode, in full remission (HCC) 01/22/2020   Statin intolerance 01/26/2019   Insomnia due to other mental disorder 12/30/2018   Basal cell carcinoma (BCC) of left ala nasi 09/12/2018   Lumbar spondylosis 12/26/2017   Degeneration of lumbar intervertebral disc 07/09/2017   Lumbar post-laminectomy syndrome  07/09/2017   Herniated lumbar intervertebral disc 11/07/2016   Chronic right sacroiliac joint pain 06/08/2016   Chronic right-sided low back pain with right-sided sciatica 06/08/2016   Impingement syndrome of right shoulder 03/08/2016   Anxiety 08/29/2015   Osteoporosis 08/29/2015   CAD (coronary artery disease) 04/01/2014   Abnormal stress echocardiogram 01/29/2014   Dyspnea 01/04/2014   Essential hypertension    Melanoma (HCC)    Stricture of esophagus     Diverticulosis    Vitamin D deficiency    Hiatal hernia    Hemorrhoids    Arthritis    GERD (gastroesophageal reflux disease)    Osteopenia    Back pain 02/05/2011   Family history of malignant neoplasm of gastrointestinal tract 08/21/2010   Dysphagia, unspecified(787.20) 08/21/2010   ESOPHAGEAL REFLUX 01/11/2009   Past Medical History:  Diagnosis Date   Anxiety    Arthritis    Back pain    CAD (coronary artery disease)    a. 01/2014: cath showing 70% stenosis of non-dominant RCA --> medically managed.    Cataract    bilat removed   Chest pain 07/22/2014   DDD (degenerative disc disease), cervical    DDD (degenerative disc disease), lumbar    Depression    Diverticulosis    GERD (gastroesophageal reflux disease)    Headache    Hemorrhoids    Hiatal hernia    Hyperlipidemia    Hypertension    Melanoma (HCC)    Orbital fracture (HCC)    right   Osteopenia 07/2015   T score -2.0 FRAX 10%/1.4%   Osteoporosis 08/29/2015   Pituitary tumor    Stricture of esophagus    Vitamin D deficiency    Past Surgical History:  Procedure Laterality Date   ABDOMINAL AORTOGRAM W/LOWER EXTREMITY N/A 08/01/2020   Procedure: ABDOMINAL AORTOGRAM W/LOWER EXTREMITY;  Surgeon: Runell Gess, MD;  Location: [facility];  Service: Cardiovascular;  Laterality: N/A;   CERVICAL DISC SURGERY     x 2   COLONOSCOPY     EXCISION OF SKIN CANCER     Melanoma   FOOT SURGERY Right    HEMORRHOID BANDING     X3   INCONTINENCE SURGERY  12/2020   LEFT HEART CATHETERIZATION WITH CORONARY ANGIOGRAM N/A 02/03/2014   Procedure: LEFT HEART CATHETERIZATION WITH CORONARY ANGIOGRAM;  Surgeon: Micheline Chapman, MD;  Location: [facility] CATH [facility];  Service: Cardiovascular;  Laterality: N/A;   LUMBAR DISC SURGERY  2018   OOPHORECTOMY  2012   BSO   PELVIC LAPAROSCOPY  2012   Diag Lap-BSO-lysis of adhesions   PERIPHERAL VASCULAR INTERVENTION Left 08/01/2020   Procedure: PERIPHERAL VASCULAR INTERVENTION;   Surgeon: Runell Gess, MD;  Location: [facility];  Service: Cardiovascular;  Laterality: Left;   TUBAL LIGATION     VAGINAL HYSTERECTOMY  1993   Family History  Problem Relation Age of Onset   Heart disease Mother    Hypertension Mother    Stroke Mother    Colon polyps Father    Heart disease Father        CHF   Hypertension Father    Cancer Father        COLON   Colon cancer Father    Colon cancer Sister    Colon polyps Sister    Heart disease Sister    Diabetes Sister    Melanoma Sister    Heart disease Sister    Heart disease Sister    Colon polyps  Brother    Diabetes Brother    Heart disease Brother    Cancer Brother        COLON   Melanoma Brother    Colon cancer Brother    Melanoma Brother    Skin cancer Brother    Skin cancer Brother    Prostate cancer Brother    Skin cancer Daughter    Hypertension Daughter    Skin cancer Daughter    Migraines Daughter    Skin cancer Son    Esophageal cancer Neg Hx    Rectal cancer Neg Hx    Stomach cancer Neg Hx    Breast cancer Neg Hx    Outpatient Medications Prior to Visit  Medication Sig Dispense Refill   AMBULATORY NON FORMULARY MEDICATION TAM herbal laxative Take 2 tablet by mouth every other day as needed     aspirin EC 81 MG tablet Take 81 mg by mouth daily. Swallow whole.     Cholecalciferol (VITAMIN D3) 50 MCG (2000 UT) TABS Take 2 tablets by mouth daily.     diclofenac Sodium (VOLTAREN) 1 % GEL Apply 2-4 g topically as needed.     diphenhydrAMINE HCl, Sleep, (SLEEP AID) 50 MG CAPS Take 50 mg by mouth at bedtime.     fluticasone (FLONASE) 50 MCG/ACT nasal spray Place 1 spray into both nostrils daily as needed for allergies.     hydrochlorothiazide (HYDRODIURIL) 25 MG tablet Take 1 tablet (25 mg total) by mouth daily. 90 tablet 3   HYDROcodone-acetaminophen (NORCO) 10-325 MG tablet Take 1 tablet by mouth every 6 (six) hours as needed (Back pain).     LORazepam (ATIVAN) 0.5 MG tablet Take 0.5 mg by  mouth daily as needed for anxiety.     losartan (COZAAR) 100 MG tablet Take 1 tablet (100 mg total) by mouth at bedtime. 90 tablet 3   lubiprostone (AMITIZA) 24 MCG capsule Take 1 capsule (24 mcg total) by mouth 2 (two) times daily with a meal. 180 capsule 0   ondansetron (ZOFRAN) 4 MG tablet Take 1 tablet (4 mg total) by mouth every 8 (eight) hours as needed for nausea or vomiting. 20 tablet 0   pantoprazole (PROTONIX) 40 MG tablet Take 1 tablet (40 mg total) by mouth 2 (two) times daily before a meal. 180 tablet 0   spironolactone (ALDACTONE) 25 MG tablet Take 1 tablet (25 mg total) by mouth daily. 90 tablet 2   No facility-administered medications prior to visit.   Allergies  Allergen Reactions   Ciprofloxacin Rash   Lisinopril Swelling   Pneumococcal Vaccine Swelling   Pravastatin Other (See Comments)    Muscle aches    Crestor [Rosuvastatin]     myalgias   Evolocumab Other (See Comments)    cramping   Lipitor [Atorvastatin]     myalgias   Lubiprostone     Vomiting    Norvasc [Amlodipine]     swelling   Oxycodone Other (See Comments)   Zetia [Ezetimibe]     "felt horrible"     ROS: A complete ROS was performed with pertinent positives/negatives noted in the HPI. The remainder of the ROS are negative.    Objective:   Today's Vitals   10/11/22 0837  BP: 130/82  Pulse: 70  Temp: 98.2 F (36.8 C)  TempSrc: Temporal  SpO2: 97%  Weight: 170 lb 6.4 oz (77.3 kg)  Height: 5\' 5"  (1.651 m)    GENERAL: Well-appearing, in NAD. Well nourished.  SKIN: Pink, warm and  dry. No rash, lesion, ulceration, or ecchymoses.  NECK: Trachea midline. Full ROM w/o pain or tenderness. No lymphadenopathy.  RESPIRATORY: Chest wall symmetrical. Respirations even and non-labored. Breath sounds clear to auscultation bilaterally.  CARDIAC: S1, S2 present, regular rate and rhythm. Peripheral pulses 2+ bilaterally.  MSK: Muscle tone and strength appropriate for age. TTP to right rib  cage EXTREMITIES: Without clubbing, cyanosis, or edema.  NEUROLOGIC:  Steady, even gait.  PSYCH/MENTAL STATUS: Alert, oriented x 3. Cooperative, intermittently tearful.    No results found for any visits on 10/11/22.    Assessment & Plan:  Assessment and Plan    Right Rib Pain: Pain in the right rib area and shoulder after a near fall 3 days ago. Pain on deep inspiration. No head injury or loss of consciousness. Pain improved but still present. -Order rib x-ray to rule out fracture. -Continue current pain management with Hydrocodone and Voltaren Gel. -Consider alternating heat and ice application.  Depression: Reports of increased crying and feelings of hopelessness. Previous trial of Zoloft with limited benefit and weight gain. -Start Wellbutrin for depression. -Continue Ativan as needed for anxiety. -Complete PHQ-9 survey to establish baseline. -Schedule follow-up in 6 weeks to assess response to Wellbutrin. -Encourage patient to explore community resources and her church activities for social engagement.       Meds ordered this encounter  Medications   buPROPion (WELLBUTRIN) 100 MG tablet    Sig: Take 1 tablet (100 mg total) by mouth 2 (two) times daily.    Dispense:  180 tablet    Refill:  1    Order Specific Question:   Supervising Provider    Answer:   Garnette Gunner [[id number]]   Lab Orders  No laboratory test(s) ordered today   No images are attached to the encounter or orders placed in the encounter.  Return in about 6 weeks (around 11/22/2022) for Anxiety/Depression.   Of note, portions of this note may have been created with voice recognition software Physicist, medical). While this note has been edited for accuracy, occasional wrong-word or 'sound-a-like' substitutions may have occurred due to the inherent limitations of voice recognition software.  [name], FNP

## 2022-10-11 NOTE — Patient Instructions (Signed)
Go for rib xray

## 2022-10-17 ENCOUNTER — Telehealth: Payer: Self-pay | Admitting: Internal Medicine

## 2022-10-17 ENCOUNTER — Other Ambulatory Visit: Payer: Self-pay | Admitting: Internal Medicine

## 2022-10-17 DIAGNOSIS — F419 Anxiety disorder, unspecified: Secondary | ICD-10-CM

## 2022-10-17 MED ORDER — FLUOXETINE HCL 10 MG PO CAPS
10.0000 mg | ORAL_CAPSULE | Freq: Every day | ORAL | 0 refills | Status: DC
Start: 2022-10-17 — End: 2022-11-22

## 2022-10-17 NOTE — Telephone Encounter (Signed)
Called patient to get more info and patient stated that she has not been able to sleep since she started taking buPROPion (WELLBUTRIN) 100 MG,  patient also stated that she tried only taking one pill to see if that would help and didn't.  She would like a different Rx and a refill on LORazepam (ATIVAN) 0.5 MG tablet  Please advise

## 2022-10-17 NOTE — Telephone Encounter (Signed)
Discontinue Bupropion , start Prozac 1 tablet once daily.  I had already sent in refills of her ativan at her visit on 7/25. They should be at the pharmacy.

## 2022-10-17 NOTE — Telephone Encounter (Signed)
10/17/22 - Pt called requesting  to speak with her PCP or if she could get a call back on [phone] to talk about her medications.

## 2022-10-17 NOTE — Telephone Encounter (Signed)
Patient informed of message  

## 2022-10-18 DIAGNOSIS — M5412 Radiculopathy, cervical region: Secondary | ICD-10-CM | POA: Diagnosis not present

## 2022-10-26 ENCOUNTER — Ambulatory Visit (INDEPENDENT_AMBULATORY_CARE_PROVIDER_SITE_OTHER): Payer: Medicare Other

## 2022-10-26 DIAGNOSIS — Z0000 Encounter for general adult medical examination without abnormal findings: Secondary | ICD-10-CM | POA: Diagnosis not present

## 2022-10-26 NOTE — Progress Notes (Signed)
Subjective:   Amanda [name] is a 77 y.o. female who presents for Medicare Annual (Subsequent) preventive examination.  Visit Complete: Virtual  I connected with  Amanda [name] on 10/26/22 by a audio enabled telemedicine application and verified that I am speaking with the correct person using two identifiers.  Patient Location: Home  Provider Location: Office/Clinic  I discussed the limitations of evaluation and management by telemedicine. The patient expressed understanding and agreed to proceed.  Vital Signs: Unable to obtain new vitals due to this being a telehealth visit..  Review of Systems     Cardiac Risk Factors include: advanced age (>49men, >77 women);dyslipidemia     Objective:    Today's Vitals   10/26/22 1500  PainSc: 5    There is no height or weight on file to calculate BMI.     10/26/2022    3:08 PM 08/01/2020    6:08 AM 05/08/2020    2:39 PM 05/07/2020    3:04 PM 11/22/2015    2:10 PM 07/22/2014    8:41 PM 07/22/2014    2:21 PM  Advanced Directives  Does Patient Have a Medical Advance Directive? No No No No No No No  Would patient like information on creating a medical advance directive?  No - Patient declined Yes (ED - Information included in AVS) No - Patient declined  No - patient declined information No - patient declined information    Current Medications (verified) Outpatient Encounter Medications as of 10/26/2022  Medication Sig   AMBULATORY NON FORMULARY MEDICATION TAM herbal laxative Take 2 tablet by mouth every other day as needed   aspirin EC 81 MG tablet Take 81 mg by mouth daily. Swallow whole.   Cholecalciferol (VITAMIN D3) 50 MCG (2000 UT) TABS Take 2 tablets by mouth daily.   diclofenac Sodium (VOLTAREN) 1 % GEL Apply 2-4 g topically as needed.   diphenhydrAMINE HCl, Sleep, (SLEEP AID) 50 MG CAPS Take 50 mg by mouth at bedtime.   FLUoxetine (PROZAC) 10 MG capsule Take 1 capsule (10 mg total) by mouth daily.   fluticasone (FLONASE) 50  MCG/ACT nasal spray Place 1 spray into both nostrils daily as needed for allergies.   hydrochlorothiazide (HYDRODIURIL) 25 MG tablet Take 1 tablet (25 mg total) by mouth daily.   HYDROcodone-acetaminophen (NORCO) 10-325 MG tablet Take 1 tablet by mouth every 6 (six) hours as needed (Back pain).   LORazepam (ATIVAN) 0.5 MG tablet Take 1-2 tablets by mouth once daily as needed for anxiety.   losartan (COZAAR) 100 MG tablet Take 1 tablet (100 mg total) by mouth at bedtime.   pantoprazole (PROTONIX) 40 MG tablet Take 1 tablet (40 mg total) by mouth 2 (two) times daily before a meal.   spironolactone (ALDACTONE) 25 MG tablet Take 1 tablet (25 mg total) by mouth daily.   lubiprostone (AMITIZA) 24 MCG capsule Take 1 capsule (24 mcg total) by mouth 2 (two) times daily with a meal. (Patient not taking: Reported on 10/26/2022)   methocarbamol (ROBAXIN) 500 MG tablet Take 1 tablet (500 mg total) by mouth every 8 (eight) hours as needed for muscle spasms. (Patient not taking: Reported on 10/26/2022)   ondansetron (ZOFRAN) 4 MG tablet Take 1 tablet (4 mg total) by mouth every 8 (eight) hours as needed for nausea or vomiting. (Patient not taking: Reported on 10/26/2022)   No facility-administered encounter medications on file as of 10/26/2022.    Allergies (verified) Ciprofloxacin, Lisinopril, Pneumococcal vaccine, Pravastatin, Crestor [rosuvastatin], Evolocumab, Lipitor [  atorvastatin], Lubiprostone, Norvasc [amlodipine], Oxycodone, and Zetia [ezetimibe]   History: Past Medical History:  Diagnosis Date   Anxiety    Arthritis    Back pain    CAD (coronary artery disease)    a. 01/2014: cath showing 70% stenosis of non-dominant RCA --> medically managed.    Cataract    bilat removed   Chest pain 07/22/2014   DDD (degenerative disc disease), cervical    DDD (degenerative disc disease), lumbar    Depression    Diverticulosis    GERD (gastroesophageal reflux disease)    Headache    Hemorrhoids    Hiatal  hernia    Hyperlipidemia    Hypertension    Melanoma (HCC)    Orbital fracture (HCC)    right   Osteopenia 07/2015   T score -2.0 FRAX 10%/1.4%   Osteoporosis 08/29/2015   Pituitary tumor    Stricture of esophagus    Vitamin D deficiency    Past Surgical History:  Procedure Laterality Date   ABDOMINAL AORTOGRAM W/LOWER EXTREMITY N/A 08/01/2020   Procedure: ABDOMINAL AORTOGRAM W/LOWER EXTREMITY;  Surgeon: Runell Gess, MD;  Location: [facility];  Service: Cardiovascular;  Laterality: N/A;   CERVICAL DISC SURGERY     x 2   COLONOSCOPY     EXCISION OF SKIN CANCER     Melanoma   FOOT SURGERY Right    HEMORRHOID BANDING     X3   INCONTINENCE SURGERY  12/2020   LEFT HEART CATHETERIZATION WITH CORONARY ANGIOGRAM N/A 02/03/2014   Procedure: LEFT HEART CATHETERIZATION WITH CORONARY ANGIOGRAM;  Surgeon: Micheline Chapman, MD;  Location: [facility] C[facility];  Service: Cardiovascular;  Laterality: N/A;   LUMBAR DISC SURGERY  2018   OOPHORECTOMY  2012   BSO   PELVIC LAPAROSCOPY  2012   Diag Lap-BSO-lysis of adhesions   PERIPHERAL VASCULAR INTERVENTION Left 08/01/2020   Procedure: PERIPHERAL VASCULAR INTERVENTION;  Surgeon: Runell Gess, MD;  Location: [facility];  Service: Cardiovascular;  Laterality: Left;   TUBAL LIGATION     VAGINAL HYSTERECTOMY  1993   Family History  Problem Relation Age of Onset   Heart disease Mother    Hypertension Mother    Stroke Mother    Colon polyps Father    Heart disease Father        CHF   Hypertension Father    Cancer Father        COLON   Colon cancer Father    Colon cancer Sister    Colon polyps Sister    Heart disease Sister    Diabetes Sister    Melanoma Sister    Heart disease Sister    Heart disease Sister    Colon polyps Brother    Diabetes Brother    Heart disease Brother    Cancer Brother        COLON   Melanoma Brother    Colon cancer Brother    Melanoma Brother    Skin cancer Brother    Skin cancer  Brother    Prostate cancer Brother    Skin cancer Daughter    Hypertension Daughter    Skin cancer Daughter    Migraines Daughter    Skin cancer Son    Esophageal cancer Neg Hx    Rectal cancer Neg Hx    Stomach cancer Neg Hx    Breast cancer Neg Hx    Social History   Socioeconomic History   Marital status:  Widowed    Spouse name: Not on file   Number of children: 3   Years of education: Not on file   Highest education level: 10th grade  Occupational History   Occupation: Disabled/retired  Tobacco Use   Smoking status: Former    Current packs/day: 0.00    Types: Cigarettes    Start date: 03/19/1974    Quit date: 03/19/1994    Years since quitting: 28.6   Smokeless tobacco: Never  Vaping Use   Vaping status: Never Used  Substance and Sexual Activity   Alcohol use: Not Currently    Comment: not much when younger, nothing for years   Drug use: Yes    Types: Hydrocodone   Sexual activity: Not Currently    Birth control/protection: Surgical    Comment: 1st intercourse 77 yo-Fewer than 5 partners,des neg  Other Topics Concern   Not on file  Social History Narrative   Lives with daughter    Some caffeine    Social Determinants of Health   Financial Resource Strain: Low Risk  (10/26/2022)   Overall Financial Resource Strain (CARDIA)    Difficulty of Paying Living Expenses: Not hard at all  Food Insecurity: No Food Insecurity (10/26/2022)   Hunger Vital Sign    Worried About Running Out of Food in the Last Year: Never true    Ran Out of Food in the Last Year: Never true  Transportation Needs: No Transportation Needs (10/26/2022)   PRAPARE - Administrator, Civil Service (Medical): No    Lack of Transportation (Non-Medical): No  Physical Activity: Sufficiently Active (10/26/2022)   Exercise Vital Sign    Days of Exercise per Week: 6 days    Minutes of Exercise per Session: 30 min  Stress: No Stress Concern Present (10/26/2022)   [employer] of Occupational  Health - Occupational Stress Questionnaire    Feeling of Stress : Only a little  Social Connections: Moderately Isolated (10/26/2022)   Social Connection and Isolation Panel [NHANES]    Frequency of Communication with Friends and Family: More than three times a week    Frequency of Social Gatherings with Friends and Family: More than three times a week    Attends Religious Services: More than 4 times per year    Active Member of [employer] or Organizations: No    Attends Banker Meetings: Never    Marital Status: Widowed    Tobacco Counseling Counseling given: Not Answered   Clinical Intake:  Pre-visit preparation completed: Yes  Pain : 0-10 Pain Score: 5  Pain Type: Chronic pain Pain Location: Back Pain Orientation: Lower Pain Descriptors / Indicators: Aching Pain Onset: More than a month ago Pain Frequency: Constant     Nutritional Risks: None Diabetes: No  How often do you need to have someone help you when you read instructions, pamphlets, or other written materials from your doctor or pharmacy?: 1 - Never  Interpreter Needed?: No  Information entered by :: NAllen LPN   Activities of Daily Living    10/26/2022    3:01 PM  In your present state of health, do you have any difficulty performing the following activities:  Hearing? 1  Comment has hearing aids, but does not use them very much  Vision? 0  Difficulty concentrating or making decisions? 0  Walking or climbing stairs? 1  Dressing or bathing? 0  Doing errands, shopping? 0  Preparing Food and eating ? N  Using the Toilet? N  In  the past six months, have you accidently leaked urine? N  Do you have problems with loss of bowel control? N  Managing your Medications? N  Managing your Finances? N  Housekeeping or managing your Housekeeping? N    Patient Care Team: [name], FNP as PCP - General (Internal Medicine) Meriam Sprague, MD as PCP - Cardiology (Cardiology) Runell Gess,  MD as PCP - [facility] Cardiology (Cardiology) Sheran Luz, MD as Consulting Physician (Physical Medicine and Rehabilitation)  Indicate any recent Medical Services you may have received from other than [facility] providers in the past year (date may be approximate).     Assessment:   This is a routine wellness examination for Rudolph.  Hearing/Vision screen Hearing Screening - Comments:: Has hearing aids but does not wear them often Vision Screening - Comments:: Regular eye exams, [facility]  Dietary issues and exercise activities discussed:     Goals Addressed             This Visit's Progress    Patient Stated       10/26/2022, denies any goals at this time       Depression Screen    10/26/2022    3:10 PM 10/11/2022    8:37 AM 08/27/2022    1:12 PM 12/30/2018    2:03 PM  PHQ 2/9 Scores  PHQ - 2 Score 0 5 0 1  PHQ- 9 Score 6 15  6     Fall Risk    10/26/2022    3:08 PM 10/11/2022    8:37 AM 08/27/2022    1:11 PM 05/30/2022   12:03 PM  Fall Risk   Falls in the past year? 1 1 0 0  Comment tripped in the yard     Number falls in past yr: 0 0 0 0  Injury with Fall? 1 1 0 0  Comment pulled a muscle in rib     Risk for fall due to : Medication side effect Other (Comment) Other (Comment)   Follow up Falls prevention discussed;Falls evaluation completed Falls prevention discussed Falls prevention discussed     MEDICARE RISK AT HOME:  Medicare Risk at Home - 10/26/22 1509     Any stairs in or around the home? No    If so, are there any without handrails? No    Home free of loose throw rugs in walkways, pet beds, electrical cords, etc? Yes    Adequate lighting in your home to reduce risk of falls? Yes    Life alert? No    Use of a cane, walker or w/c? No    Grab bars in the bathroom? Yes    Shower chair or bench in shower? No    Elevated toilet seat or a handicapped toilet? No             TIMED UP AND GO:  Was the test performed?  No    Cognitive Function:         10/26/2022    3:12 PM  6CIT Screen  What Year? 0 points  What month? 0 points  What time? 0 points  Count back from 20 0 points  Months in reverse 0 points  Repeat phrase 2 points  Total Score 2 points    Immunizations Immunization History  Administered Date(s) Administered   Fluad Quad(high Dose 65+) 12/30/2018   Influenza, High Dose Seasonal PF 12/30/2018   Moderna Sars-Covid-2 Vaccination 04/23/2019, 05/21/2019, 02/18/2020   Pneumococcal Polysaccharide-23 10/01/2013, 03/08/2016  Tdap 05/17/2012   Zoster Recombinant(Shingrix) 08/11/2021, 01/05/2022   Zoster, Unspecified 08/11/2021    TDAP status: Due, Education has been provided regarding the importance of this vaccine. Advised may receive this vaccine at local pharmacy or Health Dept. Aware to provide a copy of the vaccination record if obtained from local pharmacy or Health Dept. Verbalized acceptance and understanding.  Flu Vaccine status: Declined, Education has been provided regarding the importance of this vaccine but patient still declined. Advised may receive this vaccine at local pharmacy or Health Dept. Aware to provide a copy of the vaccination record if obtained from local pharmacy or Health Dept. Verbalized acceptance and understanding.  Pneumococcal vaccine status: Up to date  Covid-19 vaccine status: Completed vaccines  Qualifies for Shingles Vaccine? Yes   Zostavax completed Yes   Shingrix Completed?: Yes  Screening Tests Health Maintenance  Topic Date Due   INFLUENZA VACCINE  10/18/2022   Medicare Annual Wellness (AWV)  10/26/2023   MAMMOGRAM  11/10/2023   DEXA SCAN  Completed   HPV VACCINES  Aged Out   DTaP/Tdap/Td  Discontinued   Pneumonia Vaccine 68+ Years old  Discontinued   PAP SMEAR-Modifier  Discontinued   Colonoscopy  Discontinued   COVID-19 Vaccine  Discontinued   Hepatitis C Screening  Discontinued   Zoster Vaccines- Shingrix  Discontinued    Health Maintenance  Health Maintenance Due   Topic Date Due   INFLUENZA VACCINE  10/18/2022    Colorectal cancer screening: No longer required.   Mammogram status: Completed 11/09/2021. Repeat every year  Bone Density status: Completed 08/02/2015.  Lung Cancer Screening: (Low Dose CT Chest recommended if Age 24-80 years, 20 pack-year currently smoking OR have quit w/in 15years.) does not qualify.   Lung Cancer Screening Referral: no  Additional Screening:  Hepatitis C Screening: does not qualify;   Vision Screening: Recommended annual ophthalmology exams for early detection of glaucoma and other disorders of the eye. Is the patient up to date with their annual eye exam?  Yes  Who is the provider or what is the name of the office in which the patient attends annual eye exams? Can't remember name If pt is not established with a provider, would they like to be referred to a provider to establish care? No .   Dental Screening: Recommended annual dental exams for proper oral hygiene  Diabetic Foot Exam: n/a  Community Resource Referral / Chronic Care Management: CRR required this visit?  No   CCM required this visit?  No     Plan:     I have personally reviewed and noted the following in the patient's chart:   Medical and social history Use of alcohol, tobacco or illicit drugs  Current medications and supplements including opioid prescriptions. Patient is currently taking opioid prescriptions. Information provided to patient regarding non-opioid alternatives. Patient advised to discuss non-opioid treatment plan with their provider. Functional ability and status Nutritional status Physical activity Advanced directives List of other physicians Hospitalizations, surgeries, and ER visits in previous 12 months Vitals Screenings to include cognitive, depression, and falls Referrals and appointments  In addition, I have reviewed and discussed with patient certain preventive protocols, quality metrics, and best practice  recommendations. A written personalized care plan for preventive services as well as general preventive health recommendations were provided to patient.     Barb Merino, LPN   08/22/4401   After Visit Summary: (Pick Up) Due to this being a telephonic visit, with patients personalized plan was offered to  patient and patient has requested to Pick up at office.  Nurse Notes: none

## 2022-10-26 NOTE — Patient Instructions (Signed)
Ms. Amanda [name] , Thank you for taking time to come for your Medicare Wellness Visit. I appreciate your ongoing commitment to your health goals. Please review the following plan we discussed and let me know if I can assist you in the future.   Referrals/Orders/Follow-Ups/Clinician Recommendations: none  This is a list of the screening recommended for you and due dates:  Health Maintenance  Topic Date Due   Flu Shot  10/18/2022   Medicare Annual Wellness Visit  10/26/2023   Mammogram  11/10/2023   DEXA scan (bone density measurement)  Completed   HPV Vaccine  Aged Out   DTaP/Tdap/Td vaccine  Discontinued   Pneumonia Vaccine  Discontinued   Pap Smear  Discontinued   Colon Cancer Screening  Discontinued   COVID-19 Vaccine  Discontinued   Hepatitis C Screening  Discontinued   Zoster (Shingles) Vaccine  Discontinued    Advanced directives: (ACP Link)Information on Advanced Care Planning can be found at [facility] Advance Health Care Directives Advance Health Care Directives ([email or web address]   Next Medicare Annual Wellness Visit scheduled for next year: Yes  Preventive Care 65 Years and Older, Female Preventive care refers to lifestyle choices and visits with your health care provider that can promote health and wellness. What does preventive care include? A yearly physical exam. This is also called an annual well check. Dental exams once or twice a year. Routine eye exams. Ask your health care provider how often you should have your eyes checked. Personal lifestyle choices, including: Daily care of your teeth and gums. Regular physical activity. Eating a healthy diet. Avoiding tobacco and drug use. Limiting alcohol use. Practicing safe sex. Taking low-dose aspirin every day. Taking vitamin and mineral supplements as recommended by your health care provider. What happens during an annual well check? The services and screenings done by your health care provider during  your annual well check will depend on your age, overall health, lifestyle risk factors, and family history of disease. Counseling  Your health care provider may ask you questions about your: Alcohol use. Tobacco use. Drug use. Emotional well-being. Home and relationship well-being. Sexual activity. Eating habits. History of falls. Memory and ability to understand (cognition). Work and work Astronomer. Reproductive health. Screening  You may have the following tests or measurements: Height, weight, and BMI. Blood pressure. Lipid and cholesterol levels. These may be checked every 5 years, or more frequently if you are over 71 years old. Skin check. Lung cancer screening. You may have this screening every year starting at age 23 if you have a 30-pack-year history of smoking and currently smoke or have quit within the past 15 years. Fecal occult blood test (FOBT) of the stool. You may have this test every year starting at age 26. Flexible sigmoidoscopy or colonoscopy. You may have a sigmoidoscopy every 5 years or a colonoscopy every 10 years starting at age 26. Hepatitis C blood test. Hepatitis B blood test. Sexually transmitted disease (STD) testing. Diabetes screening. This is done by checking your blood sugar (glucose) after you have not eaten for a while (fasting). You may have this done every 1-3 years. Bone density scan. This is done to screen for osteoporosis. You may have this done starting at age 25. Mammogram. This may be done every 1-2 years. Talk to your health care provider about how often you should have regular mammograms. Talk with your health care provider about your test results, treatment options, and if necessary, the need for more tests. Vaccines  Your health care provider may recommend certain vaccines, such as: Influenza vaccine. This is recommended every year. Tetanus, diphtheria, and acellular pertussis (Tdap, Td) vaccine. You may need a Td booster every 10  years. Zoster vaccine. You may need this after age 71. Pneumococcal 13-valent conjugate (PCV13) vaccine. One dose is recommended after age 50. Pneumococcal polysaccharide (PPSV23) vaccine. One dose is recommended after age 53. Talk to your health care provider about which screenings and vaccines you need and how often you need them. This information is not intended to replace advice given to you by your health care provider. Make sure you discuss any questions you have with your health care provider. Document Released: 04/01/2015 Document Revised: 11/23/2015 Document Reviewed: 01/04/2015 Elsevier Interactive Patient Education  2017 [employer].  Fall Prevention in the Home Falls can cause injuries. They can happen to people of all ages. There are many things you can do to make your home safe and to help prevent falls. What can I do on the outside of my home? Regularly fix the edges of walkways and driveways and fix any cracks. Remove anything that might make you trip as you walk through a door, such as a raised step or threshold. Trim any bushes or trees on the path to your home. Use bright outdoor lighting. Clear any walking paths of anything that might make someone trip, such as rocks or tools. Regularly check to see if handrails are loose or broken. Make sure that both sides of any steps have handrails. Any raised decks and porches should have guardrails on the edges. Have any leaves, snow, or ice cleared regularly. Use sand or salt on walking paths during winter. Clean up any spills in your garage right away. This includes oil or grease spills. What can I do in the bathroom? Use night lights. Install grab bars by the toilet and in the tub and shower. Do not use towel bars as grab bars. Use non-skid mats or decals in the tub or shower. If you need to sit down in the shower, use a plastic, non-slip stool. Keep the floor dry. Clean up any water that spills on the floor as soon as it  happens. Remove soap buildup in the tub or shower regularly. Attach bath mats securely with double-sided non-slip rug tape. Do not have throw rugs and other things on the floor that can make you trip. What can I do in the bedroom? Use night lights. Make sure that you have a light by your bed that is easy to reach. Do not use any sheets or blankets that are too big for your bed. They should not hang down onto the floor. Have a firm chair that has side arms. You can use this for support while you get dressed. Do not have throw rugs and other things on the floor that can make you trip. What can I do in the kitchen? Clean up any spills right away. Avoid walking on wet floors. Keep items that you use a lot in easy-to-reach places. If you need to reach something above you, use a strong step stool that has a grab bar. Keep electrical cords out of the way. Do not use floor polish or wax that makes floors slippery. If you must use wax, use non-skid floor wax. Do not have throw rugs and other things on the floor that can make you trip. What can I do with my stairs? Do not leave any items on the stairs. Make sure that there are  handrails on both sides of the stairs and use them. Fix handrails that are broken or loose. Make sure that handrails are as long as the stairways. Check any carpeting to make sure that it is firmly attached to the stairs. Fix any carpet that is loose or worn. Avoid having throw rugs at the top or bottom of the stairs. If you do have throw rugs, attach them to the floor with carpet tape. Make sure that you have a light switch at the top of the stairs and the bottom of the stairs. If you do not have them, ask someone to add them for you. What else can I do to help prevent falls? Wear shoes that: Do not have high heels. Have rubber bottoms. Are comfortable and fit you well. Are closed at the toe. Do not wear sandals. If you use a stepladder: Make sure that it is fully opened.  Do not climb a closed stepladder. Make sure that both sides of the stepladder are locked into place. Ask someone to hold it for you, if possible. Clearly mark and make sure that you can see: Any grab bars or handrails. First and last steps. Where the edge of each step is. Use tools that help you move around (mobility aids) if they are needed. These include: Canes. Walkers. Scooters. Crutches. Turn on the lights when you go into a dark area. Replace any light bulbs as soon as they burn out. Set up your furniture so you have a clear path. Avoid moving your furniture around. If any of your floors are uneven, fix them. If there are any pets around you, be aware of where they are. Review your medicines with your doctor. Some medicines can make you feel dizzy. This can increase your chance of falling. Ask your doctor what other things that you can do to help prevent falls. This information is not intended to replace advice given to you by your health care provider. Make sure you discuss any questions you have with your health care provider. Document Released: 12/30/2008 Document Revised: 08/11/2015 Document Reviewed: 04/09/2014 Elsevier Interactive Patient Education  2017 [employer].

## 2022-11-22 ENCOUNTER — Encounter: Payer: Self-pay | Admitting: Internal Medicine

## 2022-11-22 ENCOUNTER — Ambulatory Visit (INDEPENDENT_AMBULATORY_CARE_PROVIDER_SITE_OTHER): Payer: Medicare Other | Admitting: Internal Medicine

## 2022-11-22 VITALS — BP 128/76 | HR 66 | Temp 98.4°F | Ht 65.0 in | Wt 169.4 lb

## 2022-11-22 DIAGNOSIS — F325 Major depressive disorder, single episode, in full remission: Secondary | ICD-10-CM | POA: Diagnosis not present

## 2022-11-22 DIAGNOSIS — Z1231 Encounter for screening mammogram for malignant neoplasm of breast: Secondary | ICD-10-CM

## 2022-11-22 NOTE — Progress Notes (Signed)
[facility] [address] [place] [address] Dept: [phone] Dept Fax: [phone]    Subjective:   Amanda [name] 1945-07-13 11/22/2022  Chief Complaint  Patient presents with   Follow-up    HPI: Amanda [name] presents today for re-assessment and management of chronic medical conditions.  DEPRESSION: Amanda [name] presents for the medical management of depression.  Current medication regimen: prozac 10mg  - patient reports she tried medication for 3-4 weeks and did not notice any improvement so she stopped taking it. She restarted the Wellbutrin and has been taking 1 tablet once daily ( was initially prescribed BID, but evening dose kept patient from sleeping). Since restarting the wellbutrin 100mg  PO daily it has significantly helped depression.  Well controlled: yes Denies SI/HI.      11/22/2022    1:04 PM 10/26/2022    3:10 PM 10/11/2022    8:37 AM  PHQ9 SCORE ONLY  PHQ-9 Total Score 0 6 15     The following portions of the patient's history were reviewed and updated as appropriate: past medical history, past surgical history, family history, social history, allergies, medications, and problem list.   Patient Active Problem List   Diagnosis Date Noted   Infrarenal abdominal aortic aneurysm (AAA) without rupture (HCC) 09/17/2022   Hypoglycemia 08/27/2022   Shoulder pain, left 08/31/2021   Pituitary adenoma (HCC) 03/31/2021   Pituitary mass (HCC) 02/14/2021   Mixed stress and urge urinary incontinence 11/29/2020   Sensorineural hearing loss (SNHL) of both ears 10/13/2020   Hyperlipidemia 08/02/2020   PVD (peripheral vascular disease) (HCC) 08/01/2020   Peripheral arterial disease (HCC) 07/26/2020   Basal cell carcinoma (BCC) of right lower leg 06/06/2020   Major depressive disorder with single episode, in full remission (HCC) 01/22/2020   Statin intolerance 01/26/2019   Insomnia due to other mental  disorder 12/30/2018   Basal cell carcinoma (BCC) of left ala nasi 09/12/2018   Lumbar spondylosis 12/26/2017   Degeneration of lumbar intervertebral disc 07/09/2017   Lumbar post-laminectomy syndrome 07/09/2017   Herniated lumbar intervertebral disc 11/07/2016   Chronic right sacroiliac joint pain 06/08/2016   Chronic right-sided low back pain with right-sided sciatica 06/08/2016   Impingement syndrome of right shoulder 03/08/2016   Anxiety 08/29/2015   Osteoporosis 08/29/2015   CAD (coronary artery disease) 04/01/2014   Abnormal stress echocardiogram 01/29/2014   Dyspnea 01/04/2014   Essential hypertension    Melanoma (HCC)    Stricture of esophagus    Diverticulosis    Vitamin D deficiency    Hiatal hernia    Hemorrhoids    Arthritis    GERD (gastroesophageal reflux disease)    Osteopenia    Back pain 02/05/2011   Family history of malignant neoplasm of gastrointestinal tract 08/21/2010   Dysphagia, unspecified(787.20) 08/21/2010   ESOPHAGEAL REFLUX 01/11/2009   Past Medical History:  Diagnosis Date   Anxiety    Arthritis    Back pain    CAD (coronary artery disease)    a. 01/2014: cath showing 70% stenosis of non-dominant RCA --> medically managed.    Cataract    bilat removed   Chest pain 07/22/2014   DDD (degenerative disc disease), cervical    DDD (degenerative disc disease), lumbar    Depression    Diverticulosis    GERD (gastroesophageal reflux disease)    Headache    Hemorrhoids    Hiatal hernia    Hyperlipidemia    Hypertension    Melanoma (  HCC)    Orbital fracture (HCC)    right   Osteopenia 07/2015   T score -2.0 FRAX 10%/1.4%   Osteoporosis 08/29/2015   Pituitary tumor    Stricture of esophagus    Vitamin D deficiency    Past Surgical History:  Procedure Laterality Date   ABDOMINAL AORTOGRAM W/LOWER EXTREMITY N/A 08/01/2020   Procedure: ABDOMINAL AORTOGRAM W/LOWER EXTREMITY;  Surgeon: Runell Gess, MD;  Location: [facility];   Service: Cardiovascular;  Laterality: N/A;   CERVICAL DISC SURGERY     x 2   COLONOSCOPY     EXCISION OF SKIN CANCER     Melanoma   FOOT SURGERY Right    HEMORRHOID BANDING     X3   INCONTINENCE SURGERY  12/2020   LEFT HEART CATHETERIZATION WITH CORONARY ANGIOGRAM N/A 02/03/2014   Procedure: LEFT HEART CATHETERIZATION WITH CORONARY ANGIOGRAM;  Surgeon: Micheline Chapman, MD;  Location: [facility] CATH LAB;  Service: Cardiovascular;  Laterality: N/A;   LUMBAR DISC SURGERY  2018   OOPHORECTOMY  2012   BSO   PELVIC LAPAROSCOPY  2012   Diag Lap-BSO-lysis of adhesions   PERIPHERAL VASCULAR INTERVENTION Left 08/01/2020   Procedure: PERIPHERAL VASCULAR INTERVENTION;  Surgeon: Runell Gess, MD;  Location: [facility]VAS[facility] CV [facility];  Service: Cardiovascular;  Laterality: Left;   TUBAL LIGATION     VAGINAL HYSTERECTOMY  1993   Family History  Problem Relation Age of Onset   Heart disease Mother    Hypertension Mother    Stroke Mother    Colon polyps Father    Heart disease Father        CHF   Hypertension Father    Cancer Father        COLON   Colon cancer Father    Colon cancer Sister    Colon polyps Sister    Heart disease Sister    Diabetes Sister    Melanoma Sister    Heart disease Sister    Heart disease Sister    Colon polyps Brother    Diabetes Brother    Heart disease Brother    Cancer Brother        COLON   Melanoma Brother    Colon cancer Brother    Melanoma Brother    Skin cancer Brother    Skin cancer Brother    Prostate cancer Brother    Skin cancer Daughter    Hypertension Daughter    Skin cancer Daughter    Migraines Daughter    Skin cancer Son    Esophageal cancer Neg Hx    Rectal cancer Neg Hx    Stomach cancer Neg Hx    Breast cancer Neg Hx     Current Outpatient Medications:    AMBULATORY NON FORMULARY MEDICATION, TAM herbal laxative Take 2 tablet by mouth every other day as needed, Disp: , Rfl:    aspirin EC 81 MG tablet, Take 81 mg by mouth daily.  Swallow whole., Disp: , Rfl:    Cholecalciferol (VITAMIN D3) 50 MCG (2000 UT) TABS, Take 2 tablets by mouth daily., Disp: , Rfl:    diclofenac Sodium (VOLTAREN) 1 % GEL, Apply 2-4 g topically as needed., Disp: , Rfl:    diphenhydrAMINE HCl, Sleep, (SLEEP AID) 50 MG CAPS, Take 50 mg by mouth at bedtime., Disp: , Rfl:    fluticasone (FLONASE) 50 MCG/ACT nasal spray, Place 1 spray into both nostrils daily as needed for allergies., Disp: , Rfl:  hydrochlorothiazide (HYDRODIURIL) 25 MG tablet, Take 1 tablet (25 mg total) by mouth daily., Disp: 90 tablet, Rfl: 3   HYDROcodone-acetaminophen (NORCO) 10-325 MG tablet, Take 1 tablet by mouth every 6 (six) hours as needed (Back pain)., Disp: , Rfl:    LORazepam (ATIVAN) 0.5 MG tablet, Take 1-2 tablets by mouth once daily as needed for anxiety., Disp: 60 tablet, Rfl: 2   losartan (COZAAR) 100 MG tablet, Take 1 tablet (100 mg total) by mouth at bedtime., Disp: 90 tablet, Rfl: 3   methocarbamol (ROBAXIN) 500 MG tablet, Take 1 tablet (500 mg total) by mouth every 8 (eight) hours as needed for muscle spasms., Disp: 30 tablet, Rfl: 0   ondansetron (ZOFRAN) 4 MG tablet, Take 1 tablet (4 mg total) by mouth every 8 (eight) hours as needed for nausea or vomiting., Disp: 20 tablet, Rfl: 0   pantoprazole (PROTONIX) 40 MG tablet, Take 1 tablet (40 mg total) by mouth 2 (two) times daily before a meal., Disp: 180 tablet, Rfl: 0   spironolactone (ALDACTONE) 25 MG tablet, Take 1 tablet (25 mg total) by mouth daily., Disp: 90 tablet, Rfl: 2 Allergies  Allergen Reactions   Ciprofloxacin Rash   Lisinopril Swelling   Pneumococcal Vaccine Swelling   Pravastatin Other (See Comments)    Muscle aches    Crestor [Rosuvastatin]     myalgias   Evolocumab Other (See Comments)    cramping   Lipitor [Atorvastatin]     myalgias   Lubiprostone     Vomiting    Norvasc [Amlodipine]     swelling   Oxycodone Other (See Comments)   Zetia [Ezetimibe]     "felt horrible"      ROS: A complete ROS was performed with pertinent positives/negatives noted in the HPI. The remainder of the ROS are negative.    Objective:   Today's Vitals   11/22/22 1305  BP: 128/76  Pulse: 66  Temp: 98.4 F (36.9 C)  TempSrc: Temporal  SpO2: 95%  Weight: 169 lb 6.6 oz (76.8 kg)  Height: 5\' 5"  (1.651 m)   GENERAL: Well-appearing, in NAD. Well nourished.  SKIN: Pink, warm and dry.  NECK: Trachea midline. Full ROM w/o pain or tenderness. No lymphadenopathy.  RESPIRATORY: Chest wall symmetrical. Respirations even and non-labored. Breath sounds clear to auscultation bilaterally.  CARDIAC: S1, S2 present, regular rate and rhythm. Peripheral pulses 2+ bilaterally.  EXTREMITIES: Without clubbing, cyanosis, or edema.  NEUROLOGIC: Steady, even gait.  PSYCH/MENTAL STATUS: Alert, oriented x 3. Cooperative, appropriate mood and affect.   There are no preventive care reminders to display for this patient.   No results found for any visits on 11/22/22.  The 10-year ASCVD risk score (Arnett DK, et al., 2019) is: 19.5%     Assessment & Plan:  1. Major depressive disorder with single episode, in full remission (HCC) - d/c prozac  - continue wellbutrin 100mg  PO daily   2. Breast cancer screening by mammogram - [facility] 3D SCREENING MAMMOGRAM BILATERAL BREAST; Future  Orders Placed This Encounter  Procedures   MM 3D SCREENING MAMMOGRAM BILATERAL BREAST    Standing Status:   Future    Standing Expiration Date:   11/22/2023    Order Specific Question:   Reason for Exam (SYMPTOM  OR DIAGNOSIS REQUIRED)    Answer:   screening for breast cancer    Order Specific Question:   Preferred imaging location?    Answer:   [facility]   No images are attached to the encounter  or orders placed in the encounter. No orders of the defined types were placed in this encounter.   Return for Scheduled Routine Office Visits and as needed.   [name], FNP

## 2022-12-04 DIAGNOSIS — M7541 Impingement syndrome of right shoulder: Secondary | ICD-10-CM | POA: Diagnosis not present

## 2022-12-12 ENCOUNTER — Ambulatory Visit: Payer: Medicare Other | Admitting: Gastroenterology

## 2022-12-12 ENCOUNTER — Encounter: Payer: Self-pay | Admitting: Gastroenterology

## 2022-12-12 VITALS — BP 124/80 | HR 74 | Ht 65.0 in | Wt 166.0 lb

## 2022-12-12 DIAGNOSIS — K5909 Other constipation: Secondary | ICD-10-CM | POA: Diagnosis not present

## 2022-12-12 DIAGNOSIS — R1310 Dysphagia, unspecified: Secondary | ICD-10-CM | POA: Diagnosis not present

## 2022-12-12 DIAGNOSIS — K219 Gastro-esophageal reflux disease without esophagitis: Secondary | ICD-10-CM

## 2022-12-12 DIAGNOSIS — K449 Diaphragmatic hernia without obstruction or gangrene: Secondary | ICD-10-CM

## 2022-12-12 NOTE — Progress Notes (Deleted)
[facility] Progress Note:  History: Amanda [name] 12/12/2022  Referring provider: [name], FNP  Reason for consult/chief complaint: Abdominal Pain (Pt states she is doing well today, pt states her abd pain is better) and Gastroesophageal Reflux (Pt states her acid reflux is better)   Subjective  HPI: Amanda [name] follows up after her office appointment in May 2024 for reflux symptoms with dysphagia as well as constipation at least partially related to opioid use, on MiraLAX.  EGD 09/06/2022 revealed 2 to 3 cm hiatal hernia with some mild distal esophageal tortuosity, small amount of bilious fluid in the stomach and otherwise normal exam.  Colonoscopy with melanosis, diverticulosis, 2 subcentimeter tubular adenomas. While the patient did not clearly have gastroparesis, I felt that the possible side effect of opioids may be causing some delayed gastric emptying contributing to her upper digestive symptoms, and I recommended a gastroparesis type diet. _____________   ***   ROS:  Review of Systems   Past Medical History: Past Medical History:  Diagnosis Date   Anxiety    Arthritis    Back pain    CAD (coronary artery disease)    a. 01/2014: cath showing 70% stenosis of non-dominant RCA --> medically managed.    Cataract    bilat removed   Chest pain 07/22/2014   DDD (degenerative disc disease), cervical    DDD (degenerative disc disease), lumbar    Depression    Diverticulosis    GERD (gastroesophageal reflux disease)    Headache    Hemorrhoids    Hiatal hernia    Hyperlipidemia    Hypertension    Melanoma (HCC)    Orbital fracture (HCC)    right   Osteopenia 07/2015   T score -2.0 FRAX 10%/1.4%   Osteoporosis 08/29/2015   Pituitary tumor    Stricture of esophagus    Vitamin D deficiency      Past Surgical History: Past Surgical History:  Procedure Laterality Date   ABDOMINAL AORTOGRAM W/LOWER EXTREMITY N/A 08/01/2020   Procedure:  ABDOMINAL AORTOGRAM W/LOWER EXTREMITY;  Surgeon: Runell Gess, MD;  Location: [facility];  Service: Cardiovascular;  Laterality: N/A;   CERVICAL DISC SURGERY     x 2   COLONOSCOPY     EXCISION OF SKIN CANCER     Melanoma   FOOT SURGERY Right    HEMORRHOID BANDING     X3   INCONTINENCE SURGERY  12/2020   LEFT HEART CATHETERIZATION WITH CORONARY ANGIOGRAM N/A 02/03/2014   Procedure: LEFT HEART CATHETERIZATION WITH CORONARY ANGIOGRAM;  Surgeon: Micheline Chapman, MD;  Location: [facility];  Service: Cardiovascular;  Laterality: N/A;   LUMBAR DISC SURGERY  2018   OOPHORECTOMY  2012   BSO   PELVIC LAPAROSCOPY  2012   Diag Lap-BSO-lysis of adhesions   PERIPHERAL VASCULAR INTERVENTION Left 08/01/2020   Procedure: PERIPHERAL VASCULAR INTERVENTION;  Surgeon: Runell Gess, MD;  Location: [facility];  Service: Cardiovascular;  Laterality: Left;   TUBAL LIGATION     VAGINAL HYSTERECTOMY  1993     Family History: Family History  Problem Relation Age of Onset   Heart disease Mother    Hypertension Mother    Stroke Mother    Colon polyps Father    Heart disease Father        CHF   Hypertension Father    Cancer Father        COLON   Colon cancer Father  Colon cancer Sister    Colon polyps Sister    Heart disease Sister    Diabetes Sister    Melanoma Sister    Heart disease Sister    Heart disease Sister    Colon polyps Brother    Diabetes Brother    Heart disease Brother    Cancer Brother        COLON   Melanoma Brother    Colon cancer Brother    Melanoma Brother    Skin cancer Brother    Skin cancer Brother    Prostate cancer Brother    Skin cancer Daughter    Hypertension Daughter    Skin cancer Daughter    Migraines Daughter    Skin cancer Son    Esophageal cancer Neg Hx    Rectal cancer Neg Hx    Stomach cancer Neg Hx    Breast cancer Neg Hx     Social History: Social History   Socioeconomic History   Marital status: Widowed     Spouse name: Not on file   Number of children: 3   Years of education: Not on file   Highest education level: 10th grade  Occupational History   Occupation: Disabled/retired  Tobacco Use   Smoking status: Former    Current packs/day: 0.00    Types: Cigarettes    Start date: 03/19/1974    Quit date: 03/19/1994    Years since quitting: 28.7   Smokeless tobacco: Never  Vaping Use   Vaping status: Never Used  Substance and Sexual Activity   Alcohol use: Not Currently    Comment: not much when younger, nothing for years   Drug use: Yes    Types: Hydrocodone   Sexual activity: Not Currently    Birth control/protection: Surgical    Comment: 1st intercourse 77 yo-Fewer than 5 partners,des neg  Other Topics Concern   Not on file  Social History Narrative   Lives with daughter    Some caffeine    Social Determinants of Health   Financial Resource Strain: Low Risk  (10/26/2022)   Overall Financial Resource Strain (CARDIA)    Difficulty of Paying Living Expenses: Not hard at all  Food Insecurity: No Food Insecurity (10/26/2022)   Hunger Vital Sign    Worried About Running Out of Food in the Last Year: Never true    Ran Out of Food in the Last Year: Never true  Transportation Needs: No Transportation Needs (10/26/2022)   PRAPARE - Administrator, Civil Service (Medical): No    Lack of Transportation (Non-Medical): No  Physical Activity: Sufficiently Active (10/26/2022)   Exercise Vital Sign    Days of Exercise per Week: 6 days    Minutes of Exercise per Session: 30 min  Stress: No Stress Concern Present (10/26/2022)   [employer] of Occupational Health - Occupational Stress Questionnaire    Feeling of Stress : Only a little  Social Connections: Moderately Isolated (10/26/2022)   Social Connection and Isolation Panel [NHANES]    Frequency of Communication with Friends and Family: More than three times a week    Frequency of Social Gatherings with Friends and Family: More than  three times a week    Attends Religious Services: More than 4 times per year    Active Member of [employer] or Organizations: No    Attends Banker Meetings: Never    Marital Status: Widowed    Allergies: Allergies  Allergen Reactions   Ciprofloxacin Rash  Lisinopril Swelling   Pneumococcal Vaccine Swelling   Pravastatin Other (See Comments)    Muscle aches    Crestor [Rosuvastatin]     myalgias   Evolocumab Other (See Comments)    cramping   Lipitor [Atorvastatin]     myalgias   Lubiprostone     Vomiting    Norvasc [Amlodipine]     swelling   Oxycodone Other (See Comments)   Zetia [Ezetimibe]     "felt horrible"    Outpatient Meds: Current Outpatient Medications  Medication Sig Dispense Refill   AMBULATORY NON FORMULARY MEDICATION TAM herbal laxative Take 2 tablet by mouth every other day as needed     aspirin EC 81 MG tablet Take 81 mg by mouth daily. Swallow whole.     Cholecalciferol (VITAMIN D3) 50 MCG (2000 UT) TABS Take 2 tablets by mouth daily.     diclofenac Sodium (VOLTAREN) 1 % GEL Apply 2-4 g topically as needed.     diphenhydrAMINE HCl, Sleep, (SLEEP AID) 50 MG CAPS Take 50 mg by mouth at bedtime.     fluticasone (FLONASE) 50 MCG/ACT nasal spray Place 1 spray into both nostrils daily as needed for allergies.     hydrochlorothiazide (HYDRODIURIL) 25 MG tablet Take 1 tablet (25 mg total) by mouth daily. 90 tablet 3   HYDROcodone-acetaminophen (NORCO) 10-325 MG tablet Take 1 tablet by mouth every 6 (six) hours as needed (Back pain).     LORazepam (ATIVAN) 0.5 MG tablet Take 1-2 tablets by mouth once daily as needed for anxiety. 60 tablet 2   losartan (COZAAR) 100 MG tablet Take 1 tablet (100 mg total) by mouth at bedtime. 90 tablet 3   methocarbamol (ROBAXIN) 500 MG tablet Take 1 tablet (500 mg total) by mouth every 8 (eight) hours as needed for muscle spasms. 30 tablet 0   ondansetron (ZOFRAN) 4 MG tablet Take 1 tablet (4 mg total) by mouth every 8  (eight) hours as needed for nausea or vomiting. 20 tablet 0   pantoprazole (PROTONIX) 40 MG tablet Take 1 tablet (40 mg total) by mouth 2 (two) times daily before a meal. 180 tablet 0   spironolactone (ALDACTONE) 25 MG tablet Take 1 tablet (25 mg total) by mouth daily. (Patient not taking: Reported on 12/12/2022) 90 tablet 2   No current facility-administered medications for this visit.      ___________________________________________________________________ Objective   Exam:  BP 124/80   Pulse 74   Ht 5\' 5"  (1.651 m)   Wt 166 lb (75.3 kg)   BMI 27.62 kg/m  Wt Readings from Last 3 Encounters:  12/12/22 166 lb (75.3 kg)  11/22/22 169 lb 6.6 oz (76.8 kg)  10/11/22 170 lb 6.4 oz (77.3 kg)    General: ***  Eyes: sclera anicteric, no redness ENT: oral mucosa moist without lesions, no cervical or supraclavicular lymphadenopathy CV: ***, no JVD, no peripheral edema Resp: clear to auscultation bilaterally, normal RR and effort noted GI: soft, *** tenderness, with active bowel sounds. No guarding or palpable organomegaly noted. Skin; warm and dry, no rash or jaundice noted Neuro: awake, alert and oriented x 3. Normal gross motor function and fluent speech  Labs:  ***  Radiologic Studies:  CLINICAL DATA:  Abdominal pain, acute, nonlocalized AB pain, bloating, nausea, vomiting   EXAM: CT ABDOMEN AND PELVIS WITH CONTRAST   TECHNIQUE: Multidetector CT imaging of the abdomen and pelvis was performed using the standard protocol following bolus administration of intravenous contrast.   RADIATION DOSE  REDUCTION: This exam was performed according to the departmental dose-optimization program which includes automated exposure control, adjustment of the mA and/or kV according to patient size and/or use of iterative reconstruction technique.   CONTRAST:  60mL OMNIPAQUE IOHEXOL 350 MG/ML SOLN   COMPARISON:  CT scan abdomen and pelvis from 06/14/2006 and CT scan angiography chest  from 07/22/2014.   FINDINGS: Lower chest: There are subpleural atelectatic changes in the visualized lung bases. No overt consolidation. No pleural effusion. The heart is normal in size. No pericardial effusion.   Hepatobiliary: The liver is normal in size. Non-cirrhotic configuration. No suspicious mass. Note is made of a subcapsular 7 x 11 mm simple cyst in the right hepatic dome. No intrahepatic or extrahepatic bile duct dilation. No calcified gallstones. Normal gallbladder wall thickness. No pericholecystic inflammatory changes.   Pancreas: Unremarkable. No pancreatic ductal dilatation or surrounding inflammatory changes.   Spleen: Within normal limits. No focal lesion.   Adrenals/Urinary Tract: Adrenal glands are unremarkable. No suspicious renal mass. No hydronephrosis. No renal or ureteric calculi. Unremarkable urinary bladder.   Stomach/Bowel: There is a small hiatal hernia. No disproportionate dilation of the small or large bowel loops. No evidence of abnormal bowel wall thickening or inflammatory changes. The appendix is unremarkable. There are multiple diverticula mainly in the sigmoid colon, without imaging signs of diverticulitis.   Vascular/Lymphatic: No ascites or pneumoperitoneum. No abdominal or pelvic lymphadenopathy, by size criteria. There is ectasia of infrarenal aorta measuring up to 2.7 x 2.7 cm orthogonally on axial plane. There is mild peripheral intraluminal thrombus/hematoma. No aneurysmal dilation of the other major abdominal arteries. There are moderate peripheral atherosclerotic vascular calcifications of the aorta and its major branches.   Reproductive: The uterus is surgically absent. No large adnexal mass.   Other: There is a tiny fat containing umbilical hernia. The soft tissues and abdominal wall are otherwise unremarkable.   Musculoskeletal: No suspicious osseous lesions. There are moderate - marked multilevel degenerative changes in the  visualized spine.   IMPRESSION: 1. No acute inflammatory process identified in the abdomen or pelvis. 2. Multiple other nonacute observations, as described above. 3. Infrarenal abdominal aortic ectasia measuring up to 2.7 cm. Recommend follow-up ultrasound every 5 years.   Aortic Atherosclerosis (ICD10-I70.0).     Electronically Signed   By: Jules Schick M.D.   On: 09/14/2022 12:08  Assessment: No diagnosis found.  ***  Plan:  ***  Thank you for the courtesy of this consult.  Please call me with any questions or concerns.  Charlie Pitter III  CC: Referring provider noted above

## 2022-12-12 NOTE — Patient Instructions (Signed)
_______________________________________________________  If your blood pressure at your visit was 140/90 or greater, please contact your primary care physician to follow up on this.  _______________________________________________________  If you are age 75 or older, your body mass index should be between 23-30. Your Body mass index is 27.62 kg/m. If this is out of the aforementioned range listed, please consider follow up with your Primary Care Provider.  If you are age 91 or younger, your body mass index should be between 19-25. Your Body mass index is 27.62 kg/m. If this is out of the aformentioned range listed, please consider follow up with your Primary Care Provider.   ________________________________________________________  The [facility] providers would like to encourage you to use [facility] to communicate with providers for non-urgent requests or questions.  Due to long hold times on the telephone, sending your provider a message by [facility] may be a faster and more efficient way to get a response.  Please allow 48 business hours for a response.  Please remember that this is for non-urgent requests.  _______________________________________________________  Follow up as needed.   It was a pleasure to see you today!  Thank you for trusting me with your gastrointestinal care!

## 2022-12-12 NOTE — Progress Notes (Signed)
[facility] Progress Note:  History: Amanda [name] 12/12/2022  Referring provider: [name], FNP  Reason for consult/chief complaint: Abdominal Pain (Pt states she is doing well today, pt states her abd pain is better) and Gastroesophageal Reflux (Pt states her acid reflux is better)   Subjective  HPI: Amanda [name] follows up after her office appointment in May 2024 for reflux symptoms with dysphagia as well as constipation at least partially related to opioid use, on MiraLAX.  EGD 09/06/2022 revealed 2 to 3 cm hiatal hernia with some mild distal esophageal tortuosity, small amount of bilious fluid in the stomach and otherwise normal exam.  Colonoscopy with melanosis, diverticulosis, 2 subcentimeter tubular adenomas. While the patient did not clearly have gastroparesis, I felt that the possible side effect of opioids may be causing some delayed gastric emptying contributing to her upper digestive symptoms, and I recommended a gastroparesis type diet. _____________  Today, she reports feeling well overall. She states that her reflux symptoms have greatly improved. She has been compliant with her pantoprazole 40 mg twice a day. She was increased to twice daily few months ago after her visit with PA Steffanie Dunn.She also states her dysphagia symptoms have resolved and she denies any vomiting or nausea.  No dysphagia  She reports typically having normal BM. She had discontinued Miralax and started taking Magnesium to help relief her symptoms. She has not been following any particular dietary restrictions.  We also reviewed her recent colonoscopy and discussed any further recommendation regarding her hernia and polyps.     ROS:  Review of Systems  Constitutional:  Negative for appetite change and fever.  HENT:  Negative for trouble swallowing.   Respiratory:  Negative for cough and shortness of breath.   Cardiovascular:  Negative for chest pain.  Gastrointestinal:   Negative for abdominal distention, abdominal pain, anal bleeding, blood in stool, constipation, diarrhea, nausea, rectal pain and vomiting.       +reflux  Genitourinary:  Negative for dysuria.  Musculoskeletal:  Negative for back pain.  Skin:  Negative for rash.  Neurological:  Negative for weakness.  All other systems reviewed and are negative.    Past Medical History: Past Medical History:  Diagnosis Date   Anxiety    Arthritis    Back pain    CAD (coronary artery disease)    a. 01/2014: cath showing 70% stenosis of non-dominant RCA --> medically managed.    Cataract    bilat removed   Chest pain 07/22/2014   DDD (degenerative disc disease), cervical    DDD (degenerative disc disease), lumbar    Depression    Diverticulosis    GERD (gastroesophageal reflux disease)    Headache    Hemorrhoids    Hiatal hernia    Hyperlipidemia    Hypertension    Melanoma (HCC)    Orbital fracture (HCC)    right   Osteopenia 07/2015   T score -2.0 FRAX 10%/1.4%   Osteoporosis 08/29/2015   Pituitary tumor    Stricture of esophagus    Vitamin D deficiency      Past Surgical History: Past Surgical History:  Procedure Laterality Date   ABDOMINAL AORTOGRAM W/LOWER EXTREMITY N/A 08/01/2020   Procedure: ABDOMINAL AORTOGRAM W/LOWER EXTREMITY;  Surgeon: Runell Gess, MD;  Location: [facility];  Service: Cardiovascular;  Laterality: N/A;   CERVICAL DISC SURGERY     x 2   COLONOSCOPY     EXCISION OF SKIN CANCER  Melanoma   FOOT SURGERY Right    HEMORRHOID BANDING     X3   INCONTINENCE SURGERY  12/2020   LEFT HEART CATHETERIZATION WITH CORONARY ANGIOGRAM N/A 02/03/2014   Procedure: LEFT HEART CATHETERIZATION WITH CORONARY ANGIOGRAM;  Surgeon: Micheline Chapman, MD;  Location: [facility] CATH LAB;  Service: Cardiovascular;  Laterality: N/A;   LUMBAR DISC SURGERY  2018   OOPHORECTOMY  2012   BSO   PELVIC LAPAROSCOPY  2012   Diag Lap-BSO-lysis of adhesions   PERIPHERAL VASCULAR  INTERVENTION Left 08/01/2020   Procedure: PERIPHERAL VASCULAR INTERVENTION;  Surgeon: Runell Gess, MD;  Location: [facility];  Service: Cardiovascular;  Laterality: Left;   TUBAL LIGATION     VAGINAL HYSTERECTOMY  1993     Family History: Family History  Problem Relation Age of Onset   Heart disease Mother    Hypertension Mother    Stroke Mother    Colon polyps Father    Heart disease Father        CHF   Hypertension Father    Cancer Father        COLON   Colon cancer Father    Colon cancer Sister    Colon polyps Sister    Heart disease Sister    Diabetes Sister    Melanoma Sister    Heart disease Sister    Heart disease Sister    Colon polyps Brother    Diabetes Brother    Heart disease Brother    Cancer Brother        COLON   Melanoma Brother    Colon cancer Brother    Melanoma Brother    Skin cancer Brother    Skin cancer Brother    Prostate cancer Brother    Skin cancer Daughter    Hypertension Daughter    Skin cancer Daughter    Migraines Daughter    Skin cancer Son    Esophageal cancer Neg Hx    Rectal cancer Neg Hx    Stomach cancer Neg Hx    Breast cancer Neg Hx     Social History: Social History   Socioeconomic History   Marital status: Widowed    Spouse name: Not on file   Number of children: 3   Years of education: Not on file   Highest education level: 10th grade  Occupational History   Occupation: Disabled/retired  Tobacco Use   Smoking status: Former    Current packs/day: 0.00    Types: Cigarettes    Start date: 03/19/1974    Quit date: 03/19/1994    Years since quitting: 28.7   Smokeless tobacco: Never  Vaping Use   Vaping status: Never Used  Substance and Sexual Activity   Alcohol use: Not Currently    Comment: not much when younger, nothing for years   Drug use: Yes    Types: Hydrocodone   Sexual activity: Not Currently    Birth control/protection: Surgical    Comment: 1st intercourse 77 yo-Fewer than 5  partners,des neg  Other Topics Concern   Not on file  Social History Narrative   Lives with daughter    Some caffeine    Social Determinants of Health   Financial Resource Strain: Low Risk  (10/26/2022)   Overall Financial Resource Strain (CARDIA)    Difficulty of Paying Living Expenses: Not hard at all  Food Insecurity: No Food Insecurity (10/26/2022)   Hunger Vital Sign    Worried About Running Out of Food  in the Last Year: Never true    Ran Out of Food in the Last Year: Never true  Transportation Needs: No Transportation Needs (10/26/2022)   PRAPARE - Administrator, Civil Service (Medical): No    Lack of Transportation (Non-Medical): No  Physical Activity: Sufficiently Active (10/26/2022)   Exercise Vital Sign    Days of Exercise per Week: 6 days    Minutes of Exercise per Session: 30 min  Stress: No Stress Concern Present (10/26/2022)   [employer] of Occupational Health - Occupational Stress Questionnaire    Feeling of Stress : Only a little  Social Connections: Moderately Isolated (10/26/2022)   Social Connection and Isolation Panel [NHANES]    Frequency of Communication with Friends and Family: More than three times a week    Frequency of Social Gatherings with Friends and Family: More than three times a week    Attends Religious Services: More than 4 times per year    Active Member of [employer] or Organizations: No    Attends Banker Meetings: Never    Marital Status: Widowed    Allergies: Allergies  Allergen Reactions   Ciprofloxacin Rash   Lisinopril Swelling   Pneumococcal Vaccine Swelling   Pravastatin Other (See Comments)    Muscle aches    Crestor [Rosuvastatin]     myalgias   Evolocumab Other (See Comments)    cramping   Lipitor [Atorvastatin]     myalgias   Lubiprostone     Vomiting    Norvasc [Amlodipine]     swelling   Oxycodone Other (See Comments)   Zetia [Ezetimibe]     "felt horrible"    Outpatient Meds: Current  Outpatient Medications  Medication Sig Dispense Refill   AMBULATORY NON FORMULARY MEDICATION TAM herbal laxative Take 2 tablet by mouth every other day as needed     aspirin EC 81 MG tablet Take 81 mg by mouth daily. Swallow whole.     Cholecalciferol (VITAMIN D3) 50 MCG (2000 UT) TABS Take 2 tablets by mouth daily.     diclofenac Sodium (VOLTAREN) 1 % GEL Apply 2-4 g topically as needed.     diphenhydrAMINE HCl, Sleep, (SLEEP AID) 50 MG CAPS Take 50 mg by mouth at bedtime.     fluticasone (FLONASE) 50 MCG/ACT nasal spray Place 1 spray into both nostrils daily as needed for allergies.     hydrochlorothiazide (HYDRODIURIL) 25 MG tablet Take 1 tablet (25 mg total) by mouth daily. 90 tablet 3   HYDROcodone-acetaminophen (NORCO) 10-325 MG tablet Take 1 tablet by mouth every 6 (six) hours as needed (Back pain).     LORazepam (ATIVAN) 0.5 MG tablet Take 1-2 tablets by mouth once daily as needed for anxiety. 60 tablet 2   losartan (COZAAR) 100 MG tablet Take 1 tablet (100 mg total) by mouth at bedtime. 90 tablet 3   methocarbamol (ROBAXIN) 500 MG tablet Take 1 tablet (500 mg total) by mouth every 8 (eight) hours as needed for muscle spasms. 30 tablet 0   ondansetron (ZOFRAN) 4 MG tablet Take 1 tablet (4 mg total) by mouth every 8 (eight) hours as needed for nausea or vomiting. 20 tablet 0   pantoprazole (PROTONIX) 40 MG tablet Take 1 tablet (40 mg total) by mouth 2 (two) times daily before a meal. 180 tablet 0   spironolactone (ALDACTONE) 25 MG tablet Take 1 tablet (25 mg total) by mouth daily. (Patient not taking: Reported on 12/12/2022) 90 tablet 2  No current facility-administered medications for this visit.      ___________________________________________________________________ Objective   Exam:  BP 124/80   Pulse 74   Ht 5\' 5"  (1.651 m)   Wt 166 lb (75.3 kg)   BMI 27.62 kg/m  Wt Readings from Last 3 Encounters:  12/12/22 166 lb (75.3 kg)  11/22/22 169 lb 6.6 oz (76.8 kg)  10/11/22  170 lb 6.4 oz (77.3 kg)    Well-appearing No additional exam-entire visit spent in review of symptoms, findings and plan.  Labs:  Radiologic Studies:  CLINICAL DATA:  Abdominal pain, acute, nonlocalized AB pain, bloating, nausea, vomiting   EXAM: CT ABDOMEN AND PELVIS WITH CONTRAST   TECHNIQUE: Multidetector CT imaging of the abdomen and pelvis was performed using the standard protocol following bolus administration of intravenous contrast.   RADIATION DOSE REDUCTION: This exam was performed according to the departmental dose-optimization program which includes automated exposure control, adjustment of the mA and/or kV according to patient size and/or use of iterative reconstruction technique.   CONTRAST:  60mL OMNIPAQUE IOHEXOL 350 MG/ML SOLN   COMPARISON:  CT scan abdomen and pelvis from 06/14/2006 and CT scan angiography chest from 07/22/2014.   FINDINGS: Lower chest: There are subpleural atelectatic changes in the visualized lung bases. No overt consolidation. No pleural effusion. The heart is normal in size. No pericardial effusion.   Hepatobiliary: The liver is normal in size. Non-cirrhotic configuration. No suspicious mass. Note is made of a subcapsular 7 x 11 mm simple cyst in the right hepatic dome. No intrahepatic or extrahepatic bile duct dilation. No calcified gallstones. Normal gallbladder wall thickness. No pericholecystic inflammatory changes.   Pancreas: Unremarkable. No pancreatic ductal dilatation or surrounding inflammatory changes.   Spleen: Within normal limits. No focal lesion.   Adrenals/Urinary Tract: Adrenal glands are unremarkable. No suspicious renal mass. No hydronephrosis. No renal or ureteric calculi. Unremarkable urinary bladder.   Stomach/Bowel: There is a small hiatal hernia. No disproportionate dilation of the small or large bowel loops. No evidence of abnormal bowel wall thickening or inflammatory changes. The appendix  is unremarkable. There are multiple diverticula mainly in the sigmoid colon, without imaging signs of diverticulitis.   Vascular/Lymphatic: No ascites or pneumoperitoneum. No abdominal or pelvic lymphadenopathy, by size criteria. There is ectasia of infrarenal aorta measuring up to 2.7 x 2.7 cm orthogonally on axial plane. There is mild peripheral intraluminal thrombus/hematoma. No aneurysmal dilation of the other major abdominal arteries. There are moderate peripheral atherosclerotic vascular calcifications of the aorta and its major branches.   Reproductive: The uterus is surgically absent. No large adnexal mass.   Other: There is a tiny fat containing umbilical hernia. The soft tissues and abdominal wall are otherwise unremarkable.   Musculoskeletal: No suspicious osseous lesions. There are moderate - marked multilevel degenerative changes in the visualized spine.   IMPRESSION: 1. No acute inflammatory process identified in the abdomen or pelvis. 2. Multiple other nonacute observations, as described above. 3. Infrarenal abdominal aortic ectasia measuring up to 2.7 cm. Recommend follow-up ultrasound every 5 years.   Aortic Atherosclerosis (ICD10-I70.0).     Electronically Signed   By: Jules Schick M.D.   On: 09/14/2022 12:08  Assessment: Gastroesophageal reflux disease without esophagitis  Hiatal hernia  Chronic constipation  Chronic stable constipation doing well on current management  Improved reflux symptoms with twice daily PPI.  Needs attention to diet and lifestyle measures which we reviewed.  Most important would be not eating within 3 to 4 hours of  bedtime since she often had nocturnal symptoms. Dysphagia was at least partially from some distal tortuosity due to hiatal hernia and a probable reflux related dysmotility that is likely now improved on higher dose acid suppression.  I would not recommend surgical treatment for this hiatal hernia, at least not at  this juncture.    Plan: I would like to try weaning back her acid suppression to once daily, however if she has escalation of symptoms then we may need to return to twice daily dosing.  Work on antireflux diet and lifestyle measures  Contact us if she has escalation of her reflux symptoms and we will proceed accordingly.  Thank you for the courtesy of this consult.  Please call me with any questions or concerns.  Derrill Kay  CC: Referring provider noted above

## 2022-12-26 ENCOUNTER — Ambulatory Visit
Admission: RE | Admit: 2022-12-26 | Discharge: 2022-12-26 | Disposition: A | Discharge status: Home / Self Care | Payer: Medicare Other | Source: Ambulatory Visit | Attending: Internal Medicine | Admitting: Internal Medicine

## 2022-12-26 DIAGNOSIS — Z1231 Encounter for screening mammogram for malignant neoplasm of breast: Secondary | ICD-10-CM | POA: Diagnosis not present

## 2023-01-11 ENCOUNTER — Other Ambulatory Visit: Payer: Self-pay | Admitting: Physician Assistant

## 2023-01-11 DIAGNOSIS — K219 Gastro-esophageal reflux disease without esophagitis: Secondary | ICD-10-CM

## 2023-01-14 ENCOUNTER — Encounter: Payer: Self-pay | Admitting: Internal Medicine

## 2023-01-14 ENCOUNTER — Ambulatory Visit (INDEPENDENT_AMBULATORY_CARE_PROVIDER_SITE_OTHER): Payer: Medicare Other | Admitting: Internal Medicine

## 2023-01-14 VITALS — BP 134/72 | HR 74 | Temp 98.0°F | Ht 65.0 in | Wt 169.4 lb

## 2023-01-14 DIAGNOSIS — F419 Anxiety disorder, unspecified: Secondary | ICD-10-CM | POA: Diagnosis not present

## 2023-01-14 DIAGNOSIS — K219 Gastro-esophageal reflux disease without esophagitis: Secondary | ICD-10-CM

## 2023-01-14 DIAGNOSIS — I10 Essential (primary) hypertension: Secondary | ICD-10-CM | POA: Diagnosis not present

## 2023-01-14 LAB — COMPREHENSIVE METABOLIC PANEL
ALT: 10 U/L (ref 0–35)
AST: 15 U/L (ref 0–37)
Albumin: 4.1 g/dL (ref 3.5–5.2)
Alkaline Phosphatase: 70 U/L (ref 39–117)
BUN: 10 mg/dL (ref 6–23)
CO2: 29 meq/L (ref 19–32)
Calcium: 8.8 mg/dL (ref 8.4–10.5)
Chloride: 100 meq/L (ref 96–112)
Creatinine, Ser: 0.96 mg/dL (ref 0.40–1.20)
GFR: 58.09 mL/min — ABNORMAL LOW (ref >60.00)
Glucose, Bld: 80 mg/dL (ref 70–99)
Potassium: 3.7 meq/L (ref 3.5–5.1)
Sodium: 137 meq/L (ref 135–145)
Total Bilirubin: 0.5 mg/dL (ref 0.2–1.2)
Total Protein: 6.3 g/dL (ref 6.0–8.3)

## 2023-01-14 MED ORDER — PANTOPRAZOLE SODIUM 40 MG PO TBEC
40.0000 mg | DELAYED_RELEASE_TABLET | Freq: Every day | ORAL | Status: DC
Start: 2023-01-14 — End: 2023-05-13

## 2023-01-14 NOTE — Progress Notes (Signed)
[facility] [address] [place] [address] Dept: [phone] Dept Fax: [phone]    Subjective:   Amanda [name] [birth year] 01/14/2023  Chief Complaint  Patient presents with   Follow-up    HPI: Amanda [name] presents today for re-assessment and management of chronic medical conditions.  HYPERTENSION: Amanda [name] presents for the medical management of hypertension.  Patient's current hypertension medication regimen is: Losartan 100mg  PO daily, hydrochlorothiazide 25mg  po daily Patient is  currently taking prescribed medications for HTN.  Denies headache, CP, SHOB, vision changes.   BP Readings from Last 3 Encounters:  01/14/23 134/72  12/12/22 124/80  11/22/22 128/76   ANXIETY: Amanda [name] presents for the medical management of anxiety.  Current medication regimen: Ativan PO PRN . Patient stopped taking Prozac due to muscle cramps. She states her anxiety is well controlled currently without daily medication.  Well controlled: yes Denies SI/HI.     10/11/2022    9:21 AM 12/30/2018    2:04 PM  GAD 7 : Generalized Anxiety Score  Nervous, Anxious, on Edge 3 1  Control/stop worrying 3 1  Worry too much - different things 2 1  Trouble relaxing 2 2  Restless 2 1  Easily annoyed or irritable 1 1  Afraid - awful might happen 2 2  Total GAD 7 Score 15 9  Anxiety Difficulty Somewhat difficult Somewhat difficult      01/14/2023    1:07 PM 11/22/2022    1:04 PM 10/26/2022    3:10 PM  PHQ9 SCORE ONLY  PHQ-9 Total Score 0 0 6   GERD: Amanda [name] presents for the medical management of GERD.  Current medication: Protonix 40mg  PO daily - recently dose was reduced to once daily instead of BID by GI. She is tolerating well.  Well controlled: yes  Managed by GI.     The following portions of the patient's history were reviewed and updated as appropriate: past medical history, past surgical history,  family history, social history, allergies, medications, and problem list.   Patient Active Problem List   Diagnosis Date Noted   Infrarenal abdominal aortic aneurysm (AAA) without rupture (HCC) 09/17/2022   Hypoglycemia 08/27/2022   Shoulder pain, left 08/31/2021   Pituitary adenoma (HCC) 03/31/2021   Pituitary mass (HCC) 02/14/2021   Mixed stress and urge urinary incontinence 11/29/2020   Sensorineural hearing loss (SNHL) of both ears 10/13/2020   Hyperlipidemia 08/02/2020   PVD (peripheral vascular disease) (HCC) 08/01/2020   Peripheral arterial disease (HCC) 07/26/2020   Basal cell carcinoma (BCC) of right lower leg 06/06/2020   Major depressive disorder with single episode, in full remission (HCC) 01/22/2020   Statin intolerance 01/26/2019   Insomnia due to other mental disorder 12/30/2018   Basal cell carcinoma (BCC) of left ala nasi 09/12/2018   Lumbar spondylosis 12/26/2017   Degeneration of lumbar intervertebral disc 07/09/2017   Lumbar post-laminectomy syndrome 07/09/2017   Herniated lumbar intervertebral disc 11/07/2016   Chronic right sacroiliac joint pain 06/08/2016   Chronic right-sided low back pain with right-sided sciatica 06/08/2016   Impingement syndrome of right shoulder 03/08/2016   Anxiety 08/29/2015   Osteoporosis 08/29/2015   CAD (coronary artery disease) 04/01/2014   Abnormal stress echocardiogram 01/29/2014   Dyspnea 01/04/2014   Essential hypertension    Melanoma (HCC)    Stricture of esophagus    Diverticulosis    Vitamin D deficiency    Hiatal hernia    Hemorrhoids  Arthritis    GERD (gastroesophageal reflux disease)    Osteopenia    Back pain 02/05/2011   Family history of malignant neoplasm of gastrointestinal tract 08/21/2010   Dysphagia 08/21/2010   ESOPHAGEAL REFLUX 01/11/2009   Past Medical History:  Diagnosis Date   Anxiety    Arthritis    Back pain    CAD (coronary artery disease)    a. 01/2014: cath showing 70% stenosis of  non-dominant RCA --> medically managed.    Cataract    bilat removed   Chest pain 07/22/2014   DDD (degenerative disc disease), cervical    DDD (degenerative disc disease), lumbar    Depression    Diverticulosis    GERD (gastroesophageal reflux disease)    Headache    Hemorrhoids    Hiatal hernia    Hyperlipidemia    Hypertension    Melanoma (HCC)    Orbital fracture (HCC)    right   Osteopenia 07/2015   T score -2.0 FRAX 10%/1.4%   Osteoporosis 08/29/2015   Pituitary tumor    Stricture of esophagus    Vitamin D deficiency    Past Surgical History:  Procedure Laterality Date   ABDOMINAL AORTOGRAM W/LOWER EXTREMITY N/A 08/01/2020   Procedure: ABDOMINAL AORTOGRAM W/LOWER EXTREMITY;  Surgeon: Runell Gess, MD;  Location: [facility];  Service: Cardiovascular;  Laterality: N/A;   CERVICAL DISC SURGERY     x 2   COLONOSCOPY     EXCISION OF SKIN CANCER     Melanoma   FOOT SURGERY Right    HEMORRHOID BANDING     X3   INCONTINENCE SURGERY  12/2020   LEFT HEART CATHETERIZATION WITH CORONARY ANGIOGRAM N/A 02/03/2014   Procedure: LEFT HEART CATHETERIZATION WITH CORONARY ANGIOGRAM;  Surgeon: Micheline Chapman, MD;  Location: [facility] CATH LAB;  Service: Cardiovascular;  Laterality: N/A;   LUMBAR DISC SURGERY  2018   OOPHORECTOMY  2012   BSO   PELVIC LAPAROSCOPY  2012   Diag Lap-BSO-lysis of adhesions   PERIPHERAL VASCULAR INTERVENTION Left 08/01/2020   Procedure: PERIPHERAL VASCULAR INTERVENTION;  Surgeon: Runell Gess, MD;  Location: [facility];  Service: Cardiovascular;  Laterality: Left;   TUBAL LIGATION     VAGINAL HYSTERECTOMY  1993   Family History  Problem Relation Age of Onset   Heart disease Mother    Hypertension Mother    Stroke Mother    Colon polyps Father    Heart disease Father        CHF   Hypertension Father    Cancer Father        COLON   Colon cancer Father    Colon cancer Sister    Colon polyps Sister    Heart disease Sister     Diabetes Sister    Melanoma Sister    Heart disease Sister    Heart disease Sister    Colon polyps Brother    Diabetes Brother    Heart disease Brother    Cancer Brother        COLON   Melanoma Brother    Colon cancer Brother    Melanoma Brother    Skin cancer Brother    Skin cancer Brother    Prostate cancer Brother    Skin cancer Daughter    Hypertension Daughter    Skin cancer Daughter    Migraines Daughter    Skin cancer Son    Esophageal cancer Neg Hx    Rectal cancer  Neg Hx    Stomach cancer Neg Hx    Breast cancer Neg Hx     Current Outpatient Medications:    AMBULATORY NON FORMULARY MEDICATION, TAM herbal laxative Take 2 tablet by mouth every other day as needed, Disp: , Rfl:    aspirin EC 81 MG tablet, Take 81 mg by mouth daily. Swallow whole., Disp: , Rfl:    Cholecalciferol (VITAMIN D3) 50 MCG (2000 UT) TABS, Take 2 tablets by mouth daily., Disp: , Rfl:    diclofenac Sodium (VOLTAREN) 1 % GEL, Apply 2-4 g topically as needed., Disp: , Rfl:    diphenhydrAMINE HCl, Sleep, (SLEEP AID) 50 MG CAPS, Take 50 mg by mouth at bedtime., Disp: , Rfl:    fluticasone (FLONASE) 50 MCG/ACT nasal spray, Place 1 spray into both nostrils daily as needed for allergies., Disp: , Rfl:    hydrochlorothiazide (HYDRODIURIL) 25 MG tablet, Take 1 tablet (25 mg total) by mouth daily., Disp: 90 tablet, Rfl: 3   HYDROcodone-acetaminophen (NORCO) 10-325 MG tablet, Take 1 tablet by mouth every 6 (six) hours as needed (Back pain)., Disp: , Rfl:    LORazepam (ATIVAN) 0.5 MG tablet, Take 1-2 tablets by mouth once daily as needed for anxiety., Disp: 60 tablet, Rfl: 2   losartan (COZAAR) 100 MG tablet, Take 1 tablet (100 mg total) by mouth at bedtime., Disp: 90 tablet, Rfl: 3   methocarbamol (ROBAXIN) 500 MG tablet, Take 1 tablet (500 mg total) by mouth every 8 (eight) hours as needed for muscle spasms., Disp: 30 tablet, Rfl: 0   ondansetron (ZOFRAN) 4 MG tablet, Take 1 tablet (4 mg total) by mouth every  8 (eight) hours as needed for nausea or vomiting., Disp: 20 tablet, Rfl: 0   spironolactone (ALDACTONE) 25 MG tablet, Take 1 tablet (25 mg total) by mouth daily., Disp: 90 tablet, Rfl: 2   MAGNESIUM CITRATE PO, Take 1 tablet by mouth daily., Disp: , Rfl:    pantoprazole (PROTONIX) 40 MG tablet, Take 1 tablet (40 mg total) by mouth daily., Disp: , Rfl:  Allergies  Allergen Reactions   Ciprofloxacin Rash   Lisinopril Swelling   Pneumococcal Vaccine Swelling   Pravastatin Other (See Comments)    Muscle aches    Crestor [Rosuvastatin]     myalgias   Evolocumab Other (See Comments)    cramping   Lipitor [Atorvastatin]     myalgias   Lubiprostone     Vomiting    Norvasc [Amlodipine]     swelling   Oxycodone Other (See Comments)   Zetia [Ezetimibe]     "felt horrible"     ROS: A complete ROS was performed with pertinent positives/negatives noted in the HPI. The remainder of the ROS are negative.    Objective:   Today's Vitals   01/14/23 1307  BP: 134/72  Pulse: 74  Temp: 98 F (36.7 C)  TempSrc: Temporal  SpO2: 99%  Weight: 169 lb 6.4 oz (76.8 kg)  Height: 5\' 5"  (1.651 m)    GENERAL: Well-appearing, in NAD. Well nourished.  SKIN: Pink, warm and dry.  NECK: Trachea midline. Full ROM w/o pain or tenderness. No lymphadenopathy.  RESPIRATORY: Chest wall symmetrical. Respirations even and non-labored. Breath sounds clear to auscultation bilaterally.  CARDIAC: S1, S2 present, regular rate and rhythm. Peripheral pulses 2+ bilaterally.  EXTREMITIES: Without clubbing, cyanosis, or edema.  NEUROLOGIC: No motor or sensory deficits. Steady, even gait.  PSYCH/MENTAL STATUS: Alert, oriented x 3. Cooperative, appropriate mood and affect.   There  are no preventive care reminders to display for this patient.   Results for orders placed or performed in visit on 01/14/23  Comp Met (CMET)  Result Value Ref Range   Sodium 137 135 - 145 mEq/L   Potassium 3.7 3.5 - 5.1 mEq/L   Chloride  100 96 - 112 mEq/L   CO2 29 19 - 32 mEq/L   Glucose, Bld 80 70 - 99 mg/dL   BUN 10 6 - 23 mg/dL   Creatinine, Ser 4.09 0.40 - 1.20 mg/dL   Total Bilirubin 0.5 0.2 - 1.2 mg/dL   Alkaline Phosphatase 70 39 - 117 U/L   AST 15 0 - 37 U/L   ALT 10 0 - 35 U/L   Total Protein 6.3 6.0 - 8.3 g/dL   Albumin 4.1 3.5 - 5.2 g/dL   GFR 81.19 (L) >14.78 mL/min   Calcium 8.8 8.4 - 10.5 mg/dL    The 29-FAOZ ASCVD risk score (Arnett DK, et al., 2019) is: 21.2%     Assessment & Plan:  1. Essential hypertension - well controlled continue current medication regimen  - Comp Met (CMET)  2. Anxiety - stable, continue ativan PRN   3. Gastroesophageal reflux disease without esophagitis - pantoprazole (PROTONIX) 40 MG tablet; Take 1 tablet (40 mg total) by mouth daily. - stable   Orders Placed This Encounter  Procedures   Comp Met (CMET)   No images are attached to the encounter or orders placed in the encounter. Meds ordered this encounter  Medications   pantoprazole (PROTONIX) 40 MG tablet    Sig: Take 1 tablet (40 mg total) by mouth daily.    Order Specific Question:   Supervising Provider    Answer:   Garnette Gunner [[id number]]    Return in about 6 months (around 07/15/2023) for Chronic Condition follow up.   [name], FNP

## 2023-01-28 ENCOUNTER — Ambulatory Visit: Payer: Medicare Other | Admitting: Internal Medicine

## 2023-01-29 DIAGNOSIS — M961 Postlaminectomy syndrome, not elsewhere classified: Secondary | ICD-10-CM | POA: Diagnosis not present

## 2023-01-29 DIAGNOSIS — M5030 Other cervical disc degeneration, unspecified cervical region: Secondary | ICD-10-CM | POA: Diagnosis not present

## 2023-01-29 DIAGNOSIS — M5412 Radiculopathy, cervical region: Secondary | ICD-10-CM | POA: Diagnosis not present

## 2023-01-29 DIAGNOSIS — M51361 Other intervertebral disc degeneration, lumbar region with lower extremity pain only: Secondary | ICD-10-CM | POA: Diagnosis not present

## 2023-02-07 ENCOUNTER — Ambulatory Visit: Payer: Medicare Other | Admitting: Internal Medicine

## 2023-02-07 ENCOUNTER — Encounter: Payer: Self-pay | Admitting: Internal Medicine

## 2023-02-07 VITALS — BP 128/80 | HR 71 | Temp 98.2°F | Ht 65.0 in | Wt 167.4 lb

## 2023-02-07 DIAGNOSIS — R1010 Upper abdominal pain, unspecified: Secondary | ICD-10-CM | POA: Diagnosis not present

## 2023-02-07 LAB — CBC WITH DIFFERENTIAL/PLATELET
Basophils Absolute: 0 10*3/uL (ref 0.0–0.1)
Basophils Relative: 0.6 % (ref 0.0–3.0)
Eosinophils Absolute: 0.1 10*3/uL (ref 0.0–0.7)
Eosinophils Relative: 2.3 % (ref 0.0–5.0)
HCT: 40.5 % (ref 36.0–46.0)
Hemoglobin: 13.4 g/dL (ref 12.0–15.0)
Lymphocytes Relative: 33.8 % (ref 12.0–46.0)
Lymphs Abs: 1.6 10*3/uL (ref 0.7–4.0)
MCHC: 33.2 g/dL (ref 30.0–36.0)
MCV: 96.4 fL (ref 78.0–100.0)
Monocytes Absolute: 0.4 10*3/uL (ref 0.1–1.0)
Monocytes Relative: 8.2 % (ref 3.0–12.0)
Neutro Abs: 2.6 10*3/uL (ref 1.4–7.7)
Neutrophils Relative %: 55.1 % (ref 43.0–77.0)
Platelets: 281 10*3/uL (ref 150.0–400.0)
RBC: 4.2 Mil/uL (ref 3.87–5.11)
RDW: 13.1 % (ref 11.5–15.5)
WBC: 4.7 10*3/uL (ref 4.0–10.5)

## 2023-02-07 LAB — COMPREHENSIVE METABOLIC PANEL
ALT: 11 U/L (ref 0–35)
AST: 18 U/L (ref 0–37)
Albumin: 4.3 g/dL (ref 3.5–5.2)
Alkaline Phosphatase: 67 U/L (ref 39–117)
BUN: 12 mg/dL (ref 6–23)
CO2: 30 meq/L (ref 19–32)
Calcium: 9.1 mg/dL (ref 8.4–10.5)
Chloride: 96 meq/L (ref 96–112)
Creatinine, Ser: 0.96 mg/dL (ref 0.40–1.20)
GFR: 58.06 mL/min — ABNORMAL LOW (ref >60.00)
Glucose, Bld: 95 mg/dL (ref 70–99)
Potassium: 3.8 meq/L (ref 3.5–5.1)
Sodium: 135 meq/L (ref 135–145)
Total Bilirubin: 0.7 mg/dL (ref 0.2–1.2)
Total Protein: 6.8 g/dL (ref 6.0–8.3)

## 2023-02-07 LAB — LIPASE: Lipase: 31 U/L (ref 11.0–59.0)

## 2023-02-07 NOTE — Progress Notes (Signed)
[facility] [address] [place] [address] Dept: [phone] Dept Fax: [phone]  Acute Care Office Visit  Subjective:   Amanda [name] 1946/01/02 02/07/2023  Chief Complaint  Patient presents with   Nausea    Started 3 weeks     HPI: Discussed the use of AI scribe software for clinical note transcription with the patient, who gave verbal consent to proceed.  History of Present Illness   The patient presents with a several months long history of abdominal pain, described as a 'twisting' sensation that radiates up under the rib cage. The pain is intermittent, but has worsened over the past month. The pain is severe enough to 'take their breath away' and is associated with a sensation of tightness and 'smothering.' The pain is worse at night when lying down and is sometimes triggered by certain movements.  The patient also reports feeling nauseated, but has not vomited. They have been taking over-the-counter Alka-Seltzers for relief, but this has not been very effective. The patient denies fever, but reports feeling 'burning up' at night, although temperature checks have been normal.  The patient has a history of constipation and takes medication for this regularly. They had a bowel movement this morning after taking a laxative due to not having a bowel movement for two to three days. The patient denies any blood in their stool or any urinary symptoms, but reports a recent onset of urinary leakage following a sling procedure two years ago.  The patient has a history of gastroesophageal reflux disease (GERD) and has been taking Protonix once daily for this. They have seen a gastroenterologist for their abdominal pain and have had an endoscopy and colonoscopy in June 2024, which revealed a hiatal hernia and diverticulosis without diverticulitis.   Also had CT abdomen/pelvis in June 2024 which showed the  following: FINDINGS: Lower chest: There are subpleural atelectatic changes in the visualized lung bases. No overt consolidation. No pleural effusion. The heart is normal in size. No pericardial effusion.   Hepatobiliary: The liver is normal in size. Non-cirrhotic configuration. No suspicious mass. Note is made of a subcapsular 7 x 11 mm simple cyst in the right hepatic dome. No intrahepatic or extrahepatic bile duct dilation. No calcified gallstones. Normal gallbladder wall thickness. No pericholecystic inflammatory changes.   Pancreas: Unremarkable. No pancreatic ductal dilatation or surrounding inflammatory changes.   Spleen: Within normal limits. No focal lesion.   Adrenals/Urinary Tract: Adrenal glands are unremarkable. No suspicious renal mass. No hydronephrosis. No renal or ureteric calculi. Unremarkable urinary bladder.   Stomach/Bowel: There is a small hiatal hernia. No disproportionate dilation of the small or large bowel loops. No evidence of abnormal bowel wall thickening or inflammatory changes. The appendix is unremarkable. There are multiple diverticula mainly in the sigmoid colon, without imaging signs of diverticulitis.   Vascular/Lymphatic: No ascites or pneumoperitoneum. No abdominal or pelvic lymphadenopathy, by size criteria. There is ectasia of infrarenal aorta measuring up to 2.7 x 2.7 cm orthogonally on axial plane. There is mild peripheral intraluminal thrombus/hematoma. No aneurysmal dilation of the other major abdominal arteries. There are moderate peripheral atherosclerotic vascular calcifications of the aorta and its major branches.   Reproductive: The uterus is surgically absent. No large adnexal mass.   Other: There is a tiny fat containing umbilical hernia. The soft tissues and abdominal wall are otherwise unremarkable.   Musculoskeletal: No suspicious osseous lesions. There are moderate - marked multilevel degenerative changes in the  visualized  spine.   IMPRESSION: 1. No acute inflammatory process identified in the abdomen or pelvis. 2. Multiple other nonacute observations, as described above. 3. Infrarenal abdominal aortic ectasia measuring up to 2.7 cm. Recommend follow-up ultrasound every 5 years.   Aortic Atherosclerosis (ICD10-I70.0).     Electronically Signed   By: Jules Schick M.D.   On: 09/14/2022 12:08    The following portions of the patient's history were reviewed and updated as appropriate: past medical history, past surgical history, family history, social history, allergies, medications, and problem list.   Patient Active Problem List   Diagnosis Date Noted   Infrarenal abdominal aortic aneurysm (AAA) without rupture (HCC) 09/17/2022   Hypoglycemia 08/27/2022   Shoulder pain, left 08/31/2021   Pituitary adenoma (HCC) 03/31/2021   Pituitary mass (HCC) 02/14/2021   Mixed stress and urge urinary incontinence 11/29/2020   Sensorineural hearing loss (SNHL) of both ears 10/13/2020   Hyperlipidemia 08/02/2020   PVD (peripheral vascular disease) (HCC) 08/01/2020   Peripheral arterial disease (HCC) 07/26/2020   Basal cell carcinoma (BCC) of right lower leg 06/06/2020   Major depressive disorder with single episode, in full remission (HCC) 01/22/2020   Statin intolerance 01/26/2019   Insomnia due to other mental disorder 12/30/2018   Basal cell carcinoma (BCC) of left ala nasi 09/12/2018   Lumbar spondylosis 12/26/2017   Degeneration of lumbar intervertebral disc 07/09/2017   Lumbar post-laminectomy syndrome 07/09/2017   Herniated lumbar intervertebral disc 11/07/2016   Chronic right sacroiliac joint pain 06/08/2016   Chronic right-sided low back pain with right-sided sciatica 06/08/2016   Impingement syndrome of right shoulder 03/08/2016   Anxiety 08/29/2015   Osteoporosis 08/29/2015   CAD (coronary artery disease) 04/01/2014   Abnormal stress echocardiogram 01/29/2014   Dyspnea 01/04/2014    Essential hypertension    Melanoma (HCC)    Stricture of esophagus    Diverticulosis    Vitamin D deficiency    Hiatal hernia    Hemorrhoids    Arthritis    GERD (gastroesophageal reflux disease)    Osteopenia    Back pain 02/05/2011   Family history of malignant neoplasm of gastrointestinal tract 08/21/2010   Dysphagia 08/21/2010   ESOPHAGEAL REFLUX 01/11/2009   Past Medical History:  Diagnosis Date   Anxiety    Arthritis    Back pain    CAD (coronary artery disease)    a. 01/2014: cath showing 70% stenosis of non-dominant RCA --> medically managed.    Cataract    bilat removed   Chest pain 07/22/2014   DDD (degenerative disc disease), cervical    DDD (degenerative disc disease), lumbar    Depression    Diverticulosis    GERD (gastroesophageal reflux disease)    Headache    Hemorrhoids    Hiatal hernia    Hyperlipidemia    Hypertension    Melanoma (HCC)    Orbital fracture (HCC)    right   Osteopenia 07/2015   T score -2.0 FRAX 10%/1.4%   Osteoporosis 08/29/2015   Pituitary tumor    Stricture of esophagus    Vitamin D deficiency    Past Surgical History:  Procedure Laterality Date   ABDOMINAL AORTOGRAM W/LOWER EXTREMITY N/A 08/01/2020   Procedure: ABDOMINAL AORTOGRAM W/LOWER EXTREMITY;  Surgeon: Runell Gess, MD;  Location: [facility];  Service: Cardiovascular;  Laterality: N/A;   CERVICAL DISC SURGERY     x 2   COLONOSCOPY     EXCISION OF SKIN CANCER  Melanoma   FOOT SURGERY Right    HEMORRHOID BANDING     X3   INCONTINENCE SURGERY  12/2020   LEFT HEART CATHETERIZATION WITH CORONARY ANGIOGRAM N/A 02/03/2014   Procedure: LEFT HEART CATHETERIZATION WITH CORONARY ANGIOGRAM;  Surgeon: Micheline Chapman, MD;  Location: [facility] CATH LAB;  Service: Cardiovascular;  Laterality: N/A;   LUMBAR DISC SURGERY  2018   OOPHORECTOMY  2012   BSO   PELVIC LAPAROSCOPY  2012   Diag Lap-BSO-lysis of adhesions   PERIPHERAL VASCULAR INTERVENTION Left 08/01/2020    Procedure: PERIPHERAL VASCULAR INTERVENTION;  Surgeon: Runell Gess, MD;  Location: [facility];  Service: Cardiovascular;  Laterality: Left;   TUBAL LIGATION     VAGINAL HYSTERECTOMY  1993   Family History  Problem Relation Age of Onset   Heart disease Mother    Hypertension Mother    Stroke Mother    Colon polyps Father    Heart disease Father        CHF   Hypertension Father    Cancer Father        COLON   Colon cancer Father    Colon cancer Sister    Colon polyps Sister    Heart disease Sister    Diabetes Sister    Melanoma Sister    Heart disease Sister    Heart disease Sister    Colon polyps Brother    Diabetes Brother    Heart disease Brother    Cancer Brother        COLON   Melanoma Brother    Colon cancer Brother    Melanoma Brother    Skin cancer Brother    Skin cancer Brother    Prostate cancer Brother    Skin cancer Daughter    Hypertension Daughter    Skin cancer Daughter    Migraines Daughter    Skin cancer Son    Esophageal cancer Neg Hx    Rectal cancer Neg Hx    Stomach cancer Neg Hx    Breast cancer Neg Hx     Current Outpatient Medications:    AMBULATORY NON FORMULARY MEDICATION, TAM herbal laxative Take 2 tablet by mouth every other day as needed, Disp: , Rfl:    aspirin EC 81 MG tablet, Take 81 mg by mouth daily. Swallow whole., Disp: , Rfl:    Cholecalciferol (VITAMIN D3) 50 MCG (2000 UT) TABS, Take 2 tablets by mouth daily., Disp: , Rfl:    diclofenac Sodium (VOLTAREN) 1 % GEL, Apply 2-4 g topically as needed., Disp: , Rfl:    diphenhydrAMINE HCl, Sleep, (SLEEP AID) 50 MG CAPS, Take 50 mg by mouth at bedtime., Disp: , Rfl:    fluticasone (FLONASE) 50 MCG/ACT nasal spray, Place 1 spray into both nostrils daily as needed for allergies., Disp: , Rfl:    hydrochlorothiazide (HYDRODIURIL) 25 MG tablet, Take 1 tablet (25 mg total) by mouth daily., Disp: 90 tablet, Rfl: 3   HYDROcodone-acetaminophen (NORCO) 10-325 MG tablet, Take 1  tablet by mouth every 6 (six) hours as needed (Back pain)., Disp: , Rfl:    LORazepam (ATIVAN) 0.5 MG tablet, Take 1-2 tablets by mouth once daily as needed for anxiety., Disp: 60 tablet, Rfl: 2   losartan (COZAAR) 100 MG tablet, Take 1 tablet (100 mg total) by mouth at bedtime., Disp: 90 tablet, Rfl: 3   MAGNESIUM CITRATE PO, Take 1 tablet by mouth daily., Disp: , Rfl:    methocarbamol (ROBAXIN) 500 MG tablet,  Take 1 tablet (500 mg total) by mouth every 8 (eight) hours as needed for muscle spasms., Disp: 30 tablet, Rfl: 0   ondansetron (ZOFRAN) 4 MG tablet, Take 1 tablet (4 mg total) by mouth every 8 (eight) hours as needed for nausea or vomiting., Disp: 20 tablet, Rfl: 0   pantoprazole (PROTONIX) 40 MG tablet, Take 1 tablet (40 mg total) by mouth daily., Disp: , Rfl:    spironolactone (ALDACTONE) 25 MG tablet, Take 1 tablet (25 mg total) by mouth daily., Disp: 90 tablet, Rfl: 2 Allergies  Allergen Reactions   Ciprofloxacin Rash   Lisinopril Swelling   Pneumococcal Vaccine Swelling   Pravastatin Other (See Comments)    Muscle aches    Crestor [Rosuvastatin]     myalgias   Evolocumab Other (See Comments)    cramping   Lipitor [Atorvastatin]     myalgias   Lubiprostone     Vomiting    Norvasc [Amlodipine]     swelling   Oxycodone Other (See Comments)   Zetia [Ezetimibe]     "felt horrible"     ROS: A complete ROS was performed with pertinent positives/negatives noted in the HPI. The remainder of the ROS are negative.    Objective:   Today's Vitals   02/07/23 1305  BP: 128/80  Pulse: 71  Temp: 98.2 F (36.8 C)  TempSrc: Temporal  SpO2: 96%  Weight: 167 lb 6.4 oz (75.9 kg)  Height: 5\' 5"  (1.651 m)    GENERAL: Well-appearing, in NAD. Well nourished.  SKIN: Pink, warm and dry. No rash. NECK: Trachea midline. Full ROM w/o pain or tenderness. No lymphadenopathy.  RESPIRATORY: Chest wall symmetrical. Respirations even and non-labored. Breath sounds clear to auscultation  bilaterally.  CARDIAC: S1, S2 present, regular rate and rhythm. Peripheral pulses 2+ bilaterally.  GI: Abdomen soft, mild tenderness to RUQ and epigastric region. Normoactive bowel sounds. No rebound tenderness. No hepatomegaly or splenomegaly. No CVA tenderness.  EXTREMITIES: Without clubbing, cyanosis, or edema.  NEUROLOGIC: Steady, even gait.  PSYCH/MENTAL STATUS: Alert, oriented x 3. Cooperative, appropriate mood and affect.    No results found for any visits on 02/07/23.    Assessment & Plan:  Assessment and Plan    Abdominal Pain Chronic, intermittent abdominal pain, described as a twisting sensation under the rib cage, worsening over the past month. No fever, vomiting, or blood in stool. Some constipation managed with laxatives. Recent endoscopy and colonoscopy. -Order CBC. CMP. lipase, and abdominal ultrasound  -If ultrasound is negative, consider CT scan. -If still no clear cause, recommend re-evaluation by gastroenterologist.      Orders Placed This Encounter  Procedures   US Abdomen Complete    Standing Status:   Future    Standing Expiration Date:   02/07/2024    Order Specific Question:   Reason for Exam (SYMPTOM  OR DIAGNOSIS REQUIRED)    Answer:   acute on chronic abdominal pain, specifically in epigastic and RUQ. hx of hiatal hernia    Order Specific Question:   Preferred imaging location?    Answer:   [facility]   CBC w/Diff   Comp Met (CMET)   Lipase   Lab Orders         CBC w/Diff         Comp Met (CMET)         Lipase     No images are attached to the encounter or orders placed in the encounter.  Return if symptoms worsen or fail  to improve.   [name], FNP

## 2023-02-08 ENCOUNTER — Ambulatory Visit
Admission: RE | Admit: 2023-02-08 | Discharge: 2023-02-08 | Disposition: A | Discharge status: Home / Self Care | Payer: Medicare Other | Source: Ambulatory Visit | Attending: Internal Medicine | Admitting: Internal Medicine

## 2023-02-08 DIAGNOSIS — R1010 Upper abdominal pain, unspecified: Secondary | ICD-10-CM

## 2023-02-08 DIAGNOSIS — R109 Unspecified abdominal pain: Secondary | ICD-10-CM | POA: Diagnosis not present

## 2023-02-11 ENCOUNTER — Other Ambulatory Visit: Payer: Self-pay | Admitting: Internal Medicine

## 2023-02-11 ENCOUNTER — Telehealth: Payer: Self-pay

## 2023-02-11 ENCOUNTER — Telehealth: Payer: Self-pay | Admitting: Internal Medicine

## 2023-02-11 DIAGNOSIS — R1010 Upper abdominal pain, unspecified: Secondary | ICD-10-CM

## 2023-02-11 NOTE — Telephone Encounter (Addendum)
Returned call to inform patient of [place] results

## 2023-02-11 NOTE — Telephone Encounter (Signed)
Order changed.

## 2023-02-11 NOTE — Telephone Encounter (Signed)
Tansy from [facility] called asking if CT order can be changed to either CT with contrast or CT without contrast.

## 2023-02-11 NOTE — Telephone Encounter (Signed)
Pt is wanting a cb concerning her most recent lab results. Please advise pt at [phone]

## 2023-02-12 DIAGNOSIS — M5416 Radiculopathy, lumbar region: Secondary | ICD-10-CM | POA: Diagnosis not present

## 2023-02-13 ENCOUNTER — Ambulatory Visit: Payer: Medicare Other

## 2023-02-13 VITALS — BP 151/83 | HR 59 | Ht 65.0 in | Wt 170.0 lb

## 2023-02-13 DIAGNOSIS — I2510 Atherosclerotic heart disease of native coronary artery without angina pectoris: Secondary | ICD-10-CM | POA: Diagnosis not present

## 2023-02-13 DIAGNOSIS — I10 Essential (primary) hypertension: Secondary | ICD-10-CM | POA: Diagnosis not present

## 2023-02-13 DIAGNOSIS — I351 Nonrheumatic aortic (valve) insufficiency: Secondary | ICD-10-CM

## 2023-02-13 DIAGNOSIS — I77810 Thoracic aortic ectasia: Secondary | ICD-10-CM

## 2023-02-13 DIAGNOSIS — E782 Mixed hyperlipidemia: Secondary | ICD-10-CM

## 2023-02-13 DIAGNOSIS — R0789 Other chest pain: Secondary | ICD-10-CM

## 2023-02-13 HISTORY — DX: Thoracic aortic ectasia: I77.810

## 2023-02-13 HISTORY — DX: Nonrheumatic aortic (valve) insufficiency: I35.1

## 2023-02-13 MED ORDER — ROSUVASTATIN CALCIUM 5 MG PO TABS: 2.5000 mg | ORAL_TABLET | ORAL | 3 refills | Status: DC

## 2023-02-13 NOTE — Assessment & Plan Note (Signed)
Suboptimal blood pressure control today. She attributes this to back pain and recent injection.  Mentions blood pressure readings at home well-controlled typically below 130 mmHg. Continue hydrochlorothiazide 25 mg once daily and losartan 100 mg once daily. Suggested she take these medications at bedtime to avoid symptoms of lightheadedness during the day.  She uses spironolactone as needed for blood pressures consistently above systolic 145 mmHg.  Advised her to notify us if her blood pressure readings at home are consistently above 130 mmHg

## 2023-02-13 NOTE — Assessment & Plan Note (Signed)
Stable findings on imaging over the last few years. She is expected to get an repeat echocardiogram in 1 year to follow-up on her aortic valve disease. Will review ascending aorta size.

## 2023-02-13 NOTE — Assessment & Plan Note (Signed)
Currently mild aortic insufficiency. No aortic stenosis. Valve anatomy reported as bicuspid. Follow-up with annual echocardiogram transthoracic.

## 2023-02-13 NOTE — Assessment & Plan Note (Addendum)
Remains asymptomatic. Continue with aspirin 81 mg once daily. Reviewed statins for lipid-lowering therapy and also for lowering the of MI and stroke.  She is willing to try low-dose statins now that her muscle cramps have significantly improved since being on supplementation with magnesium.  Will prescribe Crestor 2.5 mg every other day. She is aware it is okay to discontinue if she is having any symptoms on the medication.

## 2023-02-13 NOTE — Progress Notes (Signed)
Cardiology Consultation:    Date:  02/13/2023   ID:  Amanda [name], DOB 05-08-1945, MRN [id number]  PCP:  [name], FNP  Cardiologist:  Marlyn Corporal Falicity Sheets, MD   Referring MD: [name], FNP   Chief Complaint  Patient presents with   Follow-up     ASSESSMENT AND PLAN:   Ms. Wempe 77 year old with history of nonobstructive coronary artery disease, last CT coronary angiogram January 2020, hypertension, hyperlipidemia, statin intolerance with myalgias, mild aortic insufficiency and mildly dilated ascending aorta on prior echocardiograms, recent echocardiogram also reported possibility of bicuspid aortic valve, small hiatal hernia on prior CT coronary imaging from January 2020, osteoporosis, depression, anxiety, intolerance to beta-blockers due to sinus bradycardia associated with shortness of breath [resolved with discontinuing atenolol in the past].  Here for follow-up visit since Dr. Shari Prows has left practice with:.  Problem List Items Addressed This Visit     Essential hypertension - Primary    Suboptimal blood pressure control today. She attributes this to back pain and recent injection.  Mentions blood pressure readings at home well-controlled typically below 130 mmHg. Continue hydrochlorothiazide 25 mg once daily and losartan 100 mg once daily. Suggested she take these medications at bedtime to avoid symptoms of lightheadedness during the day.  She uses spironolactone as needed for blood pressures consistently above systolic 145 mmHg.  Advised her to notify us if her blood pressure readings at home are consistently above 130 mmHg      Relevant Medications   spironolactone (ALDACTONE) 25 MG tablet   rosuvastatin (CRESTOR) 5 MG tablet   Other Relevant Orders   EKG 12-Lead (Completed)   ECHOCARDIOGRAM COMPLETE   CAD, cath February 05 1569% small nondominant RCA lesion medically treated; CT coronary angio January 2020 calcium score 53, mild proximal LAD  disease    Remains asymptomatic. Continue with aspirin 81 mg once daily. Reviewed statins for lipid-lowering therapy and also for lowering the of MI and stroke.  She is willing to try low-dose statins now that her muscle cramps have significantly improved since being on supplementation with magnesium.  Will prescribe Crestor 2.5 mg every other day. She is aware it is okay to discontinue if she is having any symptoms on the medication.        Relevant Medications   spironolactone (ALDACTONE) 25 MG tablet   rosuvastatin (CRESTOR) 5 MG tablet   Other Relevant Orders   ECHOCARDIOGRAM COMPLETE   Hyperlipidemia    Near optimal blood cholesterol readings however unable to tolerate statins in the past. Notably she also had myalgias and cramps even without statins. Mentions cramps have since resolved being on magnesium supplements.  PCSK9 inhibitors also cause similar symptoms.  Tentatively casted and inclisiran were not cost affordable.  Will retry low-dose of Crestor 2.5 mg every other day. If unable to tolerate, continue with dietary modifications.      Relevant Medications   spironolactone (ALDACTONE) 25 MG tablet   rosuvastatin (CRESTOR) 5 MG tablet   Mild aortic insufficiency by transthoracic echocardiogram last evaluated May 2024.  Valve anatomy reported as bicuspid however noted as normal on prior studies    Currently mild aortic insufficiency. No aortic stenosis. Valve anatomy reported as bicuspid. Follow-up with annual echocardiogram transthoracic.        Relevant Medications   spironolactone (ALDACTONE) 25 MG tablet   rosuvastatin (CRESTOR) 5 MG tablet   Mild ascending aorta dilatation (HCC), 4.2 cm by transthoracic echocardiogram May 2024.  Was similar in size on prior  CT coronary imaging from January 2020    Stable findings on imaging over the last few years. She is expected to get an repeat echocardiogram in 1 year to follow-up on her aortic valve disease. Will  review ascending aorta size.       Relevant Medications   spironolactone (ALDACTONE) 25 MG tablet   rosuvastatin (CRESTOR) 5 MG tablet   Chest discomfort, atypical appears noncardiac and possibly related to hiatal hernia    Describes chest discomfort on laying supine at night. Does not appear cardiac in description. Likely related to underlying history of small hiatal hernia or gastroesophageal reflux disease. She is currently on Protonix 40 mg once daily. Advised to avoid food over liquids closer to bedtime.  If symptoms persist, follow-up with PCP for further evaluation of gastrointestinal causes.      Return to clinic for follow-up tentatively in 1 year or as needed.   History of Present Illness:    Amanda [name] is a 77 y.o. female who is being seen today for follow-up visit. Last cardiology visit was July 04, 2022 with Laurance Flatten MD. PCP is using the second assist for [name], FNP.   History of coronary artery disease [abnormal stress echo in October 2015, cardiac cath November 2015 with 70% small nondominant RCA lesion treated medically, abnormal nuclear stress test December 2019 with inferobasilar ischemia; CT coronary angiogram January 2020 with calcium score 53 and mild proximal LAD disease]. Also has history of hypertension, hyperlipidemia, statin intolerance patient did not tolerate PCSK9 inhibitors, mild aortic insufficiency by prior echocardiogram, mildly dilated ascending aorta 4.2 cm by prior echocardiogram [was similarly about 4.2 cm in size on prior CT chest January 2020], hiatal hernia, osteoporosis, depression, anxiety, intolerance to beta-blockers due to sinus bradycardia and associated shortness of breath  Here for the visit by herself. Mentions she does not have any significant complaints other than mild discomfort in the chest when she lays down to sleep at night.  Denies any chest pain, shortness of breath with activities.  Has chronic back pain  for which she got an injection done yesterday and feels her elevated blood pressures today at the office are a result of this. Denies any palpitations, lightheadedness, dizziness or syncopal episodes. Does do her day-to-day activities at home and also sometimes working in her lawn without significant limitations work.  Mentions blood pressures at home typically well-controlled ranging from systolic 120s to 161W. Mentions that she uses spironolactone on an as-needed basis if the blood pressures are not about 145 mmHg. Denies any falls.  Denies any blood in urine or stools. Good compliance with her medications.  At times feels tired during midday and on blood pressure check systolic blood pressures down into 110s.  Reports intolerance with statins was mostly muscle aches.  Had similar issues with Repatha and subsequently cost issues with alternative medications.  Zetia also had similar effects. She mentions her muscle aches have continued even after discontinuing the medications and recently these have improved after magnesium supplementation. She is willing to try statins again.  EKG in the clinic showed as show sinus rhythm heart rate 59/min, PR interval 200 ms, inferior Q waves.  Last echocardiogram to review is from Jul 19, 2022 noted LVEF 60 to 65%, grade 1 diastolic dysfunction, global optimal strain -14.4%, normal RV size and function, aortic valve was reported bicuspid with mild insufficiency.  Ascending aorta once again noted to be dilated measuring 4.2 cm.  Past Medical History:  Diagnosis Date  Anxiety    Arthritis    Back pain    CAD (coronary artery disease)    a. 01/2014: cath showing 70% stenosis of non-dominant RCA --> medically managed.    Cataract    bilat removed   Chest pain 07/22/2014   DDD (degenerative disc disease), cervical    DDD (degenerative disc disease), lumbar    Depression    Diverticulosis    GERD (gastroesophageal reflux disease)    Headache     Hemorrhoids    Hiatal hernia    Hyperlipidemia    Hypertension    Melanoma (HCC)    Orbital fracture (HCC)    right   Osteopenia 07/2015   T score -2.0 FRAX 10%/1.4%   Osteoporosis 08/29/2015   Pituitary tumor    Stricture of esophagus    Vitamin D deficiency     Past Surgical History:  Procedure Laterality Date   ABDOMINAL AORTOGRAM W/LOWER EXTREMITY N/A 08/01/2020   Procedure: ABDOMINAL AORTOGRAM W/LOWER EXTREMITY;  Surgeon: Runell Gess, MD;  Location: [facility];  Service: Cardiovascular;  Laterality: N/A;   CERVICAL DISC SURGERY     x 2   COLONOSCOPY     EXCISION OF SKIN CANCER     Melanoma   FOOT SURGERY Right    HEMORRHOID BANDING     X3   INCONTINENCE SURGERY  12/2020   LEFT HEART CATHETERIZATION WITH CORONARY ANGIOGRAM N/A 02/03/2014   Procedure: LEFT HEART CATHETERIZATION WITH CORONARY ANGIOGRAM;  Surgeon: Micheline Chapman, MD;  Location: [facility] CATH LAB;  Service: Cardiovascular;  Laterality: N/A;   LUMBAR DISC SURGERY  2018   OOPHORECTOMY  2012   BSO   PELVIC LAPAROSCOPY  2012   Diag Lap-BSO-lysis of adhesions   PERIPHERAL VASCULAR INTERVENTION Left 08/01/2020   Procedure: PERIPHERAL VASCULAR INTERVENTION;  Surgeon: Runell Gess, MD;  Location: [facility];  Service: Cardiovascular;  Laterality: Left;   TUBAL LIGATION     VAGINAL HYSTERECTOMY  1993    Current Medications: Current Meds  Medication Sig   AMBULATORY NON FORMULARY MEDICATION Take 2 tablets by mouth every other day as needed (constipation). TAM herbal laxative Take 2 tablet by mouth every other day as needed   aspirin EC 81 MG tablet Take 81 mg by mouth daily. Swallow whole.   Cholecalciferol (VITAMIN D3) 50 MCG (2000 UT) TABS Take 2 tablets by mouth daily.   diclofenac Sodium (VOLTAREN) 1 % GEL Apply 2-4 g topically as needed (Joint pain).   fluticasone (FLONASE) 50 MCG/ACT nasal spray Place 1 spray into both nostrils daily as needed for allergies.   hydrochlorothiazide  (HYDRODIURIL) 25 MG tablet Take 1 tablet (25 mg total) by mouth daily.   HYDROcodone-acetaminophen (NORCO) 10-325 MG tablet Take 1 tablet by mouth every 6 (six) hours as needed (Back pain).   LORazepam (ATIVAN) 0.5 MG tablet Take 1-2 tablets by mouth once daily as needed for anxiety.   losartan (COZAAR) 100 MG tablet Take 1 tablet (100 mg total) by mouth at bedtime.   MAGNESIUM CITRATE PO Take 1 tablet by mouth daily.   methocarbamol (ROBAXIN) 500 MG tablet Take 1 tablet (500 mg total) by mouth every 8 (eight) hours as needed for muscle spasms.   pantoprazole (PROTONIX) 40 MG tablet Take 1 tablet (40 mg total) by mouth daily.   rosuvastatin (CRESTOR) 5 MG tablet Take 0.5 tablets (2.5 mg total) by mouth every other day.   spironolactone (ALDACTONE) 25 MG tablet Take 25 mg by  mouth daily as needed (elevated BP).     Allergies:   Ciprofloxacin, Lisinopril, Pneumococcal vaccine, Pravastatin, Crestor [rosuvastatin], Evolocumab, Lipitor [atorvastatin], Lubiprostone, Norvasc [amlodipine], Oxycodone, and Zetia [ezetimibe]   Social History   Socioeconomic History   Marital status: Widowed    Spouse name: Not on file   Number of children: 3   Years of education: Not on file   Highest education level: 10th grade  Occupational History   Occupation: Disabled/retired  Tobacco Use   Smoking status: Former    Current packs/day: 0.00    Types: Cigarettes    Start date: 03/19/1974    Quit date: 03/19/1994    Years since quitting: 28.9   Smokeless tobacco: Never  Vaping Use   Vaping status: Never Used  Substance and Sexual Activity   Alcohol use: Not Currently    Comment: not much when younger, nothing for years   Drug use: Yes    Types: Hydrocodone   Sexual activity: Not Currently    Birth control/protection: Surgical    Comment: 1st intercourse 77 yo-Fewer than 5 partners,des neg  Other Topics Concern   Not on file  Social History Narrative   Lives with daughter    Some caffeine    Social  Determinants of Health   Financial Resource Strain: Low Risk  (10/26/2022)   Overall Financial Resource Strain (CARDIA)    Difficulty of Paying Living Expenses: Not hard at all  Food Insecurity: No Food Insecurity (10/26/2022)   Hunger Vital Sign    Worried About Running Out of Food in the Last Year: Never true    Ran Out of Food in the Last Year: Never true  Transportation Needs: No Transportation Needs (10/26/2022)   PRAPARE - Administrator, Civil Service (Medical): No    Lack of Transportation (Non-Medical): No  Physical Activity: Sufficiently Active (10/26/2022)   Exercise Vital Sign    Days of Exercise per Week: 6 days    Minutes of Exercise per Session: 30 min  Stress: No Stress Concern Present (10/26/2022)   [employer] of Occupational Health - Occupational Stress Questionnaire    Feeling of Stress : Only a little  Social Connections: Moderately Isolated (10/26/2022)   Social Connection and Isolation Panel [NHANES]    Frequency of Communication with Friends and Family: More than three times a week    Frequency of Social Gatherings with Friends and Family: More than three times a week    Attends Religious Services: More than 4 times per year    Active Member of [employer] or Organizations: No    Attends Banker Meetings: Never    Marital Status: Widowed     Family History: The patient's family history includes Cancer in her brother and father; Colon cancer in her brother, father, and sister; Colon polyps in her brother, father, and sister; Diabetes in her brother and sister; Heart disease in her brother, father, mother, sister, sister, and sister; Hypertension in her daughter, father, and mother; Melanoma in her brother, brother, and sister; Migraines in her daughter; Prostate cancer in her brother; Skin cancer in her brother, brother, daughter, daughter, and son; Stroke in her mother. There is no history of Esophageal cancer, Rectal cancer, Stomach cancer, or  Breast cancer. ROS:   Please see the history of present illness.    All 14 point review of systems negative except as described per history of present illness.  EKGs/Labs/Other Studies Reviewed:    The following studies were reviewed today:  EKG:  EKG Interpretation Date/Time:  Wednesday February 13 2023 13:08:15 EST Ventricular Rate:  59 PR Interval:  200 QRS Duration:  74 QT Interval:  436 QTC Calculation: 431 R Axis:   -5  Text Interpretation: Sinus bradycardia Inferior infarct (cited on or before 13-Feb-2023) When compared with ECG of 07-Dec-2021 09:43, Premature ventricular complexes are no longer Present Criteria for Anterior infarct are no longer Present Confirmed by Huntley Dec reddy [phone]) on 02/13/2023 1:12:11 PM    Recent Labs: 07/11/2022: Magnesium 1.8 02/07/2023: ALT 11; BUN 12; Creatinine, Ser 0.96; Hemoglobin 13.4; Platelets 281.0; Potassium 3.8; Sodium 135  Recent Lipid Panel    Component Value Date/Time   CHOL 145 03/29/2021 1123   TRIG 212 (H) 03/29/2021 1123   HDL 39 (L) 03/29/2021 1123   CHOLHDL 3.7 03/29/2021 1123   LDLCALC 71 03/29/2021 1123    Physical Exam:    VS:  BP (!) 151/83 (BP Location: Left Arm, Patient Position: Sitting, Cuff Size: Normal)   Pulse (!) 59   Ht 5\' 5"  (1.651 m)   Wt 170 lb (77.1 kg)   SpO2 96%   BMI 28.29 kg/m     Wt Readings from Last 3 Encounters:  02/13/23 170 lb (77.1 kg)  02/07/23 167 lb 6.4 oz (75.9 kg)  01/14/23 169 lb 6.4 oz (76.8 kg)     GENERAL:  Well nourished, well developed in no acute distress NECK: No JVD; No carotid bruits CARDIAC: RRR, S1 and S2 present, no murmurs, no rubs, no gallops CHEST:  Clear to auscultation without rales, wheezing or rhonchi  Extremities: No pitting pedal edema. Pulses bilaterally symmetric with radial 2+ and dorsalis pedis 2+ NEUROLOGIC:  Alert and oriented x 3  Medication Adjustments/Labs and Tests Ordered: Current medicines are reviewed at length with the  patient today.  Concerns regarding medicines are outlined above.  Orders Placed This Encounter  Procedures   EKG 12-Lead   ECHOCARDIOGRAM COMPLETE   Meds ordered this encounter  Medications   rosuvastatin (CRESTOR) 5 MG tablet    Sig: Take 0.5 tablets (2.5 mg total) by mouth every other day.    Dispense:  45 tablet    Refill:  3    Signed, Cystal Shannahan reddy Gianni Fuchs, MD, MPH, [facility]. 02/13/2023 1:43 PM    [facility]

## 2023-02-13 NOTE — Assessment & Plan Note (Signed)
Describes chest discomfort on laying supine at night. Does not appear cardiac in description. Likely related to underlying history of small hiatal hernia or gastroesophageal reflux disease. She is currently on Protonix 40 mg once daily. Advised to avoid food over liquids closer to bedtime.  If symptoms persist, follow-up with PCP for further evaluation of gastrointestinal causes.

## 2023-02-13 NOTE — Patient Instructions (Signed)
Medication Instructions:  Your physician has recommended you make the following change in your medication:   START: Crestor 2.5 mg every other day  *If you need a refill on your cardiac medications before your next appointment, please call your pharmacy*   Lab Work: None If you have labs (blood work) drawn today and your tests are completely normal, you will receive your results only by: MyChart Message (if you have MyChart) OR A paper copy in the mail If you have any lab test that is abnormal or we need to change your treatment, we will call you to review the results.   Testing/Procedures: Your physician has requested that you have an echocardiogram. Echocardiography is a painless test that uses sound waves to create images of your heart. It provides your doctor with information about the size and shape of your heart and how well your heart's chambers and valves are working. This procedure takes approximately one hour. There are no restrictions for this procedure. Please do NOT wear cologne, perfume, aftershave, or lotions (deodorant is allowed). Please arrive 15 minutes prior to your appointment time.  Please note: We ask at that you not bring children with you during ultrasound (echo/ vascular) testing. Due to room size and safety concerns, children are not allowed in the ultrasound rooms during exams. Our front office staff cannot provide observation of children in our lobby area while testing is being conducted. An adult accompanying a patient to their appointment will only be allowed in the ultrasound room at the discretion of the ultrasound technician under special circumstances. We apologize for any inconvenience.    Follow-Up: At [facility], you and your health needs are our priority.  As part of our continuing mission to provide you with exceptional heart care, we have created designated Provider Care Teams.  These Care Teams include your primary Cardiologist  (physician) and Advanced Practice Providers (APPs -  Physician Assistants and Nurse Practitioners) who all work together to provide you with the care you need, when you need it.  We recommend signing up for the patient portal called "MyChart".  Sign up information is provided on this After Visit Summary.  MyChart is used to connect with patients for Virtual Visits (Telemedicine).  Patients are able to view lab/test results, encounter notes, upcoming appointments, etc.  Non-urgent messages can be sent to your provider as well.   To learn more about what you can do with MyChart, go to [email or web address].    Your next appointment:   1 year(s)  Provider:   Huntley Dec, MD    Other Instructions None

## 2023-02-13 NOTE — Assessment & Plan Note (Signed)
Near optimal blood cholesterol readings however unable to tolerate statins in the past. Notably she also had myalgias and cramps even without statins. Mentions cramps have since resolved being on magnesium supplements.  PCSK9 inhibitors also cause similar symptoms.  Tentatively casted and inclisiran were not cost affordable.  Will retry low-dose of Crestor 2.5 mg every other day. If unable to tolerate, continue with dietary modifications.

## 2023-02-19 IMAGING — DX DG TIBIA/FIBULA 2V*L*
4 series · 4 of 4 positions shown · non-contrast
Comparison: None.

CLINICAL DATA: Left leg pain for several months.

EXAM:
LEFT TIBIA AND FIBULA - 2 VIEW

[tibia ap (1 of 2)]
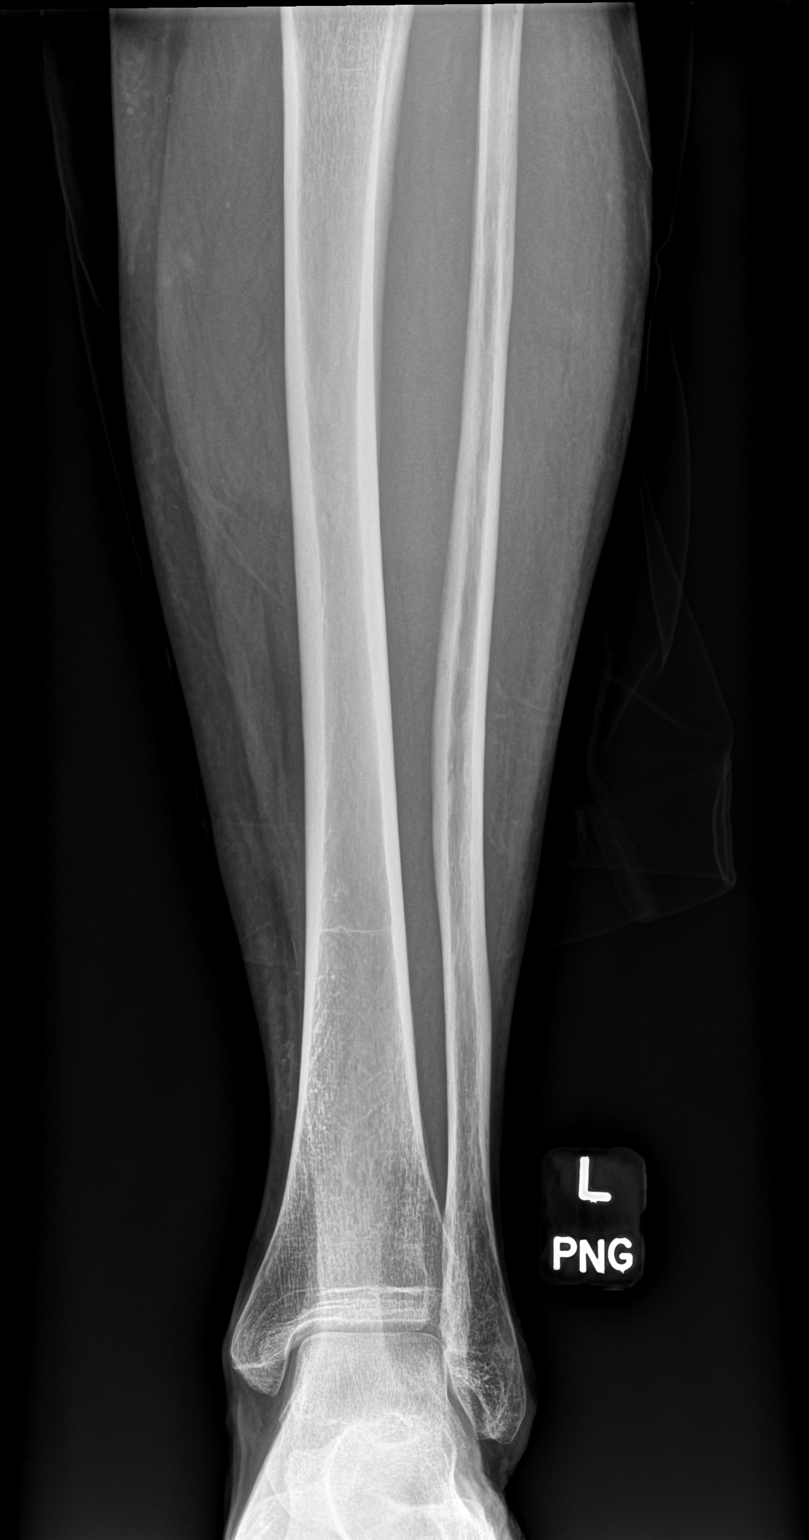

[tibia ap (2 of 2)]
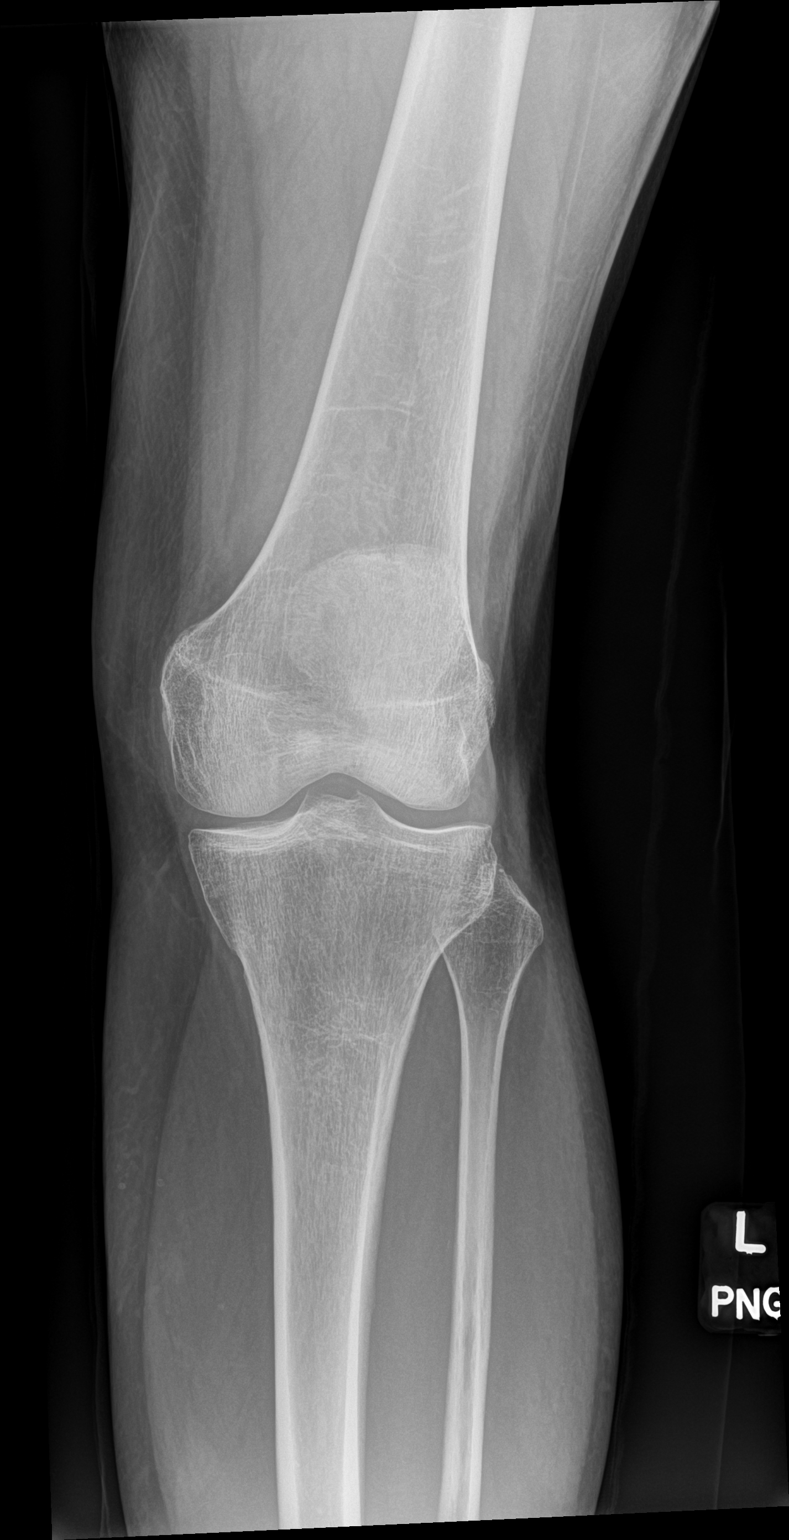

[tibia lat (1 of 2)]
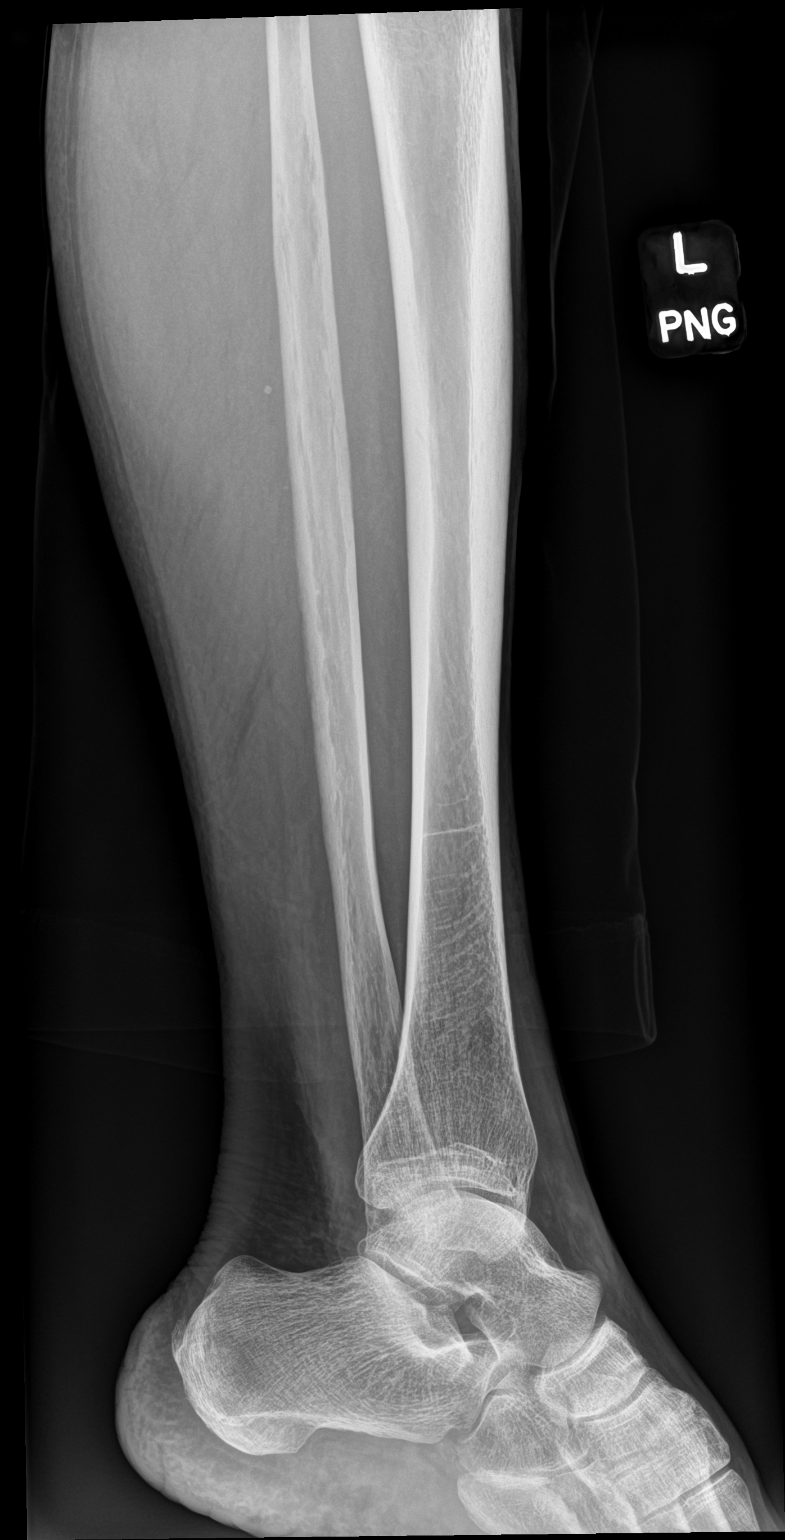

[tibia lat (2 of 2)]
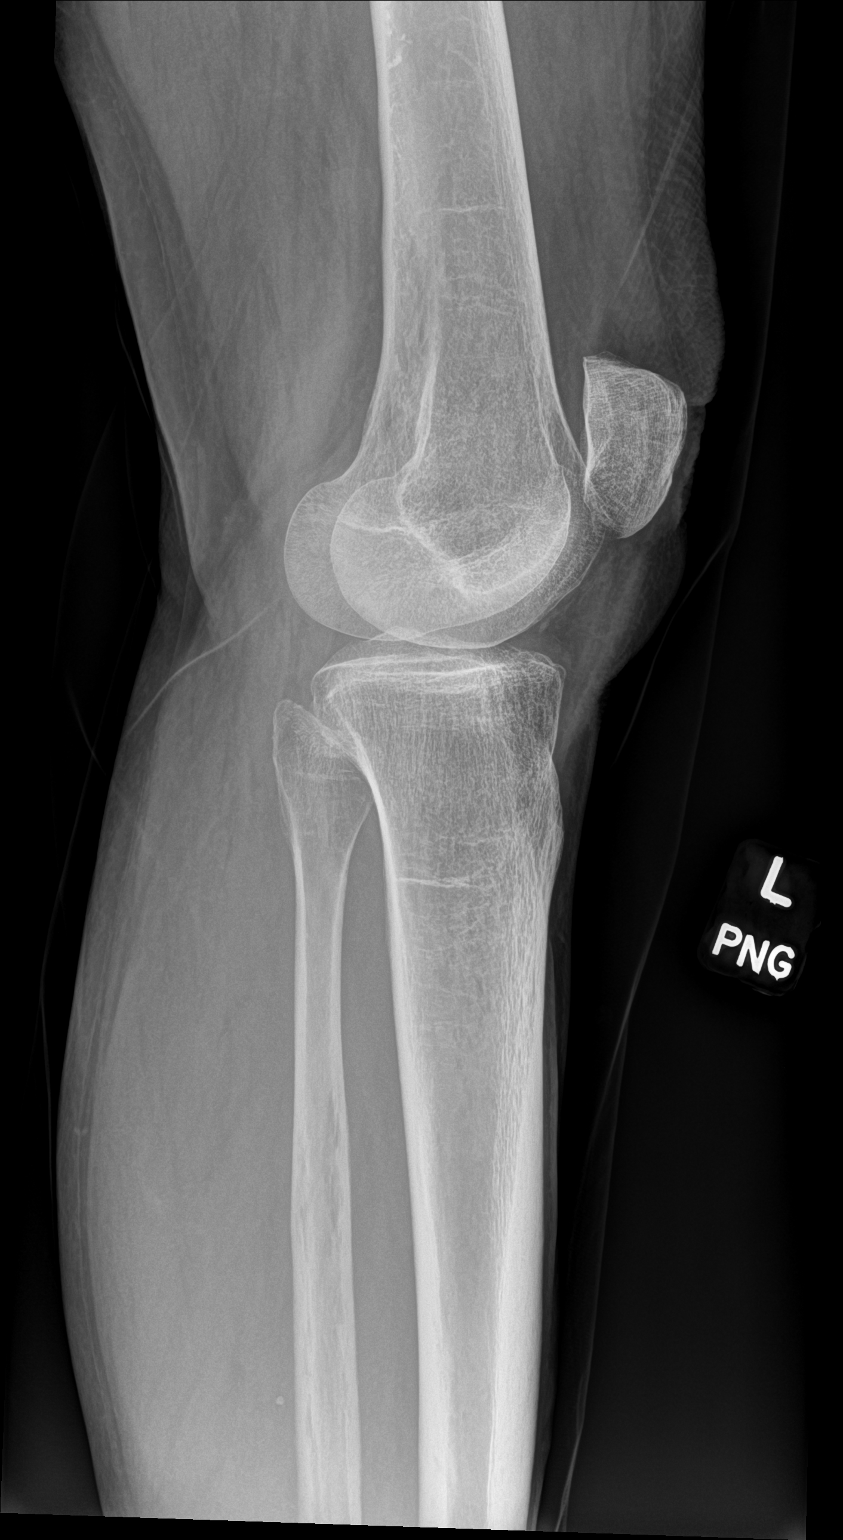

[4 of 4 positions shown; findings below may reference images not displayed]

FINDINGS: There is no evidence of fracture or other focal bone lesions. Soft
tissues are unremarkable.
IMPRESSION: Negative.

## 2023-02-19 IMAGING — DX DG LUMBAR SPINE 2-3V
3 series · 3 of 3 positions shown · non-contrast
Comparison: None.

CLINICAL DATA: Chronic low back pain without known injury.

EXAM:
LUMBAR SPINE - 2-3 VIEW

[l-spine ap]
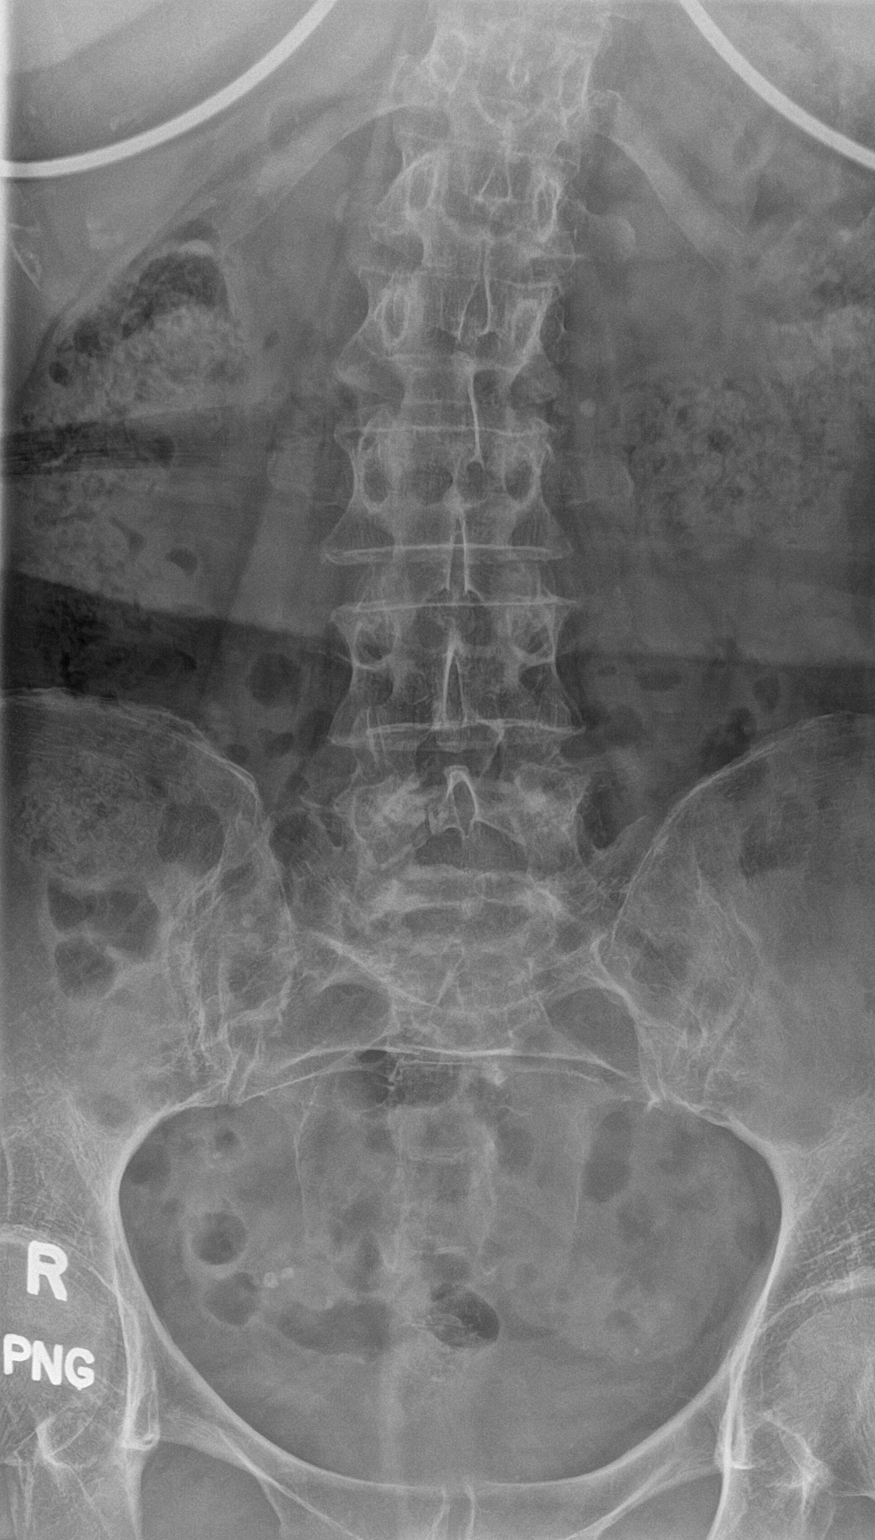

[l-spine lateral (1 of 2)]
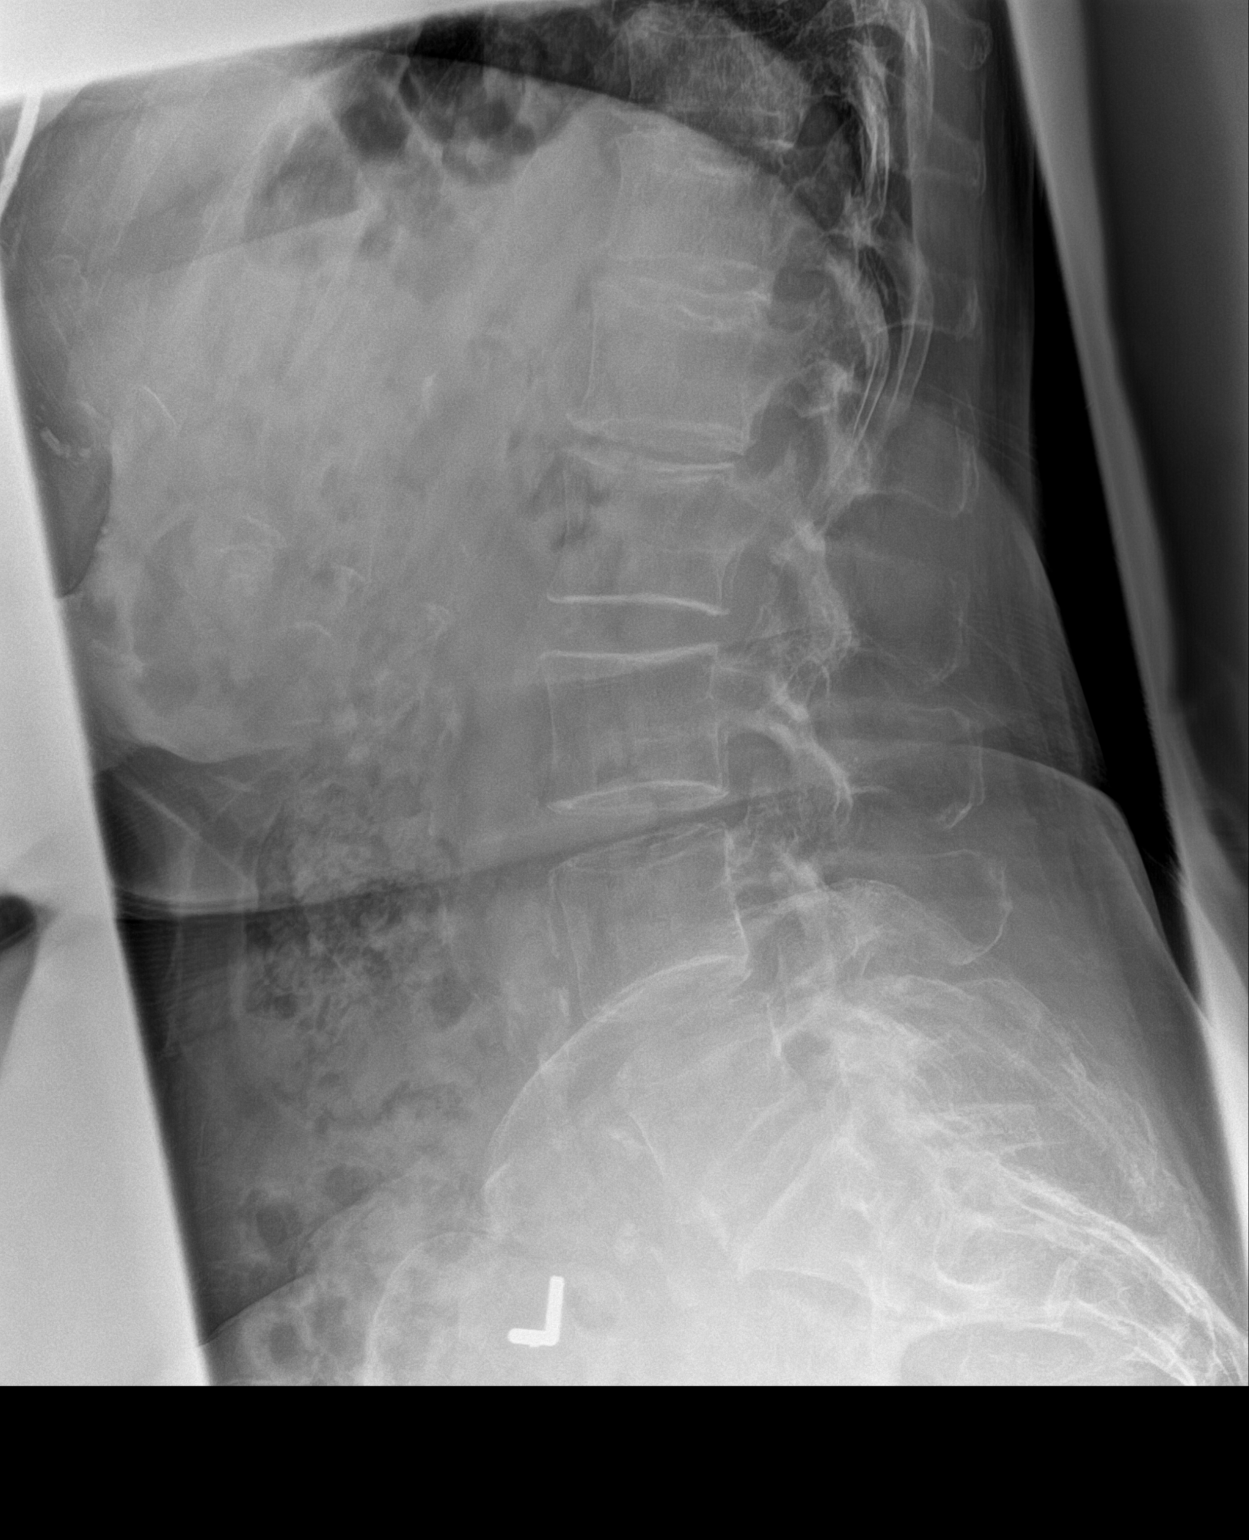

[l-spine lateral (2 of 2)]
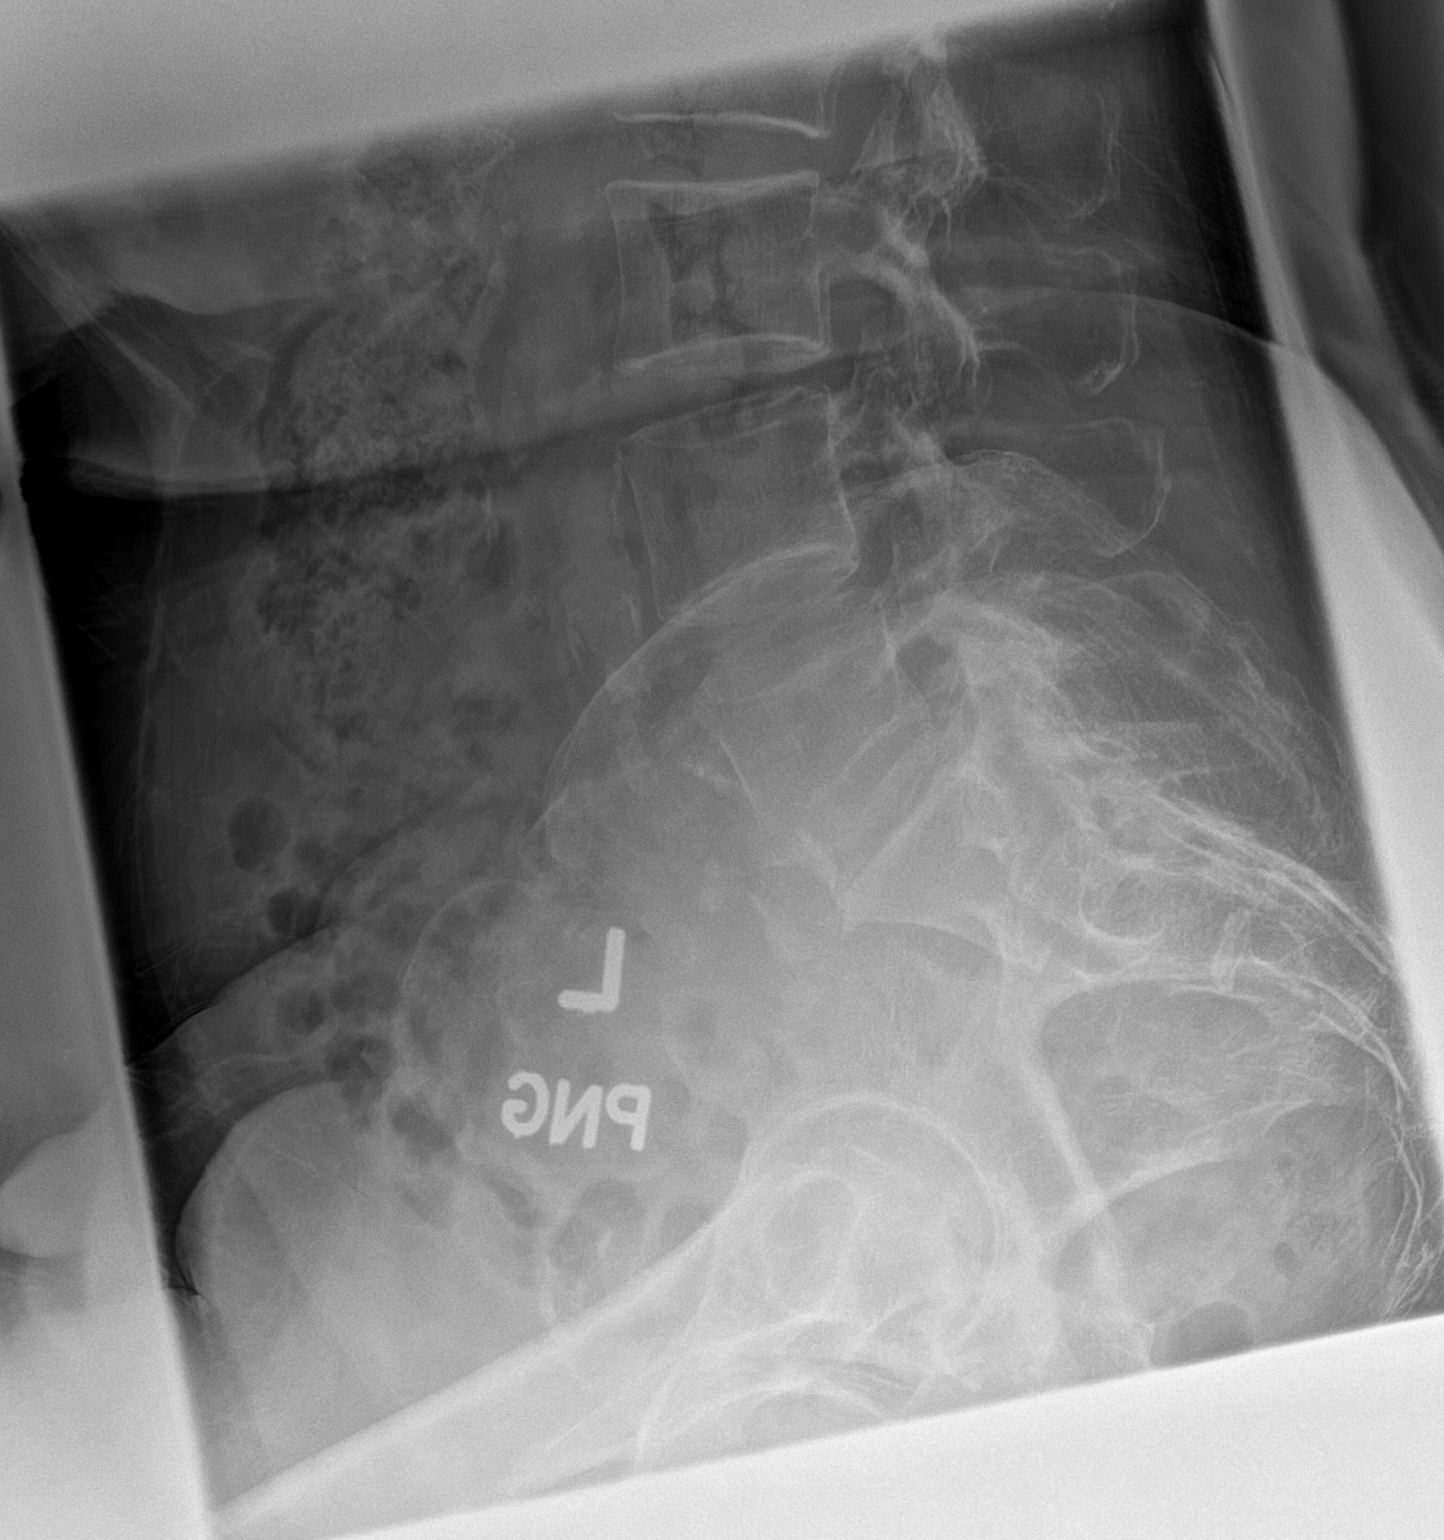

[3 of 3 positions shown; findings below may reference images not displayed]

FINDINGS: No fracture or spondylolisthesis is noted. Diffuse osteopenia is
noted. Moderate degenerative disc disease is noted at L1-2.
Remaining disc spaces are unremarkable.
IMPRESSION: Moderate degenerative changes.  No acute abnormality is seen.

Aortic Atherosclerosis (CNJS1-T35.5).

## 2023-02-20 ENCOUNTER — Other Ambulatory Visit: Payer: Self-pay | Admitting: Internal Medicine

## 2023-02-20 DIAGNOSIS — F419 Anxiety disorder, unspecified: Secondary | ICD-10-CM

## 2023-02-20 NOTE — Telephone Encounter (Signed)
Last Ov 02/07/23 Filled 10/11/22

## 2023-02-27 ENCOUNTER — Inpatient Hospital Stay
Admission: RE | Admit: 2023-02-27 | Discharge: 2023-02-27 | Disposition: A | Discharge status: Home / Self Care | Payer: Medicare Other | Source: Ambulatory Visit | Attending: Internal Medicine | Admitting: Internal Medicine

## 2023-02-27 DIAGNOSIS — R1010 Upper abdominal pain, unspecified: Secondary | ICD-10-CM | POA: Diagnosis not present

## 2023-02-27 MED ORDER — IOPAMIDOL (ISOVUE-300) INJECTION 61%
200.0000 mL | Freq: Once | INTRAVENOUS | Status: AC | PRN
  Administered 2023-02-27: 100 mL via INTRAVENOUS

## 2023-03-07 ENCOUNTER — Telehealth: Payer: Self-pay | Admitting: Internal Medicine

## 2023-03-07 NOTE — Telephone Encounter (Signed)
Patient informed of message  

## 2023-03-07 NOTE — Telephone Encounter (Signed)
CT has not been read by radiologist yet

## 2023-03-07 NOTE — Telephone Encounter (Signed)
Please advise 

## 2023-03-07 NOTE — Telephone Encounter (Signed)
Copied from CRM [id number]. Topic: Clinical - Lab/Test Results >> Mar 07, 2023 11:39 AM Irine Seal wrote: Reason for CRM: PT wanting the results for her latest CT scan

## 2023-03-27 DIAGNOSIS — M51361 Other intervertebral disc degeneration, lumbar region with lower extremity pain only: Secondary | ICD-10-CM | POA: Diagnosis not present

## 2023-03-27 DIAGNOSIS — M5416 Radiculopathy, lumbar region: Secondary | ICD-10-CM | POA: Diagnosis not present

## 2023-03-27 DIAGNOSIS — M5030 Other cervical disc degeneration, unspecified cervical region: Secondary | ICD-10-CM | POA: Diagnosis not present

## 2023-03-27 DIAGNOSIS — M961 Postlaminectomy syndrome, not elsewhere classified: Secondary | ICD-10-CM | POA: Diagnosis not present

## 2023-04-13 DIAGNOSIS — M5412 Radiculopathy, cervical region: Secondary | ICD-10-CM | POA: Diagnosis not present

## 2023-04-23 DIAGNOSIS — D3132 Benign neoplasm of left choroid: Secondary | ICD-10-CM | POA: Diagnosis not present

## 2023-05-13 ENCOUNTER — Other Ambulatory Visit: Payer: Self-pay | Admitting: Physician Assistant

## 2023-05-13 DIAGNOSIS — K219 Gastro-esophageal reflux disease without esophagitis: Secondary | ICD-10-CM

## 2023-05-29 DIAGNOSIS — M5416 Radiculopathy, lumbar region: Secondary | ICD-10-CM | POA: Diagnosis not present

## 2023-05-29 DIAGNOSIS — Z79899 Other long term (current) drug therapy: Secondary | ICD-10-CM | POA: Diagnosis not present

## 2023-05-29 DIAGNOSIS — Z5181 Encounter for therapeutic drug level monitoring: Secondary | ICD-10-CM | POA: Diagnosis not present

## 2023-05-29 DIAGNOSIS — M961 Postlaminectomy syndrome, not elsewhere classified: Secondary | ICD-10-CM | POA: Diagnosis not present

## 2023-05-29 DIAGNOSIS — M5412 Radiculopathy, cervical region: Secondary | ICD-10-CM | POA: Diagnosis not present

## 2023-05-29 DIAGNOSIS — M5030 Other cervical disc degeneration, unspecified cervical region: Secondary | ICD-10-CM | POA: Diagnosis not present

## 2023-06-12 ENCOUNTER — Ambulatory Visit: Payer: Self-pay

## 2023-06-12 NOTE — Telephone Encounter (Signed)
 Chief Complaint: Right side/hip/back pain Symptoms: see above Frequency: since Monday Pertinent Negatives: Patient denies numbness or tingling in leg Disposition: [] ED /[] Urgent Care (no appt availability in office) / [x] Appointment(In office/virtual)/ []  [facility]/ [] Home Care/ [] Refused Recommended Disposition /[] [facility]/ []  Follow-up with PCP Additional Notes: Patient called stating she has been having some right side/back pain since Monday. Patient states she has chronic back pain but this feels different, and almost feels similar to when she had a UTI but states she does not have burning with urination or a fever. Patient states she does feel she is urinating a little more often. Patient has also had nausea for 3 days. Offered patient Urgent Care information but patient states she would rather see PCP. Booked patient for earliest available appt tomorrow and educated on s/s to call back immediately for. Patient verbalized understanding.   Copied from CRM [id number]. Topic: Clinical - Red Word Triage >> Jun 12, 2023  1:25 PM Amanda [name] wrote: Red Word that prompted transfer to Nurse Triage: Pt called with right side pain hip area,and Nausea. Reason for Disposition  [1] MODERATE back pain (e.g., interferes with normal activities) AND [2] present > 3 days  Answer Assessment - Initial Assessment Questions 1. ONSET: "When did the pain begin?"      Monday 2. LOCATION: "Where does it hurt?" (upper, mid or lower back)     Back side, hip area 3. SEVERITY: "How bad is the pain?"  (e.g., Scale 1-10; mild, moderate, or severe)   - MILD (1-3): Doesn't interfere with normal activities.    - MODERATE (4-7): Interferes with normal activities or awakens from sleep.    - SEVERE (8-10): Excruciating pain, unable to do any normal activities.      6-8 4. PATTERN: "Is the pain constant?" (e.g., yes, no; constant, intermittent)      Constant 5. RADIATION: "Does the pain shoot into  your legs or somewhere else?"     No 6. CAUSE:  "What do you think is causing the back pain?"      Patient is unsure 7. BACK OVERUSE:  "Any recent lifting of heavy objects, strenuous work or exercise?"     No 8. MEDICINES: "What have you taken so far for the pain?" (e.g., nothing, acetaminophen, NSAIDS)     Hydrocodone for back pain 9. NEUROLOGIC SYMPTOMS: "Do you have any weakness, numbness, or problems with bowel/bladder control?"     No 10. OTHER SYMPTOMS: "Do you have any other symptoms?" (e.g., fever, abdomen pain, burning with urination, blood in urine)       Nausea  Protocols used: Back Pain-A-AH

## 2023-06-12 NOTE — Telephone Encounter (Signed)
 Noted. Appointment I office tomorrow 06/13/23.

## 2023-06-13 ENCOUNTER — Encounter: Payer: Self-pay | Admitting: Internal Medicine

## 2023-06-13 ENCOUNTER — Ambulatory Visit (INDEPENDENT_AMBULATORY_CARE_PROVIDER_SITE_OTHER): Admitting: Internal Medicine

## 2023-06-13 VITALS — BP 130/80 | HR 74 | Temp 98.0°F | Ht 65.0 in | Wt 169.0 lb

## 2023-06-13 DIAGNOSIS — R110 Nausea: Secondary | ICD-10-CM

## 2023-06-13 DIAGNOSIS — M545 Low back pain, unspecified: Secondary | ICD-10-CM | POA: Diagnosis not present

## 2023-06-13 LAB — POC URINALSYSI DIPSTICK (AUTOMATED)
Bilirubin, UA: NEGATIVE
Blood, UA: NEGATIVE
Glucose, UA: NEGATIVE
Ketones, UA: NEGATIVE
Leukocytes, UA: NEGATIVE
Nitrite, UA: NEGATIVE
Protein, UA: NEGATIVE
Spec Grav, UA: 1.015 (ref 1.010–1.025)
Urobilinogen, UA: 0.2 U/dL
pH, UA: 6.5 (ref 5.0–8.0)

## 2023-06-13 MED ORDER — METHOCARBAMOL 500 MG PO TABS
500.0000 mg | ORAL_TABLET | Freq: Three times a day (TID) | ORAL | 0 refills | Status: DC | PRN
Start: 2023-06-13 — End: 2024-01-07

## 2023-06-13 NOTE — Patient Instructions (Addendum)
 Heating pad , rest  Take muscle relaxant as prescribed

## 2023-06-13 NOTE — Progress Notes (Signed)
 [facility] [address] [place] [address] Dept: [phone] Dept Fax: [phone]  Acute Care Office Visit  Subjective:   Amanda [name] 12-29-1945 06/13/2023  Chief Complaint  Patient presents with   Nausea   Flank Pain    Started on Monday     HPI: Discussed the use of AI scribe software for clinical note transcription with the patient, who gave verbal consent to proceed.  History of Present Illness   The patient presents with a week-long history of right lower back pain and nausea. The pain is localized to the right lower back , no radiation. The patient reports that the pain started upon waking up one morning, with no prior history of heavy lifting.The only notable activity was trying out an elliptical machine at a relative's house the day before the pain started. The patient also reports feeling nauseated, with difficulty eating and occasional regurgitation when eating too much. This has been an ongoing issue being managed with GI,  but seems to have worsened with the onset of the back pain. The patient denies any changes in urination, vomiting, or blood in stools. The pain seems to worsen after walking. The patient has been managing the pain with hydrocodone, which provides some relief.       The following portions of the patient's history were reviewed and updated as appropriate: past medical history, past surgical history, family history, social history, allergies, medications, and problem list.   Patient Active Problem List   Diagnosis Date Noted   Mild aortic insufficiency by transthoracic echocardiogram last evaluated May 2024.  Valve anatomy reported as bicuspid however noted as normal on prior studies 02/13/2023   Mild ascending aorta dilatation (HCC), 4.2 cm by transthoracic echocardiogram May 2024.  Was similar in size on prior CT coronary imaging from January 2020 02/13/2023   Chest discomfort, atypical  appears noncardiac and possibly related to hiatal hernia 02/13/2023   Infrarenal abdominal aortic aneurysm (AAA) without rupture (HCC) 09/17/2022   Hypoglycemia 08/27/2022   Pain in toes of both feet 03/29/2022   Shoulder pain, left 08/31/2021   Pituitary adenoma (HCC) 03/31/2021   Pituitary mass (HCC) 02/14/2021   Mixed stress and urge urinary incontinence 11/29/2020   Sensorineural hearing loss (SNHL) of both ears 10/13/2020   Hyperlipidemia 08/02/2020   PVD (peripheral vascular disease) (HCC) 08/01/2020   Peripheral arterial disease (HCC) 07/26/2020   Basal cell carcinoma (BCC) of right lower leg 06/06/2020   Major depressive disorder with single episode, in full remission (HCC) 01/22/2020   Statin intolerance 01/26/2019   Insomnia due to other mental disorder 12/30/2018   Basal cell carcinoma (BCC) of left ala nasi 09/12/2018   Lumbar spondylosis 12/26/2017   Degeneration of lumbar intervertebral disc 07/09/2017   Lumbar post-laminectomy syndrome 07/09/2017   Pain in joint of right shoulder 07/09/2017   Nonintractable episodic headache 05/20/2017   Herniated lumbar intervertebral disc 11/07/2016   Chronic right sacroiliac joint pain 06/08/2016   Chronic right-sided low back pain with right-sided sciatica 06/08/2016   Trochanteric bursitis of right hip 03/14/2016   Impingement syndrome of right shoulder 03/08/2016   Iliotibial band syndrome of right side 03/08/2016   Right rotator cuff tendonitis 03/08/2016   Anxiety 08/29/2015   Osteoporosis 08/29/2015   Hypocalcemia 08/29/2015   CAD, cath February 05 1569% small nondominant RCA lesion medically treated; CT coronary angio January 2020 calcium score 53, mild proximal LAD disease 04/01/2014   Abnormal stress echocardiogram 01/29/2014  Dyspnea 01/04/2014   Essential hypertension    Melanoma (HCC)    Stricture of esophagus    Diverticulosis    Vitamin D deficiency    Hiatal hernia    Hemorrhoids    Arthritis    GERD  (gastroesophageal reflux disease)    Osteopenia    Back pain 02/05/2011   Family history of malignant neoplasm of gastrointestinal tract 08/21/2010   Dysphagia 08/21/2010   ESOPHAGEAL REFLUX 01/11/2009   Past Medical History:  Diagnosis Date   Anxiety    Arthritis    Back pain    CAD (coronary artery disease)    a. 01/2014: cath showing 70% stenosis of non-dominant RCA --> medically managed.    Cataract    bilat removed   Chest pain 07/22/2014   DDD (degenerative disc disease), cervical    DDD (degenerative disc disease), lumbar    Depression    Diverticulosis    GERD (gastroesophageal reflux disease)    Headache    Hemorrhoids    Hiatal hernia    Hyperlipidemia    Hypertension    Melanoma (HCC)    Orbital fracture (HCC)    right   Osteopenia 07/2015   T score -2.0 FRAX 10%/1.4%   Osteoporosis 08/29/2015   Pituitary tumor    Stricture of esophagus    Vitamin D deficiency    Past Surgical History:  Procedure Laterality Date   ABDOMINAL AORTOGRAM W/LOWER EXTREMITY N/A 08/01/2020   Procedure: ABDOMINAL AORTOGRAM W/LOWER EXTREMITY;  Surgeon: Runell Gess, MD;  Location: [facility];  Service: Cardiovascular;  Laterality: N/A;   CERVICAL DISC SURGERY     x 2   COLONOSCOPY     EXCISION OF SKIN CANCER     Melanoma   FOOT SURGERY Right    HEMORRHOID BANDING     X3   INCONTINENCE SURGERY  12/2020   LEFT HEART CATHETERIZATION WITH CORONARY ANGIOGRAM N/A 02/03/2014   Procedure: LEFT HEART CATHETERIZATION WITH CORONARY ANGIOGRAM;  Surgeon: Micheline Chapman, MD;  Location: [facility] CATH LAB;  Service: Cardiovascular;  Laterality: N/A;   LUMBAR DISC SURGERY  2018   OOPHORECTOMY  2012   BSO   PELVIC LAPAROSCOPY  2012   Diag Lap-BSO-lysis of adhesions   PERIPHERAL VASCULAR INTERVENTION Left 08/01/2020   Procedure: PERIPHERAL VASCULAR INTERVENTION;  Surgeon: Runell Gess, MD;  Location: [facility];  Service: Cardiovascular;  Laterality: Left;   TUBAL  LIGATION     VAGINAL HYSTERECTOMY  1993   Family History  Problem Relation Age of Onset   Heart disease Mother    Hypertension Mother    Stroke Mother    Colon polyps Father    Heart disease Father        CHF   Hypertension Father    Cancer Father        COLON   Colon cancer Father    Colon cancer Sister    Colon polyps Sister    Heart disease Sister    Diabetes Sister    Melanoma Sister    Heart disease Sister    Heart disease Sister    Colon polyps Brother    Diabetes Brother    Heart disease Brother    Cancer Brother        COLON   Melanoma Brother    Colon cancer Brother    Melanoma Brother    Skin cancer Brother    Skin cancer Brother    Prostate cancer Brother  Skin cancer Daughter    Hypertension Daughter    Skin cancer Daughter    Migraines Daughter    Skin cancer Son    Esophageal cancer Neg Hx    Rectal cancer Neg Hx    Stomach cancer Neg Hx    Breast cancer Neg Hx     Current Outpatient Medications:    AMBULATORY NON FORMULARY MEDICATION, Take 2 tablets by mouth every other day as needed (constipation). TAM herbal laxative Take 2 tablet by mouth every other day as needed, Disp: , Rfl:    aspirin EC 81 MG tablet, Take 81 mg by mouth daily. Swallow whole., Disp: , Rfl:    Cholecalciferol (VITAMIN D3) 50 MCG (2000 UT) TABS, Take 2 tablets by mouth daily., Disp: , Rfl:    diclofenac Sodium (VOLTAREN) 1 % GEL, Apply 2-4 g topically as needed (Joint pain)., Disp: , Rfl:    fluticasone (FLONASE) 50 MCG/ACT nasal spray, Place 1 spray into both nostrils daily as needed for allergies., Disp: , Rfl:    hydrochlorothiazide (HYDRODIURIL) 25 MG tablet, Take 1 tablet (25 mg total) by mouth daily., Disp: 90 tablet, Rfl: 3   HYDROcodone-acetaminophen (NORCO) 10-325 MG tablet, Take 1 tablet by mouth every 6 (six) hours as needed (Back pain)., Disp: , Rfl:    LORazepam (ATIVAN) 0.5 MG tablet, TAKE 1 TO 2 TABLETS BY MOUTH ONCE DAILY AS NEEDED FOR ANXIETY, Disp: 60 tablet,  Rfl: 2   losartan (COZAAR) 100 MG tablet, Take 1 tablet (100 mg total) by mouth at bedtime., Disp: 90 tablet, Rfl: 3   MAGNESIUM CITRATE PO, Take 1 tablet by mouth daily., Disp: , Rfl:    pantoprazole (PROTONIX) 40 MG tablet, Take 1 tablet (40 mg total) by mouth 2 (two) times daily before a meal., Disp: 180 tablet, Rfl: 0   rosuvastatin (CRESTOR) 5 MG tablet, Take 0.5 tablets (2.5 mg total) by mouth every other day., Disp: 45 tablet, Rfl: 3   spironolactone (ALDACTONE) 25 MG tablet, Take 25 mg by mouth daily as needed (elevated BP)., Disp: , Rfl:    methocarbamol (ROBAXIN) 500 MG tablet, Take 1 tablet (500 mg total) by mouth every 8 (eight) hours as needed for muscle spasms., Disp: 30 tablet, Rfl: 0 Allergies  Allergen Reactions   Ciprofloxacin Rash   Lisinopril Swelling   Pneumococcal Vaccine Swelling   Pravastatin Other (See Comments)    Muscle aches    Crestor [Rosuvastatin]     myalgias   Evolocumab Other (See Comments)    cramping   Lipitor [Atorvastatin]     myalgias   Lubiprostone     Vomiting    Norvasc [Amlodipine]     swelling   Oxycodone Other (See Comments)   Zetia [Ezetimibe]     "felt horrible"     ROS: A complete ROS was performed with pertinent positives/negatives noted in the HPI. The remainder of the ROS are negative.    Objective:   Today's Vitals   06/13/23 1017  BP: 130/80  Pulse: 74  Temp: 98 F (36.7 C)  TempSrc: Temporal  SpO2: 96%  Weight: 169 lb (76.7 kg)  Height: 5\' 5"  (1.651 m)    GENERAL: Well-appearing, in NAD. Well nourished.  SKIN: Pink, warm and dry. No rash, lesion, ulceration, or ecchymoses.  RESPIRATORY: Chest wall symmetrical. Respirations even and non-labored. Breath sounds clear to auscultation bilaterally.  CARDIAC: S1, S2 present, regular rate and rhythm. Peripheral pulses 2+ bilaterally.  GI: Abdomen soft, non-tender. No CVA tenderness.  MSK: Muscle tone and strength appropriate for age. TTP to R. Lower back . EXTREMITIES:  Without clubbing, cyanosis, or edema.  NEUROLOGIC: No motor or sensory deficits. Steady, even gait.  PSYCH/MENTAL STATUS: Alert, oriented x 3. Cooperative, appropriate mood and affect.    Results for orders placed or performed in visit on 06/13/23  POCT Urinalysis Dipstick (Automated)  Result Value Ref Range   Color, UA yellow    Clarity, UA clear    Glucose, UA Negative Negative   Bilirubin, UA neg    Ketones, UA neg    Spec Grav, UA 1.015 1.010 - 1.025   Blood, UA neg    pH, UA 6.5 5.0 - 8.0   Protein, UA Negative Negative   Urobilinogen, UA 0.2 0.2 or 1.0 E.U./dL   Nitrite, UA neg    Leukocytes, UA Negative Negative      Assessment & Plan:  Assessment and Plan    Acute right-sided lower back pain Acute right-sided lower back pain likely due to musculoskeletal strain.   - UA clear, no UTI or hematuria - Prescribed Robaxin 500 mg every 8 hours as needed. - Advised use of heating pad and stretches. - Recommended rest. - Instructed to take hydrocodone with food to prevent nausea.  Nausea Nausea likely exacerbated by hydrocodone on an empty stomach and pain. - Zofran ODT PRN - patient already has prior Rx from GI - Advised taking hydrocodone with food.      Meds ordered this encounter  Medications   methocarbamol (ROBAXIN) 500 MG tablet    Sig: Take 1 tablet (500 mg total) by mouth every 8 (eight) hours as needed for muscle spasms.    Dispense:  30 tablet    Refill:  0    Supervising Provider:   Garnette Gunner [[id number]]   Orders Placed This Encounter  Procedures   POCT Urinalysis Dipstick (Automated)   Lab Orders         POCT Urinalysis Dipstick (Automated)     No images are attached to the encounter or orders placed in the encounter.  Return in about 4 months (around 10/13/2023) for Chronic Condition follow up and as needed.   [name], FNP

## 2023-06-19 ENCOUNTER — Encounter: Payer: Self-pay | Admitting: Nurse Practitioner

## 2023-06-19 ENCOUNTER — Ambulatory Visit (INDEPENDENT_AMBULATORY_CARE_PROVIDER_SITE_OTHER): Admitting: Nurse Practitioner

## 2023-06-19 VITALS — BP 134/62 | HR 67 | Temp 98.2°F

## 2023-06-19 DIAGNOSIS — R8290 Unspecified abnormal findings in urine: Secondary | ICD-10-CM

## 2023-06-19 DIAGNOSIS — M545 Low back pain, unspecified: Secondary | ICD-10-CM

## 2023-06-19 LAB — URINALYSIS, COMPLETE W/RFL CULTURE
Bacteria, UA: NONE SEEN /HPF
Bilirubin Urine: NEGATIVE
Glucose, UA: NEGATIVE
Hgb urine dipstick: NEGATIVE
Hyaline Cast: NONE SEEN /LPF
Ketones, ur: NEGATIVE
Leukocyte Esterase: NEGATIVE
Nitrites, Initial: NEGATIVE
Protein, ur: NEGATIVE
RBC / HPF: NONE SEEN /HPF (ref 0–2)
Specific Gravity, Urine: 1.01 (ref 1.001–1.035)
WBC, UA: NONE SEEN /HPF (ref 0–5)
pH: 7 (ref 5.0–8.0)

## 2023-06-19 LAB — NO CULTURE INDICATED

## 2023-06-19 NOTE — Progress Notes (Signed)
   Acute Office Visit  Subjective:    Patient ID: Amanda [name], female    DOB: [birth year], 78 y.o.   MRN: [id number]   HPI 78 y.o. presents today for malodorous urine, lower back pain that radiates to RLQ x 2 weeks. Saw PCP 06/13/23 for same complaints.  No known injury other than getting on an elliptical machine day before pain started. Robaxin provided. Also had some hydrocodone as needed. Pain has improved a little bit. Negative UA at that time. Denies vaginal or GI symptoms. S/P TAH BSO.   No LMP recorded. Patient has had a hysterectomy.    Review of Systems  Constitutional: Negative.   Genitourinary:  Negative for difficulty urinating, dysuria, frequency, hematuria, pelvic pain, urgency, vaginal discharge and vaginal pain.       Malodorous urine  Musculoskeletal:  Positive for back pain.       Objective:    Physical Exam Constitutional:      Appearance: Normal appearance.  Abdominal:     Tenderness: There is no right CVA tenderness or left CVA tenderness.     BP 134/62   Pulse 67   Temp 98.2 F (36.8 C) (Oral)   SpO2 100%  Wt Readings from Last 3 Encounters:  06/13/23 169 lb (76.7 kg)  02/13/23 170 lb (77.1 kg)  02/07/23 167 lb 6.4 oz (75.9 kg)        UA: neg leukocytes, neg nitrites, neg blood, neg protein, yellow/clear. Microscopic: wbc none, rbc none, bacteria none. No odor.   Assessment & Plan:   Problem List Items Addressed This Visit       Other   Back pain   Other Visit Diagnoses       Abnormal urine odor    -  Primary   Relevant Orders   Urinalysis,Complete w/RFL Culture      Plan: UA negative. Likely musculoskeletal. Continue rest, heat and muscle relaxer as needed. If no improvement in 2 weeks recommend following up with PCP.   Return if symptoms worsen or fail to improve.    Olivia Mackie DNP, 11:21 AM 06/19/2023

## 2023-06-20 DIAGNOSIS — D2239 Melanocytic nevi of other parts of face: Secondary | ICD-10-CM | POA: Diagnosis not present

## 2023-06-20 DIAGNOSIS — L570 Actinic keratosis: Secondary | ICD-10-CM | POA: Diagnosis not present

## 2023-06-20 DIAGNOSIS — L821 Other seborrheic keratosis: Secondary | ICD-10-CM | POA: Diagnosis not present

## 2023-06-20 DIAGNOSIS — Z85820 Personal history of malignant melanoma of skin: Secondary | ICD-10-CM | POA: Diagnosis not present

## 2023-06-20 DIAGNOSIS — D225 Melanocytic nevi of trunk: Secondary | ICD-10-CM | POA: Diagnosis not present

## 2023-06-24 ENCOUNTER — Encounter (HOSPITAL_BASED_OUTPATIENT_CLINIC_OR_DEPARTMENT_OTHER): Payer: Self-pay

## 2023-06-24 ENCOUNTER — Telehealth: Payer: Self-pay | Admitting: Physician Assistant

## 2023-06-24 ENCOUNTER — Ambulatory Visit (HOSPITAL_BASED_OUTPATIENT_CLINIC_OR_DEPARTMENT_OTHER): Admission: EM | Admit: 2023-06-24 | Discharge: 2023-06-24 | Disposition: A | Discharge status: Home / Self Care

## 2023-06-24 DIAGNOSIS — M7989 Other specified soft tissue disorders: Secondary | ICD-10-CM

## 2023-06-24 DIAGNOSIS — R0602 Shortness of breath: Secondary | ICD-10-CM

## 2023-06-24 DIAGNOSIS — R609 Edema, unspecified: Secondary | ICD-10-CM

## 2023-06-24 NOTE — Telephone Encounter (Signed)
 LVM to call regarding message left earlier today.

## 2023-06-24 NOTE — ED Provider Notes (Signed)
 [facility]    CSN: [id number] Arrival date & time: 06/24/23  1658      History   Chief Complaint Chief Complaint  Patient presents with   Leg Swelling    HPI RUSSIE GULLEDGE is a 78 y.o. female.   Patient is a 78 year old female who presents today with bilateral lower extremity swelling.  Reports that the swelling comes and goes.  This has been consistent for approximately 2 weeks.  Generalized tenderness to lower extremities.  No specific calf pain or swelling.  She does have a good amount of varicose veins.  She has been really anxious about the swelling and concerned about what is going on.  Denies any specific chest pain or shortness of breath.     Past Medical History:  Diagnosis Date   Anxiety    Arthritis    Back pain    CAD (coronary artery disease)    a. 01/2014: cath showing 70% stenosis of non-dominant RCA --> medically managed.    Cataract    bilat removed   Chest pain 07/22/2014   DDD (degenerative disc disease), cervical    DDD (degenerative disc disease), lumbar    Depression    Diverticulosis    GERD (gastroesophageal reflux disease)    Headache    Hemorrhoids    Hiatal hernia    Hyperlipidemia    Hypertension    Melanoma (HCC)    Orbital fracture (HCC)    right   Osteopenia 07/2015   T score -2.0 FRAX 10%/1.4%   Osteoporosis 08/29/2015   Pituitary tumor    Stricture of esophagus    Vitamin D deficiency     Patient Active Problem List   Diagnosis Date Noted   Mild aortic insufficiency by transthoracic echocardiogram last evaluated May 2024.  Valve anatomy reported as bicuspid however noted as normal on prior studies 02/13/2023   Mild ascending aorta dilatation (HCC), 4.2 cm by transthoracic echocardiogram May 2024.  Was similar in size on prior CT coronary imaging from January 2020 02/13/2023   Chest discomfort, atypical appears noncardiac and possibly related to hiatal hernia 02/13/2023   Infrarenal abdominal aortic aneurysm (AAA)  without rupture (HCC) 09/17/2022   Hypoglycemia 08/27/2022   Pain in toes of both feet 03/29/2022   Shoulder pain, left 08/31/2021   Pituitary adenoma (HCC) 03/31/2021   Pituitary mass (HCC) 02/14/2021   Mixed stress and urge urinary incontinence 11/29/2020   Sensorineural hearing loss (SNHL) of both ears 10/13/2020   Hyperlipidemia 08/02/2020   PVD (peripheral vascular disease) (HCC) 08/01/2020   Peripheral arterial disease (HCC) 07/26/2020   Basal cell carcinoma (BCC) of right lower leg 06/06/2020   Major depressive disorder with single episode, in full remission (HCC) 01/22/2020   Statin intolerance 01/26/2019   Insomnia due to other mental disorder 12/30/2018   Basal cell carcinoma (BCC) of left ala nasi 09/12/2018   Lumbar spondylosis 12/26/2017   Degeneration of lumbar intervertebral disc 07/09/2017   Lumbar post-laminectomy syndrome 07/09/2017   Pain in joint of right shoulder 07/09/2017   Nonintractable episodic headache 05/20/2017   Herniated lumbar intervertebral disc 11/07/2016   Chronic right sacroiliac joint pain 06/08/2016   Chronic right-sided low back pain with right-sided sciatica 06/08/2016   Trochanteric bursitis of right hip 03/14/2016   Impingement syndrome of right shoulder 03/08/2016   Iliotibial band syndrome of right side 03/08/2016   Right rotator cuff tendonitis 03/08/2016   Anxiety 08/29/2015   Osteoporosis 08/29/2015   Hypocalcemia 08/29/2015  CAD, cath February 05 1569% small nondominant RCA lesion medically treated; CT coronary angio January 2020 calcium score 53, mild proximal LAD disease 04/01/2014   Abnormal stress echocardiogram 01/29/2014   Dyspnea 01/04/2014   Essential hypertension    Melanoma (HCC)    Stricture of esophagus    Diverticulosis    Vitamin D deficiency    Hiatal hernia    Hemorrhoids    Arthritis    GERD (gastroesophageal reflux disease)    Osteopenia    Back pain 02/05/2011   Family history of malignant neoplasm of  gastrointestinal tract 08/21/2010   Dysphagia 08/21/2010   ESOPHAGEAL REFLUX 01/11/2009    Past Surgical History:  Procedure Laterality Date   ABDOMINAL AORTOGRAM W/LOWER EXTREMITY N/A 08/01/2020   Procedure: ABDOMINAL AORTOGRAM W/LOWER EXTREMITY;  Surgeon: Runell Gess, MD;  Location: [facility];  Service: Cardiovascular;  Laterality: N/A;   CERVICAL DISC SURGERY     x 2   COLONOSCOPY     EXCISION OF SKIN CANCER     Melanoma   FOOT SURGERY Right    HEMORRHOID BANDING     X3   INCONTINENCE SURGERY  12/2020   LEFT HEART CATHETERIZATION WITH CORONARY ANGIOGRAM N/A 02/03/2014   Procedure: LEFT HEART CATHETERIZATION WITH CORONARY ANGIOGRAM;  Surgeon: Micheline Chapman, MD;  Location: [facility] CATH LAB;  Service: Cardiovascular;  Laterality: N/A;   LUMBAR DISC SURGERY  2018   OOPHORECTOMY  2012   BSO   PELVIC LAPAROSCOPY  2012   Diag Lap-BSO-lysis of adhesions   PERIPHERAL VASCULAR INTERVENTION Left 08/01/2020   Procedure: PERIPHERAL VASCULAR INTERVENTION;  Surgeon: Runell Gess, MD;  Location: [facility];  Service: Cardiovascular;  Laterality: Left;   TUBAL LIGATION     VAGINAL HYSTERECTOMY  1993    OB History     Gravida  3   Para  3   Term  3   Preterm      AB      Living  2      SAB      IAB      Ectopic      Multiple      Live Births               Home Medications    Prior to Admission medications   Medication Sig Start Date End Date Taking? Authorizing Provider  AMBULATORY NON FORMULARY MEDICATION Take 2 tablets by mouth every other day as needed (constipation). TAM herbal laxative Take 2 tablet by mouth every other day as needed    [provider]  aspirin EC 81 MG tablet Take 81 mg by mouth daily. Swallow whole.    [provider]  Cholecalciferol (VITAMIN D3) 50 MCG (2000 UT) TABS Take 2 tablets by mouth daily.    [provider]  diclofenac Sodium (VOLTAREN) 1 % GEL Apply 2-4 g topically as  needed (Joint pain).    [provider]  fluticasone (FLONASE) 50 MCG/ACT nasal spray Place 1 spray into both nostrils daily as needed for allergies. 05/31/20   [provider]  hydrochlorothiazide (HYDRODIURIL) 25 MG tablet Take 1 tablet (25 mg total) by mouth daily. 07/17/22   Meriam Sprague, MD  HYDROcodone-acetaminophen (NORCO) 10-325 MG tablet Take 1 tablet by mouth every 6 (six) hours as needed (Back pain).    [provider]  LORazepam (ATIVAN) 0.5 MG tablet TAKE 1 TO 2 TABLETS BY MOUTH ONCE DAILY AS NEEDED FOR ANXIETY 03/05/23  [name], FNP  losartan (COZAAR) 100 MG tablet Take 1 tablet (100 mg total) by mouth at bedtime. 10/04/22   [name], FNP  MAGNESIUM CITRATE PO Take 1 tablet by mouth daily.    [provider]  methocarbamol (ROBAXIN) 500 MG tablet Take 1 tablet (500 mg total) by mouth every 8 (eight) hours as needed for muscle spasms. 06/13/23   [name], FNP  pantoprazole (PROTONIX) 40 MG tablet Take 1 tablet (40 mg total) by mouth 2 (two) times daily before a meal. 05/13/23   Doree Albee, PA-C  rosuvastatin (CRESTOR) 5 MG tablet Take 0.5 tablets (2.5 mg total) by mouth every other day. 02/13/23   Madireddy, Marlyn Corporal, MD  spironolactone (ALDACTONE) 25 MG tablet Take 25 mg by mouth daily as needed (elevated BP).    [provider]    Family History Family History  Problem Relation Age of Onset   Heart disease Mother    Hypertension Mother    Stroke Mother    Colon polyps Father    Heart disease Father        CHF   Hypertension Father    Cancer Father        COLON   Colon cancer Father    Colon cancer Sister    Colon polyps Sister    Heart disease Sister    Diabetes Sister    Melanoma Sister    Heart disease Sister    Heart disease Sister    Colon polyps Brother    Diabetes Brother    Heart disease Brother    Cancer Brother        COLON   Melanoma Brother    Colon cancer Brother    Melanoma  Brother    Skin cancer Brother    Skin cancer Brother    Prostate cancer Brother    Skin cancer Daughter    Hypertension Daughter    Skin cancer Daughter    Migraines Daughter    Skin cancer Son    Esophageal cancer Neg Hx    Rectal cancer Neg Hx    Stomach cancer Neg Hx    Breast cancer Neg Hx     Social History Social History   Tobacco Use   Smoking status: Former    Current packs/day: 0.00    Types: Cigarettes    Start date: 03/19/1974    Quit date: 03/19/1994    Years since quitting: 29.2   Smokeless tobacco: Never  Vaping Use   Vaping status: Never Used  Substance Use Topics   Alcohol use: Not Currently    Comment: not much when younger, nothing for years   Drug use: Yes    Types: Hydrocodone     Allergies   Ciprofloxacin, Lisinopril, Pneumococcal vaccine, Pravastatin, Crestor [rosuvastatin], Evolocumab, Lipitor [atorvastatin], Lubiprostone, Norvasc [amlodipine], Oxycodone, and Zetia [ezetimibe]   Review of Systems Review of Systems See HPI  Physical Exam Triage Vital Signs ED Triage Vitals  Encounter Vitals Group     BP 06/24/23 1731 (!) 143/85     Systolic BP Percentile --      Diastolic BP Percentile --      Pulse Rate 06/24/23 1731 66     Resp 06/24/23 1731 20     Temp 06/24/23 1731 98.1 F (36.7 C)     Temp Source 06/24/23 1731 Oral     SpO2 06/24/23 1731 94 %     Weight --      Height --  Head Circumference --      Peak Flow --      Pain Score 06/24/23 1735 0     Pain Loc --      Pain Education --      Exclude from Growth Chart --    No data found.  Updated Vital Signs BP (!) 143/85 (BP Location: Right Arm)   Pulse 66   Temp 98.1 F (36.7 C) (Oral)   Resp 20   SpO2 94%   Visual Acuity Right Eye Distance:   Left Eye Distance:   Bilateral Distance:    Right Eye Near:   Left Eye Near:    Bilateral Near:     Physical Exam Constitutional:      General: She is not in acute distress.    Appearance: Normal appearance. She is  not ill-appearing or toxic-appearing.  Cardiovascular:     Rate and Rhythm: Normal rate and regular rhythm.     Pulses: Normal pulses.     Heart sounds: Normal heart sounds.  Pulmonary:     Effort: Pulmonary effort is normal.     Breath sounds: Normal breath sounds.  Musculoskeletal:     Comments: Trace non pitting edema to BLE. Varicose veins prominent. TTP generalized.  No calf tenderness or swelling.  Color and temperature normal.  2+ pedal pulses bilateral  Neurological:     Mental Status: She is alert.      UC Treatments / Results  Labs (all labs ordered are listed, but only abnormal results are displayed) Labs Reviewed - No data to display  EKG   Radiology No results found.  Procedures Procedures (including critical care time)  Medications Ordered in UC Medications - No data to display  Initial Impression / Assessment and Plan / UC Course  I have reviewed the triage vital signs and the nursing notes.  Pertinent labs & imaging results that were available during my care of the patient were reviewed by me and considered in my medical decision making (see chart for details).     Leg swelling-very trace if any bilateral lower extremity swelling.  Exam mostly normal besides prominent varicose veins.  No concern for DVT today.  No specific concern for cardiac cause.  May be more vascular in nature.  She has good temperature, color and circulation. Recommend to contact her doctor in the morning for plan and further evaluation.  Looking at telephone encounter from earlier today were going to order some blood work and an echocardiogram. Patient aware Follow-up as needed Final Clinical Impressions(s) / UC Diagnoses   Final diagnoses:  Leg swelling     Discharge Instructions      I am not seeing anything concerning on exam here today.  I would recommend follow-up with your doctor as planned see if they want to do further testing.     ED Prescriptions   None     PDMP not reviewed this encounter.   Janace Aris, FNP 06/24/23 1913

## 2023-06-24 NOTE — Discharge Instructions (Addendum)
 I am not seeing anything concerning on exam here today.  I would recommend follow-up with your doctor as planned see if they want to do further testing.

## 2023-06-24 NOTE — Telephone Encounter (Signed)
 Spoke with pt over the phone and she stated that her feet and legs have been swollen for the last 2-3 weeks, feels chest tightness but not SOB (not chest pain), pt's denies weight gain, feet hurt, Hydrochlorothiazide 25 mg once daily and denies any skipped doses. Explained to pt that we will send this to Dr. Madireddy's office for advisement. 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

## 2023-06-24 NOTE — Telephone Encounter (Signed)
 Pt c/o swelling/edema: STAT if pt has developed SOB within 24 hours  If swelling, where is the swelling located? Feet/ankles (More on the right side then the left)  How much weight have you gained and in what time span? Not sure  Have you gained 2 pounds in a day or 5 pounds in a week? Not sure  Do you have a log of your daily weights (if so, list)? no  Are you currently taking a fluid pill? yes  Are you currently SOB? yes  Have you traveled recently in a car or plane for an extended period of time?  no

## 2023-06-24 NOTE — ED Triage Notes (Signed)
 Bilat lower extremity swelling x 2 weeks. Patient states she did not receive any messages from doctors office today regarding her follow up. States feels nervous and concerned that this has been going on for too long. Also reporting difficulty swallowing food. Appt with gastroenterologist in June.

## 2023-06-25 DIAGNOSIS — R609 Edema, unspecified: Secondary | ICD-10-CM | POA: Diagnosis not present

## 2023-06-25 DIAGNOSIS — R0602 Shortness of breath: Secondary | ICD-10-CM | POA: Diagnosis not present

## 2023-06-25 NOTE — Telephone Encounter (Signed)
 Recommendations reviewed with pt as per Dr. Madireddy's note.  Pt verbalized understanding and had no additional questions.

## 2023-06-25 NOTE — Telephone Encounter (Signed)
 Patient returned RN's call.

## 2023-06-25 NOTE — Addendum Note (Signed)
 Addended by: Eleonore Chiquito on: 06/25/2023 08:57 AM   Modules accepted: Orders

## 2023-06-26 LAB — CBC
Hematocrit: 38.9 % (ref 34.0–46.6)
Hemoglobin: 12.9 g/dL (ref 11.1–15.9)
MCH: 31.8 pg (ref 26.6–33.0)
MCHC: 33.2 g/dL (ref 31.5–35.7)
MCV: 96 fL (ref 79–97)
Platelets: 221 10*3/uL (ref 150–450)
RBC: 4.06 x10E6/uL (ref 3.77–5.28)
RDW: 12.2 % (ref 11.7–15.4)
WBC: 4.1 10*3/uL (ref 3.4–10.8)

## 2023-06-26 LAB — COMPREHENSIVE METABOLIC PANEL WITH GFR
ALT: 11 IU/L (ref 0–32)
AST: 19 IU/L (ref 0–40)
Albumin: 4.2 g/dL (ref 3.8–4.8)
Alkaline Phosphatase: 79 IU/L (ref 44–121)
BUN/Creatinine Ratio: 9 — ABNORMAL LOW (ref 12–28)
BUN: 10 mg/dL (ref 8–27)
Bilirubin Total: 0.3 mg/dL (ref 0.0–1.2)
CO2: 25 mmol/L (ref 20–29)
Calcium: 8.9 mg/dL (ref 8.7–10.3)
Chloride: 103 mmol/L (ref 96–106)
Creatinine, Ser: 1.08 mg/dL — ABNORMAL HIGH (ref 0.57–1.00)
Globulin, Total: 1.9 g/dL (ref 1.5–4.5)
Glucose: 80 mg/dL (ref 70–99)
Potassium: 3.9 mmol/L (ref 3.5–5.2)
Sodium: 142 mmol/L (ref 134–144)
Total Protein: 6.1 g/dL (ref 6.0–8.5)
eGFR: 54 mL/min/{1.73_m2} — ABNORMAL LOW (ref >59)

## 2023-06-26 LAB — PRO B NATRIURETIC PEPTIDE: NT-Pro BNP: 272 pg/mL (ref 0–738)

## 2023-06-26 LAB — MAGNESIUM: Magnesium: 1.9 mg/dL (ref 1.6–2.3)

## 2023-06-27 ENCOUNTER — Telehealth: Payer: Self-pay

## 2023-06-27 NOTE — Telephone Encounter (Signed)
 Follow Up:     Patient is calling back to see if her lab results are ready from Tuesday(06-25-23)?

## 2023-06-27 NOTE — Telephone Encounter (Signed)
 Results reviewed with pt as per Dr. Madireddy's note.  Pt verbalized understanding and had no additional questions. Routed to PCP

## 2023-06-27 NOTE — Progress Notes (Signed)
 Please inform her her blood work results show normal electrolytes, stable kidney function, normal liver function parameters and normal blood counts.  Heart failure indicator was also within normal limits.  Will review the echocardiogram results once available.

## 2023-07-19 ENCOUNTER — Ambulatory Visit: Attending: Cardiology

## 2023-07-19 ENCOUNTER — Other Ambulatory Visit (HOSPITAL_COMMUNITY): Payer: Medicare Other

## 2023-07-19 DIAGNOSIS — I77810 Thoracic aortic ectasia: Secondary | ICD-10-CM | POA: Diagnosis not present

## 2023-07-21 LAB — ECHOCARDIOGRAM COMPLETE
AR max vel: 2.03 cm2
AV Area VTI: 2.16 cm2
AV Area mean vel: 2.05 cm2
AV Mean grad: 5.5 mmHg
AV Peak grad: 10.3 mmHg
Ao pk vel: 1.6 m/s
Area-P 1/2: 2.99 cm2
S' Lateral: 2.9 cm

## 2023-07-23 DIAGNOSIS — M25511 Pain in right shoulder: Secondary | ICD-10-CM | POA: Diagnosis not present

## 2023-08-05 DIAGNOSIS — M25531 Pain in right wrist: Secondary | ICD-10-CM | POA: Diagnosis not present

## 2023-08-05 DIAGNOSIS — K5730 Diverticulosis of large intestine without perforation or abscess without bleeding: Secondary | ICD-10-CM | POA: Diagnosis not present

## 2023-08-05 DIAGNOSIS — F32A Depression, unspecified: Secondary | ICD-10-CM | POA: Diagnosis not present

## 2023-08-05 DIAGNOSIS — R609 Edema, unspecified: Secondary | ICD-10-CM | POA: Diagnosis not present

## 2023-08-05 DIAGNOSIS — Z7982 Long term (current) use of aspirin: Secondary | ICD-10-CM | POA: Diagnosis not present

## 2023-08-05 DIAGNOSIS — S32591A Other specified fracture of right pubis, initial encounter for closed fracture: Secondary | ICD-10-CM | POA: Diagnosis not present

## 2023-08-05 DIAGNOSIS — W1789XA Other fall from one level to another, initial encounter: Secondary | ICD-10-CM | POA: Diagnosis not present

## 2023-08-05 DIAGNOSIS — M160 Bilateral primary osteoarthritis of hip: Secondary | ICD-10-CM | POA: Diagnosis not present

## 2023-08-05 DIAGNOSIS — Z23 Encounter for immunization: Secondary | ICD-10-CM | POA: Diagnosis not present

## 2023-08-05 DIAGNOSIS — Z79899 Other long term (current) drug therapy: Secondary | ICD-10-CM | POA: Diagnosis not present

## 2023-08-05 DIAGNOSIS — S52351A Displaced comminuted fracture of shaft of radius, right arm, initial encounter for closed fracture: Secondary | ICD-10-CM | POA: Diagnosis not present

## 2023-08-05 DIAGNOSIS — S52531A Colles' fracture of right radius, initial encounter for closed fracture: Secondary | ICD-10-CM | POA: Diagnosis not present

## 2023-08-05 DIAGNOSIS — I2510 Atherosclerotic heart disease of native coronary artery without angina pectoris: Secondary | ICD-10-CM | POA: Diagnosis not present

## 2023-08-05 DIAGNOSIS — I10 Essential (primary) hypertension: Secondary | ICD-10-CM | POA: Diagnosis not present

## 2023-08-05 DIAGNOSIS — M25532 Pain in left wrist: Secondary | ICD-10-CM | POA: Diagnosis not present

## 2023-08-05 DIAGNOSIS — K219 Gastro-esophageal reflux disease without esophagitis: Secondary | ICD-10-CM | POA: Diagnosis not present

## 2023-08-05 DIAGNOSIS — S32511A Fracture of superior rim of right pubis, initial encounter for closed fracture: Secondary | ICD-10-CM | POA: Diagnosis not present

## 2023-08-05 DIAGNOSIS — R079 Chest pain, unspecified: Secondary | ICD-10-CM | POA: Diagnosis not present

## 2023-08-05 DIAGNOSIS — E782 Mixed hyperlipidemia: Secondary | ICD-10-CM | POA: Diagnosis not present

## 2023-08-05 DIAGNOSIS — K7460 Unspecified cirrhosis of liver: Secondary | ICD-10-CM | POA: Diagnosis not present

## 2023-08-05 DIAGNOSIS — S32401A Unspecified fracture of right acetabulum, initial encounter for closed fracture: Secondary | ICD-10-CM | POA: Diagnosis not present

## 2023-08-05 DIAGNOSIS — Z87891 Personal history of nicotine dependence: Secondary | ICD-10-CM | POA: Diagnosis not present

## 2023-08-05 DIAGNOSIS — I119 Hypertensive heart disease without heart failure: Secondary | ICD-10-CM | POA: Diagnosis not present

## 2023-08-05 DIAGNOSIS — R0989 Other specified symptoms and signs involving the circulatory and respiratory systems: Secondary | ICD-10-CM | POA: Diagnosis not present

## 2023-08-05 DIAGNOSIS — S79911A Unspecified injury of right hip, initial encounter: Secondary | ICD-10-CM | POA: Diagnosis not present

## 2023-08-05 DIAGNOSIS — S52611A Displaced fracture of right ulna styloid process, initial encounter for closed fracture: Secondary | ICD-10-CM | POA: Diagnosis not present

## 2023-08-05 DIAGNOSIS — F419 Anxiety disorder, unspecified: Secondary | ICD-10-CM | POA: Diagnosis not present

## 2023-08-05 DIAGNOSIS — M25551 Pain in right hip: Secondary | ICD-10-CM | POA: Diagnosis not present

## 2023-08-06 DIAGNOSIS — S32591A Other specified fracture of right pubis, initial encounter for closed fracture: Secondary | ICD-10-CM | POA: Diagnosis not present

## 2023-08-13 DIAGNOSIS — M1611 Unilateral primary osteoarthritis, right hip: Secondary | ICD-10-CM | POA: Diagnosis not present

## 2023-08-13 DIAGNOSIS — S52531D Colles' fracture of right radius, subsequent encounter for closed fracture with routine healing: Secondary | ICD-10-CM | POA: Diagnosis not present

## 2023-08-13 DIAGNOSIS — F419 Anxiety disorder, unspecified: Secondary | ICD-10-CM | POA: Diagnosis not present

## 2023-08-13 DIAGNOSIS — F32A Depression, unspecified: Secondary | ICD-10-CM | POA: Diagnosis not present

## 2023-08-13 DIAGNOSIS — W19XXXD Unspecified fall, subsequent encounter: Secondary | ICD-10-CM | POA: Diagnosis not present

## 2023-08-13 DIAGNOSIS — K219 Gastro-esophageal reflux disease without esophagitis: Secondary | ICD-10-CM | POA: Diagnosis not present

## 2023-08-13 DIAGNOSIS — E785 Hyperlipidemia, unspecified: Secondary | ICD-10-CM | POA: Diagnosis not present

## 2023-08-13 DIAGNOSIS — Z9181 History of falling: Secondary | ICD-10-CM | POA: Diagnosis not present

## 2023-08-13 DIAGNOSIS — S32591D Other specified fracture of right pubis, subsequent encounter for fracture with routine healing: Secondary | ICD-10-CM | POA: Diagnosis not present

## 2023-08-13 DIAGNOSIS — S52614D Nondisplaced fracture of right ulna styloid process, subsequent encounter for closed fracture with routine healing: Secondary | ICD-10-CM | POA: Diagnosis not present

## 2023-08-13 DIAGNOSIS — I2510 Atherosclerotic heart disease of native coronary artery without angina pectoris: Secondary | ICD-10-CM | POA: Diagnosis not present

## 2023-08-13 DIAGNOSIS — K7460 Unspecified cirrhosis of liver: Secondary | ICD-10-CM | POA: Diagnosis not present

## 2023-08-13 DIAGNOSIS — I10 Essential (primary) hypertension: Secondary | ICD-10-CM | POA: Diagnosis not present

## 2023-08-14 ENCOUNTER — Telehealth: Payer: Self-pay

## 2023-08-14 DIAGNOSIS — M25531 Pain in right wrist: Secondary | ICD-10-CM | POA: Diagnosis not present

## 2023-08-14 NOTE — Telephone Encounter (Signed)
 Yes - approval of verbal orders for PT : 2 times a week for 3 weeks , then 1 time a week for 6 weeks.

## 2023-08-14 NOTE — Telephone Encounter (Signed)
 Left a detailed message informing Athena Bland of the verbal orders.

## 2023-08-14 NOTE — Telephone Encounter (Signed)
 Please advise Copied from CRM [id number]. Topic: General - Other >> Aug 14, 2023  3:54 PM Dorisann Garre T wrote: Reason for CRM: patient is needing a referral to emergency ortho    [phone] dawn daughter she has a broken wrist and hip

## 2023-08-14 NOTE — Telephone Encounter (Signed)
 Please advise  Copied from CRM [id number]. Topic: Clinical - Home Health Verbal Orders >> Aug 14, 2023  8:43 AM Albertha Alosa wrote: Caller/Agency: [facility]  Callback Number: [phone] Service Requested: Physical Therapy - following fall right pelvis fracture and wrist fracture  Frequency: 2 times a week for 3 weeks , 1 time a week for 6 weeks  Any new concerns about the patient? No

## 2023-08-15 ENCOUNTER — Other Ambulatory Visit: Payer: Self-pay | Admitting: Internal Medicine

## 2023-08-15 DIAGNOSIS — F419 Anxiety disorder, unspecified: Secondary | ICD-10-CM

## 2023-08-15 NOTE — Telephone Encounter (Signed)
 Last Ov 06/13/23 Filled 02/20/23

## 2023-08-16 DIAGNOSIS — S32591D Other specified fracture of right pubis, subsequent encounter for fracture with routine healing: Secondary | ICD-10-CM | POA: Diagnosis not present

## 2023-08-16 DIAGNOSIS — Z9181 History of falling: Secondary | ICD-10-CM | POA: Diagnosis not present

## 2023-08-16 DIAGNOSIS — W19XXXD Unspecified fall, subsequent encounter: Secondary | ICD-10-CM | POA: Diagnosis not present

## 2023-08-16 DIAGNOSIS — I2510 Atherosclerotic heart disease of native coronary artery without angina pectoris: Secondary | ICD-10-CM | POA: Diagnosis not present

## 2023-08-16 DIAGNOSIS — K219 Gastro-esophageal reflux disease without esophagitis: Secondary | ICD-10-CM | POA: Diagnosis not present

## 2023-08-16 DIAGNOSIS — S52614D Nondisplaced fracture of right ulna styloid process, subsequent encounter for closed fracture with routine healing: Secondary | ICD-10-CM | POA: Diagnosis not present

## 2023-08-16 DIAGNOSIS — S52531D Colles' fracture of right radius, subsequent encounter for closed fracture with routine healing: Secondary | ICD-10-CM | POA: Diagnosis not present

## 2023-08-16 DIAGNOSIS — K7460 Unspecified cirrhosis of liver: Secondary | ICD-10-CM | POA: Diagnosis not present

## 2023-08-16 DIAGNOSIS — E785 Hyperlipidemia, unspecified: Secondary | ICD-10-CM | POA: Diagnosis not present

## 2023-08-16 DIAGNOSIS — M1611 Unilateral primary osteoarthritis, right hip: Secondary | ICD-10-CM | POA: Diagnosis not present

## 2023-08-16 DIAGNOSIS — F32A Depression, unspecified: Secondary | ICD-10-CM | POA: Diagnosis not present

## 2023-08-16 DIAGNOSIS — F419 Anxiety disorder, unspecified: Secondary | ICD-10-CM | POA: Diagnosis not present

## 2023-08-16 DIAGNOSIS — I10 Essential (primary) hypertension: Secondary | ICD-10-CM | POA: Diagnosis not present

## 2023-08-16 NOTE — Telephone Encounter (Signed)
 Dawn was notified of message

## 2023-08-16 NOTE — Telephone Encounter (Signed)
 Please notify the patient I see that she is currently in with orthopedic with [facility] for her recent fracture management.  I recommend she continue follow-up with them.  She has been seen at [facility] in the past, so she won't need a referral.  She can call to set up an appointment if she chooses to change her care to [facility].

## 2023-08-20 DIAGNOSIS — F419 Anxiety disorder, unspecified: Secondary | ICD-10-CM | POA: Diagnosis not present

## 2023-08-20 DIAGNOSIS — S52614D Nondisplaced fracture of right ulna styloid process, subsequent encounter for closed fracture with routine healing: Secondary | ICD-10-CM | POA: Diagnosis not present

## 2023-08-20 DIAGNOSIS — Z9181 History of falling: Secondary | ICD-10-CM | POA: Diagnosis not present

## 2023-08-20 DIAGNOSIS — S32591D Other specified fracture of right pubis, subsequent encounter for fracture with routine healing: Secondary | ICD-10-CM | POA: Diagnosis not present

## 2023-08-20 DIAGNOSIS — K7460 Unspecified cirrhosis of liver: Secondary | ICD-10-CM | POA: Diagnosis not present

## 2023-08-20 DIAGNOSIS — E785 Hyperlipidemia, unspecified: Secondary | ICD-10-CM | POA: Diagnosis not present

## 2023-08-20 DIAGNOSIS — K219 Gastro-esophageal reflux disease without esophagitis: Secondary | ICD-10-CM | POA: Diagnosis not present

## 2023-08-20 DIAGNOSIS — F32A Depression, unspecified: Secondary | ICD-10-CM | POA: Diagnosis not present

## 2023-08-20 DIAGNOSIS — M1611 Unilateral primary osteoarthritis, right hip: Secondary | ICD-10-CM | POA: Diagnosis not present

## 2023-08-20 DIAGNOSIS — S52531D Colles' fracture of right radius, subsequent encounter for closed fracture with routine healing: Secondary | ICD-10-CM | POA: Diagnosis not present

## 2023-08-20 DIAGNOSIS — W19XXXD Unspecified fall, subsequent encounter: Secondary | ICD-10-CM | POA: Diagnosis not present

## 2023-08-20 DIAGNOSIS — I10 Essential (primary) hypertension: Secondary | ICD-10-CM | POA: Diagnosis not present

## 2023-08-20 DIAGNOSIS — I2510 Atherosclerotic heart disease of native coronary artery without angina pectoris: Secondary | ICD-10-CM | POA: Diagnosis not present

## 2023-08-23 DIAGNOSIS — E785 Hyperlipidemia, unspecified: Secondary | ICD-10-CM | POA: Diagnosis not present

## 2023-08-23 DIAGNOSIS — I2510 Atherosclerotic heart disease of native coronary artery without angina pectoris: Secondary | ICD-10-CM | POA: Diagnosis not present

## 2023-08-23 DIAGNOSIS — S52531D Colles' fracture of right radius, subsequent encounter for closed fracture with routine healing: Secondary | ICD-10-CM | POA: Diagnosis not present

## 2023-08-23 DIAGNOSIS — F32A Depression, unspecified: Secondary | ICD-10-CM | POA: Diagnosis not present

## 2023-08-23 DIAGNOSIS — F419 Anxiety disorder, unspecified: Secondary | ICD-10-CM | POA: Diagnosis not present

## 2023-08-23 DIAGNOSIS — S52614D Nondisplaced fracture of right ulna styloid process, subsequent encounter for closed fracture with routine healing: Secondary | ICD-10-CM | POA: Diagnosis not present

## 2023-08-23 DIAGNOSIS — W19XXXD Unspecified fall, subsequent encounter: Secondary | ICD-10-CM | POA: Diagnosis not present

## 2023-08-23 DIAGNOSIS — K219 Gastro-esophageal reflux disease without esophagitis: Secondary | ICD-10-CM | POA: Diagnosis not present

## 2023-08-23 DIAGNOSIS — S32591D Other specified fracture of right pubis, subsequent encounter for fracture with routine healing: Secondary | ICD-10-CM | POA: Diagnosis not present

## 2023-08-23 DIAGNOSIS — I10 Essential (primary) hypertension: Secondary | ICD-10-CM | POA: Diagnosis not present

## 2023-08-23 DIAGNOSIS — Z9181 History of falling: Secondary | ICD-10-CM | POA: Diagnosis not present

## 2023-08-23 DIAGNOSIS — K7460 Unspecified cirrhosis of liver: Secondary | ICD-10-CM | POA: Diagnosis not present

## 2023-08-23 DIAGNOSIS — M1611 Unilateral primary osteoarthritis, right hip: Secondary | ICD-10-CM | POA: Diagnosis not present

## 2023-08-26 ENCOUNTER — Telehealth: Payer: Self-pay | Admitting: Internal Medicine

## 2023-08-26 DIAGNOSIS — S52501D Unspecified fracture of the lower end of right radius, subsequent encounter for closed fracture with routine healing: Secondary | ICD-10-CM | POA: Diagnosis not present

## 2023-08-26 DIAGNOSIS — S52601D Unspecified fracture of lower end of right ulna, subsequent encounter for closed fracture with routine healing: Secondary | ICD-10-CM | POA: Diagnosis not present

## 2023-08-26 DIAGNOSIS — M25531 Pain in right wrist: Secondary | ICD-10-CM | POA: Diagnosis not present

## 2023-08-26 NOTE — Telephone Encounter (Signed)
Forms received and placed in PCP folder.

## 2023-08-26 NOTE — Telephone Encounter (Signed)
 Patient dropped off document Home Health Certificate (Order ID [id number]), to be filled out by provider. Patient requested to send it back via Fax within 7-days. Document is located in providers tray at front office.Please advise at Mobile [phone] (mobile) home health order by fax. I put in dr box

## 2023-08-27 ENCOUNTER — Ambulatory Visit: Admitting: Gastroenterology

## 2023-08-27 DIAGNOSIS — M1611 Unilateral primary osteoarthritis, right hip: Secondary | ICD-10-CM | POA: Diagnosis not present

## 2023-08-27 DIAGNOSIS — K219 Gastro-esophageal reflux disease without esophagitis: Secondary | ICD-10-CM | POA: Diagnosis not present

## 2023-08-27 DIAGNOSIS — S32591D Other specified fracture of right pubis, subsequent encounter for fracture with routine healing: Secondary | ICD-10-CM | POA: Diagnosis not present

## 2023-08-27 DIAGNOSIS — Z9181 History of falling: Secondary | ICD-10-CM | POA: Diagnosis not present

## 2023-08-27 DIAGNOSIS — F32A Depression, unspecified: Secondary | ICD-10-CM | POA: Diagnosis not present

## 2023-08-27 DIAGNOSIS — I10 Essential (primary) hypertension: Secondary | ICD-10-CM | POA: Diagnosis not present

## 2023-08-27 DIAGNOSIS — I2510 Atherosclerotic heart disease of native coronary artery without angina pectoris: Secondary | ICD-10-CM | POA: Diagnosis not present

## 2023-08-27 DIAGNOSIS — S52614D Nondisplaced fracture of right ulna styloid process, subsequent encounter for closed fracture with routine healing: Secondary | ICD-10-CM | POA: Diagnosis not present

## 2023-08-27 DIAGNOSIS — W19XXXD Unspecified fall, subsequent encounter: Secondary | ICD-10-CM | POA: Diagnosis not present

## 2023-08-27 DIAGNOSIS — E785 Hyperlipidemia, unspecified: Secondary | ICD-10-CM | POA: Diagnosis not present

## 2023-08-27 DIAGNOSIS — S52531D Colles' fracture of right radius, subsequent encounter for closed fracture with routine healing: Secondary | ICD-10-CM | POA: Diagnosis not present

## 2023-08-27 DIAGNOSIS — F419 Anxiety disorder, unspecified: Secondary | ICD-10-CM | POA: Diagnosis not present

## 2023-08-27 DIAGNOSIS — K7460 Unspecified cirrhosis of liver: Secondary | ICD-10-CM | POA: Diagnosis not present

## 2023-08-28 ENCOUNTER — Telehealth: Payer: Self-pay | Admitting: Physician Assistant

## 2023-08-28 ENCOUNTER — Other Ambulatory Visit: Payer: Self-pay

## 2023-08-28 DIAGNOSIS — K219 Gastro-esophageal reflux disease without esophagitis: Secondary | ICD-10-CM

## 2023-08-28 DIAGNOSIS — W19XXXD Unspecified fall, subsequent encounter: Secondary | ICD-10-CM

## 2023-08-28 DIAGNOSIS — Z9181 History of falling: Secondary | ICD-10-CM

## 2023-08-28 DIAGNOSIS — M1611 Unilateral primary osteoarthritis, right hip: Secondary | ICD-10-CM

## 2023-08-28 DIAGNOSIS — F419 Anxiety disorder, unspecified: Secondary | ICD-10-CM

## 2023-08-28 DIAGNOSIS — E785 Hyperlipidemia, unspecified: Secondary | ICD-10-CM

## 2023-08-28 DIAGNOSIS — F32A Depression, unspecified: Secondary | ICD-10-CM

## 2023-08-28 DIAGNOSIS — I2510 Atherosclerotic heart disease of native coronary artery without angina pectoris: Secondary | ICD-10-CM

## 2023-08-28 DIAGNOSIS — I10 Essential (primary) hypertension: Secondary | ICD-10-CM

## 2023-08-28 DIAGNOSIS — S32591D Other specified fracture of right pubis, subsequent encounter for fracture with routine healing: Secondary | ICD-10-CM

## 2023-08-28 DIAGNOSIS — S52531D Colles' fracture of right radius, subsequent encounter for closed fracture with routine healing: Secondary | ICD-10-CM

## 2023-08-28 DIAGNOSIS — K7460 Unspecified cirrhosis of liver: Secondary | ICD-10-CM

## 2023-08-28 DIAGNOSIS — S52614D Nondisplaced fracture of right ulna styloid process, subsequent encounter for closed fracture with routine healing: Secondary | ICD-10-CM

## 2023-08-28 MED ORDER — HYDROCHLOROTHIAZIDE 25 MG PO TABS: 25.0000 mg | ORAL_TABLET | Freq: Every day | ORAL | 1 refills | Status: DC

## 2023-08-28 NOTE — Telephone Encounter (Signed)
 Called patient to discuss scheduling office visit. Stated she is still unable to get around and travel due to injury. States it will be awhile before she is able to come in for office visit. Unsure when she will be able to.  Please advise, thank you.

## 2023-08-28 NOTE — Telephone Encounter (Signed)
 Noted, patient can reschedule at her convenience.

## 2023-08-28 NOTE — Telephone Encounter (Signed)
 [place], can you please call patient back to reschedule follow up appt? Thank you

## 2023-08-29 DIAGNOSIS — K219 Gastro-esophageal reflux disease without esophagitis: Secondary | ICD-10-CM | POA: Diagnosis not present

## 2023-08-29 DIAGNOSIS — I2510 Atherosclerotic heart disease of native coronary artery without angina pectoris: Secondary | ICD-10-CM | POA: Diagnosis not present

## 2023-08-29 DIAGNOSIS — K7460 Unspecified cirrhosis of liver: Secondary | ICD-10-CM | POA: Diagnosis not present

## 2023-08-29 DIAGNOSIS — Z9181 History of falling: Secondary | ICD-10-CM | POA: Diagnosis not present

## 2023-08-29 DIAGNOSIS — W19XXXD Unspecified fall, subsequent encounter: Secondary | ICD-10-CM | POA: Diagnosis not present

## 2023-08-29 DIAGNOSIS — F32A Depression, unspecified: Secondary | ICD-10-CM | POA: Diagnosis not present

## 2023-08-29 DIAGNOSIS — E785 Hyperlipidemia, unspecified: Secondary | ICD-10-CM | POA: Diagnosis not present

## 2023-08-29 DIAGNOSIS — S32591D Other specified fracture of right pubis, subsequent encounter for fracture with routine healing: Secondary | ICD-10-CM | POA: Diagnosis not present

## 2023-08-29 DIAGNOSIS — I10 Essential (primary) hypertension: Secondary | ICD-10-CM | POA: Diagnosis not present

## 2023-08-29 DIAGNOSIS — M1611 Unilateral primary osteoarthritis, right hip: Secondary | ICD-10-CM | POA: Diagnosis not present

## 2023-08-29 DIAGNOSIS — S52531D Colles' fracture of right radius, subsequent encounter for closed fracture with routine healing: Secondary | ICD-10-CM | POA: Diagnosis not present

## 2023-08-29 DIAGNOSIS — S52614D Nondisplaced fracture of right ulna styloid process, subsequent encounter for closed fracture with routine healing: Secondary | ICD-10-CM | POA: Diagnosis not present

## 2023-08-29 DIAGNOSIS — F419 Anxiety disorder, unspecified: Secondary | ICD-10-CM | POA: Diagnosis not present

## 2023-08-29 NOTE — Telephone Encounter (Signed)
 CLINICAL USE BELOW THIS LINE (use X to signify taken)  ____Form received and placed in providers office for signature. __X__Form completed and faxed to [facility]. ____Form completed & LVM to notify pt ready for pick up __X__Charge sheet & copy of form in front office folder for office supervisor.   Both sets of home health certifications placed in front office folder for office supervisor

## 2023-09-03 DIAGNOSIS — K7460 Unspecified cirrhosis of liver: Secondary | ICD-10-CM | POA: Diagnosis not present

## 2023-09-03 DIAGNOSIS — S52614D Nondisplaced fracture of right ulna styloid process, subsequent encounter for closed fracture with routine healing: Secondary | ICD-10-CM | POA: Diagnosis not present

## 2023-09-03 DIAGNOSIS — K219 Gastro-esophageal reflux disease without esophagitis: Secondary | ICD-10-CM | POA: Diagnosis not present

## 2023-09-03 DIAGNOSIS — F419 Anxiety disorder, unspecified: Secondary | ICD-10-CM | POA: Diagnosis not present

## 2023-09-03 DIAGNOSIS — I10 Essential (primary) hypertension: Secondary | ICD-10-CM | POA: Diagnosis not present

## 2023-09-03 DIAGNOSIS — M1611 Unilateral primary osteoarthritis, right hip: Secondary | ICD-10-CM | POA: Diagnosis not present

## 2023-09-03 DIAGNOSIS — W19XXXD Unspecified fall, subsequent encounter: Secondary | ICD-10-CM | POA: Diagnosis not present

## 2023-09-03 DIAGNOSIS — S52531D Colles' fracture of right radius, subsequent encounter for closed fracture with routine healing: Secondary | ICD-10-CM | POA: Diagnosis not present

## 2023-09-03 DIAGNOSIS — I2510 Atherosclerotic heart disease of native coronary artery without angina pectoris: Secondary | ICD-10-CM | POA: Diagnosis not present

## 2023-09-03 DIAGNOSIS — F32A Depression, unspecified: Secondary | ICD-10-CM | POA: Diagnosis not present

## 2023-09-03 DIAGNOSIS — Z9181 History of falling: Secondary | ICD-10-CM | POA: Diagnosis not present

## 2023-09-03 DIAGNOSIS — E785 Hyperlipidemia, unspecified: Secondary | ICD-10-CM | POA: Diagnosis not present

## 2023-09-03 DIAGNOSIS — S32591D Other specified fracture of right pubis, subsequent encounter for fracture with routine healing: Secondary | ICD-10-CM | POA: Diagnosis not present

## 2023-09-10 DIAGNOSIS — S52531D Colles' fracture of right radius, subsequent encounter for closed fracture with routine healing: Secondary | ICD-10-CM | POA: Diagnosis not present

## 2023-09-10 DIAGNOSIS — M1611 Unilateral primary osteoarthritis, right hip: Secondary | ICD-10-CM | POA: Diagnosis not present

## 2023-09-10 DIAGNOSIS — I10 Essential (primary) hypertension: Secondary | ICD-10-CM | POA: Diagnosis not present

## 2023-09-10 DIAGNOSIS — S52614D Nondisplaced fracture of right ulna styloid process, subsequent encounter for closed fracture with routine healing: Secondary | ICD-10-CM | POA: Diagnosis not present

## 2023-09-10 DIAGNOSIS — Z9181 History of falling: Secondary | ICD-10-CM | POA: Diagnosis not present

## 2023-09-10 DIAGNOSIS — K219 Gastro-esophageal reflux disease without esophagitis: Secondary | ICD-10-CM | POA: Diagnosis not present

## 2023-09-10 DIAGNOSIS — F32A Depression, unspecified: Secondary | ICD-10-CM | POA: Diagnosis not present

## 2023-09-10 DIAGNOSIS — E785 Hyperlipidemia, unspecified: Secondary | ICD-10-CM | POA: Diagnosis not present

## 2023-09-10 DIAGNOSIS — W19XXXD Unspecified fall, subsequent encounter: Secondary | ICD-10-CM | POA: Diagnosis not present

## 2023-09-10 DIAGNOSIS — K7460 Unspecified cirrhosis of liver: Secondary | ICD-10-CM | POA: Diagnosis not present

## 2023-09-10 DIAGNOSIS — S32591D Other specified fracture of right pubis, subsequent encounter for fracture with routine healing: Secondary | ICD-10-CM | POA: Diagnosis not present

## 2023-09-10 DIAGNOSIS — F419 Anxiety disorder, unspecified: Secondary | ICD-10-CM | POA: Diagnosis not present

## 2023-09-10 DIAGNOSIS — I2510 Atherosclerotic heart disease of native coronary artery without angina pectoris: Secondary | ICD-10-CM | POA: Diagnosis not present

## 2023-09-19 DIAGNOSIS — F419 Anxiety disorder, unspecified: Secondary | ICD-10-CM | POA: Diagnosis not present

## 2023-09-19 DIAGNOSIS — S32591D Other specified fracture of right pubis, subsequent encounter for fracture with routine healing: Secondary | ICD-10-CM | POA: Diagnosis not present

## 2023-09-19 DIAGNOSIS — S52531D Colles' fracture of right radius, subsequent encounter for closed fracture with routine healing: Secondary | ICD-10-CM | POA: Diagnosis not present

## 2023-09-19 DIAGNOSIS — I2510 Atherosclerotic heart disease of native coronary artery without angina pectoris: Secondary | ICD-10-CM | POA: Diagnosis not present

## 2023-09-19 DIAGNOSIS — K219 Gastro-esophageal reflux disease without esophagitis: Secondary | ICD-10-CM | POA: Diagnosis not present

## 2023-09-19 DIAGNOSIS — F32A Depression, unspecified: Secondary | ICD-10-CM | POA: Diagnosis not present

## 2023-09-19 DIAGNOSIS — M1611 Unilateral primary osteoarthritis, right hip: Secondary | ICD-10-CM | POA: Diagnosis not present

## 2023-09-19 DIAGNOSIS — W19XXXD Unspecified fall, subsequent encounter: Secondary | ICD-10-CM | POA: Diagnosis not present

## 2023-09-19 DIAGNOSIS — I10 Essential (primary) hypertension: Secondary | ICD-10-CM | POA: Diagnosis not present

## 2023-09-19 DIAGNOSIS — Z9181 History of falling: Secondary | ICD-10-CM | POA: Diagnosis not present

## 2023-09-19 DIAGNOSIS — E785 Hyperlipidemia, unspecified: Secondary | ICD-10-CM | POA: Diagnosis not present

## 2023-09-19 DIAGNOSIS — K7460 Unspecified cirrhosis of liver: Secondary | ICD-10-CM | POA: Diagnosis not present

## 2023-09-19 DIAGNOSIS — S52614D Nondisplaced fracture of right ulna styloid process, subsequent encounter for closed fracture with routine healing: Secondary | ICD-10-CM | POA: Diagnosis not present

## 2023-09-23 ENCOUNTER — Other Ambulatory Visit: Payer: Self-pay

## 2023-09-23 MED ORDER — LOSARTAN POTASSIUM 100 MG PO TABS: 100.0000 mg | ORAL_TABLET | Freq: Every day | ORAL | 3 refills | Status: AC

## 2023-09-24 DIAGNOSIS — S32591D Other specified fracture of right pubis, subsequent encounter for fracture with routine healing: Secondary | ICD-10-CM | POA: Diagnosis not present

## 2023-09-24 DIAGNOSIS — W19XXXD Unspecified fall, subsequent encounter: Secondary | ICD-10-CM | POA: Diagnosis not present

## 2023-09-24 DIAGNOSIS — F32A Depression, unspecified: Secondary | ICD-10-CM | POA: Diagnosis not present

## 2023-09-24 DIAGNOSIS — K219 Gastro-esophageal reflux disease without esophagitis: Secondary | ICD-10-CM | POA: Diagnosis not present

## 2023-09-24 DIAGNOSIS — F419 Anxiety disorder, unspecified: Secondary | ICD-10-CM | POA: Diagnosis not present

## 2023-09-24 DIAGNOSIS — I2510 Atherosclerotic heart disease of native coronary artery without angina pectoris: Secondary | ICD-10-CM | POA: Diagnosis not present

## 2023-09-24 DIAGNOSIS — Z9181 History of falling: Secondary | ICD-10-CM | POA: Diagnosis not present

## 2023-09-24 DIAGNOSIS — M1611 Unilateral primary osteoarthritis, right hip: Secondary | ICD-10-CM | POA: Diagnosis not present

## 2023-09-24 DIAGNOSIS — K7460 Unspecified cirrhosis of liver: Secondary | ICD-10-CM | POA: Diagnosis not present

## 2023-09-24 DIAGNOSIS — S52531D Colles' fracture of right radius, subsequent encounter for closed fracture with routine healing: Secondary | ICD-10-CM | POA: Diagnosis not present

## 2023-09-24 DIAGNOSIS — S52614D Nondisplaced fracture of right ulna styloid process, subsequent encounter for closed fracture with routine healing: Secondary | ICD-10-CM | POA: Diagnosis not present

## 2023-09-24 DIAGNOSIS — I10 Essential (primary) hypertension: Secondary | ICD-10-CM | POA: Diagnosis not present

## 2023-09-24 DIAGNOSIS — E785 Hyperlipidemia, unspecified: Secondary | ICD-10-CM | POA: Diagnosis not present

## 2023-09-25 DIAGNOSIS — K219 Gastro-esophageal reflux disease without esophagitis: Secondary | ICD-10-CM | POA: Diagnosis not present

## 2023-09-25 DIAGNOSIS — W19XXXD Unspecified fall, subsequent encounter: Secondary | ICD-10-CM | POA: Diagnosis not present

## 2023-09-25 DIAGNOSIS — S32591D Other specified fracture of right pubis, subsequent encounter for fracture with routine healing: Secondary | ICD-10-CM | POA: Diagnosis not present

## 2023-09-25 DIAGNOSIS — Z9181 History of falling: Secondary | ICD-10-CM | POA: Diagnosis not present

## 2023-09-25 DIAGNOSIS — F32A Depression, unspecified: Secondary | ICD-10-CM | POA: Diagnosis not present

## 2023-09-25 DIAGNOSIS — M1611 Unilateral primary osteoarthritis, right hip: Secondary | ICD-10-CM | POA: Diagnosis not present

## 2023-09-25 DIAGNOSIS — I10 Essential (primary) hypertension: Secondary | ICD-10-CM | POA: Diagnosis not present

## 2023-09-25 DIAGNOSIS — K7460 Unspecified cirrhosis of liver: Secondary | ICD-10-CM | POA: Diagnosis not present

## 2023-09-25 DIAGNOSIS — I2510 Atherosclerotic heart disease of native coronary artery without angina pectoris: Secondary | ICD-10-CM | POA: Diagnosis not present

## 2023-09-25 DIAGNOSIS — F419 Anxiety disorder, unspecified: Secondary | ICD-10-CM | POA: Diagnosis not present

## 2023-09-25 DIAGNOSIS — S52614D Nondisplaced fracture of right ulna styloid process, subsequent encounter for closed fracture with routine healing: Secondary | ICD-10-CM | POA: Diagnosis not present

## 2023-09-25 DIAGNOSIS — S52531D Colles' fracture of right radius, subsequent encounter for closed fracture with routine healing: Secondary | ICD-10-CM | POA: Diagnosis not present

## 2023-09-25 DIAGNOSIS — E785 Hyperlipidemia, unspecified: Secondary | ICD-10-CM | POA: Diagnosis not present

## 2023-10-01 DIAGNOSIS — S52501D Unspecified fracture of the lower end of right radius, subsequent encounter for closed fracture with routine healing: Secondary | ICD-10-CM | POA: Diagnosis not present

## 2023-10-01 DIAGNOSIS — I714 Abdominal aortic aneurysm, without rupture, unspecified: Secondary | ICD-10-CM | POA: Diagnosis not present

## 2023-10-01 DIAGNOSIS — M25531 Pain in right wrist: Secondary | ICD-10-CM | POA: Diagnosis not present

## 2023-10-01 DIAGNOSIS — S329XXK Fracture of unspecified parts of lumbosacral spine and pelvis, subsequent encounter for fracture with nonunion: Secondary | ICD-10-CM | POA: Diagnosis not present

## 2023-10-01 DIAGNOSIS — G629 Polyneuropathy, unspecified: Secondary | ICD-10-CM | POA: Diagnosis not present

## 2023-10-01 DIAGNOSIS — S52601D Unspecified fracture of lower end of right ulna, subsequent encounter for closed fracture with routine healing: Secondary | ICD-10-CM | POA: Diagnosis not present

## 2023-10-01 DIAGNOSIS — N1330 Unspecified hydronephrosis: Secondary | ICD-10-CM | POA: Diagnosis not present

## 2023-10-01 DIAGNOSIS — K8020 Calculus of gallbladder without cholecystitis without obstruction: Secondary | ICD-10-CM | POA: Diagnosis not present

## 2023-10-01 DIAGNOSIS — E869 Volume depletion, unspecified: Secondary | ICD-10-CM | POA: Diagnosis not present

## 2023-10-01 DIAGNOSIS — K219 Gastro-esophageal reflux disease without esophagitis: Secondary | ICD-10-CM | POA: Diagnosis not present

## 2023-10-01 DIAGNOSIS — Z7982 Long term (current) use of aspirin: Secondary | ICD-10-CM | POA: Diagnosis not present

## 2023-10-01 DIAGNOSIS — M25551 Pain in right hip: Secondary | ICD-10-CM | POA: Diagnosis not present

## 2023-10-01 DIAGNOSIS — R300 Dysuria: Secondary | ICD-10-CM | POA: Diagnosis not present

## 2023-10-01 DIAGNOSIS — K838 Other specified diseases of biliary tract: Secondary | ICD-10-CM | POA: Diagnosis not present

## 2023-10-01 DIAGNOSIS — N3000 Acute cystitis without hematuria: Secondary | ICD-10-CM | POA: Diagnosis not present

## 2023-10-01 DIAGNOSIS — N3001 Acute cystitis with hematuria: Secondary | ICD-10-CM | POA: Diagnosis not present

## 2023-10-01 DIAGNOSIS — Z7901 Long term (current) use of anticoagulants: Secondary | ICD-10-CM | POA: Diagnosis not present

## 2023-10-01 DIAGNOSIS — K760 Fatty (change of) liver, not elsewhere classified: Secondary | ICD-10-CM | POA: Diagnosis not present

## 2023-10-01 DIAGNOSIS — E876 Hypokalemia: Secondary | ICD-10-CM | POA: Diagnosis not present

## 2023-10-01 DIAGNOSIS — E871 Hypo-osmolality and hyponatremia: Secondary | ICD-10-CM | POA: Diagnosis not present

## 2023-10-01 DIAGNOSIS — M5416 Radiculopathy, lumbar region: Secondary | ICD-10-CM | POA: Diagnosis not present

## 2023-10-01 DIAGNOSIS — I10 Essential (primary) hypertension: Secondary | ICD-10-CM | POA: Diagnosis not present

## 2023-10-02 ENCOUNTER — Ambulatory Visit: Admitting: Internal Medicine

## 2023-10-03 ENCOUNTER — Telehealth: Payer: Self-pay

## 2023-10-03 DIAGNOSIS — I10 Essential (primary) hypertension: Secondary | ICD-10-CM | POA: Diagnosis not present

## 2023-10-03 DIAGNOSIS — Z9181 History of falling: Secondary | ICD-10-CM | POA: Diagnosis not present

## 2023-10-03 DIAGNOSIS — K219 Gastro-esophageal reflux disease without esophagitis: Secondary | ICD-10-CM | POA: Diagnosis not present

## 2023-10-03 DIAGNOSIS — S52614D Nondisplaced fracture of right ulna styloid process, subsequent encounter for closed fracture with routine healing: Secondary | ICD-10-CM | POA: Diagnosis not present

## 2023-10-03 DIAGNOSIS — S52531D Colles' fracture of right radius, subsequent encounter for closed fracture with routine healing: Secondary | ICD-10-CM | POA: Diagnosis not present

## 2023-10-03 DIAGNOSIS — I2510 Atherosclerotic heart disease of native coronary artery without angina pectoris: Secondary | ICD-10-CM | POA: Diagnosis not present

## 2023-10-03 DIAGNOSIS — F32A Depression, unspecified: Secondary | ICD-10-CM | POA: Diagnosis not present

## 2023-10-03 DIAGNOSIS — K7460 Unspecified cirrhosis of liver: Secondary | ICD-10-CM | POA: Diagnosis not present

## 2023-10-03 DIAGNOSIS — F419 Anxiety disorder, unspecified: Secondary | ICD-10-CM | POA: Diagnosis not present

## 2023-10-03 DIAGNOSIS — M1611 Unilateral primary osteoarthritis, right hip: Secondary | ICD-10-CM | POA: Diagnosis not present

## 2023-10-03 DIAGNOSIS — E785 Hyperlipidemia, unspecified: Secondary | ICD-10-CM | POA: Diagnosis not present

## 2023-10-03 DIAGNOSIS — S32591D Other specified fracture of right pubis, subsequent encounter for fracture with routine healing: Secondary | ICD-10-CM | POA: Diagnosis not present

## 2023-10-03 DIAGNOSIS — W19XXXD Unspecified fall, subsequent encounter: Secondary | ICD-10-CM | POA: Diagnosis not present

## 2023-10-03 NOTE — Transitions of Care (Post Inpatient/ED Visit) (Unsigned)
   10/03/2023  Name: Amanda [name] MRN: [id number] DOB: 04-Jul-1945  Today's TOC FU Call Status: Today's TOC FU Call Status:: Unsuccessful Call (1st Attempt) Unsuccessful Call (1st Attempt) Date: 10/03/23  Attempted to reach the patient regarding the most recent Inpatient/ED visit.  Follow Up Plan: Additional outreach attempts will be made to reach the patient to complete the Transitions of Care (Post Inpatient/ED visit) call.   Signature Julian Lemmings, LPN [facility] Nurse Health Advisor Direct Dial [phone]

## 2023-10-03 NOTE — Progress Notes (Signed)
 [facility]ARE [facility] [address] [place] [address] Dept: [phone] Dept Fax: [phone]    Subjective:   Amanda [name] Nov 02, 1945 10/04/2023  Chief Complaint  Patient presents with   Hospitalization Follow-up    Discuss bone density Handicap Placard     HPI:  Discussed the use of AI scribe software for clinical note transcription with the patient, who gave verbal consent to proceed. Amanda [name] is a 78 year old female who presents for hospital follow-up.  She was admitted to [facility] on 10/01/2023 and discharged on 10/02/2023 for acute cystitis with hematuria and hyponatremia.  She was started on IV fluids and antibiotics.  She was given cefdinir 300 mg p.o. twice daily x 2 days upon discharge.  She has completed these antibiotics.  She was hypotensive during hospitalization, HCTZ was held.  She does continue her losartan  100 mg p.o. daily.  During the hospitalization she was found to have an aneurysm of her abdominal aorta measuring 3.1 cm.  On abdominal ultrasound in November 2024 she was found to have a distal abdominal aorta measuring 2.8 cm, with recommendations of follow-up ultrasound every 5 years.  She is followed by cardiology for a enlarged ascending aorta of 4.2 cm -last transthoracic echo was May 2024.  Prior to hospitalization, patient had experienced severe burning with urination that began that morning of.  She also experienced chills, body aches, and nausea.  She states she now only has a slight burning sensation that occurred yesterday, but none today.  Patient is also under the care of of orthopedics for a closed fracture of the right distal radius and ulna due to a fall in May 2025. They are using a cane for stability and have not had further falls.  Patient's daughter has been assisting with household tasks and care.  She does have a history of osteopenia.  She is due for a  bone density scan.      The following portions of the patient's history were reviewed and updated as appropriate: past medical history, past surgical history, family history, social history, allergies, medications, and problem list.   Patient Active Problem List   Diagnosis Date Noted   Mild aortic insufficiency by transthoracic echocardiogram last evaluated May 2024.  Valve anatomy reported as bicuspid however noted as normal on prior studies 02/13/2023   Mild ascending aorta dilatation (HCC), 4.2 cm by transthoracic echocardiogram May 2024.  Was similar in size on prior CT coronary imaging from January 2020 02/13/2023   Chest discomfort, atypical appears noncardiac and possibly related to hiatal hernia 02/13/2023   Infrarenal abdominal aortic aneurysm (AAA) without rupture (HCC) 09/17/2022   Hypoglycemia 08/27/2022   Pain in toes of both feet 03/29/2022   Shoulder pain, left 08/31/2021   Pituitary adenoma (HCC) 03/31/2021   Pituitary mass (HCC) 02/14/2021   Mixed stress and urge urinary incontinence 11/29/2020   Sensorineural hearing loss (SNHL) of both ears 10/13/2020   Hyperlipidemia 08/02/2020   PVD (peripheral vascular disease) (HCC) 08/01/2020   Peripheral arterial disease (HCC) 07/26/2020   Basal cell carcinoma (BCC) of right lower leg 06/06/2020   Major depressive disorder with single episode, in full remission (HCC) 01/22/2020   Statin intolerance 01/26/2019   Insomnia due to other mental disorder 12/30/2018   Basal cell carcinoma (BCC) of left ala nasi 09/12/2018   Lumbar spondylosis 12/26/2017   Degeneration of lumbar intervertebral disc 07/09/2017   Lumbar post-laminectomy syndrome 07/09/2017   Pain in  joint of right shoulder 07/09/2017   Nonintractable episodic headache 05/20/2017   Herniated lumbar intervertebral disc 11/07/2016   Chronic right sacroiliac joint pain 06/08/2016   Chronic right-sided low back pain with right-sided sciatica 06/08/2016   Trochanteric  bursitis of right hip 03/14/2016   Impingement syndrome of right shoulder 03/08/2016   Iliotibial band syndrome of right side 03/08/2016   Right rotator cuff tendonitis 03/08/2016   Anxiety 08/29/2015   Osteoporosis 08/29/2015   Hypocalcemia 08/29/2015   CAD, cath February 05 1569% small nondominant RCA lesion medically treated; CT coronary angio January 2020 calcium  score 53, mild proximal LAD disease 04/01/2014   Abnormal stress echocardiogram 01/29/2014   Dyspnea 01/04/2014   Essential hypertension    Melanoma (HCC)    Stricture of esophagus    Diverticulosis    Vitamin D  deficiency    Hiatal hernia    Hemorrhoids    Arthritis    GERD (gastroesophageal reflux disease)    Osteopenia    Back pain 02/05/2011   Family history of malignant neoplasm of gastrointestinal tract 08/21/2010   Dysphagia 08/21/2010   ESOPHAGEAL REFLUX 01/11/2009   Past Medical History:  Diagnosis Date   Anxiety    Arthritis    Back pain    CAD (coronary artery disease)    a. 01/2014: cath showing 70% stenosis of non-dominant RCA --> medically managed.    Cataract    bilat removed   Chest pain 07/22/2014   DDD (degenerative disc disease), cervical    DDD (degenerative disc disease), lumbar    Depression    Diverticulosis    GERD (gastroesophageal reflux disease)    Headache    Hemorrhoids    Hiatal hernia    Hyperlipidemia    Hypertension    Melanoma (HCC)    Orbital fracture (HCC)    right   Osteopenia 07/2015   T score -2.0 FRAX 10%/1.4%   Osteoporosis 08/29/2015   Pituitary tumor    Stricture of esophagus    Vitamin D  deficiency    Past Surgical History:  Procedure Laterality Date   ABDOMINAL AORTOGRAM W/LOWER EXTREMITY N/A 08/01/2020   Procedure: ABDOMINAL AORTOGRAM W/LOWER EXTREMITY;  Surgeon: Court Dorn PARAS, MD;  Location: [facility] INVASIVE [facility];  Service: Cardiovascular;  Laterality: N/A;   CERVICAL DISC SURGERY     x 2   COLONOSCOPY     EXCISION OF SKIN CANCER     Melanoma    FOOT SURGERY Right    HEMORRHOID BANDING     X3   INCONTINENCE SURGERY  12/2020   LEFT HEART CATHETERIZATION WITH CORONARY ANGIOGRAM N/A 02/03/2014   Procedure: LEFT HEART CATHETERIZATION WITH CORONARY ANGIOGRAM;  Surgeon: Ozell JONETTA Fell, MD;  Location: [facility] CATH LAB;  Service: Cardiovascular;  Laterality: N/A;   LUMBAR DISC SURGERY  2018   OOPHORECTOMY  2012   BSO   PELVIC LAPAROSCOPY  2012   Diag Lap-BSO-lysis of adhesions   PERIPHERAL VASCULAR INTERVENTION Left 08/01/2020   Procedure: PERIPHERAL VASCULAR INTERVENTION;  Surgeon: Court Dorn PARAS, MD;  Location: [facility] INVASIVE CV LAB;  Service: Cardiovascular;  Laterality: Left;   TUBAL LIGATION     VAGINAL HYSTERECTOMY  1993   Family History  Problem Relation Age of Onset   Heart disease Mother    Hypertension Mother    Stroke Mother    Colon polyps Father    Heart disease Father        CHF   Hypertension Father    Cancer Father  COLON   Colon cancer Father    Colon cancer Sister    Colon polyps Sister    Heart disease Sister    Diabetes Sister    Melanoma Sister    Heart disease Sister    Heart disease Sister    Colon polyps Brother    Diabetes Brother    Heart disease Brother    Cancer Brother        COLON   Melanoma Brother    Colon cancer Brother    Melanoma Brother    Skin cancer Brother    Skin cancer Brother    Prostate cancer Brother    Skin cancer Daughter    Hypertension Daughter    Skin cancer Daughter    Migraines Daughter    Skin cancer Son    Esophageal cancer Neg Hx    Rectal cancer Neg Hx    Stomach cancer Neg Hx    Breast cancer Neg Hx     Current Outpatient Medications:    aspirin  EC 81 MG tablet, Take 81 mg by mouth daily. Swallow whole., Disp: , Rfl:    Cholecalciferol (VITAMIN D3) 50 MCG (2000 UT) TABS, Take 2 tablets by mouth daily., Disp: , Rfl:    HYDROcodone-acetaminophen  (NORCO) 10-325 MG tablet, Take 1 tablet by mouth every 6 (six) hours as needed (Back pain)., Disp: ,  Rfl:    LORazepam  (ATIVAN ) 0.5 MG tablet, TAKE 1 TO 2 TABLETS BY MOUTH ONCE DAILY AS NEEDED FOR ANXIETY, Disp: 60 tablet, Rfl: 2   losartan  (COZAAR ) 100 MG tablet, Take 1 tablet (100 mg total) by mouth at bedtime., Disp: 90 tablet, Rfl: 3   MAGNESIUM  CITRATE PO, Take 1 tablet by mouth daily., Disp: , Rfl:    methocarbamol  (ROBAXIN ) 500 MG tablet, Take 1 tablet (500 mg total) by mouth every 8 (eight) hours as needed for muscle spasms., Disp: 30 tablet, Rfl: 0   pantoprazole  (PROTONIX ) 40 MG tablet, Take 1 tablet (40 mg total) by mouth 2 (two) times daily before a meal., Disp: 180 tablet, Rfl: 0   rosuvastatin  (CRESTOR ) 5 MG tablet, Take 0.5 tablets (2.5 mg total) by mouth every other day., Disp: 45 tablet, Rfl: 3   spironolactone  (ALDACTONE ) 25 MG tablet, Take 25 mg by mouth daily as needed (elevated BP)., Disp: , Rfl:    AMBULATORY NON FORMULARY MEDICATION, Take 2 tablets by mouth every other day as needed (constipation). TAM herbal laxative Take 2 tablet by mouth every other day as needed, Disp: , Rfl:    diclofenac  Sodium (VOLTAREN ) 1 % GEL, Apply 2-4 g topically as needed (Joint pain)., Disp: , Rfl:    fluticasone (FLONASE) 50 MCG/ACT nasal spray, Place 1 spray into both nostrils daily as needed for allergies., Disp: , Rfl:    hydrochlorothiazide  (HYDRODIURIL ) 25 MG tablet, Take 1 tablet (25 mg total) by mouth daily. (Patient not taking: Reported on 10/04/2023), Disp: 90 tablet, Rfl: 1 Allergies  Allergen Reactions   Ciprofloxacin Rash   Lisinopril Swelling   Pneumococcal Vaccine Swelling   Pravastatin  Other (See Comments)    Muscle aches    Crestor  [Rosuvastatin ]     myalgias   Evolocumab  Other (See Comments)    cramping   Lipitor [Atorvastatin]     myalgias   Lubiprostone      Vomiting    Norvasc  [Amlodipine ]     swelling   Oxycodone  Other (See Comments)   Zetia  [Ezetimibe ]     felt horrible     ROS: A  complete ROS was performed with pertinent positives/negatives noted in the  HPI. The remainder of the ROS are negative.    Objective:   Today's Vitals   10/04/23 1034  BP: (!) 140/82  Pulse: 73  Temp: 97.9 F (36.6 C)  TempSrc: Temporal  SpO2: 98%  Weight: 162 lb (73.5 kg)  Height: 5' 5 (1.651 m)    GENERAL: Well-appearing, in NAD. Well nourished.  SKIN: Pink, warm and dry. No rash, lesion, ulceration, or ecchymoses.  NECK: Trachea midline. Full ROM w/o pain or tenderness. No lymphadenopathy.  RESPIRATORY: Chest wall symmetrical. Respirations even and non-labored. Breath sounds clear to auscultation bilaterally.  CARDIAC: S1, S2 present, regular rate and rhythm. Peripheral pulses 2+ bilaterally.  EXTREMITIES: Without clubbing, cyanosis, or edema.  NEUROLOGIC: Steady, even gait with walker assistance.  PSYCH/MENTAL STATUS: Alert, oriented x 3. Cooperative, appropriate mood and affect.   Health Maintenance Due  Topic Date Due   Hepatitis B Vaccines (1 of 3 - Risk 3-dose series) Never done   Medicare Annual Wellness (AWV)  10/26/2023    No results found for any visits on 10/04/23.  The 10-year ASCVD risk score (Arnett DK, et al., 2019) is: 24.8%     Assessment & Plan:  Assessment and Plan    Acute Cystitis with Hematuria Acute UTI treated with cefdinir, symptoms improved. - Recheck urine sample to ensure resolution. - Contact with urine test results.  Hyponatremia Mild hyponatremia likely due to dehydration. - Recheck sodium level today.  Hypotension with history of Hypertension Blood pressure 140/82. Hydrochlorothiazide  held during hospital stay. Consider medication adjustment if elevated. - Continue losartan  100 mg at bedtime. - Hold hydrochlorothiazide . - Monitor blood pressure at home; contact primary care if consistently above 140/80.  Abdominal Aortic Aneurysm 3.1 cm aneurysm requires routine monitoring. No surgical intervention unless it increases to 5-5.5 cm. - Continue routine ultrasounds for monitoring. - Ensure blood pressure  is well-controlled.  Osteopenia Osteopenia with recent fracture. Last bone density scan in 2017. - Order bone density scan. - Schedule scan at preferred location.  Closed fracture of right distal radius and ulna  - continue management with orthopedics   History of Falling  - continue cane for gait assistance       Orders Placed This Encounter  Procedures   DG Bone Density    Standing Status:   Future    Expiration Date:   10/03/2024    Reason for Exam (SYMPTOM  OR DIAGNOSIS REQUIRED):   osteopenia , recent fracture    Preferred imaging location?:   [facility] [address]   Basic Metabolic Panel (BMET)   Urinalysis w microscopic + reflex cultur   No images are attached to the encounter or orders placed in the encounter. No orders of the defined types were placed in this encounter.   Return in about 3 months (around 01/04/2024) for Chronic Condition follow up.   Rosina Senters, FNP

## 2023-10-04 ENCOUNTER — Ambulatory Visit: Admitting: Internal Medicine

## 2023-10-04 ENCOUNTER — Encounter: Payer: Self-pay | Admitting: Internal Medicine

## 2023-10-04 VITALS — BP 140/82 | HR 73 | Temp 97.9°F | Ht 65.0 in | Wt 162.0 lb

## 2023-10-04 DIAGNOSIS — Z9181 History of falling: Secondary | ICD-10-CM

## 2023-10-04 DIAGNOSIS — S52501D Unspecified fracture of the lower end of right radius, subsequent encounter for closed fracture with routine healing: Secondary | ICD-10-CM

## 2023-10-04 DIAGNOSIS — M8580 Other specified disorders of bone density and structure, unspecified site: Secondary | ICD-10-CM

## 2023-10-04 DIAGNOSIS — S52601D Unspecified fracture of lower end of right ulna, subsequent encounter for closed fracture with routine healing: Secondary | ICD-10-CM

## 2023-10-04 DIAGNOSIS — I959 Hypotension, unspecified: Secondary | ICD-10-CM

## 2023-10-04 DIAGNOSIS — N3001 Acute cystitis with hematuria: Secondary | ICD-10-CM

## 2023-10-04 DIAGNOSIS — I714 Abdominal aortic aneurysm, without rupture, unspecified: Secondary | ICD-10-CM

## 2023-10-04 DIAGNOSIS — E871 Hypo-osmolality and hyponatremia: Secondary | ICD-10-CM | POA: Diagnosis not present

## 2023-10-04 LAB — BASIC METABOLIC PANEL WITH GFR
BUN: 11 mg/dL (ref 6–23)
CO2: 28 meq/L (ref 19–32)
Calcium: 8.5 mg/dL (ref 8.4–10.5)
Chloride: 102 meq/L (ref 96–112)
Creatinine, Ser: 0.83 mg/dL (ref 0.40–1.20)
GFR: 68.82 mL/min (ref >60.00)
Glucose, Bld: 96 mg/dL (ref 70–99)
Potassium: 4 meq/L (ref 3.5–5.1)
Sodium: 137 meq/L (ref 135–145)

## 2023-10-04 NOTE — Patient Instructions (Signed)
 Keep check on your blood pressure. If 140/80 or higher consistently, then call me.  - at this time, continue your losartan . HOLD hydrochlorothiazide .    Go for bone density scan - they will call to schedule you.

## 2023-10-04 NOTE — Transitions of Care (Post Inpatient/ED Visit) (Signed)
   10/04/2023  Name: Amanda [name] MRN: [id number] DOB: 22-Apr-1945  Today's TOC FU Call Status: Today's TOC FU Call Status:: Unsuccessful Call (1st Attempt) Unsuccessful Call (1st Attempt) Date: 10/03/23  Attempted to reach the patient regarding the most recent Inpatient/ED visit.  Follow Up Plan: No further outreach attempts will be made at this time. We have been unable to contact the patient. Patient already seen in office Signature Julian Lemmings, LPN [facility] Nurse Health Advisor Direct Dial [phone]

## 2023-10-05 LAB — URINALYSIS W MICROSCOPIC + REFLEX CULTURE
Bacteria, UA: NONE SEEN /HPF
Bilirubin Urine: NEGATIVE
Glucose, UA: NEGATIVE
Hgb urine dipstick: NEGATIVE
Hyaline Cast: NONE SEEN /LPF
Ketones, ur: NEGATIVE
Leukocyte Esterase: NEGATIVE
Nitrites, Initial: NEGATIVE
Protein, ur: NEGATIVE
RBC / HPF: NONE SEEN /HPF (ref 0–2)
Specific Gravity, Urine: 1.009 (ref 1.001–1.035)
WBC, UA: NONE SEEN /HPF (ref 0–5)
pH: 8 (ref 5.0–8.0)

## 2023-10-05 LAB — NO CULTURE INDICATED

## 2023-10-07 ENCOUNTER — Telehealth: Payer: Self-pay

## 2023-10-07 DIAGNOSIS — S52531D Colles' fracture of right radius, subsequent encounter for closed fracture with routine healing: Secondary | ICD-10-CM | POA: Diagnosis not present

## 2023-10-07 DIAGNOSIS — I2510 Atherosclerotic heart disease of native coronary artery without angina pectoris: Secondary | ICD-10-CM | POA: Diagnosis not present

## 2023-10-07 DIAGNOSIS — I10 Essential (primary) hypertension: Secondary | ICD-10-CM | POA: Diagnosis not present

## 2023-10-07 DIAGNOSIS — F419 Anxiety disorder, unspecified: Secondary | ICD-10-CM | POA: Diagnosis not present

## 2023-10-07 DIAGNOSIS — S32591D Other specified fracture of right pubis, subsequent encounter for fracture with routine healing: Secondary | ICD-10-CM | POA: Diagnosis not present

## 2023-10-07 DIAGNOSIS — E785 Hyperlipidemia, unspecified: Secondary | ICD-10-CM | POA: Diagnosis not present

## 2023-10-07 DIAGNOSIS — W19XXXD Unspecified fall, subsequent encounter: Secondary | ICD-10-CM | POA: Diagnosis not present

## 2023-10-07 DIAGNOSIS — K7460 Unspecified cirrhosis of liver: Secondary | ICD-10-CM | POA: Diagnosis not present

## 2023-10-07 DIAGNOSIS — M1611 Unilateral primary osteoarthritis, right hip: Secondary | ICD-10-CM | POA: Diagnosis not present

## 2023-10-07 DIAGNOSIS — S52614D Nondisplaced fracture of right ulna styloid process, subsequent encounter for closed fracture with routine healing: Secondary | ICD-10-CM | POA: Diagnosis not present

## 2023-10-07 DIAGNOSIS — Z9181 History of falling: Secondary | ICD-10-CM | POA: Diagnosis not present

## 2023-10-07 DIAGNOSIS — K219 Gastro-esophageal reflux disease without esophagitis: Secondary | ICD-10-CM | POA: Diagnosis not present

## 2023-10-07 DIAGNOSIS — F32A Depression, unspecified: Secondary | ICD-10-CM | POA: Diagnosis not present

## 2023-10-07 NOTE — Telephone Encounter (Signed)
 Please advise  Copied from CRM [id number]. Topic: Clinical - Home Health Verbal Orders >> Oct 07, 2023  1:34 PM Aleatha BROCKS wrote: Caller/Agency: Lynden Rushing Number: [phone] Service Requested: Physical Therapy Frequency: 1 week 4 Any new concerns about the patient? Yes patient will need occupational to work on right wrist  and still wearing sprint

## 2023-10-08 ENCOUNTER — Ambulatory Visit: Payer: Self-pay | Admitting: Internal Medicine

## 2023-10-08 DIAGNOSIS — M1611 Unilateral primary osteoarthritis, right hip: Secondary | ICD-10-CM | POA: Diagnosis not present

## 2023-10-08 DIAGNOSIS — F32A Depression, unspecified: Secondary | ICD-10-CM | POA: Diagnosis not present

## 2023-10-08 DIAGNOSIS — Z9181 History of falling: Secondary | ICD-10-CM | POA: Diagnosis not present

## 2023-10-08 DIAGNOSIS — S52531D Colles' fracture of right radius, subsequent encounter for closed fracture with routine healing: Secondary | ICD-10-CM | POA: Diagnosis not present

## 2023-10-08 DIAGNOSIS — E785 Hyperlipidemia, unspecified: Secondary | ICD-10-CM | POA: Diagnosis not present

## 2023-10-08 DIAGNOSIS — S52614D Nondisplaced fracture of right ulna styloid process, subsequent encounter for closed fracture with routine healing: Secondary | ICD-10-CM | POA: Diagnosis not present

## 2023-10-08 DIAGNOSIS — I10 Essential (primary) hypertension: Secondary | ICD-10-CM | POA: Diagnosis not present

## 2023-10-08 DIAGNOSIS — F419 Anxiety disorder, unspecified: Secondary | ICD-10-CM | POA: Diagnosis not present

## 2023-10-08 DIAGNOSIS — K219 Gastro-esophageal reflux disease without esophagitis: Secondary | ICD-10-CM | POA: Diagnosis not present

## 2023-10-08 DIAGNOSIS — I2510 Atherosclerotic heart disease of native coronary artery without angina pectoris: Secondary | ICD-10-CM | POA: Diagnosis not present

## 2023-10-08 DIAGNOSIS — K7460 Unspecified cirrhosis of liver: Secondary | ICD-10-CM | POA: Diagnosis not present

## 2023-10-08 DIAGNOSIS — W19XXXD Unspecified fall, subsequent encounter: Secondary | ICD-10-CM | POA: Diagnosis not present

## 2023-10-08 DIAGNOSIS — S32591D Other specified fracture of right pubis, subsequent encounter for fracture with routine healing: Secondary | ICD-10-CM | POA: Diagnosis not present

## 2023-10-08 NOTE — Telephone Encounter (Signed)
 Okay for Verbal Orders. Physical therapy 1 time a week for 4 weeks. Also okay to proceed with Occupational Therapy.

## 2023-10-09 ENCOUNTER — Encounter: Payer: Self-pay | Admitting: Internal Medicine

## 2023-10-09 DIAGNOSIS — S52501A Unspecified fracture of the lower end of right radius, initial encounter for closed fracture: Secondary | ICD-10-CM | POA: Insufficient documentation

## 2023-10-09 DIAGNOSIS — I714 Abdominal aortic aneurysm, without rupture, unspecified: Secondary | ICD-10-CM | POA: Insufficient documentation

## 2023-10-09 HISTORY — DX: Abdominal aortic aneurysm, without rupture, unspecified: I71.40

## 2023-10-09 HISTORY — DX: Unspecified fracture of the lower end of right radius, initial encounter for closed fracture: S52.501A

## 2023-10-10 ENCOUNTER — Telehealth: Payer: Self-pay

## 2023-10-10 NOTE — Progress Notes (Signed)
   10/10/2023  Patient ID: Amanda [name] Patient, female   DOB: [birth year], 78 y.o.   MRN: [id number]  This patient is appearing on a report for being at risk of failing the adherence measure for cholesterol (statin) medications this calendar year.   Medication: rosuvastatin  5mg  Last fill date: 05/13/2023 for 90 day supply  Last fill billed incorrectly based on directions to take one-half rosuvastatin  5mg  every other day- #45 would be a 180 day supply.  Amanda [name], PharmD, DPLA

## 2023-10-10 NOTE — Telephone Encounter (Signed)
 Left voicemail asking patient to return call at [phone].

## 2023-10-11 NOTE — Telephone Encounter (Signed)
 Left voicemail asking patient to return call at [phone].

## 2023-10-14 ENCOUNTER — Ambulatory Visit: Admitting: Internal Medicine

## 2023-10-14 ENCOUNTER — Encounter: Payer: Self-pay | Admitting: Internal Medicine

## 2023-10-14 ENCOUNTER — Ambulatory Visit (INDEPENDENT_AMBULATORY_CARE_PROVIDER_SITE_OTHER): Admitting: Internal Medicine

## 2023-10-14 VITALS — BP 124/72 | HR 89 | Temp 98.4°F | Ht 65.0 in | Wt 158.6 lb

## 2023-10-14 DIAGNOSIS — N390 Urinary tract infection, site not specified: Secondary | ICD-10-CM

## 2023-10-14 DIAGNOSIS — I10 Essential (primary) hypertension: Secondary | ICD-10-CM

## 2023-10-14 LAB — POC URINALSYSI DIPSTICK (AUTOMATED)
Bilirubin, UA: POSITIVE
Blood, UA: NEGATIVE
Glucose, UA: NEGATIVE
Ketones, UA: NEGATIVE
Nitrite, UA: POSITIVE
Protein, UA: NEGATIVE
Spec Grav, UA: 1.015 (ref 1.010–1.025)
Urobilinogen, UA: 0.2 U/dL
pH, UA: 6 (ref 5.0–8.0)

## 2023-10-14 LAB — BASIC METABOLIC PANEL WITH GFR
BUN: 8 mg/dL (ref 6–23)
CO2: 30 meq/L (ref 19–32)
Calcium: 8.8 mg/dL (ref 8.4–10.5)
Chloride: 97 meq/L (ref 96–112)
Creatinine, Ser: 0.92 mg/dL (ref 0.40–1.20)
GFR: 60.81 mL/min (ref >60.00)
Glucose, Bld: 150 mg/dL — ABNORMAL HIGH (ref 70–99)
Potassium: 3.7 meq/L (ref 3.5–5.1)
Sodium: 134 meq/L — ABNORMAL LOW (ref 135–145)

## 2023-10-14 MED ORDER — CEFTRIAXONE SODIUM 1 G IJ SOLR
1.0000 g | Freq: Once | INTRAMUSCULAR | Status: AC
Start: 2023-10-14 — End: 2023-10-14
  Administered 2023-10-14: 1 g via INTRAMUSCULAR

## 2023-10-14 MED ORDER — CEFPODOXIME PROXETIL 100 MG PO TABS: 100.0000 mg | ORAL_TABLET | Freq: Two times a day (BID) | ORAL | 0 refills | Status: AC

## 2023-10-14 NOTE — Progress Notes (Signed)
 [facility] [address] [place] [address] Dept: [phone] Dept Fax: [phone]  Acute Care Office Visit  Subjective:   Amanda [name] Aug 09, 1945 10/14/2023  Chief Complaint  Patient presents with   Urinary Retention    Urinary burning  Elevated b/p     HPI:  Discussed the use of AI scribe software for clinical note transcription with the patient, who gave verbal consent to proceed.  History of Present Illness   Amanda [name] is a 78 year old female with a history of acute cystitis who presents with burning urination and incomplete bladder emptying.  She experiences burning with urination and a sensation of incomplete bladder emptying, describing the burning as feeling 'like I'm on fire' and noting that she strains to urinate more. These symptoms began on Friday, July 25th, around 5 PM, and persisted throughout the weekend. No hematuria, but there is urgency and incomplete emptying. Mild nausea was present, relieved by Zofran .  She was recently hospitalized from July 15th to 16th for acute cystitis with hematuria, during which she received IV antibiotics and fluids. Post-discharge, she completed a two-day course of cefdinir 300 mg PO BID.  In addition to burning urination, she reports chills and a feeling of soreness all around her back and abdomen, which started this morning. Her current self-treatment includes Azo, cranberry gummies, and Monistat. She has been drinking plenty of water to manage her symptoms.  Regarding her blood pressure, she has been taking losartan  100 mg once daily at bedtime. She notes that her blood pressure has been fluctuating, reaching high levels at night, prompting her to take hydrochlorothiazide , which helped lower it. Her blood pressure readings have been between 130s to 150s over 70s to 80s.       The following portions of the patient's history were reviewed and updated as  appropriate: past medical history, past surgical history, family history, social history, allergies, medications, and problem list.   Patient Active Problem List   Diagnosis Date Noted   Abdominal aortic aneurysm (AAA) without rupture (HCC) 10/09/2023   Closed fracture of right distal radius and ulna 10/09/2023   Mild aortic insufficiency by transthoracic echocardiogram last evaluated May 2024.  Valve anatomy reported as bicuspid however noted as normal on prior studies 02/13/2023   Mild ascending aorta dilatation (HCC), 4.2 cm by transthoracic echocardiogram May 2024.  Was similar in size on prior CT coronary imaging from January 2020 02/13/2023   Infrarenal abdominal aortic aneurysm (AAA) without rupture (HCC) 09/17/2022   Pain in toes of both feet 03/29/2022   Primary hypertension 03/19/2022   Gastroesophageal reflux disease without esophagitis 03/19/2022   History of falling 03/19/2022   Shoulder pain, left 08/31/2021   Pituitary adenoma (HCC) 03/31/2021   Pituitary mass (HCC) 02/14/2021   Mixed stress and urge urinary incontinence 11/29/2020   Sensorineural hearing loss (SNHL) of both ears 10/13/2020   Hyperlipidemia, unspecified 08/02/2020   PVD (peripheral vascular disease) (HCC) 08/01/2020   Peripheral arterial disease (HCC) 07/26/2020   Major depressive disorder with single episode, in full remission (HCC) 01/22/2020   DDD (degenerative disc disease), cervical 03/25/2019   Statin intolerance 01/26/2019   Insomnia due to other mental disorder 12/30/2018   Lumbar spondylosis 12/26/2017   Degeneration of lumbar intervertebral disc 07/09/2017   Lumbar post-laminectomy syndrome 07/09/2017   Herniated lumbar intervertebral disc 11/07/2016   Chronic right sacroiliac joint pain 06/08/2016   Chronic right-sided low back pain with right-sided sciatica 06/08/2016  Right rotator cuff tendonitis 03/08/2016   Anxiety disorder, unspecified 08/29/2015   Osteoporosis 08/29/2015    Hypocalcemia 08/29/2015   CAD, cath February 05 1569% small nondominant RCA lesion medically treated; CT coronary angio January 2020 calcium  score 53, mild proximal LAD disease 04/01/2014   Stricture of esophagus    Diverticulosis    Vitamin D  deficiency    Hiatal hernia    Hemorrhoids    Arthritis    Osteopenia    Family history of malignant neoplasm of gastrointestinal tract 08/21/2010   Dysphagia 08/21/2010   ESOPHAGEAL REFLUX 01/11/2009   Past Medical History:  Diagnosis Date   Anxiety    Arthritis    Basal cell carcinoma (BCC) of left ala nasi 09/12/2018   Basal cell carcinoma (BCC) of right lower leg 06/06/2020   CAD (coronary artery disease)    a. 01/2014: cath showing 70% stenosis of non-dominant RCA --> medically managed.    Cataract    bilat removed   DDD (degenerative disc disease), cervical    DDD (degenerative disc disease), lumbar    Depression    Diverticulosis    GERD (gastroesophageal reflux disease)    Hemorrhoids    Hiatal hernia    Hyperlipidemia    Hypertension    Melanoma (HCC)    Orbital fracture (HCC)    right   Osteopenia 07/2015   T score -2.0 FRAX 10%/1.4%   Osteoporosis 08/29/2015   Pituitary tumor    Stricture of esophagus    Vitamin D  deficiency    Past Surgical History:  Procedure Laterality Date   ABDOMINAL AORTOGRAM W/LOWER EXTREMITY N/A 08/01/2020   Procedure: ABDOMINAL AORTOGRAM W/LOWER EXTREMITY;  Surgeon: Court Dorn PARAS, MD;  Location: [facility] IN[facility]AS[facility];  Service: Cardiovascular;  Laterality: N/A;   CERVICAL DISC SURGERY     x 2   COLONOSCOPY     EXCISION OF SKIN CANCER     Melanoma   FOOT SURGERY Right    HEMORRHOID BANDING     X3   INCONTINENCE SURGERY  12/2020   LEFT HEART CATHETERIZATION WITH CORONARY ANGIOGRAM N/A 02/03/2014   Procedure: LEFT HEART CATHETERIZATION WITH CORONARY ANGIOGRAM;  Surgeon: Ozell JONETTA Fell, MD;  Location: [facility] CATH LAB;  Service: Cardiovascular;  Laterality: N/A;   LUMBAR DISC SURGERY  2018    OOPHORECTOMY  2012   BSO   PELVIC LAPAROSCOPY  2012   Diag Lap-BSO-lysis of adhesions   PERIPHERAL VASCULAR INTERVENTION Left 08/01/2020   Procedure: PERIPHERAL VASCULAR INTERVENTION;  Surgeon: Court Dorn PARAS, MD;  Location: [facility]VAS[facility] CV [facility];  Service: Cardiovascular;  Laterality: Left;   TUBAL LIGATION     VAGINAL HYSTERECTOMY  1993   Family History  Problem Relation Age of Onset   Heart disease Mother    Hypertension Mother    Stroke Mother    Colon polyps Father    Heart disease Father        CHF   Hypertension Father    Cancer Father        COLON   Colon cancer Father    Colon cancer Sister    Colon polyps Sister    Heart disease Sister    Diabetes Sister    Melanoma Sister    Heart disease Sister    Heart disease Sister    Colon polyps Brother    Diabetes Brother    Heart disease Brother    Cancer Brother        COLON   Melanoma Brother  Colon cancer Brother    Melanoma Brother    Skin cancer Brother    Skin cancer Brother    Prostate cancer Brother    Skin cancer Daughter    Hypertension Daughter    Skin cancer Daughter    Migraines Daughter    Skin cancer Son    Esophageal cancer Neg Hx    Rectal cancer Neg Hx    Stomach cancer Neg Hx    Breast cancer Neg Hx     Current Outpatient Medications:    AMBULATORY NON FORMULARY MEDICATION, Take 2 tablets by mouth every other day as needed (constipation). TAM herbal laxative Take 2 tablet by mouth every other day as needed, Disp: , Rfl:    aspirin  EC 81 MG tablet, Take 81 mg by mouth daily. Swallow whole., Disp: , Rfl:    cefpodoxime  (VANTIN ) 100 MG tablet, Take 1 tablet (100 mg total) by mouth 2 (two) times daily for 7 days., Disp: 14 tablet, Rfl: 0   Cholecalciferol (VITAMIN D3) 50 MCG (2000 UT) TABS, Take 2 tablets by mouth daily., Disp: , Rfl:    diclofenac  Sodium (VOLTAREN ) 1 % GEL, Apply 2-4 g topically as needed (Joint pain)., Disp: , Rfl:    fluticasone (FLONASE) 50 MCG/ACT nasal spray, Place 1  spray into both nostrils daily as needed for allergies., Disp: , Rfl:    hydrochlorothiazide  (HYDRODIURIL ) 25 MG tablet, Take 1 tablet (25 mg total) by mouth daily., Disp: 90 tablet, Rfl: 1   HYDROcodone-acetaminophen  (NORCO) 10-325 MG tablet, Take 1 tablet by mouth every 6 (six) hours as needed (Back pain)., Disp: , Rfl:    LORazepam  (ATIVAN ) 0.5 MG tablet, TAKE 1 TO 2 TABLETS BY MOUTH ONCE DAILY AS NEEDED FOR ANXIETY, Disp: 60 tablet, Rfl: 2   losartan  (COZAAR ) 100 MG tablet, Take 1 tablet (100 mg total) by mouth at bedtime., Disp: 90 tablet, Rfl: 3   MAGNESIUM  CITRATE PO, Take 1 tablet by mouth daily., Disp: , Rfl:    methocarbamol  (ROBAXIN ) 500 MG tablet, Take 1 tablet (500 mg total) by mouth every 8 (eight) hours as needed for muscle spasms., Disp: 30 tablet, Rfl: 0   pantoprazole  (PROTONIX ) 40 MG tablet, Take 1 tablet (40 mg total) by mouth 2 (two) times daily before a meal., Disp: 180 tablet, Rfl: 0   rosuvastatin  (CRESTOR ) 5 MG tablet, Take 0.5 tablets (2.5 mg total) by mouth every other day., Disp: 45 tablet, Rfl: 3   spironolactone  (ALDACTONE ) 25 MG tablet, Take 25 mg by mouth daily as needed (elevated BP)., Disp: , Rfl:   Current Facility-Administered Medications:    cefTRIAXone  (ROCEPHIN ) injection 1 g, 1 g, Intramuscular, Once,  Allergies  Allergen Reactions   Ciprofloxacin Rash and Dermatitis   Lisinopril Swelling   Pneumococcal Vaccine Swelling   Pravastatin  Other (See Comments)    Muscle aches    Amlodipine  Other (See Comments)    swelling  amlodipine    Crestor  [Rosuvastatin ]     myalgias   Evolocumab  Other (See Comments)    cramping  evolocumab    Ezetimibe  Other (See Comments)    felt horrible  ezetimibe    Lipitor [Atorvastatin]     myalgias   Lubiprostone  Other (See Comments)    Vomiting  lubiprostone    Oxycodone  Other (See Comments)    GI upset, nausea     ROS: A complete ROS was performed with pertinent positives/negatives noted in the HPI. The  remainder of the ROS are negative.    Objective:   Today's  Vitals   10/14/23 1340  BP: 124/72  Pulse: 89  Temp: 98.4 F (36.9 C)  TempSrc: Temporal  SpO2: 98%  Weight: 158 lb 9.6 oz (71.9 kg)  Height: 5' 5 (1.651 m)    GENERAL: Well-appearing, in NAD. Well nourished.  SKIN: Pink, warm and dry. No rash, lesion, ulceration, or ecchymoses.  NECK: Trachea midline. Full ROM w/o pain or tenderness. No lymphadenopathy.  RESPIRATORY: Chest wall symmetrical. Respirations even and non-labored. Breath sounds clear to auscultation bilaterally.  CARDIAC: S1, S2 present, regular rate and rhythm. Peripheral pulses 2+ bilaterally.  GI: Abdomen soft, suprapubic tenderness. Normoactive bowel sounds. No rebound tenderness. No hepatomegaly or splenomegaly. No CVA tenderness.  EXTREMITIES: Without clubbing, cyanosis, or edema.  PSYCH/MENTAL STATUS: Alert, oriented x 3. Cooperative, appropriate mood and affect.    Results for orders placed or performed in visit on 10/14/23  POCT Urinalysis Dipstick (Automated)  Result Value Ref Range   Color, UA red    Clarity, UA     Glucose, UA Negative Negative   Bilirubin, UA positive    Ketones, UA neg    Spec Grav, UA 1.015 1.010 - 1.025   Blood, UA neg    pH, UA 6.0 5.0 - 8.0   Protein, UA Negative Negative   Urobilinogen, UA 0.2 0.2 or 1.0 E.U./dL   Nitrite, UA positive    Leukocytes, UA Large (3+) (A) Negative      Assessment & Plan:  Assessment and Plan    Recurrent Urinary Tract Infection (UTI) Recurrent UTI with dysuria, incomplete bladder emptying, chills, and back pain. Previous acute cystitis treated with IV antibiotics and cefdinir. Ciprofloxacin allergy, Macrobid unsuitable due to possible pyelonephritis. Recent urine culture from recent hospitalization showed resistance to bactrim, augmentin, ampicillin. Sensitive for cefpodoxime , rocephin , levaquin.  - Administer 1 gram Rocephin  IM. - Prescribe cefpodoxime  100 mg BID for 7 days. - Send  urine for culture. - Advise Zofran  for nausea PRN. - Recommend acetaminophen  for fever and chills. - Check BMP  - Instruct to maintain hydration. - Advise hospital care if symptoms worsen.  Hypertension Blood pressure 130s-150s/70s-80s. On losartan  100 mg daily. Hydrochlorothiazide  held at this time for concerns of kidney function.  - Continue losartan  100 mg daily. - check BMP       Meds ordered this encounter  Medications   cefTRIAXone  (ROCEPHIN ) injection 1 g   cefpodoxime  (VANTIN ) 100 MG tablet    Sig: Take 1 tablet (100 mg total) by mouth 2 (two) times daily for 7 days.    Dispense:  14 tablet    Refill:  0    Supervising Provider:   SEBASTIAN BEVERLEY NOVAK [[id number]]   Orders Placed This Encounter  Procedures   Urine Culture   Basic metabolic panel with GFR   POCT Urinalysis Dipstick (Automated)   Lab Orders         Urine Culture         Basic metabolic panel with GFR         POCT Urinalysis Dipstick (Automated)     No images are attached to the encounter or orders placed in the encounter.  Return in 1 week (on 10/21/2023) for uti.   Rosina Senters, FNP

## 2023-10-15 ENCOUNTER — Ambulatory Visit: Payer: Self-pay | Admitting: Internal Medicine

## 2023-10-15 DIAGNOSIS — I2510 Atherosclerotic heart disease of native coronary artery without angina pectoris: Secondary | ICD-10-CM | POA: Diagnosis not present

## 2023-10-15 DIAGNOSIS — S32591D Other specified fracture of right pubis, subsequent encounter for fracture with routine healing: Secondary | ICD-10-CM | POA: Diagnosis not present

## 2023-10-15 DIAGNOSIS — Z9181 History of falling: Secondary | ICD-10-CM | POA: Diagnosis not present

## 2023-10-15 DIAGNOSIS — S52531D Colles' fracture of right radius, subsequent encounter for closed fracture with routine healing: Secondary | ICD-10-CM | POA: Diagnosis not present

## 2023-10-15 DIAGNOSIS — K219 Gastro-esophageal reflux disease without esophagitis: Secondary | ICD-10-CM | POA: Diagnosis not present

## 2023-10-15 DIAGNOSIS — F32A Depression, unspecified: Secondary | ICD-10-CM | POA: Diagnosis not present

## 2023-10-15 DIAGNOSIS — K7460 Unspecified cirrhosis of liver: Secondary | ICD-10-CM | POA: Diagnosis not present

## 2023-10-15 DIAGNOSIS — W19XXXD Unspecified fall, subsequent encounter: Secondary | ICD-10-CM | POA: Diagnosis not present

## 2023-10-15 DIAGNOSIS — I10 Essential (primary) hypertension: Secondary | ICD-10-CM | POA: Diagnosis not present

## 2023-10-15 DIAGNOSIS — M1611 Unilateral primary osteoarthritis, right hip: Secondary | ICD-10-CM | POA: Diagnosis not present

## 2023-10-15 DIAGNOSIS — F419 Anxiety disorder, unspecified: Secondary | ICD-10-CM | POA: Diagnosis not present

## 2023-10-15 DIAGNOSIS — E785 Hyperlipidemia, unspecified: Secondary | ICD-10-CM | POA: Diagnosis not present

## 2023-10-15 DIAGNOSIS — S52614D Nondisplaced fracture of right ulna styloid process, subsequent encounter for closed fracture with routine healing: Secondary | ICD-10-CM | POA: Diagnosis not present

## 2023-10-16 DIAGNOSIS — S52531A Colles' fracture of right radius, initial encounter for closed fracture: Secondary | ICD-10-CM | POA: Diagnosis not present

## 2023-10-16 DIAGNOSIS — M19041 Primary osteoarthritis, right hand: Secondary | ICD-10-CM | POA: Diagnosis not present

## 2023-10-16 DIAGNOSIS — M19031 Primary osteoarthritis, right wrist: Secondary | ICD-10-CM | POA: Diagnosis not present

## 2023-10-17 ENCOUNTER — Telehealth: Payer: Self-pay | Admitting: Internal Medicine

## 2023-10-17 DIAGNOSIS — Z9181 History of falling: Secondary | ICD-10-CM | POA: Diagnosis not present

## 2023-10-17 DIAGNOSIS — E785 Hyperlipidemia, unspecified: Secondary | ICD-10-CM | POA: Diagnosis not present

## 2023-10-17 DIAGNOSIS — S52531D Colles' fracture of right radius, subsequent encounter for closed fracture with routine healing: Secondary | ICD-10-CM | POA: Diagnosis not present

## 2023-10-17 DIAGNOSIS — S52614D Nondisplaced fracture of right ulna styloid process, subsequent encounter for closed fracture with routine healing: Secondary | ICD-10-CM | POA: Diagnosis not present

## 2023-10-17 DIAGNOSIS — M1611 Unilateral primary osteoarthritis, right hip: Secondary | ICD-10-CM | POA: Diagnosis not present

## 2023-10-17 DIAGNOSIS — I2510 Atherosclerotic heart disease of native coronary artery without angina pectoris: Secondary | ICD-10-CM | POA: Diagnosis not present

## 2023-10-17 DIAGNOSIS — K7460 Unspecified cirrhosis of liver: Secondary | ICD-10-CM | POA: Diagnosis not present

## 2023-10-17 DIAGNOSIS — K219 Gastro-esophageal reflux disease without esophagitis: Secondary | ICD-10-CM | POA: Diagnosis not present

## 2023-10-17 DIAGNOSIS — F419 Anxiety disorder, unspecified: Secondary | ICD-10-CM | POA: Diagnosis not present

## 2023-10-17 DIAGNOSIS — F32A Depression, unspecified: Secondary | ICD-10-CM | POA: Diagnosis not present

## 2023-10-17 DIAGNOSIS — W19XXXD Unspecified fall, subsequent encounter: Secondary | ICD-10-CM | POA: Diagnosis not present

## 2023-10-17 DIAGNOSIS — S32591D Other specified fracture of right pubis, subsequent encounter for fracture with routine healing: Secondary | ICD-10-CM | POA: Diagnosis not present

## 2023-10-17 DIAGNOSIS — I10 Essential (primary) hypertension: Secondary | ICD-10-CM | POA: Diagnosis not present

## 2023-10-17 LAB — URINE CULTURE
MICRO NUMBER:: 16753387
SPECIMEN QUALITY:: ADEQUATE

## 2023-10-17 NOTE — Telephone Encounter (Signed)
 Copied from CRM [id number]. Topic: Clinical - Home Health Verbal Orders >> Oct 17, 2023  2:34 PM Rea BROCKS wrote: Caller/Agency: [facility]  Callback Number: [phone] Service Requested: Occupational Therapy Frequency: 2 times a week for 2 weeks  1 time a week for 5 weeks  Any new concerns about the patient? No

## 2023-10-17 NOTE — Telephone Encounter (Unsigned)
 Copied from CRM [id number]. Topic: Clinical - Request for Lab/Test Order >> Oct 17, 2023  2:39 PM Chasity T wrote: Reason for CRM: Franky from a pharmacist from [place] is calling in to advise Dr Billy patient is due for an bone density scan and wanted to reach out to see if you can set one up for her to get it done. Any questions he can be reached at [phone].

## 2023-10-18 NOTE — Telephone Encounter (Signed)
 Okay for verbal orders Occupational Therapy 2 times a week for 2 weeks, then 1 time a week for 5 weeks

## 2023-10-18 NOTE — Telephone Encounter (Signed)
 Left voicemail asking patient to return call at [phone].

## 2023-10-18 NOTE — Telephone Encounter (Signed)
 Order already placed for patient at previous office visit couple weeks ago.

## 2023-10-21 ENCOUNTER — Encounter: Payer: Self-pay | Admitting: Internal Medicine

## 2023-10-21 ENCOUNTER — Ambulatory Visit (INDEPENDENT_AMBULATORY_CARE_PROVIDER_SITE_OTHER): Admitting: Internal Medicine

## 2023-10-21 VITALS — BP 128/74 | HR 77 | Temp 97.7°F | Ht 65.0 in | Wt 158.8 lb

## 2023-10-21 DIAGNOSIS — F419 Anxiety disorder, unspecified: Secondary | ICD-10-CM

## 2023-10-21 DIAGNOSIS — N390 Urinary tract infection, site not specified: Secondary | ICD-10-CM

## 2023-10-21 DIAGNOSIS — J301 Allergic rhinitis due to pollen: Secondary | ICD-10-CM

## 2023-10-21 LAB — POC URINALSYSI DIPSTICK (AUTOMATED)
Bilirubin, UA: NEGATIVE
Blood, UA: NEGATIVE
Glucose, UA: NEGATIVE
Ketones, UA: NEGATIVE
Leukocytes, UA: NEGATIVE
Nitrite, UA: NEGATIVE
Protein, UA: NEGATIVE
Spec Grav, UA: 1.015 (ref 1.010–1.025)
Urobilinogen, UA: 0.2 U/dL
pH, UA: 6 (ref 5.0–8.0)

## 2023-10-21 MED ORDER — FLUTICASONE PROPIONATE 50 MCG/ACT NA SUSP
1.0000 | Freq: Every day | NASAL | 6 refills | Status: AC | PRN
Start: 2023-10-21 — End: ?

## 2023-10-21 MED ORDER — LORAZEPAM 0.5 MG PO TABS: ORAL_TABLET | ORAL | 2 refills | Status: DC

## 2023-10-21 NOTE — Progress Notes (Signed)
 [facility] [address] [place] [address] Dept: [phone] Dept Fax: [phone]  Acute Care Office Visit  Subjective:   DERA VANAKEN 1945-12-23 10/21/2023  Chief Complaint  Patient presents with   Follow-up    HPI: Amanda [name] is a 78 yo F who presents for UTI follow up. She has completed recent regimen of cepodoxime 100mg  BID x 7 day. Denies fever, chills, N/V, flank pain, dysuria, hematuria.   Needs refill of Lorazepam  and Flonase . Anxiety well controlled.    The following portions of the patient's history were reviewed and updated as appropriate: past medical history, past surgical history, family history, social history, allergies, medications, and problem list.   Patient Active Problem List   Diagnosis Date Noted   Abdominal aortic aneurysm (AAA) without rupture (HCC) 10/09/2023   Closed fracture of right distal radius and ulna 10/09/2023   Mild aortic insufficiency by transthoracic echocardiogram last evaluated May 2024.  Valve anatomy reported as bicuspid however noted as normal on prior studies 02/13/2023   Mild ascending aorta dilatation (HCC), 4.2 cm by transthoracic echocardiogram May 2024.  Was similar in size on prior CT coronary imaging from January 2020 02/13/2023   Infrarenal abdominal aortic aneurysm (AAA) without rupture (HCC) 09/17/2022   Pain in toes of both feet 03/29/2022   Primary hypertension 03/19/2022   Gastroesophageal reflux disease without esophagitis 03/19/2022   History of falling 03/19/2022   Shoulder pain, left 08/31/2021   Pituitary adenoma (HCC) 03/31/2021   Pituitary mass (HCC) 02/14/2021   Mixed stress and urge urinary incontinence 11/29/2020   Sensorineural hearing loss (SNHL) of both ears 10/13/2020   Hyperlipidemia, unspecified 08/02/2020   PVD (peripheral vascular disease) (HCC) 08/01/2020   Peripheral arterial disease (HCC) 07/26/2020   Major depressive disorder  with single episode, in full remission (HCC) 01/22/2020   DDD (degenerative disc disease), cervical 03/25/2019   Statin intolerance 01/26/2019   Insomnia due to other mental disorder 12/30/2018   Lumbar spondylosis 12/26/2017   Degeneration of lumbar intervertebral disc 07/09/2017   Lumbar post-laminectomy syndrome 07/09/2017   Herniated lumbar intervertebral disc 11/07/2016   Chronic right sacroiliac joint pain 06/08/2016   Chronic right-sided low back pain with right-sided sciatica 06/08/2016   Right rotator cuff tendonitis 03/08/2016   Anxiety disorder, unspecified 08/29/2015   Osteoporosis 08/29/2015   Hypocalcemia 08/29/2015   CAD, cath February 05 1569% small nondominant RCA lesion medically treated; CT coronary angio January 2020 calcium  score 53, mild proximal LAD disease 04/01/2014   Stricture of esophagus    Diverticulosis    Vitamin D  deficiency    Hiatal hernia    Hemorrhoids    Arthritis    Osteopenia    Family history of malignant neoplasm of gastrointestinal tract 08/21/2010   Dysphagia 08/21/2010   ESOPHAGEAL REFLUX 01/11/2009   Past Medical History:  Diagnosis Date   Anxiety    Arthritis    Basal cell carcinoma (BCC) of left ala nasi 09/12/2018   Basal cell carcinoma (BCC) of right lower leg 06/06/2020   CAD (coronary artery disease)    a. 01/2014: cath showing 70% stenosis of non-dominant RCA --> medically managed.    Cataract    bilat removed   DDD (degenerative disc disease), cervical    DDD (degenerative disc disease), lumbar    Depression    Diverticulosis    GERD (gastroesophageal reflux disease)    Hemorrhoids    Hiatal hernia    Hyperlipidemia  Hypertension    Melanoma (HCC)    Orbital fracture (HCC)    right   Osteopenia 07/2015   T score -2.0 FRAX 10%/1.4%   Osteoporosis 08/29/2015   Pituitary tumor    Stricture of esophagus    Vitamin D  deficiency    Past Surgical History:  Procedure Laterality Date   ABDOMINAL AORTOGRAM W/LOWER  EXTREMITY N/A 08/01/2020   Procedure: ABDOMINAL AORTOGRAM W/LOWER EXTREMITY;  Surgeon: Court Dorn PARAS, MD;  Location: [facility] INVASIVE CV LAB;  Service: Cardiovascular;  Laterality: N/A;   CERVICAL DISC SURGERY     x 2   COLONOSCOPY     EXCISION OF SKIN CANCER     Melanoma   FOOT SURGERY Right    HEMORRHOID BANDING     X3   INCONTINENCE SURGERY  12/2020   LEFT HEART CATHETERIZATION WITH CORONARY ANGIOGRAM N/A 02/03/2014   Procedure: LEFT HEART CATHETERIZATION WITH CORONARY ANGIOGRAM;  Surgeon: Ozell JONETTA Fell, MD;  Location: [facility] CATH [facility];  Service: Cardiovascular;  Laterality: N/A;   LUMBAR DISC SURGERY  2018   OOPHORECTOMY  2012   BSO   PELVIC LAPAROSCOPY  2012   Diag Lap-BSO-lysis of adhesions   PERIPHERAL VASCULAR INTERVENTION Left 08/01/2020   Procedure: PERIPHERAL VASCULAR INTERVENTION;  Surgeon: Court Dorn PARAS, MD;  Location: [facility] INVAS[facility] CV [facility];  Service: Cardiovascular;  Laterality: Left;   TUBAL LIGATION     VAGINAL HYSTERECTOMY  1993   Family History  Problem Relation Age of Onset   Heart disease Mother    Hypertension Mother    Stroke Mother    Colon polyps Father    Heart disease Father        CHF   Hypertension Father    Cancer Father        COLON   Colon cancer Father    Colon cancer Sister    Colon polyps Sister    Heart disease Sister    Diabetes Sister    Melanoma Sister    Heart disease Sister    Heart disease Sister    Colon polyps Brother    Diabetes Brother    Heart disease Brother    Cancer Brother        COLON   Melanoma Brother    Colon cancer Brother    Melanoma Brother    Skin cancer Brother    Skin cancer Brother    Prostate cancer Brother    Skin cancer Daughter    Hypertension Daughter    Skin cancer Daughter    Migraines Daughter    Skin cancer Son    Esophageal cancer Neg Hx    Rectal cancer Neg Hx    Stomach cancer Neg Hx    Breast cancer Neg Hx     Current Outpatient Medications:    AMBULATORY NON FORMULARY MEDICATION,  Take 2 tablets by mouth every other day as needed (constipation). TAM herbal laxative Take 2 tablet by mouth every other day as needed, Disp: , Rfl:    aspirin  EC 81 MG tablet, Take 81 mg by mouth daily. Swallow whole., Disp: , Rfl:    cefpodoxime  (VANTIN ) 100 MG tablet, Take 1 tablet (100 mg total) by mouth 2 (two) times daily for 7 days., Disp: 14 tablet, Rfl: 0   Cholecalciferol (VITAMIN D3) 50 MCG (2000 UT) TABS, Take 2 tablets by mouth daily., Disp: , Rfl:    diclofenac  Sodium (VOLTAREN ) 1 % GEL, Apply 2-4 g topically as needed (Joint pain)., Disp: , Rfl:  hydrochlorothiazide  (HYDRODIURIL ) 25 MG tablet, Take 1 tablet (25 mg total) by mouth daily., Disp: 90 tablet, Rfl: 1   HYDROcodone-acetaminophen  (NORCO) 10-325 MG tablet, Take 1 tablet by mouth every 6 (six) hours as needed (Back pain)., Disp: , Rfl:    losartan  (COZAAR ) 100 MG tablet, Take 1 tablet (100 mg total) by mouth at bedtime., Disp: 90 tablet, Rfl: 3   MAGNESIUM  CITRATE PO, Take 1 tablet by mouth daily., Disp: , Rfl:    methocarbamol  (ROBAXIN ) 500 MG tablet, Take 1 tablet (500 mg total) by mouth every 8 (eight) hours as needed for muscle spasms., Disp: 30 tablet, Rfl: 0   pantoprazole  (PROTONIX ) 40 MG tablet, Take 1 tablet (40 mg total) by mouth 2 (two) times daily before a meal., Disp: 180 tablet, Rfl: 0   rosuvastatin  (CRESTOR ) 5 MG tablet, Take 0.5 tablets (2.5 mg total) by mouth every other day., Disp: 45 tablet, Rfl: 3   spironolactone  (ALDACTONE ) 25 MG tablet, Take 25 mg by mouth daily as needed (elevated BP)., Disp: , Rfl:    fluticasone  (FLONASE ) 50 MCG/ACT nasal spray, Place 1 spray into both nostrils daily as needed for allergies., Disp: 16 g, Rfl: 6   LORazepam  (ATIVAN ) 0.5 MG tablet, TAKE 1 TO 2 TABLETS BY MOUTH ONCE DAILY AS NEEDED FOR ANXIETY, Disp: 60 tablet, Rfl: 2 Allergies  Allergen Reactions   Ciprofloxacin Rash and Dermatitis   Lisinopril Swelling   Pneumococcal Vaccine Swelling   Pravastatin  Other (See  Comments)    Muscle aches    Amlodipine  Other (See Comments)    swelling  amlodipine    Crestor  [Rosuvastatin ]     myalgias   Evolocumab  Other (See Comments)    cramping  evolocumab    Ezetimibe  Other (See Comments)    felt horrible  ezetimibe    Lipitor [Atorvastatin]     myalgias   Lubiprostone  Other (See Comments)    Vomiting  lubiprostone    Oxycodone  Other (See Comments)    GI upset, nausea     ROS: A complete ROS was performed with pertinent positives/negatives noted in the HPI. The remainder of the ROS are negative.    Objective:   Today's Vitals   10/21/23 1457  BP: 128/74  Pulse: 77  Temp: 97.7 F (36.5 C)  TempSrc: Temporal  SpO2: 98%  Weight: 158 lb 12.8 oz (72 kg)  Height: 5' 5 (1.651 m)    GENERAL: Well-appearing, in NAD. Well nourished.  SKIN: Pink, warm and dry.  RESPIRATORY: Chest wall symmetrical. Respirations even and non-labored. Breath sounds clear to auscultation bilaterally.  CARDIAC: S1, S2 present, regular rate and rhythm. Peripheral pulses 2+ bilaterally.  GI: Abdomen soft, non-tender. Normoactive bowel sounds. No rebound tenderness. No hepatomegaly or splenomegaly. No CVA tenderness.  EXTREMITIES: Without clubbing, cyanosis, or edema.  NEUROLOGIC: Steady, even gait.  PSYCH/MENTAL STATUS: Alert, oriented x 3. Cooperative, appropriate mood and affect.    Results for orders placed or performed in visit on 10/21/23  POCT Urinalysis Dipstick (Automated)  Result Value Ref Range   Color, UA yellow    Clarity, UA clear    Glucose, UA Negative Negative   Bilirubin, UA neg    Ketones, UA neg    Spec Grav, UA 1.015 1.010 - 1.025   Blood, UA neg    pH, UA 6.0 5.0 - 8.0   Protein, UA Negative Negative   Urobilinogen, UA 0.2 0.2 or 1.0 E.U./dL   Nitrite, UA neg    Leukocytes, UA Negative Negative  Assessment & Plan:  Assessment and Plan    Acute urinary tract infection, resolved Resolved acute urinary tract infection.  Previously treated with cefpodoxime . No current urinary symptoms.  Anxiety disorder Anxiety well controlled. No symptom exacerbation. - Refilled lorazepam  prescription.  Allergic rhinitis No acute exacerbation. - Refilled Flonase  prescription.       Meds ordered this encounter  Medications   fluticasone  (FLONASE ) 50 MCG/ACT nasal spray    Sig: Place 1 spray into both nostrils daily as needed for allergies.    Dispense:  16 g    Refill:  6   LORazepam  (ATIVAN ) 0.5 MG tablet    Sig: TAKE 1 TO 2 TABLETS BY MOUTH ONCE DAILY AS NEEDED FOR ANXIETY    Dispense:  60 tablet    Refill:  2   Orders Placed This Encounter  Procedures   POCT Urinalysis Dipstick (Automated)   Lab Orders         POCT Urinalysis Dipstick (Automated)     No images are attached to the encounter or orders placed in the encounter.  Return for Scheduled Routine Office Visits and as needed.   Rosina Senters, FNP

## 2023-10-22 ENCOUNTER — Telehealth: Payer: Self-pay

## 2023-10-22 ENCOUNTER — Other Ambulatory Visit: Payer: Self-pay

## 2023-10-22 DIAGNOSIS — S32591D Other specified fracture of right pubis, subsequent encounter for fracture with routine healing: Secondary | ICD-10-CM | POA: Diagnosis not present

## 2023-10-22 DIAGNOSIS — S52614D Nondisplaced fracture of right ulna styloid process, subsequent encounter for closed fracture with routine healing: Secondary | ICD-10-CM | POA: Diagnosis not present

## 2023-10-22 DIAGNOSIS — K7460 Unspecified cirrhosis of liver: Secondary | ICD-10-CM | POA: Diagnosis not present

## 2023-10-22 DIAGNOSIS — I10 Essential (primary) hypertension: Secondary | ICD-10-CM | POA: Diagnosis not present

## 2023-10-22 DIAGNOSIS — S52531D Colles' fracture of right radius, subsequent encounter for closed fracture with routine healing: Secondary | ICD-10-CM | POA: Diagnosis not present

## 2023-10-22 DIAGNOSIS — Z9181 History of falling: Secondary | ICD-10-CM | POA: Diagnosis not present

## 2023-10-22 DIAGNOSIS — F32A Depression, unspecified: Secondary | ICD-10-CM | POA: Diagnosis not present

## 2023-10-22 DIAGNOSIS — W19XXXD Unspecified fall, subsequent encounter: Secondary | ICD-10-CM | POA: Diagnosis not present

## 2023-10-22 DIAGNOSIS — M1611 Unilateral primary osteoarthritis, right hip: Secondary | ICD-10-CM | POA: Diagnosis not present

## 2023-10-22 DIAGNOSIS — F419 Anxiety disorder, unspecified: Secondary | ICD-10-CM | POA: Diagnosis not present

## 2023-10-22 DIAGNOSIS — I2510 Atherosclerotic heart disease of native coronary artery without angina pectoris: Secondary | ICD-10-CM | POA: Diagnosis not present

## 2023-10-22 DIAGNOSIS — E785 Hyperlipidemia, unspecified: Secondary | ICD-10-CM | POA: Diagnosis not present

## 2023-10-22 DIAGNOSIS — K219 Gastro-esophageal reflux disease without esophagitis: Secondary | ICD-10-CM | POA: Diagnosis not present

## 2023-10-22 NOTE — Telephone Encounter (Signed)
 Pt c/o BP issue: STAT if pt c/o blurred vision, one-sided weakness or slurred speech.  STAT if BP is GREATER than 180/120 TODAY.  STAT if BP is LESS than 90/60 and SYMPTOMATIC TODAY  1. What is your BP concern? BP has been up and down  2. Have you taken any BP medication today? No - take at night  3. What are your last 5 BP readings? 156/96 - Today  4. Are you having any other symptoms (ex. Dizziness, headache, blurred vision, passed out)? No

## 2023-10-22 NOTE — Telephone Encounter (Signed)
 Called the patient and she reported that her blood pressure has been elevated. She states that she had a UTI and went to the hospital in [place]; while she was in the hospital her Hydrochlorothiazide  medication was discontinued. Over the past 3 weeks her blood pressure has started to increase. Her latest blood pressures are as follows:  156/96 149/83 139/79 137/76 144/80 149/86  She takes her Losartan  at night and she used to take her Hydrochlorothiazide  in the morning and that helped to control her blood pressure. Please advise.

## 2023-10-23 ENCOUNTER — Other Ambulatory Visit: Payer: Self-pay

## 2023-10-23 DIAGNOSIS — S52614D Nondisplaced fracture of right ulna styloid process, subsequent encounter for closed fracture with routine healing: Secondary | ICD-10-CM | POA: Diagnosis not present

## 2023-10-23 DIAGNOSIS — S52531D Colles' fracture of right radius, subsequent encounter for closed fracture with routine healing: Secondary | ICD-10-CM | POA: Diagnosis not present

## 2023-10-23 DIAGNOSIS — W19XXXD Unspecified fall, subsequent encounter: Secondary | ICD-10-CM | POA: Diagnosis not present

## 2023-10-23 DIAGNOSIS — M1611 Unilateral primary osteoarthritis, right hip: Secondary | ICD-10-CM | POA: Diagnosis not present

## 2023-10-23 DIAGNOSIS — S32591D Other specified fracture of right pubis, subsequent encounter for fracture with routine healing: Secondary | ICD-10-CM | POA: Diagnosis not present

## 2023-10-23 DIAGNOSIS — K219 Gastro-esophageal reflux disease without esophagitis: Secondary | ICD-10-CM | POA: Diagnosis not present

## 2023-10-23 DIAGNOSIS — I2510 Atherosclerotic heart disease of native coronary artery without angina pectoris: Secondary | ICD-10-CM | POA: Diagnosis not present

## 2023-10-23 DIAGNOSIS — Z9181 History of falling: Secondary | ICD-10-CM | POA: Diagnosis not present

## 2023-10-23 DIAGNOSIS — F419 Anxiety disorder, unspecified: Secondary | ICD-10-CM | POA: Diagnosis not present

## 2023-10-23 DIAGNOSIS — F32A Depression, unspecified: Secondary | ICD-10-CM | POA: Diagnosis not present

## 2023-10-23 DIAGNOSIS — K7460 Unspecified cirrhosis of liver: Secondary | ICD-10-CM | POA: Diagnosis not present

## 2023-10-23 DIAGNOSIS — E785 Hyperlipidemia, unspecified: Secondary | ICD-10-CM | POA: Diagnosis not present

## 2023-10-23 DIAGNOSIS — I10 Essential (primary) hypertension: Secondary | ICD-10-CM

## 2023-10-23 NOTE — Telephone Encounter (Signed)
 Called the patient and informed her of Dr. Karry recommendation below regarding her elevated blood pressures:  Ask her to have Chem-7 done to see what can be done about blood pressure being still elevated  Patient verbalized understanding and stated that she would have the lab drawn tomorrow (10/24/23). Patient had no further questions at this time.

## 2023-10-24 ENCOUNTER — Telehealth: Payer: Self-pay | Admitting: Internal Medicine

## 2023-10-24 DIAGNOSIS — K219 Gastro-esophageal reflux disease without esophagitis: Secondary | ICD-10-CM | POA: Diagnosis not present

## 2023-10-24 DIAGNOSIS — K7460 Unspecified cirrhosis of liver: Secondary | ICD-10-CM | POA: Diagnosis not present

## 2023-10-24 DIAGNOSIS — S52531D Colles' fracture of right radius, subsequent encounter for closed fracture with routine healing: Secondary | ICD-10-CM | POA: Diagnosis not present

## 2023-10-24 DIAGNOSIS — S52614D Nondisplaced fracture of right ulna styloid process, subsequent encounter for closed fracture with routine healing: Secondary | ICD-10-CM | POA: Diagnosis not present

## 2023-10-24 DIAGNOSIS — E785 Hyperlipidemia, unspecified: Secondary | ICD-10-CM | POA: Diagnosis not present

## 2023-10-24 DIAGNOSIS — W19XXXD Unspecified fall, subsequent encounter: Secondary | ICD-10-CM | POA: Diagnosis not present

## 2023-10-24 DIAGNOSIS — F419 Anxiety disorder, unspecified: Secondary | ICD-10-CM | POA: Diagnosis not present

## 2023-10-24 DIAGNOSIS — F32A Depression, unspecified: Secondary | ICD-10-CM | POA: Diagnosis not present

## 2023-10-24 DIAGNOSIS — I10 Essential (primary) hypertension: Secondary | ICD-10-CM | POA: Diagnosis not present

## 2023-10-24 DIAGNOSIS — I2510 Atherosclerotic heart disease of native coronary artery without angina pectoris: Secondary | ICD-10-CM | POA: Diagnosis not present

## 2023-10-24 DIAGNOSIS — S32591D Other specified fracture of right pubis, subsequent encounter for fracture with routine healing: Secondary | ICD-10-CM | POA: Diagnosis not present

## 2023-10-24 DIAGNOSIS — Z9181 History of falling: Secondary | ICD-10-CM | POA: Diagnosis not present

## 2023-10-24 DIAGNOSIS — M1611 Unilateral primary osteoarthritis, right hip: Secondary | ICD-10-CM | POA: Diagnosis not present

## 2023-10-24 NOTE — Telephone Encounter (Signed)
 Patient dropped off document Home Health Certificate (Order ID [id number]), to be filled out by provider. Patient requested to send it back via Call Patient to pick up within 7-days. Document is located in providers tray at front office.Please advise at Mobile [phone] (mobile)  Home health order came through fax. I put in the dr box

## 2023-10-25 DIAGNOSIS — K219 Gastro-esophageal reflux disease without esophagitis: Secondary | ICD-10-CM | POA: Diagnosis not present

## 2023-10-25 DIAGNOSIS — F32A Depression, unspecified: Secondary | ICD-10-CM | POA: Diagnosis not present

## 2023-10-25 DIAGNOSIS — S52614D Nondisplaced fracture of right ulna styloid process, subsequent encounter for closed fracture with routine healing: Secondary | ICD-10-CM | POA: Diagnosis not present

## 2023-10-25 DIAGNOSIS — W19XXXD Unspecified fall, subsequent encounter: Secondary | ICD-10-CM | POA: Diagnosis not present

## 2023-10-25 DIAGNOSIS — M1611 Unilateral primary osteoarthritis, right hip: Secondary | ICD-10-CM | POA: Diagnosis not present

## 2023-10-25 DIAGNOSIS — E785 Hyperlipidemia, unspecified: Secondary | ICD-10-CM | POA: Diagnosis not present

## 2023-10-25 DIAGNOSIS — I2510 Atherosclerotic heart disease of native coronary artery without angina pectoris: Secondary | ICD-10-CM | POA: Diagnosis not present

## 2023-10-25 DIAGNOSIS — S32591D Other specified fracture of right pubis, subsequent encounter for fracture with routine healing: Secondary | ICD-10-CM | POA: Diagnosis not present

## 2023-10-25 DIAGNOSIS — F419 Anxiety disorder, unspecified: Secondary | ICD-10-CM | POA: Diagnosis not present

## 2023-10-25 DIAGNOSIS — S52531D Colles' fracture of right radius, subsequent encounter for closed fracture with routine healing: Secondary | ICD-10-CM | POA: Diagnosis not present

## 2023-10-25 DIAGNOSIS — I10 Essential (primary) hypertension: Secondary | ICD-10-CM | POA: Diagnosis not present

## 2023-10-25 DIAGNOSIS — Z9181 History of falling: Secondary | ICD-10-CM | POA: Diagnosis not present

## 2023-10-25 DIAGNOSIS — K7460 Unspecified cirrhosis of liver: Secondary | ICD-10-CM | POA: Diagnosis not present

## 2023-10-25 LAB — BASIC METABOLIC PANEL WITH GFR
BUN/Creatinine Ratio: 12 (ref 12–28)
BUN: 11 mg/dL (ref 8–27)
CO2: 22 mmol/L (ref 20–29)
Calcium: 9.2 mg/dL (ref 8.7–10.3)
Chloride: 101 mmol/L (ref 96–106)
Creatinine, Ser: 0.92 mg/dL (ref 0.57–1.00)
Glucose: 87 mg/dL (ref 70–99)
Potassium: 4.4 mmol/L (ref 3.5–5.2)
Sodium: 137 mmol/L (ref 134–144)
eGFR: 65 mL/min/1.73 (ref >59)

## 2023-10-28 ENCOUNTER — Ambulatory Visit (INDEPENDENT_AMBULATORY_CARE_PROVIDER_SITE_OTHER): Payer: Medicare Other

## 2023-10-28 DIAGNOSIS — Z0000 Encounter for general adult medical examination without abnormal findings: Secondary | ICD-10-CM | POA: Diagnosis not present

## 2023-10-28 NOTE — Progress Notes (Signed)
 Subjective:   Amanda [name] is a 78 y.o. who presents for a Medicare Wellness preventive visit.  As a reminder, Annual Wellness Visits don't include a physical exam, and some assessments may be limited, especially if this visit is performed virtually. We may recommend an in-person follow-up visit with your provider if needed.  Visit Complete: Virtual I connected with  Amanda [name] Patient on 10/28/23 by a audio enabled telemedicine application and verified that I am speaking with the correct person using two identifiers.  Patient Location: Home  Provider Location: Office/Clinic  I discussed the limitations of evaluation and management by telemedicine. The patient expressed understanding and agreed to proceed.  Vital Signs: Because this visit was a virtual/telehealth visit, some criteria may be missing or patient reported. Any vitals not documented were not able to be obtained and vitals that have been documented are patient reported.  VideoError- Librarian, academic were attempted between this provider and patient, however failed, due to patient having technical difficulties OR patient did not have access to video capability.  We continued and completed visit with audio only.   Persons Participating in Visit: Patient.  AWV Questionnaire: No: Patient Medicare AWV questionnaire was not completed prior to this visit.  Cardiac Risk Factors include: advanced age (>34men, >57 women);dyslipidemia     Objective:    Today's Vitals   10/28/23 1456  PainSc: 6    There is no height or weight on file to calculate BMI.     10/28/2023    3:06 PM 10/26/2022    3:08 PM 08/01/2020    6:08 AM 05/08/2020    2:39 PM 05/07/2020    3:04 PM 11/22/2015    2:10 PM 07/22/2014    8:41 PM  Advanced Directives  Does Patient Have a Medical Advance Directive? No No No No No No  No   Would patient like information on creating a medical advance directive? No - Patient declined  No -  Patient declined Yes (ED - Information included in AVS) No - Patient declined  No - patient declined information      Data saved with a previous flowsheet row definition    Current Medications (verified) Outpatient Encounter Medications as of 10/28/2023  Medication Sig   AMBULATORY NON FORMULARY MEDICATION Take 2 tablets by mouth every other day as needed (constipation). TAM herbal laxative Take 2 tablet by mouth every other day as needed   aspirin  EC 81 MG tablet Take 81 mg by mouth daily. Swallow whole.   Cholecalciferol (VITAMIN D3) 50 MCG (2000 UT) TABS Take 2 tablets by mouth daily.   diclofenac  Sodium (VOLTAREN ) 1 % GEL Apply 2-4 g topically as needed (Joint pain).   fluticasone  (FLONASE ) 50 MCG/ACT nasal spray Place 1 spray into both nostrils daily as needed for allergies.   HYDROcodone-acetaminophen  (NORCO) 10-325 MG tablet Take 1 tablet by mouth every 6 (six) hours as needed (Back pain).   LORazepam  (ATIVAN ) 0.5 MG tablet TAKE 1 TO 2 TABLETS BY MOUTH ONCE DAILY AS NEEDED FOR ANXIETY   losartan  (COZAAR ) 100 MG tablet Take 1 tablet (100 mg total) by mouth at bedtime.   MAGNESIUM  CITRATE PO Take 1 tablet by mouth daily.   methocarbamol  (ROBAXIN ) 500 MG tablet Take 1 tablet (500 mg total) by mouth every 8 (eight) hours as needed for muscle spasms.   pantoprazole  (PROTONIX ) 40 MG tablet Take 1 tablet (40 mg total) by mouth 2 (two) times daily before a meal.   hydrochlorothiazide  (  HYDRODIURIL ) 25 MG tablet Take 1 tablet (25 mg total) by mouth daily. (Patient not taking: Reported on 10/28/2023)   rosuvastatin  (CRESTOR ) 5 MG tablet Take 0.5 tablets (2.5 mg total) by mouth every other day. (Patient not taking: Reported on 10/28/2023)   spironolactone  (ALDACTONE ) 25 MG tablet Take 25 mg by mouth daily as needed (elevated BP). (Patient not taking: Reported on 10/28/2023)   No facility-administered encounter medications on file as of 10/28/2023.    Allergies (verified) Ciprofloxacin, Lisinopril,  Pneumococcal vaccine, Pravastatin , Amlodipine , Crestor  [rosuvastatin ], Evolocumab , Ezetimibe , Lipitor [atorvastatin], Lubiprostone , and Oxycodone    History: Past Medical History:  Diagnosis Date   Anxiety    Arthritis    Basal cell carcinoma (BCC) of left ala nasi 09/12/2018   Basal cell carcinoma (BCC) of right lower leg 06/06/2020   CAD (coronary artery disease)    a. 01/2014: cath showing 70% stenosis of non-dominant RCA --> medically managed.    Cataract    bilat removed   DDD (degenerative disc disease), cervical    DDD (degenerative disc disease), lumbar    Depression    Diverticulosis    GERD (gastroesophageal reflux disease)    Hemorrhoids    Hiatal hernia    Hyperlipidemia    Hypertension    Melanoma (HCC)    Orbital fracture (HCC)    right   Osteopenia 07/2015   T score -2.0 FRAX 10%/1.4%   Osteoporosis 08/29/2015   Pituitary tumor    Stricture of esophagus    Vitamin D  deficiency    Past Surgical History:  Procedure Laterality Date   ABDOMINAL AORTOGRAM W/LOWER EXTREMITY N/A 08/01/2020   Procedure: ABDOMINAL AORTOGRAM W/LOWER EXTREMITY;  Surgeon: Court Dorn PARAS, MD;  Location: [facility]VASIVE [facility];  Service: Cardiovascular;  Laterality: N/A;   CERVICAL DISC SURGERY     x 2   COLONOSCOPY     EXCISION OF SKIN CANCER     Melanoma   FOOT SURGERY Right    HEMORRHOID BANDING     X3   INCONTINENCE SURGERY  12/2020   LEFT HEART CATHETERIZATION WITH CORONARY ANGIOGRAM N/A 02/03/2014   Procedure: LEFT HEART CATHETERIZATION WITH CORONARY ANGIOGRAM;  Surgeon: Ozell JONETTA Fell, MD;  Location: [facility] CATH LAB;  Service: Cardiovascular;  Laterality: N/A;   LUMBAR DISC SURGERY  2018   OOPHORECTOMY  2012   BSO   PELVIC LAPAROSCOPY  2012   Diag Lap-BSO-lysis of adhesions   PERIPHERAL VASCULAR INTERVENTION Left 08/01/2020   Procedure: PERIPHERAL VASCULAR INTERVENTION;  Surgeon: Court Dorn PARAS, MD;  Location: [facility] INVASIVE CV LAB;  Service: Cardiovascular;  Laterality: Left;    TUBAL LIGATION     VAGINAL HYSTERECTOMY  1993   Family History  Problem Relation Age of Onset   Heart disease Mother    Hypertension Mother    Stroke Mother    Colon polyps Father    Heart disease Father        CHF   Hypertension Father    Cancer Father        COLON   Colon cancer Father    Colon cancer Sister    Colon polyps Sister    Heart disease Sister    Diabetes Sister    Melanoma Sister    Heart disease Sister    Heart disease Sister    Colon polyps Brother    Diabetes Brother    Heart disease Brother    Cancer Brother        COLON   Melanoma Brother  Colon cancer Brother    Melanoma Brother    Skin cancer Brother    Skin cancer Brother    Prostate cancer Brother    Skin cancer Daughter    Hypertension Daughter    Skin cancer Daughter    Migraines Daughter    Skin cancer Son    Esophageal cancer Neg Hx    Rectal cancer Neg Hx    Stomach cancer Neg Hx    Breast cancer Neg Hx    Social History   Socioeconomic History   Marital status: Widowed    Spouse name: Not on file   Number of children: 3   Years of education: Not on file   Highest education level: 10th grade  Occupational History   Occupation: Disabled/retired  Tobacco Use   Smoking status: Former    Current packs/day: 0.00    Types: Cigarettes    Start date: 03/19/1974    Quit date: 03/19/1994    Years since quitting: 29.6   Smokeless tobacco: Never  Vaping Use   Vaping status: Never Used  Substance and Sexual Activity   Alcohol use: Not Currently    Comment: not much when younger, nothing for years   Drug use: Yes    Types: Hydrocodone   Sexual activity: Not Currently    Birth control/protection: Surgical    Comment: 1st intercourse 78 yo-Fewer than 5 partners,des neg  Other Topics Concern   Not on file  Social History Narrative   Lives with daughter    Some caffeine    Social Drivers of Health   Financial Resource Strain: Low Risk  (10/28/2023)   Overall Financial Resource  Strain (CARDIA)    Difficulty of Paying Living Expenses: Not hard at all  Food Insecurity: No Food Insecurity (10/28/2023)   Hunger Vital Sign    Worried About Running Out of Food in the Last Year: Never true    Ran Out of Food in the Last Year: Never true  Transportation Needs: No Transportation Needs (10/28/2023)   PRAPARE - Administrator, Civil Service (Medical): No    Lack of Transportation (Non-Medical): No  Physical Activity: Insufficiently Active (10/28/2023)   Exercise Vital Sign    Days of Exercise per Week: 7 days    Minutes of Exercise per Session: 20 min  Stress: Stress Concern Present (10/28/2023)   [employer] of Occupational Health - Occupational Stress Questionnaire    Feeling of Stress: To some extent  Social Connections: Moderately Isolated (10/28/2023)   Social Connection and Isolation Panel    Frequency of Communication with Friends and Family: More than three times a week    Frequency of Social Gatherings with Friends and Family: More than three times a week    Attends Religious Services: More than 4 times per year    Active Member of [employer] or Organizations: No    Attends Banker Meetings: Never    Marital Status: Widowed    Tobacco Counseling Counseling given: Not Answered    Clinical Intake:  Pre-visit preparation completed: Yes  Pain : 0-10 Pain Score: 6  Pain Type: Chronic pain Pain Location: Back Pain Orientation: Lower, Mid, Upper Pain Descriptors / Indicators: Aching Pain Onset: More than a month ago Pain Frequency: Constant     Nutritional Risks: None Diabetes: No  Lab Results  Component Value Date   HGBA1C 5.4 08/27/2022     How often do you need to have someone help you when you read instructions,  pamphlets, or other written materials from your doctor or pharmacy?: 1 - Never  Interpreter Needed?: No  Information entered by :: NAllen LPN   Activities of Daily Living     10/28/2023    2:59 PM  In  your present state of health, do you have any difficulty performing the following activities:  Hearing? 1  Comment has hearing aids  Vision? 0  Difficulty concentrating or making decisions? 0  Walking or climbing stairs? 0  Dressing or bathing? 0  Doing errands, shopping? 0  Preparing Food and eating ? N  Using the Toilet? N  In the past six months, have you accidently leaked urine? N  Do you have problems with loss of bowel control? N  Managing your Medications? N  Managing your Finances? N  Housekeeping or managing your Housekeeping? N    Patient Care Team: Billy Knee, FNP as PCP - General (Internal Medicine) Court Dorn PARAS, MD as PCP - [facility] (Cardiology) Bonner Ade, MD as Consulting Physician (Physical Medicine and Rehabilitation)  I have updated your Care Teams any recent Medical Services you may have received from other providers in the past year.     Assessment:   This is a routine wellness examination for Amanda [name].  Hearing/Vision screen Hearing Screening - Comments:: Has hearing aids that are maintained Vision Screening - Comments:: Regular eye exams, [facility]   Goals Addressed             This Visit's Progress    Patient Stated       10/28/2023, be able to use right hand again       Depression Screen     10/28/2023    3:08 PM 06/13/2023   10:16 AM 02/07/2023    1:05 PM 01/14/2023    1:07 PM 11/22/2022    1:04 PM 10/26/2022    3:10 PM 10/11/2022    8:37 AM  PHQ 2/9 Scores  PHQ - 2 Score 0 0 0 0 0 0 5  PHQ- 9 Score 0     6 15    Fall Risk     10/28/2023    3:07 PM 10/04/2023   10:33 AM 06/13/2023   10:16 AM 02/07/2023    1:05 PM 01/14/2023    1:07 PM  Fall Risk   Falls in the past year? 1 1 0 0 0  Comment fell off step stool      Number falls in past yr: 0 1 0 0 0  Injury with Fall? 1 1 0 0 0  Comment broke wrist and pelvis      Risk for fall due to : Medication side effect History of fall(s) No Fall Risks No Fall Risks No Fall  Risks  Follow up Falls prevention discussed;Falls evaluation completed Falls prevention discussed Falls prevention discussed Falls prevention discussed Follow up appointment    MEDICARE RISK AT HOME:  Medicare Risk at Home Any stairs in or around the home?: No If so, are there any without handrails?: No Home free of loose throw rugs in walkways, pet beds, electrical cords, etc?: Yes Adequate lighting in your home to reduce risk of falls?: Yes Life alert?: No Use of a cane, walker or w/c?: Yes Grab bars in the bathroom?: Yes Shower chair or bench in shower?: No Elevated toilet seat or a handicapped toilet?: No  TIMED UP AND GO:  Was the test performed?  No  Cognitive Function: 6CIT completed        10/28/2023  3:09 PM 10/26/2022    3:12 PM  6CIT Screen  What Year? 0 points 0 points  What month? 0 points 0 points  What time? 0 points 0 points  Count back from 20 0 points 0 points  Months in reverse 0 points 0 points  Repeat phrase 0 points 2 points  Total Score 0 points 2 points    Immunizations Immunization History  Administered Date(s) Administered   Fluad Quad(high Dose 65+) 12/30/2018   Influenza, High Dose Seasonal PF 12/30/2018   Moderna Sars-Covid-2 Vaccination 04/23/2019, 05/21/2019, 02/18/2020   Pneumococcal Polysaccharide-23 10/01/2013, 03/08/2016   Tdap 05/17/2012   Zoster Recombinant(Shingrix) 08/11/2021, 01/05/2022   Zoster, Unspecified 08/11/2021    Screening Tests Health Maintenance  Topic Date Due   Cervical Cancer Screening (Pap smear)  01/14/2024 (Originally 08/23/2021)   Hepatitis B Vaccines (1 of 3 - Risk 3-dose series) 10/13/2024 (Originally 01/20/2008)   Medicare Annual Wellness (AWV)  10/27/2024   DEXA SCAN  Completed   HPV VACCINES  Aged Out   Meningococcal B Vaccine  Aged Out   DTaP/Tdap/Td  Discontinued   Pneumococcal Vaccine: 50+ Years  Discontinued   INFLUENZA VACCINE  Discontinued   Colonoscopy  Discontinued   COVID-19 Vaccine   Discontinued   Hepatitis C Screening  Discontinued   Zoster Vaccines- Shingrix  Discontinued    Health Maintenance  There are no preventive care reminders to display for this patient.  Health Maintenance Items Addressed: Declines vaccines.  Additional Screening:  Vision Screening: Recommended annual ophthalmology exams for early detection of glaucoma and other disorders of the eye. Would you like a referral to an eye doctor? No    Dental Screening: Recommended annual dental exams for proper oral hygiene  Community Resource Referral / Chronic Care Management: CRR required this visit?  No   CCM required this visit?  No   Plan:    I have personally reviewed and noted the following in the patient's chart:   Medical and social history Use of alcohol, tobacco or illicit drugs  Current medications and supplements including opioid prescriptions. Patient is currently taking opioid prescriptions. Information provided to patient regarding non-opioid alternatives. Patient advised to discuss non-opioid treatment plan with their provider. Functional ability and status Nutritional status Physical activity Advanced directives List of other physicians Hospitalizations, surgeries, and ER visits in previous 12 months Vitals Screenings to include cognitive, depression, and falls Referrals and appointments  In addition, I have reviewed and discussed with patient certain preventive protocols, quality metrics, and best practice recommendations. A written personalized care plan for preventive services as well as general preventive health recommendations were provided to patient.   Amanda FORBES Dawn, LPN   1/88/7974   After Visit Summary: (Pick Up) Due to this being a telephonic visit, with patients personalized plan was offered to patient and patient has requested to Pick up at office.  Notes: Nothing significant to report at this time.

## 2023-10-28 NOTE — Patient Instructions (Signed)
 Amanda [name] , Thank you for taking time out of your busy schedule to complete your Annual Wellness Visit with me. I enjoyed our conversation and look forward to speaking with you again next year. I, as well as your care team,  appreciate your ongoing commitment to your health goals. Please review the following plan we discussed and let me know if I can assist you in the future. Your Game plan/ To Do List    Referrals: If you haven't heard from the office you've been referred to, please reach out to them at the phone provided.   Follow up Visits: We will see or speak with you next year for your Next Medicare AWV with our clinical staff Have you seen your provider in the last 6 months (3 months if uncontrolled diabetes)? Yes  Clinician Recommendations:  Aim for 30 minutes of exercise or brisk walking, 6-8 glasses of water, and 5 servings of fruits and vegetables each day.       This is a list of the screenings recommended for you:  Health Maintenance  Topic Date Due   Pap Smear  01/14/2024*   Hepatitis B Vaccine (1 of 3 - Risk 3-dose series) 10/13/2024*   Medicare Annual Wellness Visit  10/27/2024   DEXA scan (bone density measurement)  Completed   HPV Vaccine  Aged Out   Meningitis B Vaccine  Aged Out   DTaP/Tdap/Td vaccine  Discontinued   Pneumococcal Vaccine for age over 62  Discontinued   Flu Shot  Discontinued   Colon Cancer Screening  Discontinued   COVID-19 Vaccine  Discontinued   Hepatitis C Screening  Discontinued   Zoster (Shingles) Vaccine  Discontinued  *Topic was postponed. The date shown is not the original due date.    Advanced directives: (ACP Link)Information on Advanced Care Planning can be found at [place]  Secretary of [facility] Advance Health Care Directives Advance Health Care Directives. [email or web address]  Advance Care Planning is important because it:  [x]  Makes sure you receive the medical care that is consistent with your values, goals, and preferences  [x]  It  provides guidance to your family and loved ones and reduces their decisional burden about whether or not they are making the right decisions based on your wishes.  Follow the link provided in your after visit summary or read over the paperwork we have mailed to you to help you started getting your Advance Directives in place. If you need assistance in completing these, please reach out to us  so that we can help you!  See attachments for Preventive Care and Fall Prevention Tips.

## 2023-10-29 DIAGNOSIS — S32591D Other specified fracture of right pubis, subsequent encounter for fracture with routine healing: Secondary | ICD-10-CM | POA: Diagnosis not present

## 2023-10-29 DIAGNOSIS — I2510 Atherosclerotic heart disease of native coronary artery without angina pectoris: Secondary | ICD-10-CM | POA: Diagnosis not present

## 2023-10-29 DIAGNOSIS — S52531D Colles' fracture of right radius, subsequent encounter for closed fracture with routine healing: Secondary | ICD-10-CM | POA: Diagnosis not present

## 2023-10-29 DIAGNOSIS — F419 Anxiety disorder, unspecified: Secondary | ICD-10-CM | POA: Diagnosis not present

## 2023-10-29 DIAGNOSIS — I10 Essential (primary) hypertension: Secondary | ICD-10-CM | POA: Diagnosis not present

## 2023-10-29 DIAGNOSIS — K7460 Unspecified cirrhosis of liver: Secondary | ICD-10-CM | POA: Diagnosis not present

## 2023-10-29 DIAGNOSIS — W19XXXD Unspecified fall, subsequent encounter: Secondary | ICD-10-CM | POA: Diagnosis not present

## 2023-10-29 DIAGNOSIS — K219 Gastro-esophageal reflux disease without esophagitis: Secondary | ICD-10-CM | POA: Diagnosis not present

## 2023-10-29 DIAGNOSIS — E785 Hyperlipidemia, unspecified: Secondary | ICD-10-CM | POA: Diagnosis not present

## 2023-10-29 DIAGNOSIS — S52614D Nondisplaced fracture of right ulna styloid process, subsequent encounter for closed fracture with routine healing: Secondary | ICD-10-CM | POA: Diagnosis not present

## 2023-10-29 DIAGNOSIS — M1611 Unilateral primary osteoarthritis, right hip: Secondary | ICD-10-CM | POA: Diagnosis not present

## 2023-10-29 DIAGNOSIS — Z9181 History of falling: Secondary | ICD-10-CM | POA: Diagnosis not present

## 2023-10-29 DIAGNOSIS — F32A Depression, unspecified: Secondary | ICD-10-CM | POA: Diagnosis not present

## 2023-10-30 DIAGNOSIS — K219 Gastro-esophageal reflux disease without esophagitis: Secondary | ICD-10-CM | POA: Diagnosis not present

## 2023-10-30 DIAGNOSIS — Z9181 History of falling: Secondary | ICD-10-CM | POA: Diagnosis not present

## 2023-10-30 DIAGNOSIS — E785 Hyperlipidemia, unspecified: Secondary | ICD-10-CM | POA: Diagnosis not present

## 2023-10-30 DIAGNOSIS — S32591D Other specified fracture of right pubis, subsequent encounter for fracture with routine healing: Secondary | ICD-10-CM | POA: Diagnosis not present

## 2023-10-30 DIAGNOSIS — W19XXXD Unspecified fall, subsequent encounter: Secondary | ICD-10-CM | POA: Diagnosis not present

## 2023-10-30 DIAGNOSIS — K7460 Unspecified cirrhosis of liver: Secondary | ICD-10-CM | POA: Diagnosis not present

## 2023-10-30 DIAGNOSIS — M1611 Unilateral primary osteoarthritis, right hip: Secondary | ICD-10-CM | POA: Diagnosis not present

## 2023-10-30 DIAGNOSIS — F419 Anxiety disorder, unspecified: Secondary | ICD-10-CM | POA: Diagnosis not present

## 2023-10-30 DIAGNOSIS — S52614D Nondisplaced fracture of right ulna styloid process, subsequent encounter for closed fracture with routine healing: Secondary | ICD-10-CM | POA: Diagnosis not present

## 2023-10-30 DIAGNOSIS — I2510 Atherosclerotic heart disease of native coronary artery without angina pectoris: Secondary | ICD-10-CM | POA: Diagnosis not present

## 2023-10-30 DIAGNOSIS — I10 Essential (primary) hypertension: Secondary | ICD-10-CM | POA: Diagnosis not present

## 2023-10-30 DIAGNOSIS — S52531D Colles' fracture of right radius, subsequent encounter for closed fracture with routine healing: Secondary | ICD-10-CM | POA: Diagnosis not present

## 2023-10-30 DIAGNOSIS — F32A Depression, unspecified: Secondary | ICD-10-CM | POA: Diagnosis not present

## 2023-10-31 DIAGNOSIS — F419 Anxiety disorder, unspecified: Secondary | ICD-10-CM | POA: Diagnosis not present

## 2023-10-31 DIAGNOSIS — S52614D Nondisplaced fracture of right ulna styloid process, subsequent encounter for closed fracture with routine healing: Secondary | ICD-10-CM | POA: Diagnosis not present

## 2023-10-31 DIAGNOSIS — S52531D Colles' fracture of right radius, subsequent encounter for closed fracture with routine healing: Secondary | ICD-10-CM | POA: Diagnosis not present

## 2023-10-31 DIAGNOSIS — M1611 Unilateral primary osteoarthritis, right hip: Secondary | ICD-10-CM | POA: Diagnosis not present

## 2023-10-31 DIAGNOSIS — E785 Hyperlipidemia, unspecified: Secondary | ICD-10-CM | POA: Diagnosis not present

## 2023-10-31 DIAGNOSIS — K219 Gastro-esophageal reflux disease without esophagitis: Secondary | ICD-10-CM | POA: Diagnosis not present

## 2023-10-31 DIAGNOSIS — I10 Essential (primary) hypertension: Secondary | ICD-10-CM | POA: Diagnosis not present

## 2023-10-31 DIAGNOSIS — F32A Depression, unspecified: Secondary | ICD-10-CM | POA: Diagnosis not present

## 2023-10-31 DIAGNOSIS — Z9181 History of falling: Secondary | ICD-10-CM | POA: Diagnosis not present

## 2023-10-31 DIAGNOSIS — W19XXXD Unspecified fall, subsequent encounter: Secondary | ICD-10-CM | POA: Diagnosis not present

## 2023-10-31 DIAGNOSIS — I2510 Atherosclerotic heart disease of native coronary artery without angina pectoris: Secondary | ICD-10-CM | POA: Diagnosis not present

## 2023-10-31 DIAGNOSIS — S32591D Other specified fracture of right pubis, subsequent encounter for fracture with routine healing: Secondary | ICD-10-CM | POA: Diagnosis not present

## 2023-10-31 DIAGNOSIS — K7460 Unspecified cirrhosis of liver: Secondary | ICD-10-CM | POA: Diagnosis not present

## 2023-11-04 DIAGNOSIS — Z9181 History of falling: Secondary | ICD-10-CM | POA: Diagnosis not present

## 2023-11-04 DIAGNOSIS — E785 Hyperlipidemia, unspecified: Secondary | ICD-10-CM | POA: Diagnosis not present

## 2023-11-04 DIAGNOSIS — I2510 Atherosclerotic heart disease of native coronary artery without angina pectoris: Secondary | ICD-10-CM | POA: Diagnosis not present

## 2023-11-04 DIAGNOSIS — K219 Gastro-esophageal reflux disease without esophagitis: Secondary | ICD-10-CM | POA: Diagnosis not present

## 2023-11-04 DIAGNOSIS — S52531D Colles' fracture of right radius, subsequent encounter for closed fracture with routine healing: Secondary | ICD-10-CM | POA: Diagnosis not present

## 2023-11-04 DIAGNOSIS — K7460 Unspecified cirrhosis of liver: Secondary | ICD-10-CM | POA: Diagnosis not present

## 2023-11-04 DIAGNOSIS — M1611 Unilateral primary osteoarthritis, right hip: Secondary | ICD-10-CM | POA: Diagnosis not present

## 2023-11-04 DIAGNOSIS — W19XXXD Unspecified fall, subsequent encounter: Secondary | ICD-10-CM | POA: Diagnosis not present

## 2023-11-04 DIAGNOSIS — F32A Depression, unspecified: Secondary | ICD-10-CM | POA: Diagnosis not present

## 2023-11-04 DIAGNOSIS — I10 Essential (primary) hypertension: Secondary | ICD-10-CM | POA: Diagnosis not present

## 2023-11-04 DIAGNOSIS — F419 Anxiety disorder, unspecified: Secondary | ICD-10-CM | POA: Diagnosis not present

## 2023-11-04 DIAGNOSIS — S52614D Nondisplaced fracture of right ulna styloid process, subsequent encounter for closed fracture with routine healing: Secondary | ICD-10-CM | POA: Diagnosis not present

## 2023-11-04 DIAGNOSIS — S32591D Other specified fracture of right pubis, subsequent encounter for fracture with routine healing: Secondary | ICD-10-CM | POA: Diagnosis not present

## 2023-11-05 DIAGNOSIS — S32591D Other specified fracture of right pubis, subsequent encounter for fracture with routine healing: Secondary | ICD-10-CM | POA: Diagnosis not present

## 2023-11-05 DIAGNOSIS — K7460 Unspecified cirrhosis of liver: Secondary | ICD-10-CM | POA: Diagnosis not present

## 2023-11-05 DIAGNOSIS — E785 Hyperlipidemia, unspecified: Secondary | ICD-10-CM | POA: Diagnosis not present

## 2023-11-05 DIAGNOSIS — M1611 Unilateral primary osteoarthritis, right hip: Secondary | ICD-10-CM | POA: Diagnosis not present

## 2023-11-05 DIAGNOSIS — K219 Gastro-esophageal reflux disease without esophagitis: Secondary | ICD-10-CM | POA: Diagnosis not present

## 2023-11-05 DIAGNOSIS — S52531D Colles' fracture of right radius, subsequent encounter for closed fracture with routine healing: Secondary | ICD-10-CM | POA: Diagnosis not present

## 2023-11-05 DIAGNOSIS — S52614D Nondisplaced fracture of right ulna styloid process, subsequent encounter for closed fracture with routine healing: Secondary | ICD-10-CM | POA: Diagnosis not present

## 2023-11-05 DIAGNOSIS — W19XXXD Unspecified fall, subsequent encounter: Secondary | ICD-10-CM | POA: Diagnosis not present

## 2023-11-05 DIAGNOSIS — I10 Essential (primary) hypertension: Secondary | ICD-10-CM | POA: Diagnosis not present

## 2023-11-05 DIAGNOSIS — I2510 Atherosclerotic heart disease of native coronary artery without angina pectoris: Secondary | ICD-10-CM | POA: Diagnosis not present

## 2023-11-05 DIAGNOSIS — F419 Anxiety disorder, unspecified: Secondary | ICD-10-CM | POA: Diagnosis not present

## 2023-11-05 DIAGNOSIS — Z9181 History of falling: Secondary | ICD-10-CM | POA: Diagnosis not present

## 2023-11-05 DIAGNOSIS — F32A Depression, unspecified: Secondary | ICD-10-CM | POA: Diagnosis not present

## 2023-11-11 ENCOUNTER — Ambulatory Visit: Payer: Self-pay

## 2023-11-11 ENCOUNTER — Telehealth: Payer: Self-pay | Admitting: Internal Medicine

## 2023-11-11 DIAGNOSIS — W19XXXD Unspecified fall, subsequent encounter: Secondary | ICD-10-CM | POA: Diagnosis not present

## 2023-11-11 DIAGNOSIS — F32A Depression, unspecified: Secondary | ICD-10-CM | POA: Diagnosis not present

## 2023-11-11 DIAGNOSIS — I2510 Atherosclerotic heart disease of native coronary artery without angina pectoris: Secondary | ICD-10-CM | POA: Diagnosis not present

## 2023-11-11 DIAGNOSIS — K219 Gastro-esophageal reflux disease without esophagitis: Secondary | ICD-10-CM | POA: Diagnosis not present

## 2023-11-11 DIAGNOSIS — F419 Anxiety disorder, unspecified: Secondary | ICD-10-CM | POA: Diagnosis not present

## 2023-11-11 DIAGNOSIS — Z9181 History of falling: Secondary | ICD-10-CM | POA: Diagnosis not present

## 2023-11-11 DIAGNOSIS — S52614D Nondisplaced fracture of right ulna styloid process, subsequent encounter for closed fracture with routine healing: Secondary | ICD-10-CM | POA: Diagnosis not present

## 2023-11-11 DIAGNOSIS — M1611 Unilateral primary osteoarthritis, right hip: Secondary | ICD-10-CM | POA: Diagnosis not present

## 2023-11-11 DIAGNOSIS — S52531D Colles' fracture of right radius, subsequent encounter for closed fracture with routine healing: Secondary | ICD-10-CM | POA: Diagnosis not present

## 2023-11-11 DIAGNOSIS — I10 Essential (primary) hypertension: Secondary | ICD-10-CM | POA: Diagnosis not present

## 2023-11-11 DIAGNOSIS — S32591D Other specified fracture of right pubis, subsequent encounter for fracture with routine healing: Secondary | ICD-10-CM | POA: Diagnosis not present

## 2023-11-11 DIAGNOSIS — K7460 Unspecified cirrhosis of liver: Secondary | ICD-10-CM | POA: Diagnosis not present

## 2023-11-11 DIAGNOSIS — E785 Hyperlipidemia, unspecified: Secondary | ICD-10-CM | POA: Diagnosis not present

## 2023-11-11 NOTE — Telephone Encounter (Signed)
 FYI: This call has been transferred to triage nurse: the Triage Nurse. Once the result note has been entered staff can address the message at that time.  Patient called in with the following symptoms:  Red Word:elevated blood pressure   Please advise at [facility] [phone]  Message is routed to Provider Pool.

## 2023-11-11 NOTE — Telephone Encounter (Signed)
 Patient informed of message

## 2023-11-11 NOTE — Telephone Encounter (Signed)
 FYI Only or Action Required?: FYI only for provider.  Patient was last seen in primary care on 10/21/2023 by Amanda Knee, FNP.  Called Nurse Triage reporting Hypertension.  Symptoms began a week ago.  Interventions attempted: Nothing.  Symptoms are: unchanged.  Triage Disposition: See PCP When Office is Open (Within 3 Days)  Patient/caregiver understands and will follow disposition?: Yes Reason for Disposition  Systolic BP >= 160 OR Diastolic >= 100  Answer Assessment - Initial Assessment Questions 1. BLOOD PRESSURE: What is your blood pressure? Did you take at least two measurements 5 minutes apart?     155/87, 157/85, 172/101 2. ONSET: When did you take your blood pressure?     This past week 3. HOW: How did you take your blood pressure? (e.g., automatic home BP monitor, visiting nurse)     Battery operated cuff and has home health nurse 4. HISTORY: Do you have a history of high blood pressure?     yes 5. MEDICINES: Are you taking any medicines for blood pressure? Have you missed any doses recently?     Yes, no missed 6. OTHER SYMPTOMS: Do you have any symptoms? (e.g., blurred vision, chest pain, difficulty breathing, headache, weakness)     Headache at night. 7. PREGNANCY: Is there any chance you are pregnant? When was your last menstrual period?     no  Protocols used: Blood Pressure - High-A-AH

## 2023-11-12 DIAGNOSIS — S52531D Colles' fracture of right radius, subsequent encounter for closed fracture with routine healing: Secondary | ICD-10-CM | POA: Diagnosis not present

## 2023-11-12 DIAGNOSIS — I10 Essential (primary) hypertension: Secondary | ICD-10-CM | POA: Diagnosis not present

## 2023-11-12 DIAGNOSIS — K219 Gastro-esophageal reflux disease without esophagitis: Secondary | ICD-10-CM | POA: Diagnosis not present

## 2023-11-12 DIAGNOSIS — M5416 Radiculopathy, lumbar region: Secondary | ICD-10-CM | POA: Diagnosis not present

## 2023-11-12 DIAGNOSIS — S52614D Nondisplaced fracture of right ulna styloid process, subsequent encounter for closed fracture with routine healing: Secondary | ICD-10-CM | POA: Diagnosis not present

## 2023-11-12 DIAGNOSIS — K7460 Unspecified cirrhosis of liver: Secondary | ICD-10-CM | POA: Diagnosis not present

## 2023-11-12 DIAGNOSIS — F419 Anxiety disorder, unspecified: Secondary | ICD-10-CM | POA: Diagnosis not present

## 2023-11-12 DIAGNOSIS — E785 Hyperlipidemia, unspecified: Secondary | ICD-10-CM | POA: Diagnosis not present

## 2023-11-12 DIAGNOSIS — Z9181 History of falling: Secondary | ICD-10-CM | POA: Diagnosis not present

## 2023-11-12 DIAGNOSIS — S32591D Other specified fracture of right pubis, subsequent encounter for fracture with routine healing: Secondary | ICD-10-CM | POA: Diagnosis not present

## 2023-11-12 DIAGNOSIS — F32A Depression, unspecified: Secondary | ICD-10-CM | POA: Diagnosis not present

## 2023-11-12 DIAGNOSIS — S52531A Colles' fracture of right radius, initial encounter for closed fracture: Secondary | ICD-10-CM | POA: Diagnosis not present

## 2023-11-12 DIAGNOSIS — I2510 Atherosclerotic heart disease of native coronary artery without angina pectoris: Secondary | ICD-10-CM | POA: Diagnosis not present

## 2023-11-12 DIAGNOSIS — W19XXXD Unspecified fall, subsequent encounter: Secondary | ICD-10-CM | POA: Diagnosis not present

## 2023-11-12 DIAGNOSIS — M1611 Unilateral primary osteoarthritis, right hip: Secondary | ICD-10-CM | POA: Diagnosis not present

## 2023-11-13 ENCOUNTER — Encounter: Payer: Self-pay | Admitting: Internal Medicine

## 2023-11-13 ENCOUNTER — Ambulatory Visit (INDEPENDENT_AMBULATORY_CARE_PROVIDER_SITE_OTHER): Admitting: Internal Medicine

## 2023-11-13 VITALS — BP 132/74 | HR 72 | Temp 98.8°F | Ht 65.0 in | Wt 160.6 lb

## 2023-11-13 DIAGNOSIS — I10 Essential (primary) hypertension: Secondary | ICD-10-CM | POA: Diagnosis not present

## 2023-11-13 MED ORDER — ONDANSETRON 4 MG PO TBDP: 4.0000 mg | ORAL_TABLET | Freq: Three times a day (TID) | ORAL | 1 refills | Status: AC | PRN

## 2023-11-13 NOTE — Progress Notes (Signed)
 [facility] [address] [place] [address] Dept: [phone] Dept Fax: [phone]  Acute Care Office Visit  Subjective:   Amanda [name] June 10, 1945 11/13/2023  Chief Complaint  Patient presents with   Hypertension    HPI:  Discussed the use of AI scribe software for clinical note transcription with the patient, who gave verbal consent to proceed.  History of Present Illness   Amanda [name] is a 78 year old female with hypertension who presents for blood pressure management.   Previously, she was on losartan  100mg  and hydrochlorothiazide  25mg , but her blood pressure dropped while hospitalized, leading to a temporary discontinuation of HCTZ. Recently, her blood pressure increased again, prompting the restart of hydrochlorothiazide . She restarted the hydrochlorothiazide  2 days ago. She has been monitoring her blood pressure at home, noting it was elevated yesterday but is now improving.  She mentions experiencing mild headaches and generalized body aches over the last two to three days, which her daughter suspected might be COVID-19. However, a COVID test was negative. She feels better today with reduced nausea and less back pain. No fever, chills, cough/congestion, back pain, urinary symptoms.   She has a nurse visiting once a week to check her blood pressure, which has been consistently in the 150s to 160s systolic range before re-instating hydrochlorothiazide .  Home BP log: 155/93 before starting hydrochlorothiazide . 133/79, 142/82, 162/91,128/88, 114/75   She is also using ondansetron  for nausea as needed and requests a refill for it.      The following portions of the patient's history were reviewed and updated as appropriate: past medical history, past surgical history, family history, social history, allergies, medications, and problem list.   Patient Active Problem List   Diagnosis Date Noted   Abdominal  aortic aneurysm (AAA) without rupture (HCC) 10/09/2023   Closed fracture of right distal radius and ulna 10/09/2023   Mild aortic insufficiency by transthoracic echocardiogram last evaluated May 2024.  Valve anatomy reported as bicuspid however noted as normal on prior studies 02/13/2023   Mild ascending aorta dilatation (HCC), 4.2 cm by transthoracic echocardiogram May 2024.  Was similar in size on prior CT coronary imaging from January 2020 02/13/2023   Infrarenal abdominal aortic aneurysm (AAA) without rupture (HCC) 09/17/2022   Pain in toes of both feet 03/29/2022   Primary hypertension 03/19/2022   Gastroesophageal reflux disease without esophagitis 03/19/2022   History of falling 03/19/2022   Shoulder pain, left 08/31/2021   Pituitary adenoma (HCC) 03/31/2021   Pituitary mass (HCC) 02/14/2021   Mixed stress and urge urinary incontinence 11/29/2020   Sensorineural hearing loss (SNHL) of both ears 10/13/2020   Hyperlipidemia, unspecified 08/02/2020   PVD (peripheral vascular disease) (HCC) 08/01/2020   Peripheral arterial disease (HCC) 07/26/2020   Major depressive disorder with single episode, in full remission (HCC) 01/22/2020   DDD (degenerative disc disease), cervical 03/25/2019   Statin intolerance 01/26/2019   Insomnia due to other mental disorder 12/30/2018   Lumbar spondylosis 12/26/2017   Degeneration of lumbar intervertebral disc 07/09/2017   Lumbar post-laminectomy syndrome 07/09/2017   Herniated lumbar intervertebral disc 11/07/2016   Chronic right sacroiliac joint pain 06/08/2016   Chronic right-sided low back pain with right-sided sciatica 06/08/2016   Right rotator cuff tendonitis 03/08/2016   Anxiety disorder, unspecified 08/29/2015   Osteoporosis 08/29/2015   Hypocalcemia 08/29/2015   CAD, cath February 05 1569% small nondominant RCA lesion medically treated; CT coronary angio January 2020 calcium  score 53, mild proximal  LAD disease 04/01/2014   Stricture of  esophagus    Diverticulosis    Vitamin D  deficiency    Hiatal hernia    Hemorrhoids    Arthritis    Osteopenia    Family history of malignant neoplasm of gastrointestinal tract 08/21/2010   Dysphagia 08/21/2010   ESOPHAGEAL REFLUX 01/11/2009   Past Medical History:  Diagnosis Date   Anxiety    Arthritis    Basal cell carcinoma (BCC) of left ala nasi 09/12/2018   Basal cell carcinoma (BCC) of right lower leg 06/06/2020   CAD (coronary artery disease)    a. 01/2014: cath showing 70% stenosis of non-dominant RCA --> medically managed.    Cataract    bilat removed   DDD (degenerative disc disease), cervical    DDD (degenerative disc disease), lumbar    Depression    Diverticulosis    GERD (gastroesophageal reflux disease)    Hemorrhoids    Hiatal hernia    Hyperlipidemia    Hypertension    Melanoma (HCC)    Orbital fracture (HCC)    right   Osteopenia 07/2015   T score -2.0 FRAX 10%/1.4%   Osteoporosis 08/29/2015   Pituitary tumor    Stricture of esophagus    Vitamin D  deficiency    Past Surgical History:  Procedure Laterality Date   ABDOMINAL AORTOGRAM W/LOWER EXTREMITY N/A 08/01/2020   Procedure: ABDOMINAL AORTOGRAM W/LOWER EXTREMITY;  Surgeon: Court Dorn PARAS, MD;  Location: [facility];  Service: Cardiovascular;  Laterality: N/A;   CERVICAL DISC SURGERY     x 2   COLONOSCOPY     EXCISION OF SKIN CANCER     Melanoma   FOOT SURGERY Right    HEMORRHOID BANDING     X3   INCONTINENCE SURGERY  12/2020   LEFT HEART CATHETERIZATION WITH CORONARY ANGIOGRAM N/A 02/03/2014   Procedure: LEFT HEART CATHETERIZATION WITH CORONARY ANGIOGRAM;  Surgeon: Ozell JONETTA Fell, MD;  Location: [facility] CATH LAB;  Service: Cardiovascular;  Laterality: N/A;   LUMBAR DISC SURGERY  2018   OOPHORECTOMY  2012   BSO   PELVIC LAPAROSCOPY  2012   Diag Lap-BSO-lysis of adhesions   PERIPHERAL VASCULAR INTERVENTION Left 08/01/2020   Procedure: PERIPHERAL VASCULAR INTERVENTION;  Surgeon:  Court Dorn PARAS, MD;  Location: [facility];  Service: Cardiovascular;  Laterality: Left;   TUBAL LIGATION     VAGINAL HYSTERECTOMY  1993   Family History  Problem Relation Age of Onset   Heart disease Mother    Hypertension Mother    Stroke Mother    Colon polyps Father    Heart disease Father        CHF   Hypertension Father    Cancer Father        COLON   Colon cancer Father    Colon cancer Sister    Colon polyps Sister    Heart disease Sister    Diabetes Sister    Melanoma Sister    Heart disease Sister    Heart disease Sister    Colon polyps Brother    Diabetes Brother    Heart disease Brother    Cancer Brother        COLON   Melanoma Brother    Colon cancer Brother    Melanoma Brother    Skin cancer Brother    Skin cancer Brother    Prostate cancer Brother    Skin cancer Daughter    Hypertension Daughter  Skin cancer Daughter    Migraines Daughter    Skin cancer Son    Esophageal cancer Neg Hx    Rectal cancer Neg Hx    Stomach cancer Neg Hx    Breast cancer Neg Hx     Current Outpatient Medications:    AMBULATORY NON FORMULARY MEDICATION, Take 2 tablets by mouth every other day as needed (constipation). TAM herbal laxative Take 2 tablet by mouth every other day as needed, Disp: , Rfl:    aspirin  EC 81 MG tablet, Take 81 mg by mouth daily. Swallow whole., Disp: , Rfl:    Cholecalciferol (VITAMIN D3) 50 MCG (2000 UT) TABS, Take 2 tablets by mouth daily., Disp: , Rfl:    diclofenac  Sodium (VOLTAREN ) 1 % GEL, Apply 2-4 g topically as needed (Joint pain)., Disp: , Rfl:    fluticasone  (FLONASE ) 50 MCG/ACT nasal spray, Place 1 spray into both nostrils daily as needed for allergies., Disp: 16 g, Rfl: 6   hydrochlorothiazide  (HYDRODIURIL ) 25 MG tablet, Take 1 tablet (25 mg total) by mouth daily., Disp: 90 tablet, Rfl: 1   HYDROcodone-acetaminophen  (NORCO) 10-325 MG tablet, Take 1 tablet by mouth every 6 (six) hours as needed (Back pain)., Disp: , Rfl:     LORazepam  (ATIVAN ) 0.5 MG tablet, TAKE 1 TO 2 TABLETS BY MOUTH ONCE DAILY AS NEEDED FOR ANXIETY, Disp: 60 tablet, Rfl: 2   losartan  (COZAAR ) 100 MG tablet, Take 1 tablet (100 mg total) by mouth at bedtime., Disp: 90 tablet, Rfl: 3   MAGNESIUM  CITRATE PO, Take 1 tablet by mouth daily., Disp: , Rfl:    methocarbamol  (ROBAXIN ) 500 MG tablet, Take 1 tablet (500 mg total) by mouth every 8 (eight) hours as needed for muscle spasms., Disp: 30 tablet, Rfl: 0   pantoprazole  (PROTONIX ) 40 MG tablet, Take 1 tablet (40 mg total) by mouth 2 (two) times daily before a meal., Disp: 180 tablet, Rfl: 0   rosuvastatin  (CRESTOR ) 5 MG tablet, Take 0.5 tablets (2.5 mg total) by mouth every other day., Disp: 45 tablet, Rfl: 3   ondansetron  (ZOFRAN -ODT) 4 MG disintegrating tablet, Take 1 tablet (4 mg total) by mouth every 8 (eight) hours as needed for nausea or vomiting., Disp: 20 tablet, Rfl: 1 Allergies  Allergen Reactions   Ciprofloxacin Rash and Dermatitis   Lisinopril Swelling   Pneumococcal Vaccine Swelling   Pravastatin  Other (See Comments)    Muscle aches    Amlodipine  Other (See Comments)    swelling  amlodipine    Crestor  [Rosuvastatin ]     myalgias   Evolocumab  Other (See Comments)    cramping  evolocumab    Ezetimibe  Other (See Comments)    felt horrible  ezetimibe    Lipitor [Atorvastatin]     myalgias   Lubiprostone  Other (See Comments)    Vomiting  lubiprostone    Oxycodone  Other (See Comments)    GI upset, nausea     ROS: A complete ROS was performed with pertinent positives/negatives noted in the HPI. The remainder of the ROS are negative.    Objective:   Today's Vitals   11/13/23 1525  BP: 132/74  Pulse: 72  Temp: 98.8 F (37.1 C)  TempSrc: Temporal  SpO2: 97%  Weight: 160 lb 9.6 oz (72.8 kg)  Height: 5' 5 (1.651 m)    GENERAL: Well-appearing, in NAD. Well nourished.  SKIN: Pink, warm and dry. No rash, lesion, ulceration, or ecchymoses.  NECK: Trachea midline. Full  ROM w/o pain or tenderness. No lymphadenopathy.  RESPIRATORY: Chest  wall symmetrical. Respirations even and non-labored. Breath sounds clear to auscultation bilaterally.  CARDIAC: S1, S2 present, regular rate and rhythm. Peripheral pulses 2+ bilaterally.  EXTREMITIES: Without clubbing, cyanosis, or edema.  NEUROLOGIC: No motor or sensory deficits. Steady, even gait.  PSYCH/MENTAL STATUS: Alert, oriented x 3. Cooperative, appropriate mood and affect.    No results found for any visits on 11/13/23.    Assessment & Plan:  Assessment and Plan    Hypertension Hypertension poorly controlled with systolic readings in 150s-160s. Reintroduced hydrochlorothiazide  after previous discontinuation. Expected stabilization with hydrochlorothiazide  and losartan .  - Continue losartan  100 mg once daily. - Continue hydrochlorothiazide  25 mg once daily. - Advise concurrent intake of losartan  and hydrochlorothiazide . - Monitor blood pressure at home for accurate readings.   Meds ordered this encounter  Medications   ondansetron  (ZOFRAN -ODT) 4 MG disintegrating tablet    Sig: Take 1 tablet (4 mg total) by mouth every 8 (eight) hours as needed for nausea or vomiting.    Dispense:  20 tablet    Refill:  1    Supervising Provider:   THOMPSON, AARON B [[id number]]   No orders of the defined types were placed in this encounter.  Lab Orders  No laboratory test(s) ordered today   No images are attached to the encounter or orders placed in the encounter.  Return for Scheduled Routine Office Visits and as needed.   Rosina Senters, FNP

## 2023-11-20 DIAGNOSIS — W19XXXD Unspecified fall, subsequent encounter: Secondary | ICD-10-CM | POA: Diagnosis not present

## 2023-11-20 DIAGNOSIS — Z9181 History of falling: Secondary | ICD-10-CM | POA: Diagnosis not present

## 2023-11-20 DIAGNOSIS — S32591D Other specified fracture of right pubis, subsequent encounter for fracture with routine healing: Secondary | ICD-10-CM | POA: Diagnosis not present

## 2023-11-20 DIAGNOSIS — F419 Anxiety disorder, unspecified: Secondary | ICD-10-CM | POA: Diagnosis not present

## 2023-11-20 DIAGNOSIS — S52531D Colles' fracture of right radius, subsequent encounter for closed fracture with routine healing: Secondary | ICD-10-CM | POA: Diagnosis not present

## 2023-11-20 DIAGNOSIS — K219 Gastro-esophageal reflux disease without esophagitis: Secondary | ICD-10-CM | POA: Diagnosis not present

## 2023-11-20 DIAGNOSIS — M1611 Unilateral primary osteoarthritis, right hip: Secondary | ICD-10-CM | POA: Diagnosis not present

## 2023-11-20 DIAGNOSIS — F32A Depression, unspecified: Secondary | ICD-10-CM | POA: Diagnosis not present

## 2023-11-20 DIAGNOSIS — E785 Hyperlipidemia, unspecified: Secondary | ICD-10-CM | POA: Diagnosis not present

## 2023-11-20 DIAGNOSIS — S52614D Nondisplaced fracture of right ulna styloid process, subsequent encounter for closed fracture with routine healing: Secondary | ICD-10-CM | POA: Diagnosis not present

## 2023-11-20 DIAGNOSIS — I2510 Atherosclerotic heart disease of native coronary artery without angina pectoris: Secondary | ICD-10-CM | POA: Diagnosis not present

## 2023-11-20 DIAGNOSIS — K7460 Unspecified cirrhosis of liver: Secondary | ICD-10-CM | POA: Diagnosis not present

## 2023-11-20 DIAGNOSIS — I10 Essential (primary) hypertension: Secondary | ICD-10-CM | POA: Diagnosis not present

## 2023-11-21 DIAGNOSIS — E785 Hyperlipidemia, unspecified: Secondary | ICD-10-CM | POA: Diagnosis not present

## 2023-11-21 DIAGNOSIS — S52614D Nondisplaced fracture of right ulna styloid process, subsequent encounter for closed fracture with routine healing: Secondary | ICD-10-CM | POA: Diagnosis not present

## 2023-11-21 DIAGNOSIS — W19XXXD Unspecified fall, subsequent encounter: Secondary | ICD-10-CM | POA: Diagnosis not present

## 2023-11-21 DIAGNOSIS — M1611 Unilateral primary osteoarthritis, right hip: Secondary | ICD-10-CM | POA: Diagnosis not present

## 2023-11-21 DIAGNOSIS — S52531D Colles' fracture of right radius, subsequent encounter for closed fracture with routine healing: Secondary | ICD-10-CM | POA: Diagnosis not present

## 2023-11-21 DIAGNOSIS — F419 Anxiety disorder, unspecified: Secondary | ICD-10-CM | POA: Diagnosis not present

## 2023-11-21 DIAGNOSIS — Z9181 History of falling: Secondary | ICD-10-CM | POA: Diagnosis not present

## 2023-11-21 DIAGNOSIS — S32591D Other specified fracture of right pubis, subsequent encounter for fracture with routine healing: Secondary | ICD-10-CM | POA: Diagnosis not present

## 2023-11-21 DIAGNOSIS — I2510 Atherosclerotic heart disease of native coronary artery without angina pectoris: Secondary | ICD-10-CM | POA: Diagnosis not present

## 2023-11-21 DIAGNOSIS — K7460 Unspecified cirrhosis of liver: Secondary | ICD-10-CM | POA: Diagnosis not present

## 2023-11-21 DIAGNOSIS — K219 Gastro-esophageal reflux disease without esophagitis: Secondary | ICD-10-CM | POA: Diagnosis not present

## 2023-11-21 DIAGNOSIS — I10 Essential (primary) hypertension: Secondary | ICD-10-CM | POA: Diagnosis not present

## 2023-11-21 DIAGNOSIS — F32A Depression, unspecified: Secondary | ICD-10-CM | POA: Diagnosis not present

## 2023-11-27 ENCOUNTER — Telehealth: Payer: Self-pay

## 2023-11-27 DIAGNOSIS — F32A Depression, unspecified: Secondary | ICD-10-CM | POA: Diagnosis not present

## 2023-11-27 DIAGNOSIS — S32591D Other specified fracture of right pubis, subsequent encounter for fracture with routine healing: Secondary | ICD-10-CM | POA: Diagnosis not present

## 2023-11-27 DIAGNOSIS — I2510 Atherosclerotic heart disease of native coronary artery without angina pectoris: Secondary | ICD-10-CM | POA: Diagnosis not present

## 2023-11-27 DIAGNOSIS — I10 Essential (primary) hypertension: Secondary | ICD-10-CM | POA: Diagnosis not present

## 2023-11-27 DIAGNOSIS — S52531D Colles' fracture of right radius, subsequent encounter for closed fracture with routine healing: Secondary | ICD-10-CM | POA: Diagnosis not present

## 2023-11-27 DIAGNOSIS — K219 Gastro-esophageal reflux disease without esophagitis: Secondary | ICD-10-CM | POA: Diagnosis not present

## 2023-11-27 DIAGNOSIS — K7460 Unspecified cirrhosis of liver: Secondary | ICD-10-CM | POA: Diagnosis not present

## 2023-11-27 DIAGNOSIS — S52614D Nondisplaced fracture of right ulna styloid process, subsequent encounter for closed fracture with routine healing: Secondary | ICD-10-CM | POA: Diagnosis not present

## 2023-11-27 DIAGNOSIS — F419 Anxiety disorder, unspecified: Secondary | ICD-10-CM | POA: Diagnosis not present

## 2023-11-27 DIAGNOSIS — W19XXXD Unspecified fall, subsequent encounter: Secondary | ICD-10-CM | POA: Diagnosis not present

## 2023-11-27 DIAGNOSIS — E785 Hyperlipidemia, unspecified: Secondary | ICD-10-CM | POA: Diagnosis not present

## 2023-11-27 DIAGNOSIS — M1611 Unilateral primary osteoarthritis, right hip: Secondary | ICD-10-CM | POA: Diagnosis not present

## 2023-11-27 DIAGNOSIS — Z9181 History of falling: Secondary | ICD-10-CM | POA: Diagnosis not present

## 2023-11-27 NOTE — Telephone Encounter (Signed)
 Copied from CRM #[id number]. Topic: Clinical - Lab/Test Results >> Nov 27, 2023  9:36 AM Dedra B wrote: Reason for CRM: Silvano from [facility] called to report that pt had a resting heart rate of 56 bpm today. She said she normally doesn't have a low heart rate but did today. She had no other symptoms. Silvano can be reached at [phone].

## 2023-11-27 NOTE — Telephone Encounter (Signed)
 Provider aware.  If patient becomes symptomatic, then needs to be evaluated

## 2023-11-28 NOTE — Telephone Encounter (Signed)
 Called [place] from [facility] and informed her of provider comments. She stated that patient was not symptomatic at that time. She mentioned that primary provider is aware of patient's blood pressures being up then down because provider has patient monitoring her Bps at home. Thanked her for taking my call and that I will make Rosina aware of the BP concern and that someone will give patient a call to follow up about her BP readings.

## 2023-11-29 DIAGNOSIS — Z9181 History of falling: Secondary | ICD-10-CM | POA: Diagnosis not present

## 2023-11-29 DIAGNOSIS — S52531D Colles' fracture of right radius, subsequent encounter for closed fracture with routine healing: Secondary | ICD-10-CM | POA: Diagnosis not present

## 2023-11-29 DIAGNOSIS — S52614D Nondisplaced fracture of right ulna styloid process, subsequent encounter for closed fracture with routine healing: Secondary | ICD-10-CM | POA: Diagnosis not present

## 2023-11-29 DIAGNOSIS — F419 Anxiety disorder, unspecified: Secondary | ICD-10-CM | POA: Diagnosis not present

## 2023-11-29 DIAGNOSIS — M1611 Unilateral primary osteoarthritis, right hip: Secondary | ICD-10-CM | POA: Diagnosis not present

## 2023-11-29 DIAGNOSIS — W19XXXD Unspecified fall, subsequent encounter: Secondary | ICD-10-CM | POA: Diagnosis not present

## 2023-11-29 DIAGNOSIS — I2510 Atherosclerotic heart disease of native coronary artery without angina pectoris: Secondary | ICD-10-CM | POA: Diagnosis not present

## 2023-11-29 DIAGNOSIS — S32591D Other specified fracture of right pubis, subsequent encounter for fracture with routine healing: Secondary | ICD-10-CM | POA: Diagnosis not present

## 2023-11-29 DIAGNOSIS — F32A Depression, unspecified: Secondary | ICD-10-CM | POA: Diagnosis not present

## 2023-11-29 DIAGNOSIS — E785 Hyperlipidemia, unspecified: Secondary | ICD-10-CM | POA: Diagnosis not present

## 2023-11-29 DIAGNOSIS — I10 Essential (primary) hypertension: Secondary | ICD-10-CM | POA: Diagnosis not present

## 2023-11-29 DIAGNOSIS — K7460 Unspecified cirrhosis of liver: Secondary | ICD-10-CM | POA: Diagnosis not present

## 2023-11-29 DIAGNOSIS — K219 Gastro-esophageal reflux disease without esophagitis: Secondary | ICD-10-CM | POA: Diagnosis not present

## 2023-11-29 NOTE — Telephone Encounter (Signed)
 She message below if Pt continues to have BP problems please call office for appointment. Per  Rosina Senters, Np.  Avelina Finder, CMA

## 2023-12-02 DIAGNOSIS — S6981XS Other specified injuries of right wrist, hand and finger(s), sequela: Secondary | ICD-10-CM | POA: Diagnosis not present

## 2023-12-02 DIAGNOSIS — S52531D Colles' fracture of right radius, subsequent encounter for closed fracture with routine healing: Secondary | ICD-10-CM | POA: Diagnosis not present

## 2023-12-02 DIAGNOSIS — M25531 Pain in right wrist: Secondary | ICD-10-CM | POA: Diagnosis not present

## 2023-12-02 DIAGNOSIS — M19031 Primary osteoarthritis, right wrist: Secondary | ICD-10-CM | POA: Diagnosis not present

## 2023-12-04 DIAGNOSIS — D485 Neoplasm of uncertain behavior of skin: Secondary | ICD-10-CM | POA: Diagnosis not present

## 2023-12-09 NOTE — Telephone Encounter (Unsigned)
 Copied from CRM #[id number]. Topic: General - Other >> Dec 09, 2023  2:07 PM Mercedes MATSU wrote: Reason for CRM: [facility] called stating that patient to complete bone density exam by 02/01/2024.

## 2023-12-09 NOTE — Telephone Encounter (Signed)
 Order is already in from July.

## 2023-12-10 ENCOUNTER — Other Ambulatory Visit: Payer: Self-pay | Admitting: Internal Medicine

## 2023-12-10 DIAGNOSIS — Z1231 Encounter for screening mammogram for malignant neoplasm of breast: Secondary | ICD-10-CM

## 2023-12-18 NOTE — Telephone Encounter (Unsigned)
 Copied from CRM #[id number]. Topic: General - Other >> Dec 17, 2023 11:06 AM Aleatha BROCKS wrote: Reason for CRM: Sanz from [facility] called to let it be aware that patient missed 1 visit of OT last week,call back # [phone]

## 2023-12-24 NOTE — Telephone Encounter (Signed)
 Provider has been made aware of this.  Angie

## 2023-12-27 ENCOUNTER — Ambulatory Visit
Admission: RE | Admit: 2023-12-27 | Discharge: 2023-12-27 | Disposition: A | Discharge status: Home / Self Care | Source: Ambulatory Visit | Attending: Internal Medicine | Admitting: Internal Medicine

## 2023-12-27 DIAGNOSIS — Z1231 Encounter for screening mammogram for malignant neoplasm of breast: Secondary | ICD-10-CM | POA: Diagnosis not present

## 2023-12-31 DIAGNOSIS — C44622 Squamous cell carcinoma of skin of right upper limb, including shoulder: Secondary | ICD-10-CM | POA: Diagnosis not present

## 2024-01-01 ENCOUNTER — Ambulatory Visit: Payer: Self-pay | Admitting: Internal Medicine

## 2024-01-02 NOTE — Telephone Encounter (Signed)
 Spoke with pt about mammogram result and had no concerns or question understood result.

## 2024-01-07 ENCOUNTER — Encounter: Payer: Self-pay | Admitting: Internal Medicine

## 2024-01-07 ENCOUNTER — Ambulatory Visit: Payer: Self-pay | Admitting: Internal Medicine

## 2024-01-07 ENCOUNTER — Ambulatory Visit: Admitting: Internal Medicine

## 2024-01-07 VITALS — BP 132/72 | HR 78 | Temp 97.5°F | Ht 65.0 in | Wt 162.2 lb

## 2024-01-07 DIAGNOSIS — C44602 Unspecified malignant neoplasm of skin of right upper limb, including shoulder: Secondary | ICD-10-CM

## 2024-01-07 DIAGNOSIS — E782 Mixed hyperlipidemia: Secondary | ICD-10-CM

## 2024-01-07 DIAGNOSIS — M5441 Lumbago with sciatica, right side: Secondary | ICD-10-CM

## 2024-01-07 DIAGNOSIS — K219 Gastro-esophageal reflux disease without esophagitis: Secondary | ICD-10-CM | POA: Diagnosis not present

## 2024-01-07 DIAGNOSIS — I714 Abdominal aortic aneurysm, without rupture, unspecified: Secondary | ICD-10-CM

## 2024-01-07 DIAGNOSIS — M810 Age-related osteoporosis without current pathological fracture: Secondary | ICD-10-CM | POA: Diagnosis not present

## 2024-01-07 DIAGNOSIS — I10 Essential (primary) hypertension: Secondary | ICD-10-CM

## 2024-01-07 DIAGNOSIS — F419 Anxiety disorder, unspecified: Secondary | ICD-10-CM

## 2024-01-07 DIAGNOSIS — G8929 Other chronic pain: Secondary | ICD-10-CM

## 2024-01-07 DIAGNOSIS — M51369 Other intervertebral disc degeneration, lumbar region without mention of lumbar back pain or lower extremity pain: Secondary | ICD-10-CM

## 2024-01-07 DIAGNOSIS — E559 Vitamin D deficiency, unspecified: Secondary | ICD-10-CM | POA: Diagnosis not present

## 2024-01-07 LAB — CBC WITH DIFFERENTIAL/PLATELET
Basophils Absolute: 0 K/uL (ref 0.0–0.1)
Basophils Relative: 0.6 % (ref 0.0–3.0)
Eosinophils Absolute: 0.1 K/uL (ref 0.0–0.7)
Eosinophils Relative: 2.7 % (ref 0.0–5.0)
HCT: 38.3 % (ref 36.0–46.0)
Hemoglobin: 12.8 g/dL (ref 12.0–15.0)
Lymphocytes Relative: 37.9 % (ref 12.0–46.0)
Lymphs Abs: 1.7 K/uL (ref 0.7–4.0)
MCHC: 33.3 g/dL (ref 30.0–36.0)
MCV: 93.5 fl (ref 78.0–100.0)
Monocytes Absolute: 0.4 K/uL (ref 0.1–1.0)
Monocytes Relative: 9.2 % (ref 3.0–12.0)
Neutro Abs: 2.2 K/uL (ref 1.4–7.7)
Neutrophils Relative %: 49.6 % (ref 43.0–77.0)
Platelets: 221 K/uL (ref 150.0–400.0)
RBC: 4.1 Mil/uL (ref 3.87–5.11)
RDW: 13.1 % (ref 11.5–15.5)
WBC: 4.4 K/uL (ref 4.0–10.5)

## 2024-01-07 LAB — BASIC METABOLIC PANEL WITH GFR
BUN: 11 mg/dL (ref 6–23)
CO2: 26 meq/L (ref 19–32)
Calcium: 8.8 mg/dL (ref 8.4–10.5)
Chloride: 103 meq/L (ref 96–112)
Creatinine, Ser: 0.98 mg/dL (ref 0.40–1.20)
GFR: 56.28 mL/min — ABNORMAL LOW (ref >60.00)
Glucose, Bld: 71 mg/dL (ref 70–99)
Potassium: 3.8 meq/L (ref 3.5–5.1)
Sodium: 140 meq/L (ref 135–145)

## 2024-01-07 LAB — VITAMIN D 25 HYDROXY (VIT D DEFICIENCY, FRACTURES): VITD: 47.71 ng/mL (ref 30.00–100.00)

## 2024-01-07 MED ORDER — METHOCARBAMOL 500 MG PO TABS: 500.0000 mg | ORAL_TABLET | Freq: Three times a day (TID) | ORAL | 0 refills | Status: AC | PRN

## 2024-01-07 MED ORDER — LORAZEPAM 0.5 MG PO TABS: ORAL_TABLET | ORAL | 2 refills | Status: DC

## 2024-01-07 NOTE — Patient Instructions (Addendum)
 Bone density scan location: [facility]  [address] [place], [place] [address] Phone: [phone]   Call [place] [facility] and ask them to transfer and fax your records to your new dermatologist in [place]

## 2024-01-07 NOTE — Progress Notes (Signed)
 [facility] [address] [place] [address] Dept: [phone] Dept Fax: [phone]    Subjective:   Amanda [name] 28-Mar-1945 01/07/2024  Chief Complaint  Patient presents with   Follow-up    3 month follow up No more falls, and my blood pressure about the same Medication refills requested for Lorazepam   methocarbamol      HPI: Amanda [name] presents today for re-assessment and management of chronic medical conditions.   History of Present Illness   Amanda [name] is a 78 year old female who presents for a routine follow-up visit.  She has a history of primary hypertension, managed with losartan  100 mg at bedtime and hydrochlorothiazide  25 mg daily, maintaining a blood pressure of 132/72 mmHg.  She takes pantoprazole  40 mg twice daily for gastroesophageal reflux disease.  She experiences anxiety and uses lorazepam  0.5 mg, one to two tablets daily as needed, and is due for a refill.  She has mixed hyperlipidemia and previously took rosuvastatin  2.5 mg every other day but discontinued it due to joint pain. She is currently managing her cholesterol through dietary measures.  She has a history of vitamin D  deficiency and takes a supplement of 2000 units daily.  She has age-related osteoporosis and has been contacted by her insurance regarding a bone density scan, which she has not yet scheduled.  She has an abdominal aortic aneurysm, last measured at 3.1 cm in July 2025, and is under regular monitoring.  She experiences chronic right-sided low back pain with sciatica and lumbar spine degeneration, managed with methocarbamol  500 mg every eight hours as needed, and is due for a refill.  She has recent skin cancer concerns, with biopsies on her nose and shoulder. She is scheduled for surgery on her nose on January 13, 2024, and is seeking to transfer her dermatology records to a new provider.      The  following portions of the patient's history were reviewed and updated as appropriate: past medical history, past surgical history, family history, social history, allergies, medications, and problem list.   Patient Active Problem List   Diagnosis Date Noted   Abdominal aortic aneurysm (AAA) without rupture 10/09/2023   Closed fracture of right distal radius and ulna 10/09/2023   Mild aortic insufficiency by transthoracic echocardiogram last evaluated May 2024.  Valve anatomy reported as bicuspid however noted as normal on prior studies 02/13/2023   Mild ascending aorta dilatation (HCC), 4.2 cm by transthoracic echocardiogram May 2024.  Was similar in size on prior CT coronary imaging from January 2020 02/13/2023   Infrarenal abdominal aortic aneurysm (AAA) without rupture 09/17/2022   Pain in toes of both feet 03/29/2022   Primary hypertension 03/19/2022   Gastroesophageal reflux disease without esophagitis 03/19/2022   History of falling 03/19/2022   Shoulder pain, left 08/31/2021   Pituitary adenoma (HCC) 03/31/2021   Pituitary mass 02/14/2021   Mixed stress and urge urinary incontinence 11/29/2020   Sensorineural hearing loss (SNHL) of both ears 10/13/2020   Hyperlipidemia, unspecified 08/02/2020   PVD (peripheral vascular disease) 08/01/2020   Peripheral arterial disease 07/26/2020   Major depressive disorder with single episode, in full remission 01/22/2020   DDD (degenerative disc disease), cervical 03/25/2019   Statin intolerance 01/26/2019   Insomnia due to other mental disorder 12/30/2018   Lumbar spondylosis 12/26/2017   Degeneration of lumbar intervertebral disc 07/09/2017   Lumbar post-laminectomy syndrome 07/09/2017   Herniated lumbar intervertebral disc 11/07/2016   Chronic right  sacroiliac joint pain 06/08/2016   Chronic right-sided low back pain with right-sided sciatica 06/08/2016   Right rotator cuff tendonitis 03/08/2016   Anxiety disorder, unspecified 08/29/2015    Osteoporosis 08/29/2015   Hypocalcemia 08/29/2015   CAD, cath February 05 1569% small nondominant RCA lesion medically treated; CT coronary angio January 2020 calcium  score 53, mild proximal LAD disease 04/01/2014   Stricture of esophagus    Diverticulosis    Vitamin D  deficiency    Hiatal hernia    Hemorrhoids    Arthritis    Osteopenia    Family history of malignant neoplasm of gastrointestinal tract 08/21/2010   Dysphagia 08/21/2010   ESOPHAGEAL REFLUX 01/11/2009   Past Medical History:  Diagnosis Date   Anxiety    Arthritis    Basal cell carcinoma (BCC) of left ala nasi 09/12/2018   Basal cell carcinoma (BCC) of right lower leg 06/06/2020   CAD (coronary artery disease)    a. 01/2014: cath showing 70% stenosis of non-dominant RCA --> medically managed.    Cataract    bilat removed   DDD (degenerative disc disease), cervical    DDD (degenerative disc disease), lumbar    Depression    Diverticulosis    GERD (gastroesophageal reflux disease)    Hemorrhoids    Hiatal hernia    Hyperlipidemia    Hypertension    Melanoma (HCC)    Orbital fracture (HCC)    right   Osteopenia 07/2015   T score -2.0 FRAX 10%/1.4%   Osteoporosis 08/29/2015   Pituitary tumor    Stricture of esophagus    Vitamin D  deficiency    Past Surgical History:  Procedure Laterality Date   ABDOMINAL AORTOGRAM W/LOWER EXTREMITY N/A 08/01/2020   Procedure: ABDOMINAL AORTOGRAM W/LOWER EXTREMITY;  Surgeon: Court Dorn PARAS, MD;  Location: [facility];  Service: Cardiovascular;  Laterality: N/A;   CERVICAL DISC SURGERY     x 2   COLONOSCOPY     EXCISION OF SKIN CANCER     Melanoma   FOOT SURGERY Right    HEMORRHOID BANDING     X3   INCONTINENCE SURGERY  12/2020   LEFT HEART CATHETERIZATION WITH CORONARY ANGIOGRAM N/A 02/03/2014   Procedure: LEFT HEART CATHETERIZATION WITH CORONARY ANGIOGRAM;  Surgeon: Ozell JONETTA Fell, MD;  Location: [facility] CATH [facility];  Service: Cardiovascular;  Laterality: N/A;    LUMBAR DISC SURGERY  2018   OOPHORECTOMY  2012   BSO   PELVIC LAPAROSCOPY  2012   Diag Lap-BSO-lysis of adhesions   PERIPHERAL VASCULAR INTERVENTION Left 08/01/2020   Procedure: PERIPHERAL VASCULAR INTERVENTION;  Surgeon: Court Dorn PARAS, MD;  Location: [facility];  Service: Cardiovascular;  Laterality: Left;   TUBAL LIGATION     VAGINAL HYSTERECTOMY  1993   Family History  Problem Relation Age of Onset   Heart disease Mother    Hypertension Mother    Stroke Mother    Colon polyps Father    Heart disease Father        CHF   Hypertension Father    Cancer Father        COLON   Colon cancer Father    Colon cancer Sister    Colon polyps Sister    Heart disease Sister    Diabetes Sister    Melanoma Sister    Heart disease Sister    Heart disease Sister    Colon polyps Brother    Diabetes Brother    Heart disease Brother  Cancer Brother        COLON   Melanoma Brother    Colon cancer Brother    Melanoma Brother    Skin cancer Brother    Skin cancer Brother    Prostate cancer Brother    Skin cancer Daughter    Hypertension Daughter    Skin cancer Daughter    Migraines Daughter    Skin cancer Son    Esophageal cancer Neg Hx    Rectal cancer Neg Hx    Stomach cancer Neg Hx    Breast cancer Neg Hx     Current Outpatient Medications:    AMBULATORY NON FORMULARY MEDICATION, Take 2 tablets by mouth every other day as needed (constipation). TAM herbal laxative Take 2 tablet by mouth every other day as needed, Disp: , Rfl:    aspirin  EC 81 MG tablet, Take 81 mg by mouth daily. Swallow whole., Disp: , Rfl:    Cholecalciferol (VITAMIN D3) 50 MCG (2000 UT) TABS, Take 2 tablets by mouth daily., Disp: , Rfl:    diclofenac  Sodium (VOLTAREN ) 1 % GEL, Apply 2-4 g topically as needed (Joint pain)., Disp: , Rfl:    fluticasone  (FLONASE ) 50 MCG/ACT nasal spray, Place 1 spray into both nostrils daily as needed for allergies., Disp: 16 g, Rfl: 6   hydrochlorothiazide   (HYDRODIURIL ) 25 MG tablet, Take 1 tablet (25 mg total) by mouth daily., Disp: 90 tablet, Rfl: 1   HYDROcodone-acetaminophen  (NORCO) 10-325 MG tablet, Take 1 tablet by mouth every 6 (six) hours as needed (Back pain)., Disp: , Rfl:    losartan  (COZAAR ) 100 MG tablet, Take 1 tablet (100 mg total) by mouth at bedtime., Disp: 90 tablet, Rfl: 3   MAGNESIUM  CITRATE PO, Take 1 tablet by mouth daily., Disp: , Rfl:    ondansetron  (ZOFRAN -ODT) 4 MG disintegrating tablet, Take 1 tablet (4 mg total) by mouth every 8 (eight) hours as needed for nausea or vomiting., Disp: 20 tablet, Rfl: 1   pantoprazole  (PROTONIX ) 40 MG tablet, Take 1 tablet (40 mg total) by mouth 2 (two) times daily before a meal., Disp: 180 tablet, Rfl: 0   LORazepam  (ATIVAN ) 0.5 MG tablet, TAKE 1 TO 2 TABLETS BY MOUTH ONCE DAILY AS NEEDED FOR ANXIETY, Disp: 60 tablet, Rfl: 2   methocarbamol  (ROBAXIN ) 500 MG tablet, Take 1 tablet (500 mg total) by mouth every 8 (eight) hours as needed for muscle spasms., Disp: 30 tablet, Rfl: 0   rosuvastatin  (CRESTOR ) 5 MG tablet, Take 0.5 tablets (2.5 mg total) by mouth every other day. (Patient not taking: Reported on 01/07/2024), Disp: 45 tablet, Rfl: 3 Allergies  Allergen Reactions   Ciprofloxacin Rash and Dermatitis   Lisinopril Swelling   Pneumococcal Vaccine Swelling   Pravastatin  Other (See Comments)    Muscle aches    Amlodipine  Other (See Comments)    swelling  amlodipine    Crestor  [Rosuvastatin ]     myalgias   Evolocumab  Other (See Comments)    cramping  evolocumab    Ezetimibe  Other (See Comments)    felt horrible  ezetimibe    Lipitor [Atorvastatin]     myalgias   Lubiprostone  Other (See Comments)    Vomiting  lubiprostone    Oxycodone  Other (See Comments)    GI upset, nausea     ROS: A complete ROS was performed with pertinent positives/negatives noted in the HPI. The remainder of the ROS are negative.    Objective:   Today's Vitals   01/07/24 1041  BP: 132/72   Pulse:  78  Temp: (!) 97.5 F (36.4 C)  TempSrc: Temporal  SpO2: 97%  Weight: 162 lb 3.2 oz (73.6 kg)  Height: 5' 5 (1.651 m)    GENERAL: Well-appearing, in NAD. Well nourished.  SKIN: Pink, warm and dry. Surgical incision with sutures intact to right shoulder. Mild redness, no warmth or discharge.  NECK: Trachea midline. Full ROM w/o pain or tenderness. No lymphadenopathy.  RESPIRATORY: Chest wall symmetrical. Respirations even and non-labored. Breath sounds clear to auscultation bilaterally.  CARDIAC: S1, S2 present, regular rate and rhythm. Peripheral pulses 2+ bilaterally.  MSK: Muscle tone and strength appropriate for age. Joints w/o tenderness, redness, or swelling.  EXTREMITIES: Without clubbing, cyanosis, or edema.  NEUROLOGIC: Steady, even gait.  PSYCH/MENTAL STATUS: Alert, oriented x 3. Cooperative, appropriate mood and affect.   There are no preventive care reminders to display for this patient.  No results found for any visits on 01/07/24.  The 10-year ASCVD risk score (Arnett DK, et al., 2019) is: 22.4%     Assessment & Plan:  Assessment and Plan    Abdominal aortic aneurysm without rupture Abdominal aortic aneurysm measuring 3.1 cm as of July 2025, with no evidence of rupture or significant growth. - Schedule ultrasound in July 2026 to monitor aneurysm size.  Essential hypertension Blood pressure well-controlled at 132/72 mmHg with current medication regimen. - Continue losartan  and hydrochlorothiazide .  Gastroesophageal reflux disease GERD managed with pantoprazole  40 mg BID.  Vitamin D  deficiency Vitamin D  deficiency managed with 2000 IU daily supplementation. - Check vitamin D  level today.  Age-related osteoporosis Bone density scan order placed but not yet scheduled. Insurance inquiries ongoing. Prefers [facility] [employer], systems for convenience. - Place order for bone density scan at [facility]. - Provide contact information for scheduling if not  contacted within a week.  Mixed hyperlipidemia Discontinued rosuvastatin  due to joint pain. Managing cholesterol through diet.  Chronic right-sided low back pain with right-sided sciatica and lumbar spondylosis Chronic low back pain with sciatica managed with Robaxin  as needed, providing significant relief during severe pain episodes. - Refill Robaxin  prescription.  Anxiety disorder Anxiety managed with lorazepam  as needed. - Refill lorazepam  prescription with two refills.  Skin cancer, right shoulder, under active management Recent excision on right shoulder. Also had biopsy on nose with planned surgery on January 13, 2024. Concerns about current dermatologist, plans to transfer care to Dr. Harlene Lope in [place]. - Advise to call [facility] to transfer records to Dr. Harlene Lope. - Monitor shoulder biopsy site for increased redness or signs of infection; consider antibiotics if condition worsens.  General Health Maintenance Up to date on mammogram. Pneumonia and flu vaccines not administered due to allergies.  Follow-Up Routine follow-up and monitoring discussed. - Schedule follow-up appointment with cardiologist in December 2025. - Schedule bone density scan if not contacted within a week.       Orders Placed This Encounter  Procedures   DG Bone Density    Standing Status:   Future    Expiration Date:   01/06/2025    Reason for Exam (SYMPTOM  OR DIAGNOSIS REQUIRED):   postmenopausal estrogen deficiency    Preferred imaging location?:   [facility]   VITAMIN D  25 Hydroxy (Vit-D Deficiency, Fractures)   Basic Metabolic Panel (BMET)   CBC with Differential/Platelet   No images are attached to the encounter or orders placed in the encounter. Meds ordered this encounter  Medications   methocarbamol  (ROBAXIN ) 500 MG tablet    Sig: Take  1 tablet (500 mg total) by mouth every 8 (eight) hours as needed for muscle spasms.    Dispense:  30 tablet     Refill:  0    Supervising Provider:   THOMPSON, AARON B [[id number]]   LORazepam  (ATIVAN ) 0.5 MG tablet    Sig: TAKE 1 TO 2 TABLETS BY MOUTH ONCE DAILY AS NEEDED FOR ANXIETY    Dispense:  60 tablet    Refill:  2    Supervising Provider:   SEBASTIAN BEVERLEY NOVAK [[id number]]    Return in about 6 months (around 07/07/2024) for Chronic Condition follow up.   Rosina Senters, FNP

## 2024-01-10 ENCOUNTER — Ambulatory Visit (INDEPENDENT_AMBULATORY_CARE_PROVIDER_SITE_OTHER)
Admission: RE | Admit: 2024-01-10 | Discharge: 2024-01-10 | Disposition: A | Discharge status: Home / Self Care | Source: Ambulatory Visit | Attending: Internal Medicine | Admitting: Internal Medicine

## 2024-01-10 DIAGNOSIS — M810 Age-related osteoporosis without current pathological fracture: Secondary | ICD-10-CM | POA: Diagnosis not present

## 2024-01-13 DIAGNOSIS — M8589 Other specified disorders of bone density and structure, multiple sites: Secondary | ICD-10-CM | POA: Diagnosis not present

## 2024-01-13 DIAGNOSIS — Z780 Asymptomatic menopausal state: Secondary | ICD-10-CM | POA: Diagnosis not present

## 2024-01-28 DIAGNOSIS — H1132 Conjunctival hemorrhage, left eye: Secondary | ICD-10-CM | POA: Diagnosis not present

## 2024-01-28 DIAGNOSIS — M25511 Pain in right shoulder: Secondary | ICD-10-CM | POA: Diagnosis not present

## 2024-02-06 ENCOUNTER — Encounter: Payer: Self-pay | Admitting: Internal Medicine

## 2024-02-07 DIAGNOSIS — S0285XA Fracture of orbit, unspecified, initial encounter for closed fracture: Secondary | ICD-10-CM | POA: Insufficient documentation

## 2024-02-07 DIAGNOSIS — F32A Depression, unspecified: Secondary | ICD-10-CM | POA: Insufficient documentation

## 2024-02-07 DIAGNOSIS — I10 Essential (primary) hypertension: Secondary | ICD-10-CM | POA: Insufficient documentation

## 2024-02-07 DIAGNOSIS — K219 Gastro-esophageal reflux disease without esophagitis: Secondary | ICD-10-CM | POA: Insufficient documentation

## 2024-02-07 DIAGNOSIS — H269 Unspecified cataract: Secondary | ICD-10-CM | POA: Insufficient documentation

## 2024-02-07 DIAGNOSIS — D497 Neoplasm of unspecified behavior of endocrine glands and other parts of nervous system: Secondary | ICD-10-CM | POA: Insufficient documentation

## 2024-02-07 DIAGNOSIS — F419 Anxiety disorder, unspecified: Secondary | ICD-10-CM | POA: Insufficient documentation

## 2024-02-07 DIAGNOSIS — E785 Hyperlipidemia, unspecified: Secondary | ICD-10-CM | POA: Insufficient documentation

## 2024-02-07 DIAGNOSIS — M51369 Other intervertebral disc degeneration, lumbar region without mention of lumbar back pain or lower extremity pain: Secondary | ICD-10-CM | POA: Insufficient documentation

## 2024-02-17 ENCOUNTER — Ambulatory Visit: Payer: Self-pay

## 2024-02-17 ENCOUNTER — Ambulatory Visit

## 2024-02-17 VITALS — BP 138/88 | HR 64 | Ht 65.0 in | Wt 159.4 lb

## 2024-02-17 DIAGNOSIS — I10 Essential (primary) hypertension: Secondary | ICD-10-CM

## 2024-02-17 DIAGNOSIS — I351 Nonrheumatic aortic (valve) insufficiency: Secondary | ICD-10-CM | POA: Diagnosis not present

## 2024-02-17 DIAGNOSIS — E782 Mixed hyperlipidemia: Secondary | ICD-10-CM

## 2024-02-17 DIAGNOSIS — I714 Abdominal aortic aneurysm, without rupture, unspecified: Secondary | ICD-10-CM

## 2024-02-17 DIAGNOSIS — I2510 Atherosclerotic heart disease of native coronary artery without angina pectoris: Secondary | ICD-10-CM

## 2024-02-17 DIAGNOSIS — I77810 Thoracic aortic ectasia: Secondary | ICD-10-CM

## 2024-02-17 DIAGNOSIS — Q2381 Bicuspid aortic valve: Secondary | ICD-10-CM | POA: Insufficient documentation

## 2024-02-17 MED ORDER — ASPIRIN EC 81 MG PO TBEC: 81.0000 mg | DELAYED_RELEASE_TABLET | ORAL | 3 refills | Status: AC

## 2024-02-17 NOTE — Assessment & Plan Note (Signed)
 Bicuspid aortic valve with right left cusp fusion. Trace to mild aortic insufficiency. Recent echocardiogram May 2025. Has mild ascending aorta dilatation.

## 2024-02-17 NOTE — Assessment & Plan Note (Signed)
 Mild ascending aorta dilatation 4.4 cm on echocardiogram May 2025. Prior cardiac CT January 2020 was 4.2 cm in size.  Will reassess with CT chest and abdomen with contrast to assess aorta

## 2024-02-17 NOTE — Progress Notes (Addendum)
 Cardiology Consultation:    Date:  02/17/2024   ID:  Amanda [name], DOB 11-07-1945, MRN [id number]  PCP:  Billy Knee, FNP  Cardiologist:  Alean SAUNDERS Ramya Vanbergen, MD   Referring MD: Billy Knee, FNP   No chief complaint on file.    ASSESSMENT AND PLAN:   Amanda [name] 78/F with nonobstructive coronary artery disease, prior cardiac cath 2015 and last CT coronary angiogram January 2020, hypertension, hyperlipidemia, statin intolerance with myalgias, bicuspid aortic valve, mild aortic insufficiency and mildly dilated ascending aorta mild abdominal aortic aneurysm, small hiatal hernia on prior CT coronary imaging from January 2020, osteoporosis, depression, anxiety, intolerance to beta-blockers due to sinus bradycardia associated with shortness of breath [resolved with discontinuing atenolol  in the past], small hiatal hernia, chronic back pain.  Echocardiogram 07/19/2023 notes normal biventricular function with LVEF 60 to 65%, asymmetric left ventricle with basal septal segment hypertrophy, diastolic parameters normal, aortic valve described as bicuspid, with ascending aorta measuring 4.4 cm in size.   Here for follow-up visit Problem List Items Addressed This Visit     Primary hypertension   Near optimal. Continue with hydrochlorothiazide  and losartan .       Relevant Medications   aspirin  EC 81 MG tablet   CAD, cath February 05 1569% small nondominant RCA lesion medically treated; CT coronary angio January 2020 calcium  score 53, mild proximal LAD disease - Primary   Remains asymptomatic. She self discontinued aspirin  about a month ago. Recommended given her underlying nonobstructive CAD to resume aspirin  81 mg at least 2 times a week.  She is agreeable to do this.  Did not tolerate statins in the past. See discussion under hyperlipidemia.      Relevant Medications   aspirin  EC 81 MG tablet   Other Relevant Orders   EKG 12-Lead (Completed)   Comp Met (CMET)   Hyperlipidemia,  unspecified   Repeat lipid panel today.  Did not tolerate statins in the past. Similarly was unable to tolerate Zetia  and PCSK9 inhibitors.  Leqvio there was concerns about cost.  She is willing to reconsider if affordable. Will refer to clinical pharmacist at our office to review candidacy for Leqvio and initiate the therapy if cost covered.      Relevant Medications   aspirin  EC 81 MG tablet   Other Relevant Orders   Lipid Profile   AMB Referral to [facility] Pharm-D   Mild aortic insufficiency by transthoracic echocardiogram last evaluated May 2024.  Valve anatomy reported as bicuspid however noted as normal on prior studies   Mild aortic insufficiency associated with bicuspid aortic valve, last echocardiogram May 2025.  Will continue to monitor.      Relevant Medications   aspirin  EC 81 MG tablet   Mild ascending aorta dilatation (HCC), 4.2 cm by transthoracic echocardiogram May 2024.  Was similar in size on prior CT coronary imaging from January 2020   Mild ascending aorta dilatation 4.4 cm on echocardiogram May 2025. Prior cardiac CT January 2020 was 4.2 cm in size.  Will reassess with CT chest and abdomen with contrast to assess aorta      Relevant Medications   aspirin  EC 81 MG tablet   Abdominal aortic aneurysm (AAA) without rupture   Be further evaluated with CTA chest abdomen.       Relevant Medications   aspirin  EC 81 MG tablet   Other Relevant Orders   Comp Met (CMET)   CT ANGIO CHEST AORTA W/CM & OR WO/CM   Bicuspid aortic valve  Bicuspid aortic valve with right left cusp fusion. Trace to mild aortic insufficiency. Recent echocardiogram May 2025. Has mild ascending aorta dilatation.       Relevant Medications   aspirin  EC 81 MG tablet   Return to clinic tentatively in 6 months   History of Present Illness:    Amanda [name] is a 78 y.o. female who is being seen today for follow-up visit. PCP is Billy Knee, FNP. Last visit with me in the  office was 02/13/2023.  Pleasant woman here for the visit by herself.  Her granddaughter lives nearby and helps her out as needed.  history of nonobstructive coronary artery disease, prior cardiac cath 2015 and last CT coronary angiogram January 2020, hypertension, hyperlipidemia, statin intolerance with myalgias, bicuspid aortic valve, mild aortic insufficiency and mildly dilated ascending aorta mild abdominal aortic aneurysm, small hiatal hernia on prior CT coronary imaging from January 2020, osteoporosis, depression, anxiety, intolerance to beta-blockers due to sinus bradycardia associated with shortness of breath [resolved with discontinuing atenolol  in the past], small hiatal hernia, chronic back pain.  Echocardiogram 07/19/2023 notes normal biventricular function with LVEF 60 to 65%, asymmetric left ventricle with basal septal segment hypertrophy, diastolic parameters normal, aortic valve described as bicuspid, with ascending aorta measuring 4.4 cm in size.  Sustained a mechanical fall in the house couple months ago, falling backwards, was evaluated in the hospital overnight and subsequently did home physical therapy for 6 weeks.  No recurrent falls.  From cardiac standpoint no active symptoms. Has chronic back pain and has been taking Norco as needed for pain control.  With frequent bruising she has stopped taking aspirin  about a month ago.  No pedal edema. No orthopnea paroxysmal nocturnal dyspnea.  No palpitations, lightheadedness, dizziness.  No syncopal episodes. No blood in urine or stools  EKG in the clinic today shows sinus rhythm heart rate 64/min, PR interval 176 ms, QRS duration 84 ms, QTc 394 ms.  Anterior Q waves.  Last lipid panel to review is from 05/07/2022 with total cholesterol 230, triglycerides 159, LDL 160, HDL 38.  Recent blood work from 01/07/2024 notes BUN 11, creatinine 0.98, eGFR 56. Sodium 140 and potassium 3.8 Hemoglobin 12.8, hematocrit 38.3, WBC 4.4 and  platelets 221.  Past Medical History:  Diagnosis Date   Abdominal aortic aneurysm (AAA) without rupture 10/09/2023   3.1cm in July 2025     Anxiety    Anxiety disorder, unspecified 08/29/2015   Arthritis    CAD (coronary artery disease)    a. 01/2014: cath showing 70% stenosis of non-dominant RCA --> medically managed.    Cataract    bilat removed   Chronic right sacroiliac joint pain 06/08/2016   Chronic right-sided low back pain with right-sided sciatica 06/08/2016   Closed fracture of right distal radius and ulna 10/09/2023   DDD (degenerative disc disease), cervical    DDD (degenerative disc disease), lumbar    Degeneration of lumbar intervertebral disc 07/09/2017   Depression    Diverticulosis    Dysphagia 08/21/2010   Asymptomatic since undergoing dilatation into 2008.  IMO SNOMED Dx Update Oct 2024     ESOPHAGEAL REFLUX 01/11/2009   Qualifier: Diagnosis of   By: Debrah MD, Lamar BIRCH        Family history of malignant neoplasm of gastrointestinal tract 08/21/2010   Sister with colon cancer     Gastroesophageal reflux disease without esophagitis 03/19/2022   GERD (gastroesophageal reflux disease)    Hemorrhoids    IMO SNOMED Dx  Update Oct 2024     Herniated lumbar intervertebral disc 11/07/2016   Hiatal hernia    History of falling 03/19/2022   Hyperlipidemia    Hyperlipidemia, unspecified 08/02/2020   Hypertension    Hypocalcemia 08/29/2015   Infrarenal abdominal aortic aneurysm (AAA) without rupture 09/17/2022   Repeat US  every 5 years , due 2029     Insomnia due to other mental disorder 12/30/2018   Lumbar post-laminectomy syndrome 07/09/2017   Lumbar spondylosis 12/26/2017   Major depressive disorder with single episode, in full remission 01/22/2020   Mild aortic insufficiency by transthoracic echocardiogram last evaluated May 2024.  Valve anatomy reported as bicuspid however noted as normal on prior studies 02/13/2023   Mild ascending aorta dilatation (HCC), 4.2  cm by transthoracic echocardiogram May 2024.  Was similar in size on prior CT coronary imaging from January 2020 02/13/2023   Mixed stress and urge urinary incontinence 11/29/2020   Formatting of this note might be different from the original.  Added automatically from request for surgery [id number]     Orbital fracture (HCC)    right   Osteopenia 07/2015   T score -2.0 FRAX 10%/1.4%   Osteoporosis 08/29/2015   Pain in toes of both feet 03/29/2022   Peripheral arterial disease 07/26/2020   Peripheral arterial disease     Pituitary adenoma (HCC) 03/31/2021   Pituitary mass 02/14/2021   Pituitary tumor    Primary hypertension 03/19/2022   PVD (peripheral vascular disease) 08/01/2020   Peripheral arterial disease     Right rotator cuff tendonitis 03/08/2016   Sensorineural hearing loss (SNHL) of both ears 10/13/2020   Last Assessment & Plan:   Formatting of this note might be different from the original.  Concern over hearing loss  Slowly progressive.  No history of ear surgery trauma or infection.  Starting to get frustrating.  EXAM shows normal external canals and tympanic membranes bilaterally.  AUDIOGRAM shows bilateral sensorineural hearing loss that is basically symmetric in nature.  PLAN: There is enough   Shoulder pain, left 08/31/2021   Statin intolerance 01/26/2019   Hyperlipidemia     Stricture of esophagus    Vitamin D  deficiency     Past Surgical History:  Procedure Laterality Date   ABDOMINAL AORTOGRAM W/LOWER EXTREMITY N/A 08/01/2020   Procedure: ABDOMINAL AORTOGRAM W/LOWER EXTREMITY;  Surgeon: Court Dorn PARAS, MD;  Location: [facility] INVASIVE CV LAB;  Service: Cardiovascular;  Laterality: N/A;   CERVICAL DISC SURGERY     x 2   COLONOSCOPY     EXCISION OF SKIN CANCER     Melanoma   FOOT SURGERY Right    HEMORRHOID BANDING     X3   INCONTINENCE SURGERY  12/2020   LEFT HEART CATHETERIZATION WITH CORONARY ANGIOGRAM N/A 02/03/2014   Procedure: LEFT HEART CATHETERIZATION WITH  CORONARY ANGIOGRAM;  Surgeon: Ozell JONETTA Fell, MD;  Location: [facility] CATH LAB;  Service: Cardiovascular;  Laterality: N/A;   LUMBAR DISC SURGERY  2018   OOPHORECTOMY  2012   BSO   PELVIC LAPAROSCOPY  2012   Diag Lap-BSO-lysis of adhesions   PERIPHERAL VASCULAR INTERVENTION Left 08/01/2020   Procedure: PERIPHERAL VASCULAR INTERVENTION;  Surgeon: Court Dorn PARAS, MD;  Location: [facility] INVASIVE CV LAB;  Service: Cardiovascular;  Laterality: Left;   TUBAL LIGATION     VAGINAL HYSTERECTOMY  1993    Current Medications: Current Meds  Medication Sig   AMBULATORY NON FORMULARY MEDICATION Take 2 tablets by mouth every other day as needed (constipation). TAM  herbal laxative Take 2 tablet by mouth every other day as needed   Cholecalciferol (VITAMIN D3) 50 MCG (2000 UT) TABS Take 2 tablets by mouth daily.   diclofenac  Sodium (VOLTAREN ) 1 % GEL Apply 2-4 g topically as needed (Joint pain).   fluticasone  (FLONASE ) 50 MCG/ACT nasal spray Place 1 spray into both nostrils daily as needed for allergies.   hydrochlorothiazide  (HYDRODIURIL ) 25 MG tablet Take 1 tablet (25 mg total) by mouth daily.   HYDROcodone-acetaminophen  (NORCO) 10-325 MG tablet Take 1 tablet by mouth every 6 (six) hours as needed (Back pain).   LORazepam  (ATIVAN ) 0.5 MG tablet TAKE 1 TO 2 TABLETS BY MOUTH ONCE DAILY AS NEEDED FOR ANXIETY   losartan  (COZAAR ) 100 MG tablet Take 1 tablet (100 mg total) by mouth at bedtime.   MAGNESIUM  CITRATE PO Take 1 tablet by mouth daily.   methocarbamol  (ROBAXIN ) 500 MG tablet Take 1 tablet (500 mg total) by mouth every 8 (eight) hours as needed for muscle spasms.   ondansetron  (ZOFRAN -ODT) 4 MG disintegrating tablet Take 1 tablet (4 mg total) by mouth every 8 (eight) hours as needed for nausea or vomiting.   pantoprazole  (PROTONIX ) 40 MG tablet Take 1 tablet (40 mg total) by mouth 2 (two) times daily before a meal.   [DISCONTINUED] aspirin  EC 81 MG tablet Take 81 mg by mouth daily. Swallow whole.      Allergies:   Ciprofloxacin, Lisinopril, Pneumococcal vaccine, Pravastatin , Amlodipine , Crestor  [rosuvastatin ], Evolocumab , Ezetimibe , Lipitor [atorvastatin], Lubiprostone , and Oxycodone    Social History   Socioeconomic History   Marital status: Widowed    Spouse name: Not on file   Number of children: 3   Years of education: Not on file   Highest education level: 10th grade  Occupational History   Occupation: Disabled/retired  Tobacco Use   Smoking status: Former    Current packs/day: 0.00    Types: Cigarettes    Start date: 03/19/1974    Quit date: 03/19/1994    Years since quitting: 29.9   Smokeless tobacco: Never  Vaping Use   Vaping status: Never Used  Substance and Sexual Activity   Alcohol use: Not Currently    Comment: not much when younger, nothing for years   Drug use: Yes    Types: Hydrocodone   Sexual activity: Not Currently    Birth control/protection: Surgical    Comment: 1st intercourse 78 yo-Fewer than 5 partners,des neg  Other Topics Concern   Not on file  Social History Narrative   Lives with daughter    Some caffeine    Social Drivers of Health   Financial Resource Strain: Low Risk  (10/28/2023)   Overall Financial Resource Strain (CARDIA)    Difficulty of Paying Living Expenses: Not hard at all  Food Insecurity: No Food Insecurity (10/28/2023)   Hunger Vital Sign    Worried About Running Out of Food in the Last Year: Never true    Ran Out of Food in the Last Year: Never true  Transportation Needs: No Transportation Needs (10/28/2023)   PRAPARE - Administrator, Civil Service (Medical): No    Lack of Transportation (Non-Medical): No  Physical Activity: Insufficiently Active (10/28/2023)   Exercise Vital Sign    Days of Exercise per Week: 7 days    Minutes of Exercise per Session: 20 min  Stress: Stress Concern Present (10/28/2023)   [employer] of Occupational Health - Occupational Stress Questionnaire    Feeling of Stress: To some  extent  Social Connections: Moderately Isolated (10/28/2023)   Social Connection and Isolation Panel    Frequency of Communication with Friends and Family: More than three times a week    Frequency of Social Gatherings with Friends and Family: More than three times a week    Attends Religious Services: More than 4 times per year    Active Member of [employer] or Organizations: No    Attends Banker Meetings: Never    Marital Status: Widowed     Family History: The patient's family history includes Cancer in her brother and father; Colon cancer in her brother, father, and sister; Colon polyps in her brother, father, and sister; Diabetes in her brother and sister; Heart disease in her brother, father, mother, sister, sister, and sister; Hypertension in her daughter, father, and mother; Melanoma in her brother, brother, and sister; Migraines in her daughter; Prostate cancer in her brother; Skin cancer in her brother, brother, daughter, daughter, and son; Stroke in her mother. There is no history of Esophageal cancer, Rectal cancer, Stomach cancer, or Breast cancer. ROS:   Please see the history of present illness.    All 14 point review of systems negative except as described per history of present illness.  EKGs/Labs/Other Studies Reviewed:    The following studies were reviewed today:   EKG:  EKG Interpretation Date/Time:  Monday February 17 2024 10:03:11 EST Ventricular Rate:  64 PR Interval:  176 QRS Duration:  84 QT Interval:  382 QTC Calculation: 394 R Axis:   2  Text Interpretation: Normal sinus rhythm Anterior infarct , age undetermined Abnormal ECG When compared with ECG of 13-Feb-2023 13:08, Anterior infarct is now Present T wave inversion now evident in Inferior leads Nonspecific T wave abnormality now evident in Anterolateral leads Confirmed by Liborio Hai reddy [phone]) on 02/17/2024 10:21:02 AM    Recent Labs: 06/25/2023: ALT 11; Magnesium  1.9; NT-Pro BNP  272 01/07/2024: BUN 11; Creatinine, Ser 0.98; Hemoglobin 12.8; Platelets 221.0; Potassium 3.8; Sodium 140  Recent Lipid Panel    Component Value Date/Time   CHOL 145 03/29/2021 1123   TRIG 212 (H) 03/29/2021 1123   HDL 39 (L) 03/29/2021 1123   CHOLHDL 3.7 03/29/2021 1123   LDLCALC 71 03/29/2021 1123    Physical Exam:    VS:  BP 138/88   Pulse 64   Ht 5' 5 (1.651 m)   Wt 159 lb 6.4 oz (72.3 kg)   SpO2 96%   BMI 26.53 kg/m     Wt Readings from Last 3 Encounters:  02/17/24 159 lb 6.4 oz (72.3 kg)  01/07/24 162 lb 3.2 oz (73.6 kg)  11/13/23 160 lb 9.6 oz (72.8 kg)     GENERAL:  Well nourished, well developed in no acute distress NECK: No JVD; No carotid bruits CARDIAC: RRR, S1 and S2 present, no murmurs, no rubs, no gallops CHEST:  Clear to auscultation without rales, wheezing or rhonchi  Extremities: No pitting pedal edema. Pulses bilaterally symmetric with radial 2+ and dorsalis pedis 2+ NEUROLOGIC:  Alert and oriented x 3  Medication Adjustments/Labs and Tests Ordered: Current medicines are reviewed at length with the patient today.  Concerns regarding medicines are outlined above.  Orders Placed This Encounter  Procedures   CT ANGIO CHEST AORTA W/CM & OR WO/CM   Comp Met (CMET)   Lipid Profile   AMB Referral to [facility] Pharm-D   EKG 12-Lead   Meds ordered this encounter  Medications   aspirin  EC 81 MG tablet  Sig: Take 1 tablet (81 mg total) by mouth 2 (two) times a week. Swallow whole.    Dispense:  90 tablet    Refill:  3    Signed, Gurjit Loconte reddy Taylor Spilde, MD, MPH, [facility]. 02/17/2024 10:54 AM    [facility]

## 2024-02-17 NOTE — Assessment & Plan Note (Signed)
 Be further evaluated with CTA chest abdomen.

## 2024-02-17 NOTE — Assessment & Plan Note (Signed)
 Remains asymptomatic. She self discontinued aspirin  about a month ago. Recommended given her underlying nonobstructive CAD to resume aspirin  81 mg at least 2 times a week.  She is agreeable to do this.  Did not tolerate statins in the past. See discussion under hyperlipidemia.

## 2024-02-17 NOTE — Patient Instructions (Signed)
 Medication Instructions:  Your physician recommends that you make the following medication changes: Start taking Aspirin  81 mg twice weekly.  Please refer to the Current Medication list given to you today.  *If you need a refill on your cardiac medications before your next appointment, please call your pharmacy*   Lab Work: CMP & lipids If you have labs (blood work) drawn today and your tests are completely normal, you will receive your results only by: MyChart Message (if you have MyChart) OR A paper copy in the mail If you have any lab test that is abnormal or we need to change your treatment, we will call you to review the results.   Testing/Procedures: CT-scan of the chest    Follow-Up: At [facility], you and your health needs are our priority.  As part of our continuing mission to provide you with exceptional heart care, we have created designated Provider Care Teams.  These Care Teams include your primary Cardiologist (physician) and Advanced Practice Providers (APPs -  Physician Assistants and Nurse Practitioners) who all work together to provide you with the care you need, when you need it.  We recommend signing up for the patient portal called MyChart.  Sign up information is provided on this After Visit Summary.  MyChart is used to connect with patients for Virtual Visits (Telemedicine).  Patients are able to view lab/test results, encounter notes, upcoming appointments, etc.  Non-urgent messages can be sent to your provider as well.   To learn more about what you can do with MyChart, go to [email or web address].    Your next appointment:   6 month(s)  The format for your next appointment:   In Person  Provider:   Alean Kobus, MD    Other Instructions none  Important Information About Sugar

## 2024-02-17 NOTE — Assessment & Plan Note (Signed)
 Near optimal. Continue with hydrochlorothiazide  and losartan .

## 2024-02-17 NOTE — Assessment & Plan Note (Signed)
 Mild aortic insufficiency associated with bicuspid aortic valve, last echocardiogram May 2025.  Will continue to monitor.

## 2024-02-17 NOTE — Assessment & Plan Note (Addendum)
 Repeat lipid panel today.  Did not tolerate statins in the past. Similarly was unable to tolerate Zetia  and PCSK9 inhibitors.  Leqvio there was concerns about cost.  She is willing to reconsider if affordable. Will refer to clinical pharmacist at our office to review candidacy for Leqvio and initiate the therapy if cost covered.

## 2024-02-18 LAB — COMPREHENSIVE METABOLIC PANEL WITH GFR
ALT: 8 IU/L (ref 0–32)
AST: 15 IU/L (ref 0–40)
Albumin: 4.3 g/dL (ref 3.8–4.8)
Alkaline Phosphatase: 92 IU/L (ref 49–135)
BUN/Creatinine Ratio: 15 (ref 12–28)
BUN: 13 mg/dL (ref 8–27)
Bilirubin Total: 0.4 mg/dL (ref 0.0–1.2)
CO2: 24 mmol/L (ref 20–29)
Calcium: 9.3 mg/dL (ref 8.7–10.3)
Chloride: 102 mmol/L (ref 96–106)
Creatinine, Ser: 0.86 mg/dL (ref 0.57–1.00)
Globulin, Total: 2 g/dL (ref 1.5–4.5)
Glucose: 89 mg/dL (ref 70–99)
Potassium: 4.5 mmol/L (ref 3.5–5.2)
Sodium: 139 mmol/L (ref 134–144)
Total Protein: 6.3 g/dL (ref 6.0–8.5)
eGFR: 70 mL/min/1.73 (ref >59)

## 2024-02-18 LAB — LIPID PANEL
Chol/HDL Ratio: 6.1 ratio — ABNORMAL HIGH (ref 0.0–4.4)
Cholesterol, Total: 251 mg/dL — ABNORMAL HIGH (ref 100–199)
HDL: 41 mg/dL (ref >39)
LDL Chol Calc (NIH): 178 mg/dL — ABNORMAL HIGH (ref 0–99)
Triglycerides: 171 mg/dL — ABNORMAL HIGH (ref 0–149)
VLDL Cholesterol Cal: 32 mg/dL (ref 5–40)

## 2024-02-24 DIAGNOSIS — M546 Pain in thoracic spine: Secondary | ICD-10-CM | POA: Diagnosis not present

## 2024-02-24 DIAGNOSIS — M542 Cervicalgia: Secondary | ICD-10-CM | POA: Diagnosis not present

## 2024-03-10 ENCOUNTER — Encounter (HOSPITAL_BASED_OUTPATIENT_CLINIC_OR_DEPARTMENT_OTHER): Payer: Self-pay | Admitting: Radiology

## 2024-03-10 ENCOUNTER — Ambulatory Visit (HOSPITAL_BASED_OUTPATIENT_CLINIC_OR_DEPARTMENT_OTHER)
Admission: RE | Admit: 2024-03-10 | Discharge: 2024-03-10 | Disposition: A | Discharge status: Home / Self Care | Source: Ambulatory Visit

## 2024-03-10 DIAGNOSIS — I714 Abdominal aortic aneurysm, without rupture, unspecified: Secondary | ICD-10-CM

## 2024-03-10 MED ORDER — IOHEXOL 350 MG/ML SOLN
80.0000 mL | Freq: Once | INTRAVENOUS | Status: AC | PRN
  Administered 2024-03-10: 80 mL via INTRAVENOUS

## 2024-03-30 ENCOUNTER — Other Ambulatory Visit: Payer: Self-pay

## 2024-03-30 ENCOUNTER — Telehealth: Payer: Self-pay

## 2024-03-30 ENCOUNTER — Ambulatory Visit: Payer: Self-pay

## 2024-03-30 MED ORDER — HYDROCHLOROTHIAZIDE 25 MG PO TABS: 25.0000 mg | ORAL_TABLET | Freq: Every day | ORAL | 2 refills | Status: AC

## 2024-03-30 NOTE — Telephone Encounter (Signed)
 Pt calling to f/u on labs and CT results that she had done last month. Please advise

## 2024-03-31 NOTE — Telephone Encounter (Signed)
 Called the patient and informed her of Dr. Madireddy's recommendation below:  Stable findings on CT chest. No significant increase in size of the aorta in the chest. No evidence of abdominal aortic aneurysm on prior images of CT abdomen. Blood work shows elevated cholesterol levels, normal kidney function and electrolytes.   Please recommend referral/follow-up with Clinic pharmacist to review options for Leqvio if cost affordable and covered for hyperlipidemia.  Patient verbalized understanding and had no further questions at this time. Attempted to explain about the referral to the clinical pharmacist and the patient stated that she did not want to take that medication at this time.

## 2024-04-23 ENCOUNTER — Other Ambulatory Visit: Payer: Self-pay | Admitting: Internal Medicine

## 2024-04-23 ENCOUNTER — Telehealth: Payer: Self-pay

## 2024-04-23 DIAGNOSIS — F419 Anxiety disorder, unspecified: Secondary | ICD-10-CM

## 2024-04-23 MED ORDER — LORAZEPAM 0.5 MG PO TABS: ORAL_TABLET | ORAL | 2 refills | Status: AC

## 2024-04-23 NOTE — Telephone Encounter (Signed)
 Pharmacy stated new script is needed on file with refills last refill used on 04/08/24   LORazepam  (ATIVAN ) 0.5 MG tablet  LOV 01/07/24 LRF 04/08/24 FOV 10/30/24

## 2024-10-30 ENCOUNTER — Ambulatory Visit
